# Patient Record
Sex: Male | Born: 1940
Health system: Southern US, Community
[De-identification: ages and names within clinical notes are randomized; demographics above are authoritative.]

## PROBLEM LIST (undated history)

## (undated) DIAGNOSIS — N200 Calculus of kidney: Secondary | ICD-10-CM

## (undated) DIAGNOSIS — I10 Essential (primary) hypertension: Secondary | ICD-10-CM

## (undated) DIAGNOSIS — E669 Obesity, unspecified: Secondary | ICD-10-CM

## (undated) DIAGNOSIS — M545 Low back pain, unspecified: Secondary | ICD-10-CM

## (undated) DIAGNOSIS — C649 Malignant neoplasm of unspecified kidney, except renal pelvis: Secondary | ICD-10-CM

## (undated) DIAGNOSIS — I454 Nonspecific intraventricular block: Secondary | ICD-10-CM

## (undated) DIAGNOSIS — D509 Iron deficiency anemia, unspecified: Secondary | ICD-10-CM

## (undated) DIAGNOSIS — I219 Acute myocardial infarction, unspecified: Secondary | ICD-10-CM

## (undated) DIAGNOSIS — Z860101 Personal history of adenomatous and serrated colon polyps: Secondary | ICD-10-CM

## (undated) DIAGNOSIS — J61 Pneumoconiosis due to asbestos and other mineral fibers: Secondary | ICD-10-CM

## (undated) DIAGNOSIS — F419 Anxiety disorder, unspecified: Secondary | ICD-10-CM

## (undated) DIAGNOSIS — E119 Type 2 diabetes mellitus without complications: Secondary | ICD-10-CM

## (undated) DIAGNOSIS — M5431 Sciatica, right side: Secondary | ICD-10-CM

## (undated) DIAGNOSIS — E785 Hyperlipidemia, unspecified: Secondary | ICD-10-CM

## (undated) DIAGNOSIS — I2510 Atherosclerotic heart disease of native coronary artery without angina pectoris: Secondary | ICD-10-CM

## (undated) DIAGNOSIS — M109 Gout, unspecified: Secondary | ICD-10-CM

## (undated) DIAGNOSIS — Z86010 Personal history of colonic polyps: Secondary | ICD-10-CM

## (undated) DIAGNOSIS — I214 Non-ST elevation (NSTEMI) myocardial infarction: Secondary | ICD-10-CM

## (undated) HISTORY — DX: Atherosclerotic heart disease of native coronary artery without angina pectoris: I25.10

## (undated) HISTORY — DX: Non-ST elevation (NSTEMI) myocardial infarction: I21.4

## (undated) HISTORY — PX: TONSILLECTOMY: SUR1361

## (undated) HISTORY — PX: APPENDECTOMY: SHX54

## (undated) HISTORY — DX: Pneumoconiosis due to asbestos and other mineral fibers: J61

## (undated) HISTORY — DX: Calculus of kidney: N20.0

## (undated) HISTORY — PX: BACK SURGERY: SHX140

## (undated) HISTORY — DX: Hyperlipidemia, unspecified: E78.5

## (undated) HISTORY — DX: Personal history of colonic polyps: Z86.010

## (undated) HISTORY — PX: LUMBAR DISC SURGERY: SHX700

## (undated) HISTORY — DX: Essential (primary) hypertension: I10

## (undated) HISTORY — DX: Low back pain: M54.5

## (undated) HISTORY — DX: Obesity, unspecified: E66.9

## (undated) HISTORY — DX: Iron deficiency anemia, unspecified: D50.9

## (undated) HISTORY — DX: Personal history of adenomatous and serrated colon polyps: Z86.0101

## (undated) HISTORY — PX: EYE SURGERY: SHX253

## (undated) HISTORY — PX: CHOLECYSTECTOMY: SHX55

## (undated) HISTORY — DX: Low back pain, unspecified: M54.50

---

## 1993-06-15 HISTORY — PX: TUMOR EXCISION: SHX421

## 1996-06-15 DIAGNOSIS — C649 Malignant neoplasm of unspecified kidney, except renal pelvis: Secondary | ICD-10-CM

## 1996-06-15 HISTORY — DX: Malignant neoplasm of unspecified kidney, except renal pelvis: C64.9

## 1996-06-15 HISTORY — PX: RETINAL DETACHMENT SURGERY: SHX105

## 2001-09-27 ENCOUNTER — Encounter: Payer: Self-pay | Admitting: Family Medicine

## 2001-09-27 ENCOUNTER — Ambulatory Visit (HOSPITAL_COMMUNITY): Admission: RE | Admit: 2001-09-27 | Discharge: 2001-09-27 | Payer: Self-pay | Admitting: Family Medicine

## 2002-03-07 ENCOUNTER — Ambulatory Visit (HOSPITAL_COMMUNITY): Admission: RE | Admit: 2002-03-07 | Discharge: 2002-03-07 | Payer: Self-pay | Admitting: General Surgery

## 2002-06-15 DIAGNOSIS — I219 Acute myocardial infarction, unspecified: Secondary | ICD-10-CM

## 2002-06-15 HISTORY — DX: Acute myocardial infarction, unspecified: I21.9

## 2002-08-21 ENCOUNTER — Encounter: Payer: Self-pay | Admitting: Emergency Medicine

## 2002-08-21 ENCOUNTER — Inpatient Hospital Stay (HOSPITAL_COMMUNITY): Admission: EM | Admit: 2002-08-21 | Discharge: 2002-08-25 | Payer: Self-pay | Admitting: Cardiology

## 2002-08-22 ENCOUNTER — Encounter: Payer: Self-pay | Admitting: Cardiology

## 2002-09-19 ENCOUNTER — Encounter (HOSPITAL_COMMUNITY): Admission: RE | Admit: 2002-09-19 | Discharge: 2002-10-19 | Payer: Self-pay | Admitting: Oncology

## 2002-09-19 ENCOUNTER — Encounter: Admission: RE | Admit: 2002-09-19 | Discharge: 2002-09-19 | Payer: Self-pay | Admitting: Oncology

## 2003-07-03 ENCOUNTER — Ambulatory Visit (HOSPITAL_COMMUNITY): Admission: RE | Admit: 2003-07-03 | Discharge: 2003-07-03 | Payer: Self-pay | Admitting: Cardiology

## 2004-01-15 ENCOUNTER — Observation Stay (HOSPITAL_COMMUNITY): Admission: RE | Admit: 2004-01-15 | Discharge: 2004-01-16 | Payer: Self-pay | Admitting: *Deleted

## 2004-03-14 ENCOUNTER — Inpatient Hospital Stay (HOSPITAL_COMMUNITY): Admission: EM | Admit: 2004-03-14 | Discharge: 2004-03-17 | Payer: Self-pay | Admitting: Emergency Medicine

## 2004-03-19 ENCOUNTER — Encounter (HOSPITAL_COMMUNITY): Admission: RE | Admit: 2004-03-19 | Discharge: 2004-03-19 | Payer: Self-pay | Admitting: Family Medicine

## 2004-03-26 ENCOUNTER — Ambulatory Visit (HOSPITAL_COMMUNITY): Admission: RE | Admit: 2004-03-26 | Discharge: 2004-03-26 | Payer: Self-pay | Admitting: General Surgery

## 2004-08-25 ENCOUNTER — Ambulatory Visit (HOSPITAL_COMMUNITY): Admission: RE | Admit: 2004-08-25 | Discharge: 2004-08-25 | Payer: Self-pay | Admitting: Orthopedic Surgery

## 2004-09-04 ENCOUNTER — Encounter: Admission: RE | Admit: 2004-09-04 | Discharge: 2004-09-04 | Payer: Self-pay | Admitting: Orthopedic Surgery

## 2004-10-27 ENCOUNTER — Observation Stay (HOSPITAL_COMMUNITY): Admission: RE | Admit: 2004-10-27 | Discharge: 2004-10-29 | Payer: Self-pay | Admitting: Orthopedic Surgery

## 2005-07-02 ENCOUNTER — Ambulatory Visit: Payer: Self-pay | Admitting: *Deleted

## 2005-07-09 ENCOUNTER — Ambulatory Visit: Payer: Self-pay | Admitting: *Deleted

## 2005-07-09 ENCOUNTER — Encounter (HOSPITAL_COMMUNITY): Admission: RE | Admit: 2005-07-09 | Discharge: 2005-08-08 | Payer: Self-pay | Admitting: *Deleted

## 2005-07-14 ENCOUNTER — Ambulatory Visit: Payer: Self-pay | Admitting: *Deleted

## 2007-01-19 ENCOUNTER — Ambulatory Visit: Payer: Self-pay | Admitting: Cardiology

## 2007-04-28 ENCOUNTER — Ambulatory Visit: Payer: Self-pay | Admitting: Cardiology

## 2007-05-02 ENCOUNTER — Ambulatory Visit: Payer: Self-pay | Admitting: Internal Medicine

## 2007-05-02 ENCOUNTER — Encounter (HOSPITAL_COMMUNITY): Admission: RE | Admit: 2007-05-02 | Discharge: 2007-06-01 | Payer: Self-pay | Admitting: Cardiology

## 2007-06-16 HISTORY — PX: COLONOSCOPY: SHX174

## 2007-06-16 HISTORY — PX: HOLMIUM LASER APPLICATION: SHX5852

## 2007-06-16 HISTORY — PX: CYSTOSCOPY W/ LITHOLAPAXY / EHL: SUR377

## 2007-08-23 ENCOUNTER — Ambulatory Visit (HOSPITAL_COMMUNITY): Admission: RE | Admit: 2007-08-23 | Discharge: 2007-08-23 | Payer: Self-pay | Admitting: General Surgery

## 2007-11-08 ENCOUNTER — Ambulatory Visit: Payer: Self-pay | Admitting: Internal Medicine

## 2007-11-22 ENCOUNTER — Ambulatory Visit (HOSPITAL_COMMUNITY): Admission: RE | Admit: 2007-11-22 | Discharge: 2007-11-22 | Payer: Self-pay | Admitting: Pulmonary Disease

## 2008-01-02 ENCOUNTER — Observation Stay (HOSPITAL_COMMUNITY): Admission: AD | Admit: 2008-01-02 | Discharge: 2008-01-03 | Payer: Self-pay | Admitting: Family Medicine

## 2008-01-02 ENCOUNTER — Ambulatory Visit (HOSPITAL_COMMUNITY): Admission: RE | Admit: 2008-01-02 | Discharge: 2008-01-02 | Payer: Self-pay | Admitting: Family Medicine

## 2008-01-11 ENCOUNTER — Ambulatory Visit (HOSPITAL_COMMUNITY): Admission: RE | Admit: 2008-01-11 | Discharge: 2008-01-11 | Payer: Self-pay | Admitting: Urology

## 2008-01-13 ENCOUNTER — Ambulatory Visit (HOSPITAL_COMMUNITY): Admission: RE | Admit: 2008-01-13 | Discharge: 2008-01-13 | Payer: Self-pay | Admitting: Urology

## 2008-01-19 ENCOUNTER — Observation Stay (HOSPITAL_COMMUNITY): Admission: EM | Admit: 2008-01-19 | Discharge: 2008-01-21 | Payer: Self-pay | Admitting: Emergency Medicine

## 2008-02-08 ENCOUNTER — Ambulatory Visit (HOSPITAL_COMMUNITY): Admission: RE | Admit: 2008-02-08 | Discharge: 2008-02-08 | Payer: Self-pay | Admitting: Urology

## 2008-07-16 ENCOUNTER — Encounter: Admission: RE | Admit: 2008-07-16 | Discharge: 2008-07-16 | Payer: Self-pay | Admitting: Orthopedic Surgery

## 2008-10-21 ENCOUNTER — Inpatient Hospital Stay (HOSPITAL_COMMUNITY): Admission: EM | Admit: 2008-10-21 | Discharge: 2008-10-23 | Payer: Self-pay | Admitting: Emergency Medicine

## 2008-10-23 ENCOUNTER — Encounter (INDEPENDENT_AMBULATORY_CARE_PROVIDER_SITE_OTHER): Payer: Self-pay | Admitting: Urology

## 2008-10-27 ENCOUNTER — Emergency Department (HOSPITAL_COMMUNITY): Admission: EM | Admit: 2008-10-27 | Discharge: 2008-10-28 | Payer: Self-pay | Admitting: Emergency Medicine

## 2008-10-31 DIAGNOSIS — M545 Low back pain, unspecified: Secondary | ICD-10-CM | POA: Insufficient documentation

## 2008-10-31 DIAGNOSIS — E785 Hyperlipidemia, unspecified: Secondary | ICD-10-CM

## 2008-11-02 ENCOUNTER — Ambulatory Visit: Payer: Self-pay | Admitting: Cardiology

## 2009-06-20 ENCOUNTER — Telehealth (INDEPENDENT_AMBULATORY_CARE_PROVIDER_SITE_OTHER): Payer: Self-pay | Admitting: *Deleted

## 2009-06-28 ENCOUNTER — Inpatient Hospital Stay (HOSPITAL_COMMUNITY): Admission: RE | Admit: 2009-06-28 | Discharge: 2009-06-30 | Payer: Self-pay | Admitting: Orthopedic Surgery

## 2009-08-15 ENCOUNTER — Encounter: Payer: Self-pay | Admitting: Cardiology

## 2009-08-27 ENCOUNTER — Ambulatory Visit: Payer: Self-pay | Admitting: Cardiology

## 2009-08-28 ENCOUNTER — Encounter: Payer: Self-pay | Admitting: Cardiology

## 2009-10-29 ENCOUNTER — Ambulatory Visit (HOSPITAL_COMMUNITY): Admission: RE | Admit: 2009-10-29 | Discharge: 2009-10-29 | Payer: Self-pay | Admitting: Family Medicine

## 2009-11-13 HISTORY — PX: COLONOSCOPY: SHX174

## 2009-11-13 HISTORY — PX: ESOPHAGOGASTRODUODENOSCOPY: SHX1529

## 2009-11-14 ENCOUNTER — Ambulatory Visit: Payer: Self-pay | Admitting: Internal Medicine

## 2009-11-14 DIAGNOSIS — R634 Abnormal weight loss: Secondary | ICD-10-CM

## 2009-11-14 DIAGNOSIS — D509 Iron deficiency anemia, unspecified: Secondary | ICD-10-CM

## 2009-11-14 DIAGNOSIS — R630 Anorexia: Secondary | ICD-10-CM

## 2009-11-14 DIAGNOSIS — J61 Pneumoconiosis due to asbestos and other mineral fibers: Secondary | ICD-10-CM | POA: Insufficient documentation

## 2009-11-21 ENCOUNTER — Ambulatory Visit: Payer: Self-pay | Admitting: Internal Medicine

## 2009-11-21 ENCOUNTER — Ambulatory Visit (HOSPITAL_COMMUNITY): Admission: RE | Admit: 2009-11-21 | Discharge: 2009-11-21 | Payer: Self-pay | Admitting: Internal Medicine

## 2009-11-28 ENCOUNTER — Encounter: Payer: Self-pay | Admitting: Urgent Care

## 2009-12-02 ENCOUNTER — Encounter: Payer: Self-pay | Admitting: Internal Medicine

## 2009-12-03 ENCOUNTER — Encounter: Payer: Self-pay | Admitting: Internal Medicine

## 2010-02-18 ENCOUNTER — Encounter (INDEPENDENT_AMBULATORY_CARE_PROVIDER_SITE_OTHER): Payer: Self-pay | Admitting: *Deleted

## 2010-03-04 ENCOUNTER — Ambulatory Visit: Payer: Self-pay | Admitting: Cardiology

## 2010-03-04 DIAGNOSIS — F17200 Nicotine dependence, unspecified, uncomplicated: Secondary | ICD-10-CM

## 2010-03-04 DIAGNOSIS — I4510 Unspecified right bundle-branch block: Secondary | ICD-10-CM

## 2010-05-15 ENCOUNTER — Encounter: Admission: RE | Admit: 2010-05-15 | Discharge: 2010-05-15 | Payer: Self-pay | Admitting: Orthopedic Surgery

## 2010-07-05 ENCOUNTER — Encounter: Payer: Self-pay | Admitting: *Deleted

## 2010-07-17 NOTE — Assessment & Plan Note (Signed)
Summary: rov. gd   Visit Type:  rov Primary Provider:  [name] MD  CC:  pt had back surgery in Jan....Marland Kitchenno cardiac complaints today..pt up 12 lb since last May.  History of Present Illness: Francisco Powell returns for evaluation and management of his coronary artery disease equivalent.   He is having no angina or ischemic symptoms. He's had back surgery and has been more active than usual.  His weight is stable. His diabetes and his hyperlipidemia are followed by his primary care.  His blood pressure good control. Last visit we stop his HCTZ because of kidney stones. He has not had any more stones. He takes magnesium.  Problems sleeping at night. His back incision has given him a lot of difficulty. He would like something to help him sleep.  Current Medications (verified): 1)  Metformin Hcl 1000 Mg Tabs (Metformin Hcl) .Marland Kitchen.. 1 Tab Two Times A Day 2)  Glipizide Xl 10 Mg Xr24h-Tab (Glipizide) .Marland Kitchen.. 1 Tab Two Times A Day 3)  Amlodipine Besylate 10 Mg Tabs (Amlodipine Besylate) .Marland Kitchen.. 1 Tab Once Daily 4)  Lisinopril 10 Mg Tabs (Lisinopril) .Marland Kitchen.. 1 Tab Once Daily 5)  Simvastatin 40 Mg Tabs (Simvastatin) .Marland Kitchen.. 1 Tab Once Daily 6)  Lantus 100 Unit/ml Soln (Insulin Glargine) .... As Directed 7)  Aspirin 81 Mg Tbec (Aspirin) .... Take One Tablet By Mouth Daily 8)  Magnesium Oxide 400 Mg Caps (Magnesium Oxide) .Marland Kitchen.. 1 Cap Once Daily  Allergies: 1)  ! Lipitor (Atorvastatin)  Past History:  Past Medical History: Last updated: 08/23/2009 CAD, NATIVE VESSEL (ICD-414.01) HYPERTENSION, UNSPECIFIED (ICD-401.9) HYPERLIPIDEMIA-MIXED (ICD-272.4) DM (ICD-250.00) OBESITY (ICD-278.00) BACK PAIN, LUMBAR (ICD-724.2)    Past Surgical History: Last updated: 10/31/2008 Holmium laser lithotripsy in 2009 Cholecystectomy Eye surgery.  Back surgery. Recurrent urolithiasis status post ESL  Family History: Last updated: 10/31/2008 Family History of Cancer:  Family History of Coronary Artery Disease:    Family History of Diabetes:  Family History of Hyperlipidemia:   Social History: Last updated: 10/31/2008 Tobacco Use - No.  Alcohol Use - no Drug Use - no  Risk Factors: Smoking Status: never (10/31/2008)  Review of Systems       negative other than history of present illness  Vital Signs:  Patient profile:   70 year old male Height:      74 inches Weight:      282 pounds BMI:     36.34 Pulse rate:   72 / minute Pulse rhythm:   regular BP sitting:   130 / 80  (left arm) Cuff size:   large  Vitals Entered By: Danielle Rankin, CMA (August 27, 2009 10:01 AM)  Physical Exam  General:  overweight but seems to have lost some Head:  normocephalic and atraumatic Mouth:  Teeth, gums and palate normal. Oral mucosa normal. Neck:  Neck supple, no JVD. No masses, thyromegaly or abnormal cervical nodes. Lungs:  Clear bilaterally to auscultation and percussion. Heart:  PMI heart appreciate, normal S1-S2, no murmur regular rate and rhythm Msk:  Back normal, normal gait. Muscle strength and tone normal. Pulses:  pulses normal in all 4 extremities Extremities:  No clubbing or cyanosis. Neurologic:  Alert and oriented x 3. Skin:  Intact without lesions or rashes. Psych:  Normal affect.   Impression & Recommendations:  Problem # 1:  CAD, NATIVE VESSEL (ICD-414.01) Assessment Unchanged  His updated medication list for this problem includes:    Amlodipine Besylate 10 Mg Tabs (Amlodipine besylate) .Marland Kitchen... 1 tab once daily  Lisinopril 10 Mg Tabs (Lisinopril) .Marland Kitchen... 1 tab once daily    Aspirin 81 Mg Tbec (Aspirin) .Marland Kitchen... Take one tablet by mouth daily  Problem # 2:  HYPERTENSION, UNSPECIFIED (ICD-401.9) Assessment: Improved  The following medications were removed from the medication list:    Hydrochlorothiazide 12.5 Mg Tabs (Hydrochlorothiazide) .Marland Kitchen... 1 tab every morning His updated medication list for this problem includes:    Amlodipine Besylate 10 Mg Tabs (Amlodipine besylate)  .Marland Kitchen... 1 tab once daily    Lisinopril 10 Mg Tabs (Lisinopril) .Marland Kitchen... 1 tab once daily    Aspirin 81 Mg Tbec (Aspirin) .Marland Kitchen... Take one tablet by mouth daily  Problem # 3:  HYPERLIPIDEMIA-MIXED (ICD-272.4)  His updated medication list for this problem includes:    Simvastatin 40 Mg Tabs (Simvastatin) .Marland Kitchen... 1 tab once daily  His updated medication list for this problem includes:    Simvastatin 40 Mg Tabs (Simvastatin) .Marland Kitchen... 1 tab once daily  Problem # 4:  DM (ICD-250.00)  His updated medication list for this problem includes:    Metformin Hcl 1000 Mg Tabs (Metformin hcl) .Marland Kitchen... 1 tab two times a day    Glipizide Xl 10 Mg Xr24h-tab (Glipizide) .Marland Kitchen... 1 tab two times a day    Lisinopril 10 Mg Tabs (Lisinopril) .Marland Kitchen... 1 tab once daily    Lantus 100 Unit/ml Soln (Insulin glargine) .Marland Kitchen... As directed    Aspirin 81 Mg Tbec (Aspirin) .Marland Kitchen... Take one tablet by mouth daily  His updated medication list for this problem includes:    Metformin Hcl 1000 Mg Tabs (Metformin hcl) .Marland Kitchen... 1 tab two times a day    Glipizide Xl 10 Mg Xr24h-tab (Glipizide) .Marland Kitchen... 1 tab two times a day    Lisinopril 10 Mg Tabs (Lisinopril) .Marland Kitchen... 1 tab once daily    Lantus 100 Unit/ml Soln (Insulin glargine) .Marland Kitchen... As directed    Aspirin 81 Mg Tbec (Aspirin) .Marland Kitchen... Take one tablet by mouth daily  Patient Instructions: 1)  Your physician recommends that you schedule a follow-up appointment in: 6 months with dr Shamar Engelmann 2)  Your physician recommends that you continue on your current medications as directed. Please refer to the Current Medication list given to you today. 3)  ZOLPIDEM TART 10 MG AT BEDTIME  Prescriptions: ZOLPIDEM TARTRATE 10 MG TABS (ZOLPIDEM TARTRATE) 1  at bedtime as needed  #30 x 1   Entered by:   Scherrie Bateman, LPN   Authorized by:   Gaylord Shih, MD, [facility]   Signed by:   Scherrie Bateman, LPN on 16/03/9603   Method used:   Print then Give to Patient   RxID:   [id number]

## 2010-07-17 NOTE — Letter (Signed)
Summary: TCS/EGD ORDER  TCS/EGD ORDER   Imported By: Ave Filter 11/14/2009 10:59:45  _____________________________________________________________________  External Attachment:    Type:   Image     Comment:   External Document

## 2010-07-17 NOTE — Progress Notes (Signed)
  Phone Note From Other Clinic   Caller: Pat/[facility] Details for Reason: Records Details of Action Taken: Faxed  Initial call taken by: Denny Peon    FAxed over to [phone], Pat @ [facility]  [facility]  June 20, 2009 1:51 PM

## 2010-07-17 NOTE — Assessment & Plan Note (Signed)
Summary: colitis,anemia,consult for tcs and egd/ss   Visit Type:  Initial Consult Referring Provider:  Dr. [name] Primary Care Provider:  Dr [name]  Chief Complaint:  weak, tired, anemic, and cant eat.  History of Present Illness: 70 y/o caucasian male referred by Dr Gerda Diss for IDA.  Approx 3 weeks ago, developed acute diarrheal illness and was told hgb 9 grams.  Started iron pills x 1 week.   6 day hx diarrhea w/ watery diarrhea TNTC, non-bloody.  CT abd/pelvis IV contrast 10/29/09-> ?mild thickening sig colon wall and accentuated vasa rectua vasculature.  Was treated w/?antibiotic x 1 week for UTI per  Dr Fletcher Anon office.  Pt was told he was heme negative.  Was taking imodium which helped diarrhea.  BM once daily now, formed.  Denies N/V/ abd pain.  Denies NSAIDs.  c/o anorexia and weakness.  Blood sugars ok. Using gatorade.  Weight loss 46# in 6 months, 9# in last 2 weeks.  Denies fever.  Did have HA.  Labs 11/12/09->iron 41, ferritin 12.  Hgb 11.3, esr 20, hgb a1c 9.3. Met 7 normal x glucose 253, LFTs normal  Current Problems (verified): 1)  Anemia, Iron Deficiency  (ICD-280.9) 2)  Weight Loss, Abnormal  (ICD-783.21) 3)  Anorexia  (ICD-783.0) 4)  Pulmonary Asbestosis  (ICD-501) 5)  Cad, Native Vessel  (ICD-414.01) 6)  Hypertension, Unspecified  (ICD-401.9) 7)  Hyperlipidemia-mixed  (ICD-272.4) 8)  Dm  (ICD-250.00) 9)  Obesity  (ICD-278.00) 10)  Back Pain, Lumbar  (ICD-724.2)  Current Medications (verified): 1)  Metformin Hcl 1000 Mg Tabs (Metformin Hcl) .Marland Kitchen.. 1 Tab Two Times A Day 2)  Glipizide Xl 10 Mg Xr24h-Tab (Glipizide) .Marland Kitchen.. 1 Tab Two Times A Day 3)  Amlodipine Besylate 10 Mg Tabs (Amlodipine Besylate) .Marland Kitchen.. 1 Tab Once Daily 4)  Lisinopril 10 Mg Tabs (Lisinopril) .Marland Kitchen.. 1 Tab Once Daily 5)  Simvastatin 40 Mg Tabs (Simvastatin) .Marland Kitchen.. 1 Tab Once Daily 6)  Lantus 100 Unit/ml Soln (Insulin Glargine) .... As Directed  Allergies (verified): 1)  ! Lipitor  (Atorvastatin)  Past History:  Past Medical History: CAD, NATIVE VESSEL (ICD-414.01) HYPERTENSION, UNSPECIFIED (ICD-401.9) HYPERLIPIDEMIA-MIXED (ICD-272.4) DM (ICD-250.00) OBESITY (ICD-278.00) BACK PAIN, LUMBAR (ICD-724.2) renal lithiasis asbestosis lung hx colon adenomatous polyps on 2 colonoscopies in 1990s Dr Rehman/Jenkins [place]  ANEMIA, IRON DEFICIENCY (ICD-280.9)  Past Surgical History: Holmium laser lithotripsy in 2009 Cholecystectomy sec biliary dyskinesia Eye surgery secondary to injury detached retina/cataracts Back surgery. Recurrent urolithiasis status post ESL Appendectomy  Family History: Mother dx colon ca age 14 Maternal grandmother gastric ca age 76 sister breast Ca  Social History: Tobacco Use - yes. 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 CV:  Denies chest pains, angina, palpitations, syncope, dyspnea on exertion, orthopnea, PND, peripheral edema, and claudication. Resp:  Denies dyspnea at rest, dyspnea with exercise, cough, sputum, wheezing, coughing up blood, and pleurisy. GI:  See HPI; Denies jaundice, bloody BM's, black BMs, and fecal incontinence. GU:  Denies urinary burning, blood in urine, urinary frequency, urinary hesitancy, nocturnal urination, and urinary incontinence. Derm:  Denies rash, itching, dry skin, hives, moles, warts, and unhealing ulcers. Psych:  Denies depression, anxiety, memory loss, suicidal ideation, hallucinations, paranoia, phobia, and confusion. Heme:  Denies bruising, bleeding, and enlarged lymph nodes.  Vital Signs:  Patient profile:  70 year old male Height:      74 inches Weight:      274 pounds BMI:     35.31 Temp:     97.6 degrees F oral Pulse rate:   80 / minute BP sitting:   128 / 72  (left  arm) Cuff size:   large  Vitals Entered By: Hendricks Limes LPN (November 15, 5407 10:01 AM)  Physical Exam  General:  obese.  Well developed, no acute distress. Accompanied by wife and daughter. Head:  Normocephalic and atraumatic. Eyes:  Sclera clear, no icterus. Ears:  Normal auditory acuity. Nose:  No deformity, discharge,  or lesions. Mouth:  No deformity or lesions, dentition normal. Neck:  Supple; no masses or thyromegaly. Lungs:  Clear throughout to auscultation. Heart:  Regular rate and rhythm; no murmurs, rubs,  or bruits. Abdomen:  Obese Soft, nontender and nondistended. No masses, hepatosplenomegaly or hernias noted. Normal bowel sounds.without guarding and without rebound.   Exam limited given pt body habitus. Rectal:  deferred until time of colonoscopy.   Msk:  Symmetrical with no gross deformities. Normal posture. Pulses:  Normal pulses noted. Extremities:  trace pedal edema.   Neurologic:  Alert and  oriented x4;  grossly normal neurologically. Skin:  Intact without significant lesions or rashes. Cervical Nodes:  No significant cervical adenopathy. Inguinal Nodes:  No significant inguinal adenopathy. Psych:  Alert and cooperative. Normal mood and affect.  Impression & Recommendations:  Problem # 1:  ANEMIA, IRON DEFICIENCY (ICD-280.9) 70 y/o caucasian male recently found to have iron deficiency anemia on bloodwork during evaluation for acute non-bloody diarrheal illness ?viral vs. bacterial.  Nonetheless, diarrhea has resolved.  CT showed thickening sigmoid colon.  Weight loss and anemia is concerning.  We do need to r/o colorectal carcinoma, polyps, inflammatory bowel disease.  Family hx positive for CRCA and gastric CA.  He also has early satiety,anorexia, and significant weight loss, therefore, he will undergo EGD at the same time as colonoscopy to r/o PUD, gastritis, & malignancy.  Diagnostic colonoscopy and EGD to be performed by Dr. Suszanne Conners Rourk in the near future.   I have discussed risks and benefits which include, but are not limited to, bleeding, infection, perforation, or medication reaction.  The patient agrees with this plan and consent will be obtained.  Orders: Consultation Level III (81191)  Problem # 2:  ANOREXIA (ICD-783.0) See #1  Problem # 3:  WEIGHT LOSS, ABNORMAL (ICD-783.21) See #1  Problem # 4:  DM (ICD-250.00) Meds to be adjusted for procedures  Problem # 5:  COLITIS, HX OF (ICD-V12.79) See #1  Patient Instructions: 1)  Take 40 untis of lantus night before procedure 2)  Check blood sugars frequently day before procedure, if any problems, call us or Dr Gerda Diss 3)  Hold diabetes pills day of procedure, may resume after procedure 4)  Hold iron for 1 week before procedure 5)  Low fat/low chol high iron diet 6)  Begin multivitamin daily 7)  Call if any problems in interim 8)  The medication list was reviewed and reconciled.  All changed / newly prescribed medications were explained.  A complete medication list was provided to the patient / caregiver.

## 2010-07-17 NOTE — Letter (Signed)
Summary: Recall Office Visit  [facility]  [address]   [place], [place] [address]   Phone: [phone]  Fax: [phone]      February 18, 2010   Francisco Powell [address] [place], [place]  [address] 1940/10/03   Dear [name],   According to our records, it is time for you to schedule a follow-up office visit with [place].   At your convenience, please call [phone] to schedule an office visit. If you have any questions, concerns, or feel that this letter is in error, we would appreciate your call.   Sincerely,    Diana Eves  [facility] Ph: [phone]   Fax: [phone]

## 2010-07-17 NOTE — Assessment & Plan Note (Signed)
Summary: 6 mo f/u   Visit Type:  6 mo f/u Referring Provider:  Dr. [name] Primary Provider:  Dr [name]  CC:  right hip pain...denies any other complaints today.  History of Present Illness: Francisco Powell comes in today for followup.  Since I last saw him he had back surgery in January and has improved his back pain. He also had H. pylori gastritishas recovered after 6 weeks of antibiotics.  His blood sugars are still running high. His weight is increasing.  He denies any angina or ischemic symptoms.  His blood pressure 200/30 good control.  Blood work from Dr.Lukings office reviewed.  A low risk stress Myoview in 2008. He has subclinical coronary disease based on his diabetes.  Clinical Reports Reviewed:  Nuclear Study:  05/02/2007:    Clinical data:   Patient is a 70 year old with chest pain.  Test to   evaluate.   STRESS MYOVIEW STUDY:   Patient underwent Adenosine stress testing.  Baseline blood pressure   was 138/68.  Baseline EKG showed sinus bradycardia with occasional   PVC, 56 beats per minute.  Nonspecific ST changes.   With the infusion of Adenosine, the patient experienced no chest   pain.  EKG showed no ST changes to suggest ischemia.   NUCLEAR DATA:  Patient was studied in a 1 day rest/stress protocol.   He was injected with 10.0 mCi Tc81m Myoview at rest, 30.0 mCi Tc94m   Myoview at stress.  Images were reconstructed in the short, vertical,   and horizontal axes.   On the initial stress images, there was thinning with decreased   tracer counts mildly in the inferior wall (base mid distal),   inferolateral wall (base mid distal and apex).   Otherwise, there was normal perfusion.   In the recovery images, there was no significant change in this.   On gating, LVF was calculated at 51% with some septal hypokinesis.   Note the LV appeared to be somewhat dilated.   On review of the raw data, the inferior inferolateral changes most   likely reflect  soft tissue attenuation.  No evidence for inducible   ischemia or definite scar.   IMPRESSION:   Adenosine Myoview electrically negative for ischemia.  Myoview scan   with probable normal perfusion and soft tissue attenuation (diaphragm   inferior/inferolaterally).  No evidence of inducible ischemia.   Consider echocardiogram to evaluate chamber sizes and further define   ejection fraction.    Read By:  Pricilla Riffle,  M.D.   Released By:  Pricilla Riffle,  M.D.  07/04/2003:   Clinical data:   [name] is a 70 year old male with known   coronary disease, had a catheterization in March which showed   moderate disease which was nonobstructive and normal left ventricular   function.  He had an adenosine Cardiolite after that which showed no   ischemia.  He now returns with an episode of discomfort in his chest   which was relieved by nitroglycerin.   MYOCARDIAL PERFUSION IMAGING STUDY   20.0 mCi Tc30m Cardiolite was injected at rest.  30.0 mCi Tc7m was   injected at peak pharmacologic stress.  60 mg of Adenosine was   infused over a four minute protocol.   DETAILS OF THE PROCEDURE:  The patient underwent an infusion of   adenosine and at peak pharmacologic stress, high dose Technetium 76m   Cardiolite was infused.  SPECT imaging of the chest was performed.  The patient then was discharged to home and came back 24 hours later   and had low dose Technetium 40m Cardiolite infused.  SPECT imaging of   the chest was performed.  The images were recreated in the short   axis, horizontal long, and vertical long axes, and the post stress   images were used in a gating protocol to assess left ventricular   size, wall motion, and function.   RESULTS:  There is uniform perfusion throughout all myocardial   segments.  There is mild diaphragmatic attenuation.  The overall   ejection fraction is 57%.  There is no ischemia seen.  There are no   wall motion abnormalities seen.   STRESS TEST:  The  patient was infused with adenosine over a 4 minute   protocol.  His heart rate went from 62 beats a minute to 77 beats a   minute.  His blood pressure 132/80 to 140/78.  During that time, he   experienced chest tightness, flushing, which resolved with the   cessation of the infusion.  He had no arrhythmia or ST-T wave changes   concerning for ischemia.   IMPRESSION   This is a low risk scan in a patient with known coronary artery   disease.  Clinical correlation is advised.   CC:  Dr. [name]    Read By:  Vida Roller,   Current Medications (verified): 1)  Metformin Hcl 1000 Mg Tabs (Metformin Hcl) .Marland Kitchen.. 1 Tab Two Times A Day 2)  Glipizide Xl 10 Mg Xr24h-Tab (Glipizide) .Marland Kitchen.. 1 Tab Two Times A Day 3)  Amlodipine Besylate 10 Mg Tabs (Amlodipine Besylate) .Marland Kitchen.. 1 Tab Once Daily 4)  Lisinopril 10 Mg Tabs (Lisinopril) .Marland Kitchen.. 1 Tab Once Daily 5)  Pravastatin Sodium 40 Mg Tabs (Pravastatin Sodium) .Marland Kitchen.. 1 Once Daily 6)  Lantus 100 Unit/ml Soln (Insulin Glargine) .... As Directed 7)  Aspirin 81 Mg Tbec (Aspirin) .Marland Kitchen.. 1 Once Daily  Allergies: 1)  ! Lipitor (Atorvastatin)  Past History:  Past Medical History: Last updated: 11/14/2009 CAD, NATIVE VESSEL (ICD-414.01) HYPERTENSION, UNSPECIFIED (ICD-401.9) HYPERLIPIDEMIA-MIXED (ICD-272.4) DM (ICD-250.00) OBESITY (ICD-278.00) BACK PAIN, LUMBAR (ICD-724.2) renal lithiasis asbestosis lung hx colon adenomatous polyps on 2 colonoscopies in 1990s Dr Rehman/Jenkins [place]  ANEMIA, IRON DEFICIENCY (ICD-280.9)  Past Surgical History: Last updated: 11/14/2009 Holmium laser lithotripsy in 2009 Cholecystectomy sec biliary dyskinesia Eye surgery secondary to injury detached retina/cataracts Back surgery. Recurrent urolithiasis status post ESL Appendectomy  Family History: Last updated: 11/14/2009 Mother dx colon ca age 61 Maternal grandmother gastric ca age 62 sister breast Ca  Social History: Last updated: 11/14/2009 Tobacco Use -  yes. 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 Neck:  Neck supple, no JVD. No  masses, thyromegaly or abnormal cervical nodes. Chest Wall:  no deformities or breast masses noted Lungs:  Clear bilaterally to auscultation and percussion. Heart:  PMI poorly appreciated, normal S1-S2, no murmur. Retrocecal bilateral bruits Msk:  Back normal, normal gait. Muscle strength and tone normal. Pulses:  pulses normal in all 4 extremities Extremities:  No clubbing or cyanosis. Neurologic:  Alert and oriented x 3. Skin:  Intact without lesions or rashes. Psych:  Normal affect.   Problems:  Medical Problems Added: 1)  Dx of Rbbb  (ICD-426.4) 2)  Dx of Smokeless Tobacco Abuse  (ICD-305.1)  EKG  Procedure date:  03/04/2010  Findings:      normal sinus rhythm, right bundle branch block, no acute changes.  Impression & Recommendations:  Problem # 1:  CAD, NATIVE VESSEL (ICD-414.01) Assessment Unchanged  His updated medication list for this problem includes:    Amlodipine Besylate 10 Mg Tabs (Amlodipine besylate) .Marland Kitchen... 1 tab once daily    Lisinopril 10 Mg Tabs (Lisinopril) .Marland Kitchen... 1 tab once daily    Aspirin 81 Mg Tbec (Aspirin) .Marland Kitchen... 1 once daily  Problem # 2:  HYPERTENSION, UNSPECIFIED (ICD-401.9) Assessment: Improved  His updated medication list for  this problem includes:    Amlodipine Besylate 10 Mg Tabs (Amlodipine besylate) .Marland Kitchen... 1 tab once daily    Lisinopril 10 Mg Tabs (Lisinopril) .Marland Kitchen... 1 tab once daily    Aspirin 81 Mg Tbec (Aspirin) .Marland Kitchen... 1 once daily  Orders: EKG w/ Interpretation (93000)  Problem # 3:  HYPERLIPIDEMIA-MIXED (ICD-272.4)  His updated medication list for this problem includes:    Pravastatin Sodium 40 Mg Tabs (Pravastatin sodium) .Marland Kitchen... 1 once daily  Problem # 4:  OBESITY (ICD-278.00) Assessment: Deteriorated I have advised him to keep his weight down his sugar tight. This is his greatest risk 4 having health problems in the future. Wife is present he does the cooking. He agreed to cut his portions and her to cook more healthy.  Problem # 5:  DM (ICD-250.00)  His updated medication list for this problem includes:    Metformin Hcl 1000 Mg Tabs (Metformin hcl) .Marland Kitchen... 1 tab two times a day    Glipizide Xl 10 Mg Xr24h-tab (Glipizide) .Marland Kitchen... 1 tab two times a day    Lisinopril 10 Mg Tabs (Lisinopril) .Marland Kitchen... 1 tab once daily    Lantus 100 Unit/ml Soln (Insulin glargine) .Marland Kitchen... As directed    Aspirin 81 Mg Tbec (Aspirin) .Marland Kitchen... 1 once daily  Problem # 6:  BACK PAIN, LUMBAR (ICD-724.2) Assessment: Improved  Problem # 7:  SMOKELESS TOBACCO ABUSE (ICD-305.1) Assessment: Unchanged  advised to quit  Problem # 8:  RBBB (ICD-426.4)  His updated medication list for this problem includes:    Amlodipine Besylate 10 Mg Tabs (Amlodipine besylate) .Marland Kitchen... 1 tab once daily    Lisinopril 10 Mg Tabs (Lisinopril) .Marland Kitchen... 1 tab once daily    Aspirin 81 Mg Tbec (Aspirin) .Marland Kitchen... 1 once daily  His updated medication list for this problem includes:    Amlodipine Besylate 10 Mg Tabs (Amlodipine besylate) .Marland Kitchen... 1 tab once daily    Lisinopril 10 Mg Tabs (Lisinopril) .Marland Kitchen... 1 tab once daily    Aspirin 81 Mg Tbec (Aspirin) .Marland Kitchen... 1 once daily  Patient Instructions: 1)  Your physician recommends that you schedule a follow-up appointment in:  6 MONTHS WITH DR WALL 2)  Your physician recommends that you continue on your current medications as directed. Please refer to the Current Medication list given to you today.

## 2010-07-18 NOTE — Letter (Signed)
Summary: LABS  LABS   Imported By: Ave Filter 12/03/2009 11:06:54  _____________________________________________________________________  External Attachment:    Type:   Image     Comment:   External Document

## 2010-07-18 NOTE — Op Note (Signed)
Summary: TCS-DR Lovell Sheehan  TCS-DR JENKINS   Imported By: Ave Filter 11/28/2009 13:19:01  _____________________________________________________________________  External Attachment:    Type:   Image     Comment:   External Document

## 2010-08-18 ENCOUNTER — Encounter: Payer: Self-pay | Admitting: Cardiology

## 2010-08-18 ENCOUNTER — Ambulatory Visit (INDEPENDENT_AMBULATORY_CARE_PROVIDER_SITE_OTHER): Payer: Medicare Other | Admitting: Cardiology

## 2010-08-18 DIAGNOSIS — I2510 Atherosclerotic heart disease of native coronary artery without angina pectoris: Secondary | ICD-10-CM

## 2010-08-18 DIAGNOSIS — I10 Essential (primary) hypertension: Secondary | ICD-10-CM

## 2010-08-26 NOTE — Assessment & Plan Note (Signed)
Summary: 6 mo fu cy/ep   Visit Type:  6 mo follow up Referring Provider:  Dr. [name] Primary Provider:  Dr [name]  CC:  pt has no complaints today.  History of Present Illness: Francisco Powell returns for E and M of subclinical  coronary artery disease and HTN. He denies any angina or ischemic equivalents. He is having problems with his low back requiring injections He denies any sxs of CHF.  Current Medications (verified): 1)  Metformin Hcl 1000 Mg Tabs (Metformin Hcl) .Marland Kitchen.. 1 Tab Two Times A Day 2)  Glipizide Xl 10 Mg Xr24h-Tab (Glipizide) .Marland Kitchen.. 1 Tab Two Times A Day 3)  Amlodipine Besylate 10 Mg Tabs (Amlodipine Besylate) .Marland Kitchen.. 1 Tab Once Daily 4)  Lisinopril 10 Mg Tabs (Lisinopril) .Marland Kitchen.. 1 Tab Once Daily 5)  Pravastatin Sodium 40 Mg Tabs (Pravastatin Sodium) .Marland Kitchen.. 1 Once Daily 6)  Lantus 100 Unit/ml Soln (Insulin Glargine) .... As Directed 7)  Aspirin 81 Mg Tbec (Aspirin) .Marland Kitchen.. 1 Once Daily  Allergies (verified): 1)  ! Lipitor (Atorvastatin)  Past History:  Past Medical History: Last updated: 11/14/2009 CAD, NATIVE VESSEL (ICD-414.01) HYPERTENSION, UNSPECIFIED (ICD-401.9) HYPERLIPIDEMIA-MIXED (ICD-272.4) DM (ICD-250.00) OBESITY (ICD-278.00) BACK PAIN, LUMBAR (ICD-724.2) renal lithiasis asbestosis lung hx colon adenomatous polyps on 2 colonoscopies in 1990s Dr Rehman/Jenkins [place]  ANEMIA, IRON DEFICIENCY (ICD-280.9)  Past Surgical History: Last updated: 11/14/2009 Holmium laser lithotripsy in 2009 Cholecystectomy sec biliary dyskinesia Eye surgery secondary to injury detached retina/cataracts Back surgery. Recurrent urolithiasis status post ESL Appendectomy  Family History: Last updated: 11/14/2009 Mother dx colon ca age 44 Maternal grandmother gastric ca age 41 sister breast Ca  Social History: Last updated: 11/14/2009 Tobacco Use - yes. 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  obese.   Head:  normocephalic and atraumatic Eyes:  PERRLA/EOM intact; conjunctiva and lids normal. Neck:  Neck supple, no JVD. No masses, thyromegaly or abnormal cervical nodes. Chest Alvy Alsop:  no deformities or breast masses noted Lungs:  Clear bilaterally to auscultation and percussion. Heart:  RRR, Nl S1 and S2, no bruits Msk:  decreased ROM.  decreased ROM.   Pulses:  pulses normal in all 4 extremities Extremities:  No clubbing or cyanosis. Neurologic:  Alert and oriented x 3. Skin:  Intact without lesions or rashes. Psych:  Normal affect.   Impression & Recommendations:  Problem # 1:  RBBB (ICD-426.4) Assessment Unchanged  His updated medication list for this problem includes:    Amlodipine Besylate 10 Mg Tabs (Amlodipine besylate) .Marland Kitchen... 1 tab once daily    Lisinopril 20 Mg Tabs (Lisinopril) .Marland Kitchen... Take one tablet by mouth daily    Aspirin 81 Mg Tbec (Aspirin) .Marland Kitchen... 1 once daily  His updated medication list for this problem includes:    Amlodipine Besylate 10 Mg Tabs (Amlodipine besylate) .Marland KitchenMarland KitchenMarland KitchenMarland Kitchen  1 tab once daily    Lisinopril 20 Mg Tabs (Lisinopril) .Marland Kitchen... Take one tablet by mouth daily    Aspirin 81 Mg Tbec (Aspirin) .Marland Kitchen... 1 once daily  Problem # 2:  CAD, NATIVE VESSEL (ICD-414.01) Assessment: Unchanged  His updated medication list for this problem includes:    Amlodipine Besylate 10 Mg Tabs (Amlodipine besylate) .Marland Kitchen... 1 tab once daily    Lisinopril 20 Mg Tabs (Lisinopril) .Marland Kitchen... Take one tablet by mouth daily    Aspirin 81 Mg Tbec (Aspirin)  .Marland Kitchen... 1 once daily  Orders: EKG w/ Interpretation (93000)  Orders: EKG w/ Interpretation (93000)  Problem # 3:  HYPERTENSION, UNSPECIFIED (ICD-401.9) Assessment: Deteriorated Increase Lisinopril to 20mg /day. BMET in 1 week. His updated medication list for this problem includes:    Amlodipine Besylate 10 Mg Tabs (Amlodipine besylate) .Marland Kitchen... 1 tab once daily    Lisinopril 20 Mg Tabs (Lisinopril) .Marland Kitchen... Take one tablet by mouth daily    Aspirin 81 Mg Tbec (Aspirin) .Marland Kitchen... 1 once daily  Patient Instructions: 1)  Your physician recommends that you schedule a follow-up appointment in: 6 months 2)  Your physician has recommended you make the following change in your medication: Increase Lisinopril to 20 mg by mouth daily. 3)  Have lab work drawn in [place] on August 28, 2010--BMP. You have prescription for this Prescriptions: LISINOPRIL 20 MG TABS (LISINOPRIL) Take one tablet by mouth daily  #90 x 3   Entered by:   Dossie Arbour, RN, BSN   Authorized by:   Gaylord Shih, MD, [facility]   Signed by:   Dossie Arbour, RN, BSN on 08/18/2010   Method used:   Electronically to        [facility] (retail)       [address]       [place], [place]  [address]       Ph: [phone]       Fax: [phone]   RxID:   [id number]

## 2010-08-28 ENCOUNTER — Encounter: Payer: Self-pay | Admitting: Cardiology

## 2010-08-31 LAB — COMPREHENSIVE METABOLIC PANEL
ALT: 26 U/L (ref 0–53)
AST: 32 U/L (ref 0–37)
Albumin: 3.9 g/dL (ref 3.5–5.2)
Alkaline Phosphatase: 66 U/L (ref 39–117)
GFR calc Af Amer: >60 mL/min (ref >60)
Potassium: 4.1 mEq/L (ref 3.5–5.1)
Sodium: 142 mEq/L (ref 135–145)
Total Protein: 7.1 g/dL (ref 6.0–8.3)

## 2010-08-31 LAB — GLUCOSE, CAPILLARY
Glucose-Capillary: 117 mg/dL — ABNORMAL HIGH (ref 70–99)
Glucose-Capillary: 119 mg/dL — ABNORMAL HIGH (ref 70–99)
Glucose-Capillary: 130 mg/dL — ABNORMAL HIGH (ref 70–99)
Glucose-Capillary: 135 mg/dL — ABNORMAL HIGH (ref 70–99)
Glucose-Capillary: 152 mg/dL — ABNORMAL HIGH (ref 70–99)
Glucose-Capillary: 152 mg/dL — ABNORMAL HIGH (ref 70–99)
Glucose-Capillary: 157 mg/dL — ABNORMAL HIGH (ref 70–99)
Glucose-Capillary: 163 mg/dL — ABNORMAL HIGH (ref 70–99)
Glucose-Capillary: 174 mg/dL — ABNORMAL HIGH (ref 70–99)
Glucose-Capillary: 207 mg/dL — ABNORMAL HIGH (ref 70–99)
Glucose-Capillary: 192 mg/dL — ABNORMAL HIGH (ref 70–99)

## 2010-09-01 LAB — GLUCOSE, CAPILLARY: Glucose-Capillary: 160 mg/dL — ABNORMAL HIGH (ref 70–99)

## 2010-09-02 ENCOUNTER — Telehealth: Payer: Self-pay | Admitting: Cardiology

## 2010-09-02 LAB — CONVERTED CEMR LAB
BUN: 14 mg/dL (ref 6–23)
Calcium: 9.3 mg/dL (ref 8.4–10.5)
Glucose, Bld: 172 mg/dL — ABNORMAL HIGH (ref 70–99)
Potassium: 4.8 meq/L (ref 3.5–5.3)

## 2010-09-02 NOTE — Telephone Encounter (Signed)
Lab results given to wife.  Also aware results were faxed to pcp.

## 2010-09-23 LAB — DIFFERENTIAL
Basophils Relative: 1 % (ref 0–1)
Eosinophils Absolute: 0 10*3/uL (ref 0.0–0.7)
Eosinophils Relative: 0 % (ref 0–5)
Lymphocytes Relative: 29 % (ref 12–46)
Lymphs Abs: 3.2 10*3/uL (ref 0.7–4.0)
Lymphs Abs: 2 10*3/uL (ref 0.7–4.0)
Monocytes Absolute: 1 10*3/uL (ref 0.1–1.0)
Monocytes Relative: 9 % (ref 3–12)
Monocytes Relative: 8 % (ref 3–12)
Neutro Abs: 6.7 10*3/uL (ref 1.7–7.7)
Neutrophils Relative %: 60 % (ref 43–77)

## 2010-09-23 LAB — URINE MICROSCOPIC-ADD ON

## 2010-09-23 LAB — BASIC METABOLIC PANEL WITH GFR
BUN: 21 mg/dL (ref 6–23)
CO2: 25 meq/L (ref 19–32)
Calcium: 9.3 mg/dL (ref 8.4–10.5)
Chloride: 106 meq/L (ref 96–112)
Creatinine, Ser: 1.28 mg/dL (ref 0.4–1.5)
GFR calc Af Amer: >60 mL/min (ref >60)
GFR calc non Af Amer: 56 mL/min — ABNORMAL LOW (ref >60)
Glucose, Bld: 179 mg/dL — ABNORMAL HIGH (ref 70–99)
Potassium: 4.5 meq/L (ref 3.5–5.1)
Sodium: 139 meq/L (ref 135–145)

## 2010-09-23 LAB — URINALYSIS, ROUTINE W REFLEX MICROSCOPIC
Bilirubin Urine: NEGATIVE
Glucose, UA: 250 mg/dL — AB
Leukocytes, UA: NEGATIVE
Protein, ur: NEGATIVE mg/dL
Specific Gravity, Urine: 1.005 (ref 1.005–1.030)
Urobilinogen, UA: 0.2 mg/dL (ref 0.0–1.0)
Urobilinogen, UA: 0.2 mg/dL (ref 0.0–1.0)
pH: 6 (ref 5.0–8.0)

## 2010-09-23 LAB — STONE ANALYSIS: Stone Weight KSTONE: 0.044 g

## 2010-09-23 LAB — GLUCOSE, CAPILLARY
Glucose-Capillary: 142 mg/dL — ABNORMAL HIGH (ref 70–99)
Glucose-Capillary: 169 mg/dL — ABNORMAL HIGH (ref 70–99)
Glucose-Capillary: 177 mg/dL — ABNORMAL HIGH (ref 70–99)
Glucose-Capillary: 182 mg/dL — ABNORMAL HIGH (ref 70–99)
Glucose-Capillary: 189 mg/dL — ABNORMAL HIGH (ref 70–99)
Glucose-Capillary: 210 mg/dL — ABNORMAL HIGH (ref 70–99)
Glucose-Capillary: 219 mg/dL — ABNORMAL HIGH (ref 70–99)
Glucose-Capillary: 225 mg/dL — ABNORMAL HIGH (ref 70–99)
Glucose-Capillary: 225 mg/dL — ABNORMAL HIGH (ref 70–99)

## 2010-09-23 LAB — BASIC METABOLIC PANEL
CO2: 28 mEq/L (ref 19–32)
Calcium: 9.9 mg/dL (ref 8.4–10.5)
Creatinine, Ser: 1.18 mg/dL (ref 0.4–1.5)
GFR calc Af Amer: >60 mL/min (ref >60)
GFR calc non Af Amer: >60 mL/min (ref >60)
Sodium: 140 mEq/L (ref 135–145)

## 2010-09-23 LAB — CBC
HCT: 38.1 % — ABNORMAL LOW (ref 39.0–52.0)
Hemoglobin: 13.1 g/dL (ref 13.0–17.0)
MCHC: 33.4 g/dL (ref 30.0–36.0)
MCHC: 32.9 g/dL (ref 30.0–36.0)
MCV: 80.8 fL (ref 78.0–100.0)
RBC: 4.87 MIL/uL (ref 4.22–5.81)
RBC: 4.71 MIL/uL (ref 4.22–5.81)
WBC: 11.1 10*3/uL — ABNORMAL HIGH (ref 4.0–10.5)
WBC: 12.7 10*3/uL — ABNORMAL HIGH (ref 4.0–10.5)

## 2010-10-28 NOTE — Op Note (Signed)
NAME:  Francisco Powell, Francisco Powell NO.:  [id number]   MEDICAL RECORD NO.:  [id number]          PATIENT TYPE:  OBV   LOCATION:  A313                          FACILITY:  [facility]   PHYSICIAN:  Ky Barban, M.D.DATE OF BIRTH:  [birth year]   DATE OF PROCEDURE:  DATE OF DISCHARGE:                               OPERATIVE REPORT   PREOPERATIVE DIAGNOSIS:  Right ureteral calculi.   POSTOPERATIVE DIAGNOSIS:  Right ureteral calculi.   PROCEDURE:  1. Cystoscopy.  2. Right retrograde pyelogram.  3. Ureteroscopic stone basket extraction.  4. Insertion of double-J stent.  5. Holmium laser lithotripsy.   ANESTHESIA:  Spinal.   PROCEDURE:  The patient in spinal anesthesia in lithotomy position,  after usual prep and drape.  A #25 cystoscope introduced into the  bladder.  He has enlarged prostate with bladder neck obstruction.  Bladder shows no evidence of any tumor, stone, or foreign body.  Right  ureteral orifice was catheterized with a wedge catheter.  Hypaque was  injected under fluoroscopic control.  Just at the level of the  ureterovesical junction, there appeared to be a filling defect.  The  ureter above that was dilated, then there was a little bit of  obstruction seen at the L3 level and that could be another stone.  Ureter was moderately dilated, but not significantly.  So, I put a  guidewire all the way up into the renal pelvis and over the guidewire.  A 15 balloon dilator was introduced and the intramural ureter was  dilated.  Then once the ureter was dilated, used a short rigid  ureteroscope and went to the level of the distal ureteral calculus.  It  was basketed without any problem.  Then I advanced the ureteroscope, but  I could not get to the level of that stone, so I used a longer rigid  ureteroscope, I was able to get to the upper stone, which was just below  the UPJ.  The stone was grabbed with the help of a stone basket.  Then  using a small laser fiber, the  stone was completely pulverized and once  the stone was completely pulverized, then on several passes, I was able  to get these pieces out and at the end, the ureteroscope was introduced.  I did not see any larger piece in the ureter.  There were sand-like  particles stuck to the mucosa.  So, I decided to leave a double-J stent  and at this point, the ureteroscope was removed and guidewire was passed  through the cystoscope and over the guide wire, 5-French 24-cm double-J  stent was positioned within the renal pelvis and the bladder.  Nice loop  was obtained in the renal pelvis and the bladder and  string was left attached.  A #16 Foley catheter was left in for  drainage.  The patient left the operating room in satisfactory  condition.  I used laser energy 0.8 joules, pulse per second 15, power  at 12 watts, total kilojoules used 0.31.      Ky Barban, M.D.  Electronically Signed  MIJ/MEDQ  D:  01/20/2008  T:  01/21/2008  Job:  [id number]

## 2010-10-28 NOTE — Assessment & Plan Note (Signed)
[facility]                            CARDIOLOGY OFFICE NOTE   NAME:Francisco Powell, Francisco Powell                     MRN:          [id number]  DATE:04/28/2007                            DOB:          08-18-1940    Francisco Powell comes in today for further management of the following issues:  1. Non-obstructive coronary artery disease.  2. Hypertension.  3. Hyperlipidemia.  4. Type-2 diabetes, recently put on insulin.  5. Obesity.   Since I saw him in August, he has been taking the aspirin that I  recommended at 81 mg daily. He also continues on hydrochlorothiazide 25  mg daily. Pam, his daughter, who is a Engineer, civil (consulting) at [employer] is checking  his pressures and his wife says that it has been running around 130/80.  It is running at about that today.   Dr. Gerda Diss is following his blood work and his diabetes.   He is not having any exertional chest discomfort or angina. He is having  a lot of low back pain and may have to go back and see Dr. Simonne Come. It  sounds like he may be having some right leg sciatica.   CURRENT MEDICATIONS:  1. Ramipril 10 mg b.i.d.  2. Glipizide 10 mg b.i.d.  3. Actos 40 mg daily.  4. Metformin 1000 mg b.i.d.  5. Amlodipine 10 mg daily.  6. Simvastatin 40 mg nightly.  7. Lantus as directed.  8. Hydrochlorothiazide 25 mg daily.  9. Aspirin 81 mg daily.   PHYSICAL EXAMINATION:  VITAL SIGNS:  Blood pressure 130/84, pulse 68 and  regular. His weight is down only 1 pound to 288.  GENERAL:  He is in no acute distress. Skin is warm and dry. He is alert  and oriented.  HEENT:  Unchanged  NECK:  Carotid upstrokes are equal bilaterally without bruit. There is  no JVD. Thyroid is not enlarged. Trachea is midline.  LUNGS:  Clear to auscultation.  CARDIAC:  His PMI is hard to appreciate. He has a normal S1 and S2  without gallop.  ABDOMEN:  Protuberant with good bowel sounds. Organomegaly is difficult  to assess. His abdomen is soft. There is no  obvious hepatomegaly.  EXTREMITIES:  Reveal no cyanosis, clubbing, or edema. Pulses are intact.  NEUROLOGIC:  Intact.   EKG shows sinus bradycardia with left axis, with an incomplete right  bundle. He has an axis shift from before from -34 to -60. He has either  small R-waves or small insignificant Qs in 3 and AVF. Lead 2 is fine.   ASSESSMENT AND PLAN:  Francisco Powell is stable from our standpoint. He is  due a stress Myoview which we will arrange through [facility] through  his request. We will look specifically at the inferior wall to make sure  nothing has changed. We will ask him to continue his current  medications. I have encouraged him to keep his weight down and check his  pressure regularly. We will see him back again in six months.     Thomas C. Daleen Squibb, MD, [facility]  Electronically Signed    TCW/MedQ  DD: 04/28/2007  DT: 04/29/2007  Job #: [id number]   cc:   Lorin Picket A. Gerda Diss, MD

## 2010-10-28 NOTE — Assessment & Plan Note (Signed)
[facility]                            CARDIOLOGY OFFICE NOTE   Francisco, Postlewait YOCHANAN Powell                     MRN:          [id number]  DATE:01/19/2007                            DOB:          08-15-1940    [name] comes today to establish with me as his cardiologist.  He is a former patient of Dr. Dionicio Stall.   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   He is followed by Dr. [name] in [place].   PROBLEM LIST:  1. Hypertension.  2. Hyperlipidemia.  3. Type 2 diabetes, recently put on insulin.  4. Obesity.  5. Nonobstructive coronary artery disease.   In regard to the latter, he presented with chest pain ruled out for MI  in March of 2004 and underwent cardiac catheterization, which showed an  EF of 65% with no wall motion abnormalities.  His left main was normal.  LAD was a large vessel with a 40% stenosis in the proximal LAD.  He had  2 diagonal branches had 80% lesions ostially.  His circumflex is  moderate size, had 20% in its proximal segment of the first obtuse  marginal.  Right coronary artery was a large dominant vessel with a 60%  stenosis in the mid vessel overlying the takeoff of a single marginal  branch.  Abdominal angiogram was normal, except for 2 right renal  arteries, 1 which had at least a moderate stenosis of the upper vessel.   He is having no angina or chest discomfort.  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  His last stress Myoview  was July 10, 2005, which was an adenosine study.  He had an ejection  fraction of 55% and some mild diaphragmatic attenuation.  Overall wall  motion was normal and there was no evidence of ischemia or scar.   The biggest problem has been his weight.  It is up probably 15 to 20  pounds.  He says he loves to eat.  He seems very compliant with his  medications and actually Dr. Gerda Diss just increased his ramipril from 10  to 20 daily.   His blood pressure is running high today.  He says he avoids salt.   CURRENT MEDICATIONS:  1. Ramipril 10 mg a day.  2. Glipizide extended release 10 mg b.i.d.  3. Actos 45 mg a day.  4. Metformin 1000 mg b.i.d.  5. Amlodipine 10 mg a day.  6. Simvastatin 40 mg nightly.  7. Lantus as directed.   EXAM:  Blood pressure 160/90 in the right arm, 153/90 in the left arm.  I repeated it in the left arm with a large cuff.  It was 160/90, pulse  59 and regular, weight 289, up 8 from his last visit.  HEENT:  Normocephalic, atraumatic.  PERRLA.  Extraocular movements are  intact.  Sclerae clear.  Facial  symmetry is normal.  Dentition is  satisfactory.  Carotid upstrokes are equal bilaterally without bruits.  No JVD.  Thyroid is not enlarged.  Trachea is midline.  LUNGS:  Clear.  HEART:  Reveals a nondisplaced but poorly appreciated PMI.  He has a  normal S1, S2 without murmur, rub, or gallop.  ABDOMEN:  Protuberant with good bowel sounds.  No midline bruit.  No  hepatomegaly.  EXTREMITIES:  No cyanosis, clubbing, but only trace edema.  Pulses are  intact.  NEURO:  Intact.   ELECTROCARDIOGRAM:  Shows sinus rhythm with left axis deviation, with an  incomplete right bundle branch block.  This has been seen on previous  ECG.   ASSESSMENT AND PLAN:  I had a long talk with [name] today about  secondary prevention of stroke, myocardial infarction, or renal failure.  I have also called Dr. [name].  We have started him on  hydrochlorothiazide 25 mg a day.  He will follow up with Dr. Gerda Diss for  his blood pressure in about 3 weeks.  He will also obtain a large adult  blood pressure cuff and his daughter, Elita Quick, who is a Engineer, civil (consulting) at [employer],  will be checking his pressures.  He will take these readings to Dr.  Gerda Diss.   I have  also asked him to try to lose 15 to 20 pounds if possible.   I am going to have him return in 3 months to see me to get to know him  better.  At that time, we will set him up for an exercise stress Myoview  or an adenosine Myoview.     Thomas C. Daleen Squibb, MD, [facility]  Electronically Signed    TCW/MedQ  DD: 01/19/2007  DT: 01/19/2007  Job #: [id number]   cc:   Lorin Picket A. Gerda Diss, MD

## 2010-10-28 NOTE — H&P (Signed)
NAME:  SEVERIN, BOU NO.:  [id number]   MEDICAL RECORD NO.:  [id number]          PATIENT TYPE:  AMB   LOCATION:  DAY                           FACILITY:  [facility]   PHYSICIAN:  Dalia Heading, M.D.  DATE OF BIRTH:  [birth year]   DATE OF ADMISSION:  DATE OF DISCHARGE:  LH                              HISTORY & PHYSICAL   CHIEF COMPLAINT:  Family history of colon carcinoma.   HISTORY OF PRESENT ILLNESS:  The patient is a 70 year old white male who  is referred for endoscopic evaluation.  He needs a followup colonoscopy  due to a family history of colon carcinoma.  He denies any abdominal  pain, weight loss, nausea, vomiting, diarrhea, constipation, melena,  hematochezia.  He last had a colonoscopy in 2003.   PAST MEDICAL HISTORY:  1. Hypertension.  2. Insulin-dependent-diabetes mellitus.   PAST SURGICAL HISTORY:  1. Cholecystectomy.  2. Eye surgery.  3. Back surgery.   CURRENT MEDICATIONS:  Metformin, ramipril, amlodipine, glipizide, Lantus  insulin, hydrochlorothiazide, Simvastatin.   ALLERGIES:  No known drug allergies.   REVIEW OF SYSTEMS:  Noncontributory.   PHYSICAL EXAMINATION:  GENERAL: The patient is well developed and well  nourished white male in no acute distress.  LUNGS:  Clear to auscultation with equal breath sounds bilaterally.  HEART:  Reveals a regular rate and rhythm without S3, S4, or murmurs.  ABDOMEN:  Soft, nontender, nondistended.  No hepatosplenomegaly or  masses are noted.  RECTAL:  Deferred to the procedure.   IMPRESSION:  Need for screening colonoscopy with a family history of  colon carcinoma.   PLAN:  The patient is scheduled for a colonoscopy on August 23, 2007.  The risks and benefits of the procedure including bleeding and  perforation were fully explained to the patient, gave informed consent.      Dalia Heading, M.D.  Electronically Signed     MAJ/MEDQ  D:  08/11/2007  T:  08/11/2007  Job:  [id number]   cc:    [facility]  Fax: [phone]   Scott A. Gerda Diss, MD  Fax: [phone]

## 2010-10-28 NOTE — H&P (Signed)
NAME:  Francisco Powell, Francisco Powell NO.:  [id number]   MEDICAL RECORD NO.:  [id number]          PATIENT TYPE:  INP   LOCATION:  A317                          FACILITY:  [facility]   PHYSICIAN:  Dennie Maizes, M.D.   DATE OF BIRTH:  08-24-1940   DATE OF ADMISSION:  10/21/2008  DATE OF DISCHARGE:  LH                              HISTORY & PHYSICAL   ATTENDING PHYSICIAN:  Dennie Maizes, MD   CHIEF COMPLAINT:  Severe left flank pain, nausea, and vomiting.   HISTORY OF PRESENT ILLNESS:  This is a 70 year old male who has a  history of recurrent urolithiasis in the past.  He was treated for  kidney stones last year.  He undergone extracorporeal shock wave  lithotripsy as well as holmium laser lithotripsy of right ureteral  calculi in August 2009.   The patient has been having severe left flank pain radiating to the  front since midnight last night.  He also has nausea and vomiting.  He  was seen in the emergency room.  Evaluation revealed a 5 x 4-mm size  left distal ureteral calculus at the level of ureterovesical junction  with obstruction, hydronephrosis, and perinephric stranding.  The  patient did not have adequate pain relief in the ER.  He has been  admitted to hospital for pain relief and further treatment.   He denied having voiding difficulty, hematuria, fever, chills, or  dysuria at present.   PAST MEDICAL HISTORY:  1. Recurrent urolithiasis status post ESL.  2. Holmium laser lithotripsy in 2009.  3. History of hypertension.  4. Diabetes mellitus.  5. Asbestosis.  6. Hyperlipidemia.  7. Coronary artery disease.  8. Status post cholecystectomy.  9. Eye surgery.  10.Back surgery.   MEDICATIONS:  1. Metformin 1000 mg p.o. b.i.d.  2. Glipizide XL 10 mg p.o. b.i.d.  3. Amlodipine 10 mg p.o. daily.  4. Hydrochlorothiazide 12.5 mg p.o. q.a.m.  5. Ramipril 10 mg p.o. 2 daily.  6. Simvastatin 40 mg p.o. daily.  7. Lantus insulin 70 units daily.   ALLERGIES:   LIPITOR.   PHYSICAL EXAMINATION:  HEAD, EYES, EARS, NOSE, and THROAT:  Normal.  LUNGS:  Clear to auscultation.  HEART:  Regular rate and rhythm.  No murmurs.  ABDOMEN:  Soft.  No palpable flank mass.  Mild left costovertebral angle  tenderness was noted.  Bladder not palpable.  GENITOURINARY:  Testes are normal.  RECTAL:  Not done.   ADMISSION LABS:  CBC, WBC 12.7, hemoglobin 12.5, hematocrit 38.1,  glucose 179, BUN 21, creatinine 1.28, and calcium 9.3.  Electrolytes  within normal range.  Urinalysis, blood small, nitrite negative,  leukocyte esterase negative.  Microscopic examination, rbc's 11-20 per  high-power field, wbc's 7-10 per high-power field.  CT scan of abdomen  and pelvis without contrast revealed normal right kidney and collecting  system.  There is a 6 x 5-mm size stone in the lower pole of the left  kidney.  The perinephric stranding and hydronephrosis were noted.  There  is an obstructing stone about 5 x 4 mm in size in the left distal  ureterovesical junction area.   IMPRESSION:  Left distal ureteral calculus with obstruction, left  hydronephrosis, left renal colic, and diabetes mellitus.   PLAN:  1. Medical consult with Dr. [name] for management of medical      problems.  2. IV fluids.  3. Discontinue Dilaudid.  4. I discussed with the patient regarding management options.  We will      repeat an x-ray in KUB area in a.m. for stone localization and      migration.  If he has persistent severe pain, I will plan to      proceed with cystoscopy, left retrograde pyelogram, ureteroscopy      stone extraction, and stent placement.      Dennie Maizes, M.D.  Electronically Signed     SK/MEDQ  D:  10/21/2008  T:  10/22/2008  Job:  [id number]   cc:   Lorin Picket A. Gerda Diss, MD  Fax: [phone]

## 2010-10-28 NOTE — H&P (Signed)
NAME:  Francisco Powell, Francisco Powell NO.:  [id number]   MEDICAL RECORD NO.:  [id number]          PATIENT TYPE:  AMB   LOCATION:  DAY                           FACILITY:  [facility]   PHYSICIAN:  Dennie Maizes, M.D.   DATE OF BIRTH:  Jun 22, 1940   DATE OF ADMISSION:  01/11/2008  DATE OF DISCHARGE:  LH                              HISTORY & PHYSICAL   CHIEF COMPLAINT:  Right upper ureteral calculus with obstruction, post  ureteral stent placement, right flank pain.   HISTORY OF PRESENT ILLNESS:  This 70 year old male has a history of  recurrent urolithiasis.  He was admitted to the hospital by Dr. Lorin Picket A.  Luking last week.  I saw him in consultation.  His x-rays revealed a 7  mm size right proximal ureteral calculus with hydronephrosis.  A 6 mm  size stone was noted in the lower pole of the left kidney without  obstruction.  The patient has undergone cystoscopy, retrograde pyelogram  and right ureteral stent placement with relief of severe pain after the  stent placement.  However, he has become uncomfortable with intermittent  right flank pain, urinary frequency and dysuria at present.  There is no  history of fever or chills.  He has not had any gross hematuria.   PAST MEDICAL HISTORY:  Recurrent urolithiasis, hypertension, diabetes  mellitus, asbestosis, hyperlipidemia, coronary artery disease.   MEDICATIONS:  1. Metformin 1000 p.o. b.i.d.  2. Glipizide XL 10 mL p.o. b.i.d.  3. Amlodipine 10 p.o. daily.  4. Hydrochlorothiazide 12.5 p.o. daily.  5. Ramipril 10 mg two p.o. daily.  6. Simvastatin 40 mg p.o. daily.  7. Lantus insulin 58 units every night.   ALLERGIES:  He is intolerant to LIPITOR.   PHYSICAL EXAMINATION:  HEENT:  Normal.  NECK:  No masses.  LUNGS:  Clear to auscultation.  HEART:  Regular rate and rhythm.  No murmurs.  ABDOMEN:  Soft.  No palpable flank mass or CVA tenderness.  Bladder not  palpable.  Testes are normal.   IMPRESSION:  Right upper ureteral  calculus with obstruction, post right  ureteral stent placement, right flank pain.   PLAN:  I have discussed with the patient and his wife regarding further  treatment.  He is scheduled to undergo ESL right upper ureteral calculus  with IV sedation at [facility].  Informed the patient regarding  the diagnosis, operative details, operative treatment including all  possible risks and complications.  They have agreed for the procedure to  be done.      Dennie Maizes, M.D.  Electronically Signed     SK/MEDQ  D:  01/11/2008  T:  01/11/2008  Job:  [id number]   cc:   Lorin Picket A. Gerda Diss, MD  Fax: [phone]   [facility]

## 2010-10-28 NOTE — Discharge Summary (Signed)
NAME:  Francisco Powell, Francisco Powell NO.:  [id number]   MEDICAL RECORD NO.:  [id number]          PATIENT TYPE:  OBV   LOCATION:  A310                          FACILITY:  [facility]   PHYSICIAN:  Scott A. Gerda Diss, MD    DATE OF BIRTH:  September 09, 1940   DATE OF ADMISSION:  01/02/2008  DATE OF DISCHARGE:  07/21/2009LH                               DISCHARGE SUMMARY   HOSPITAL COURSE:  This gentleman was admitted in for severe right-sided  flank pain with hydronephrosis.  He was consulted to Urology.  Dr.  Rito Ehrlich saw him.  He put a stent in on January 03, 2008, that alleviated  the pain.  The patient was able to urinate fine.  The pain was gone.  He  was able to tolerate food well and was discharged to home.  He was  instructed to decrease his Lantus down to 35 units and gradually move up  on it depending on how his sugars go, and it is okay for him to take the  metformin, because he did not receive any intravenous contrast plus also  he is to follow up in our office within few weeks.  Follow up with Dr.  Rito Ehrlich on Friday.  Lithotripsy, next Wednesday.      Scott A. Gerda Diss, MD  Electronically Signed     SAL/MEDQ  D:  01/03/2008  T:  01/04/2008  Job:  [id number]   cc:   Dennie Maizes, M.D.  Fax: [phone]

## 2010-10-28 NOTE — Assessment & Plan Note (Signed)
[facility]                            CARDIOLOGY OFFICE NOTE   Francisco, Capaldi MATTHIEU Powell                     MRN:          [id number]  DATE:11/08/2007                            DOB:          [birth year]    HISTORY OF PRESENT ILLNESS:  Francisco Powell comes in today for management of the  following issues:  1. Nonobstructive coronary disease.  He is having no angina, ischemic      symptoms.  Last Myoview was May 02, 2007, that showed EF 51%,      mild LV dilatation, septal hypokinesia.  No ischemia.  2. Hypertension which has been under fairly good control.  3. Hyperlipidemia followed by Dr. Gerda Diss,  4. Type 2 diabetes.  He still Battle's up and down with his sugar.  He      is having a tough time with diet as always!  5. Obesity.  6. Low back pain.   MEDICATIONS:  1. He is not taking his aspirin on a daily basis.  We have reinforced      this today.  2. Glipizide XL 10 mg p.o. b.i.d.  3. Metformin 1000 mg p.o. b.i.d.  4. Amlodipine 10 mg a day.  5. Simvastatin 40 mg q.h.s.  6. Lantus as directed.  7. Aspirin 81 mg a day.  8. Ramipril 10 mg p.o. b.i.d.  9. HCTZ 12.5 mg daily.   PHYSICAL EXAMINATION:  VITAL SIGNS:  His blood pressure is 150/88, pulse  is 54 and regular.  His weight is 282 down 6!  HEENT:  Unchanged.  Carotids are full without bruits.  There is no JVD.  Thyroid is not enlarged.  Trachea is midline.  LUNGS:  Clear.  HEART:  Reveals a poorly appreciated PMI, soft S1-S2.  ABDOMEN:  Exam is protuberant, good bowel sounds.  No midline bruit.  EXTREMITIES:  No cyanosis, clubbing or edema.  Pulses are intact.  NEUROLOGICAL:  Exam is intact.   ASSESSMENT:  I had a nice chat with Francisco Powell today.  I rechecked his blood  pressure with a large cuff and it was actually 138/80.  I told him to  follow up closely with his blood pressures and his blood work with Dr.  Gerda Diss.  We have again reinforced him taking a coated aspirin 81 mg a  day.  I have  also told him that if his legs start getting numb to get  this evaluated sooner than his Medicare kicks in.  He is trying to wait  to have surgery until that time.   I will plan on seeing him back in a year.     Thomas C. Daleen Squibb, MD, [facility]  Electronically Signed    TCW/MedQ  DD: 11/08/2007  DT: 11/08/2007  Job #: [id number]   cc:   Lorin Picket A. Gerda Diss, MD

## 2010-10-28 NOTE — Consult Note (Signed)
NAME:  Francisco Powell, Francisco Powell NO.:  [id number]   MEDICAL RECORD NO.:  [id number]          PATIENT TYPE:  OBV   LOCATION:  A310                          FACILITY:  [facility]   PHYSICIAN:  Dennie Maizes, M.D.   DATE OF BIRTH:  07/04/1940   DATE OF CONSULTATION:  01/03/2008  DATE OF DISCHARGE:  01/03/2008                                 CONSULTATION   CONSULTING PHYSICIANS:  Dennie Maizes, MD   REASON FOR CONSULTATION:  Right flank pain and right upper ureteral  calculus with obstruction.   HISTORY OF PRESENT ILLNESS:  The 71 year old male has a history of  recurrent urolithiasis.  He has passed several stones in the past.  He  was experiencing sudden onset of severe, right flank pain radiating to  the front for about 3 days.  This associated with nausea.  The patient  could not tolerate the pain and he came to emergency room for further  evaluation.  Noncontrast CT scan of abdomen and pelvis revealed a 7 mm  sized right proximal ureteral calculus with obstruction and  hydronephrosis.  A 6 mm size, nonobstructing left lower pole renal  calculus was also noted.  I was asked to see the patient for further  evaluation and treatment.   The patient did not have any fever, chills, voiding difficulty, dysuria,  or gross hematuria.   PAST MEDICAL HISTORY:  1. Recurrent urolithiasis.  2. Hypertension.  3. Diabetes mellitus.  4. Asbestosis.  5. Hyperlipidemia.  6. Coronary artery disease.   MEDICATIONS:  1. Metformin 1000 mg p.o. b.i.d.  2. Glipizide-XL 10 mg p.o. b.i.d.  3. Amlodipine 10 mg p.o. daily.  4. Hydrochlorothiazide 12.5 mg every morning.  5. Ramipril 10 mg 2 p.o. daily.  6. Simvastatin 40 mg 1 p.o. daily.  7. Lantus insulin 58 units every night.   ALLERGIES:  He is intolerant of Lipitor.   PHYSICAL EXAMINATION:  ABDOMEN:  Soft.  No palpable flank mass or CVA  tenderness.  GENITOURINARY:  Bladder not palpable.  Penis and testes are normal.  RECTAL:   Deferred.   ADMISSION LABORATORY DATA:  Urinalysis:  Blood moderate, nitrate  negative, and leukocytes negative.  Microscopic RBCs 7-10 per high-power  field.  Bacteria many.  BMET:  BUN 13 and creatinine 0.92.  CBC:  WBC  9.4, hemoglobin 12.6, and hematocrit 38.4.   IMPRESSION:  Right upper ureteral calculus with obstruction, right  hydronephrosis, right renal colic, and nonobstructing left renal  calculus.   PLAN:  I discussed with the patient and his family regarding management  options.  He has a big stone in the proximal ureter.  I discussed with  him about cystoscopy, right retrograde pyelogram, and right ureteral  stent placement and they were agreeable.  Procedure will be done on January 03, 2008.  I explained to the patient regarding the diagnosis, operative  details, alternative treatment, outcome, possible risks and  complications, and he has agreed for the procedure to be done.  Obstructing stone will later be treated with ESL as an outpatient.      Dennie Maizes, M.D.  Electronically Signed     SK/MEDQ  D:  01/03/2008  T:  01/04/2008  Job:  [id number]

## 2010-10-28 NOTE — H&P (Signed)
NAME:  Francisco Powell, Francisco Powell NO.:  [id number]   MEDICAL RECORD NO.:  [id number]          PATIENT TYPE:  OBV   LOCATION:  A310                          FACILITY:  [facility]   PHYSICIAN:  Scott A. Gerda Diss, MD    DATE OF BIRTH:  1941-02-22   DATE OF ADMISSION:  01/02/2008  DATE OF DISCHARGE:  LH                              HISTORY & PHYSICAL   CHIEF COMPLAINT:  Right renal colic.   HISTORY OF PRESENT ILLNESS:  A 70 year old male having severe right-  sided flank pain discomfort that started 3 days ago. It hits him in  waves and is so severe that it knocks him literally to his knees and  sometimes to the floor.  He gets nauseous with this and broke out in  sweats with it.  Has not felt like eating. Has been trying to drink, but  everything he takes down he throws back up. He denies fevers, chills,  dysuria.  He denies rectal bleeding or hematuria.  He has had kidney  stones in the past.  He denies any chest tightness, pressure pain, and  shortness of breath.  Denies any leg swelling.   PAST MEDICAL HISTORY:  1. Hypertension.  2. Diabetes.  3. Kidney stones.  4. Asbestosis.  5. Hyperlipidemia.  6. Coronary artery disease- mild.  Negative Cardiolite in January      2007.   FAMILY HISTORY:  History of colon cancer, HTN, diabetes, heart disease.   SOCIAL HISTORY:  Does not smoke or drink.   ALLERGIES:  BEE STINGS.  DID NOT TOLERATE LIPITOR.   MEDICATIONS:  1. Metformin 1000 mg b.i.d.  2. Glipizide XL 10 mg b.i.d.  3. Amlodipine 10 mg daily.  4. Hydrochlorothiazide 12.5 mg each morning.  5. Ramipril 10 mg two daily.  6. Simvastatin 40 mg daily.  7. Lantus 58 units nightly.   REVIEW OF SYSTEMS:  See per above.   SOCIAL HISTORY:  Is married, has children does not smoke or drink.   PHYSICAL EXAMINATION:  Vital Signs: BP 138/88, afebrile.  HEENT: TMs and LT.  NECK:  Supple.  CHEST:  CTA.  No crackles.  HEART: Regular.  ABDOMEN: Soft, obese, tender on the right side,  tender right flank.  EXTREMITIES:  No edema.  SKIN:  Warm, dry.  NEUROLOGIC:  Grossly normal.   Urinalysis with 3 to 5RBCs per HPF. CBC white count 9.4, hemoglobin  12.6, hematocrit 38.4, platelets 210.  BMET shows a potassium 3.6,  sodium 142, glucose 95, BUN 13, creatinine 0.92.  CT scan shows mildly  obstructive proximal right ureter stone and spondylosis.  A/P right  ureteral stone with severe pain, nausea and vomiting - the patient has  essentially tried outpatient therapy and has failed this. I feel he  needs IV fluids, pain management with consult Dr. Rito Ehrlich, probably  will need to have a stent placed in order to achieve drainage and allow  the stone to come help or be removed.  Dr. Rito Ehrlich will see the patient  this evening and possibly had stent placement tomorrow. We are covering  his diabetes with  a sliding scale and monitor him closely.      Scott A. Gerda Diss, MD  Electronically Signed     SAL/MEDQ  D:  01/02/2008  T:  01/02/2008  Job:  [id number]

## 2010-10-28 NOTE — Op Note (Signed)
NAME:  Francisco Powell, Francisco Powell NO.:  [id number]   MEDICAL RECORD NO.:  [id number]          PATIENT TYPE:  OBV   LOCATION:  A310                          FACILITY:  [facility]   PHYSICIAN:  Dennie Maizes, M.D.   DATE OF BIRTH:  07-Apr-1941   DATE OF PROCEDURE:  01/03/2008  DATE OF DISCHARGE:  01/03/2008                               OPERATIVE REPORT   PREOPERATIVE DIAGNOSES:  Right upper ureteral calculus obstruction,  right hydronephrosis, right renal colic.   POSTOPERATIVE DIAGNOSES:  Right upper ureteral calculus obstruction,  right hydronephrosis, right renal colic.   OPERATIVE PROCEDURE:  Cystoscopy, right retrograde pyelogram, right  ureteral stent placement.   ANESTHESIA:  Spinal.   SURGEON:  Dennie Maizes, MD   COMPLICATIONS:  None.   ESTIMATED BLOOD LOSS:  None.   DRAINS:  6-French 26-cm size right ureteral stent.   SPECIMEN:  None.   INDICATIONS FOR THE PROCEDURE:  This 70 year old male has a history of  recurrent urolithiasis.  He was admitted to the hospital with severe  right flank pain.  X-rays revealed a 7-mm size right upper ureteral  calculus with obstruction and hydronephrosis.  The patient was unable to  pass the stone.  He was taken to the operating room today for  cystoscopy, retrograde pyelogram, and right ureteral stent placement.  Obstructing stone will later be treated with ESL as an outpatient.   DESCRIPTION OF THE PROCEDURE:  Spinal anesthesia was induced and the  patient was placed on the OR table in the dorsal lithotomy position.  The indwelling Foley catheter was removed.  The lower abdomen and  genitalia were prepped and draped in a sterile fashion.  Cystoscopy was  done with a 20-French scope.  The urethra, prostate, and bladder were  normal.  Mild trabeculations of the bladder were noted.  The trigone and  ureteral orifices were normal.   A 5-French wedge catheter was then placed in the right ureteral orifice.  About 7 mL of  Renografin-60 was injected to the collecting system and  retrograde pyelogram was done with C-arm fluoroscopy.  Distal ureter was  normal.  There was a large filling defect about 7 mm in size near the L4  transverse process.  There is proximal hydroureter and hydronephrosis.   A 5-French open-ended catheter was then placed in the right distal  ureter.  A 0.038-gauge Bentson guidewire with a flexible tip was  advanced into the right renal pelvis.  The open-ended catheter was  removed.  A 6-French 26-cm size stent was then inserted over the  guidewire and placed in the right collecting system.  The instruments  were removed.  The patient was transferred to the PACU in a satisfactory  condition.      Dennie Maizes, M.D.  Electronically Signed     SK/MEDQ  D:  01/03/2008  T:  01/04/2008  Job:  [id number]   cc:   Lorin Picket A. Gerda Diss, MD  Fax: [phone]

## 2010-10-28 NOTE — Op Note (Signed)
NAME:  Francisco Powell, Francisco Powell NO.:  [id number]   MEDICAL RECORD NO.:  [id number]          PATIENT TYPE:  INP   LOCATION:  A317                          FACILITY:  [facility]   PHYSICIAN:  Dennie Maizes, M.D.   DATE OF BIRTH:  April 19, 1941   DATE OF PROCEDURE:  10/23/2008  DATE OF DISCHARGE:  10/23/2008                               OPERATIVE REPORT   PREOPERATIVE DIAGNOSES:  Left distal ureteral calculi with obstruction,  left renal colic, left hydronephrosis.   POSTOPERATIVE DIAGNOSES:  Left distal ureteral calculi with obstruction,  left renal colic, left hydronephrosis.   OPERATIVE PROCEDURES:  Cystoscopy, left retrograde pyelogram, left  ureteroscopic stone extraction, and left ureteral stent placement.   ANESTHESIA:  Spinal.   SURGEON:  Dennie Maizes, MD   COMPLICATIONS:  None.   ESTIMATED BLOOD LOSS:  Minimal.   DRAINS:  6.26 cm size left ureteral stent with a string.   SPECIMEN:  Left ureteral calculus was sent for chemical analysis.   INDICATIONS FOR THE PROCEDURE:  A 70 year old male was admitted to the  hospital with severe left flank pain.  Evaluation revealed a 8 x 3 mm  size left distal ureteral calculus, obstruction, and hydronephrosis.  The patient was unable to pass the stone.  He was taken to the operating  room today for cystoscopy, left retrograde pyelogram, left ureteroscopic  stone extraction, and left ureteral stent placement.   DESCRIPTION OF THE PROCEDURE:  Spinal anesthesia was induced and the  patient was placed on the OR table in the dorsal lithotomy position.  The lower abdomen and genitalia were prepped and draped in a sterile  fashion.  Cystoscopy was done with a 25-French scope.  Urethra was  normal.  There was moderate hypertrophy of the prostate involving both  lateral lobes with bladder neck obstruction.  Bladder was examined and  found to be normal.  There was some edema around the left ureteral  orifice suggestive of an  underlying stone.  A 5-French wedge catheter  was then placed in the left ureteral orifice.  About 7 mL of Omnipaque  was injected into the collecting system and retrograde pyelogram was  done.  There is a filling defect in the distal ureter about 2 cm above  the ureteral orifice.  The scalp corresponded to the location of the  stone.  There is mild hydronephrosis and hydroureter.   A 5-French open-ended catheter was then placed in the left distal  ureter.  A 0.035 Bentson guide wire with a flexible tip was then  advanced into the left renal pelvis without any difficulty.  Distal  ureter was dilated using a 18-French 6 cm length balloon dilating  catheter.  The balloon dilating catheter was then removed leaving the  guidewire in place.  Ureteroscopy was done with a 8-point French rigid  ureteroscope.  The Stone was seen  at about 2 cm above the ureteral  orifice.  The stone was extracted using a nitinol ZeroTip wire basket.  Examination of the ureter was done up to the pelvic brim and no other  abnormality was noted.  A 6.26  cm size stent with a string was then  inserted into the left collecting system.  The instruments were removed.  The patient was transferred to the PACU in a satisfactory condition.      Dennie Maizes, M.D.  Electronically Signed     SK/MEDQ  D:  10/23/2008  T:  10/24/2008  Job:  [id number]   cc:   Lorin Picket A. Gerda Diss, MD  Fax: [phone]

## 2010-10-31 NOTE — H&P (Signed)
NAME:  CRISTEN, MURCIA.                      ACCOUNT NO.:  [id number]   MEDICAL RECORD NO.:  [id number]                   PATIENT TYPE:  INP   LOCATION:                                       FACILITY:  [facility]   PHYSICIAN:  Charlton Haws, M.D. [facility]              DATE OF BIRTH:  1940-11-03   DATE OF ADMISSION:  08/21/2002  DATE OF DISCHARGE:                                HISTORY & PHYSICAL   HISTORY OF PRESENT ILLNESS:  Mr. Fiorella is a 70 year old patient  transferred from [facility] ER for chest pain.  He has multiple coronary  risk factors including hypertension, non insulin-dependent diabetes,  hypercholesterolemia.  He has been having increasing left arm pain and  dyspnea over the last few months.  Last night he developed significant  substernal chest pain.  It was relieved in the emergency room with  sublingual nitroglycerin and morphine.  He is currently pain free on  transfer.  The pain is typical of angina.   PAST MEDICAL HISTORY:  1. Hypertension.  2. Diabetes.  3. Hyperlipidemia.  4. Cholelithiasis.  5. He has had back surgery and has problems laying flat.  6. He also has history of asbestos exposure, and has had PFTs in the past.  7. He has had previous left eye surgery for a detached retina.   SOCIAL HISTORY:  He is retired.  He lives in [place].  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   FAMILY HISTORY:  He has a positive family history for colon CA on the  father's side, and myocardial infarction on the mother's side.   MEDICATIONS:  1. Zocor 20 daily.  2. Norvasc 10 daily.  3. Glynase 6 b.i.d.  4. Avandia 8 daily.  5. Hydrochlorothiazide 25 daily.   PHYSICAL EXAMINATION:  GENERAL:  An elderly, overweight-appearing male who  initially had [place] in his mouth.  He is not a smoker, but does chew  tobacco.  LUNGS:  Clear with no evidence of wheezing.  CARDIAC:  There  is an S1 and S2 with distant heart sounds.  ABDOMEN:  Benign.  LOWER EXTREMITIES:  Intact pulses.  No edema.   LABORATORY DATA:  Remarkable for a creatinine of 1.0, potassium 3.7,  hemoglobin 13.2.  Platelets are normal at 216.  The initial CPK was 243 with  an MB of 5.9.  Troponin was negative.  Coags are normal.   The patient had an EKG done at [facility] that showed normal sinus rhythm  with no acute changes and left axis deviation.   IMPRESSION:  A 70 year old gentleman with multiple coronary risk factors,  acute coronary syndrome.  Risks of catheterization including stroke,  emergency surgery, and bleeding were discussed.  He is willing to proceed.  We will keep him on Lovenox for the  time being.  We will probably not be  able to catheterize him until Wednesday morning.   The morning of his surgery, we will hold his oral hypoglycemics and Lovenox.  For the time being, will keep him on Lovenox.  If he has recurrent pain, we  can start a TB3 inhibitor and IV nitroglycerin.   I would also hold his diuretic tomorrow and the morning of his  catheterization due to increased risk of nephrotoxicity, given that he is a  diabetic with hypertension.   He will have a 2-D echocardiogram tomorrow to assess his LV function and  valves.   The patient needs to quit chewing tobacco.   He is on good therapy for his hypertension and hypercholesterolemia.   I suspect he will need some sedation and analgesics to lay flat for his  catheterization.  He may be a good candidate for the Matrix study in regards  to minimizing his length of recumbency.                                               Charlton Haws, M.D. [facility]    PN/MEDQ  D:  08/21/2002  T:  08/21/2002  Job:  [id number]   cc:   Dr. Cala Bradford A. Gerda Diss, M.D.  [address], [address]  [place]  [place] [address]  Fax: [phone]

## 2010-10-31 NOTE — Op Note (Signed)
NAME:  Francisco Powell, Francisco Powell NO.:  [id number]   MEDICAL RECORD NO.:  [id number]          PATIENT TYPE:  OBV   LOCATION:  1505                         FACILITY:  [facility]   PHYSICIAN:  Marlowe Kays, M.D.  DATE OF BIRTH:  01-23-1941   DATE OF PROCEDURE:  10/27/2004  DATE OF DISCHARGE:                                 OPERATIVE REPORT   PREOPERATIVE DIAGNOSIS:  Spinal stenosis L2-3, L3-4, L4-5, status post Gill  procedure and L5 to sacrum fusion.   POSTOPERATIVE DIAGNOSIS:  Spinal stenosis L2-3, L3-4, L4-5, status post Gill  procedure and L5 to sacrum fusion.   OPERATION:  Central decompressive laminectomy L2-3, L3-4, and L4-5.   SURGEON:  Dr. Simonne Come   ASSISTANT:  Dr. Worthy Rancher   ANESTHESIA:  General.   PATHOLOGY AND JUSTIFICATION FOR PROCEDURE:  His original surgery was  performed in 1994.  He has had back and bilateral leg pain, left greater  than right.  An MRI has demonstrated disk bulging at L3-4, postoperative  changes at L5-S1, some asymmetrical disk bulging to the right at this level  as well.  He is also felt to have mild to moderate spinal stenosis at L2-3  and moderate central stenosis at L4-5.  I followed this with a lumbar  myelogram and CT scan to better define the spinal stenosis.  And this  demonstrated moderate spinal stenosis at 2-3 with left foraminal to  extraforaminal disk protrusion at L3-4, severe central stenosis at L4-5.  There was felt to be some lateral recess stenosis at L5-S1 on the left.  The  major problem appeared to be at L4-5.  I felt that trying to take down the  fusion at L5-S1 on the left would be very hazardous and that the major  pathology was at L4-5 with less severe pathology at L2-3 and L3-4 but still  probable contributors to his pain problem.   DESCRIPTION OF PROCEDURE:  Prophylactic antibiotics, satisfactory general  anesthesia, firm position on the [place] frame.  Back was prepped with  DuraPrep, draped in a  sterile field, Ioban employed.  I made a vertical  midline incision, encompassing the proximal portion of the  previous  incision and working upward for a number of inches.  Soft tissue was  dissected off the spinous processes in the mid to upper portion of our  incision, and these two were tagged with Kocher clamps and a lateral x-ray  taken indicating that we were at L1 and at L2.  Therefore, we directed our  dissection off distalward, and I continued working down to where we thought  was the L4 spinous process.  This was somewhat reassessed and not very  large.  We tagged this with a Kocher clamp as well and took a final lateral  x-ray indicating that we were at the inferior portion of the L4 spinous  process.  Based on this, we continued dissecting off soft tissues from L2  down to just distal to the marker at the inferior portion of the L4, placed  two self-retaining McCullough retractors with double action rongeur then  began removing  spinous process and neural arches from the inferior portion  of L2 all the way down to the previously decompressed L5-S1 area.  Then with  a combination of double action rongeurs and Kerrison rongeur and carefully  protecting the underlying dura, we made central then lateral recess  decompressions, bringing in the microscope to complete decompression.  The  neural arch at L4 appeared to be the major culprit in creating his stenosis  noted on myelogram on the left.  At the L4-5, he had a good bit of  ligamentum flavum which we removed.  At the conclusion of the decompression,  the foramina were all patent at L2-3, L3-4, and L4-5 bilaterally as was the  central canal.  As noted, because of the hazards of going in where he had  had previous surgery at L5-S1, I did not try and do any lateral recess  decompression at this level, but we restricted ourselves to the central  canal going down to roughly L5.  There were no iatrogenic complications.  Estimated  blood loss was perhaps 300 mL.  When the decompression was  completed, I placed Gelfoam over the dura and then used a quarter-inch  Penrose drain with safety pin going through the left flank subcutaneously on  top of the Gelfoam which I then carefully reapproximated the soft tissues  under direct visualization around the drain using #1 Vicryl in the  paralumbar muscle and fascia, 2-0 Vicryl in superficial subcutaneous tissue,  and staples in the skin.  Betadine and Adaptic dry sterile dressing were  applied.  He tolerated the procedure well and was taken to the recovery room  in satisfactory condition with no known complications.      JA/MEDQ  D:  10/27/2004  T:  10/27/2004  Job:  [id number]

## 2010-10-31 NOTE — Discharge Summary (Signed)
NAME:  Francisco Powell, Francisco Powell NO.:  [id number]   MEDICAL RECORD NO.:  [id number]          PATIENT TYPE:  OBV   LOCATION:  A313                          FACILITY:  [facility]   PHYSICIAN:  Ky Barban, M.D.DATE OF BIRTH:  04-28-1941   DATE OF ADMISSION:  DATE OF DISCHARGE:  LH                               DISCHARGE SUMMARY   A 70 year old gentleman who had a large stone 6-mm in size in the right  upper ureter and he was having severe pain.  He recently had ESL done.  His double-J stent was removed couple of days ago, but he had severe  pain, came to the emergency room, so he is admitted for further  management and control of pain.  A routine admission workup, CBC,  urinalysis, as well as 7 EKGs done.  He was observed for this pain, but  continued to have severe pain so I gave them the choice that I can try  to do a stone basket, but there is no guarantee that I will be able to  get the stone out.  The family agreed and I took him to the operating  room.  On January 20, 2008, a right retrograde pyelogram UV holmium laser  lithotripsy was done.  Stone was completely removed.  He had only 1  stone in the upper ureter which was almost 8-mm in size.  There was no  stone in the distal ureter as mentioned in the CT report.  Postoperatively, a left double-J stent and a Foley catheter.  On the  first postop day, he was doing fine, afebrile, no pain, Foley catheter  was removed.  He is voiding satisfactorily.  He still has double-J stent  with a string attached, which I will remove within couple of days in the  office.   FINAL DISCHARGE DIAGNOSES:  1. Right upper ureteral calculus.  2. Non-insulin-dependent diabetes.   DISCHARGE MEDICATION:  He is advised to continue his usual medications.  I gave him a prescription for Percocet 7.5 mg 30 tablets 1 q.6 h. p.r.n.  I advised him if he has any fever to let me now.  We will see him back  in the office in 2 days, which is Monday to  remove his stent.      Ky Barban, M.D.  Electronically Signed     MIJ/MEDQ  D:  02/10/2008  T:  02/11/2008  Job:  [id number]   cc:   Lorin Picket A. Gerda Diss, MD  Fax: [phone]

## 2010-10-31 NOTE — Discharge Summary (Signed)
NAME:  LUDWIG, TUGWELL NO.:  [id number]   MEDICAL RECORD NO.:  [id number]          PATIENT TYPE:  INP   LOCATION:  A316                          FACILITY:  [facility]   PHYSICIAN:  Scott A. Gerda Diss, MD    DATE OF BIRTH:  1940-10-13   DATE OF ADMISSION:  03/14/2004  DATE OF DISCHARGE:  10/03/2005LH                                 DISCHARGE SUMMARY   DISCHARGE DIAGNOSES:  1.  Pancreatitis.  2.  Abdominal pain.   HOSPITAL COURSE:  The patient admitted in with severe epigastric and right  upper quadrant pain and discomfort. Elevated lipase. Liver enzymes look  good. Ultrasound, which was done just recently in the past 30 days looked  good. The patient's symptoms resolved and he was able to tolerate oral  feedings. He was stable for discharge to home. A HIDA scan was set up as an  outpatient because of was suspected that he was having gallbladder  dysfunction.   DISPOSITION:  He was discharged in good condition.   DIET:  To follow a low-fat diet.     Scot   SAL/MEDQ  D:  04/03/2004  T:  04/03/2004  Job:  [id number]

## 2010-10-31 NOTE — Discharge Summary (Signed)
NAME:  Francisco Powell, Francisco Powell                        ACCOUNT NO.:  [id number]   MEDICAL RECORD NO.:  [id number]                   PATIENT TYPE:  INP   LOCATION:  IC08                                 FACILITY:  [facility]   PHYSICIAN:  Scott A. Gerda Diss, M.D.               DATE OF BIRTH:  April 27, 1941   DATE OF ADMISSION:  01/15/2004  DATE OF DISCHARGE:  01/16/2004                                 DISCHARGE SUMMARY   DISCHARGE DIAGNOSES:  1. Chest pain, musculoskeletal.  2. Diabetes.  3. Hypertension.   HOSPITAL COURSE:  This 70 year old white male was admitted in after having  severe back pain that radiated into the right scapula and right arm.  It was  concerning for the possibility of pulmonary embolism as well as an aneurysm.  He went ahead and had a CT scan of the chest done with contrast.  It did not  show an aneurysm, it did not show pulmonary embolism, he did not have any  shortness of breath with it.  The pain gradually went away on that side  during the night, and the next day was more left breast tenderness in the  rib area with touch.  He denied any chest pressure or tightness.  His  cardiac enzymes x3 were negative.  EKG did not show any acute changes.  At  the time of discharge, he started developing some soreness in the left MCP  joint, but no other findings at that time.  His lungs were clear, heart  regular, and as stated above, his enzymes were negative.  Ultrasound was  done of his abdomen.  It did not show any enlargement of the liver, common  bile duct, or gallbladder abnormality.  It is felt that the patient is  stable to be discharged to home, and it is felt that it was not consistent  with gallbladder, but if symptoms were to develop over the next few weeks we  would do a HIDA.  He is to follow up next week in the office.  He is  instructed not to resume his Glucophage until Saturday.  He was also  instructed that he may resume his other medications, may use Lodine t.i.d.  for  the discomfort.  In addition, he is to call us if any particular  problems would ensue.   DISCHARGE MEDICATIONS:  1. Altace 10 mg daily.  2. Norvasc 10 mg daily.  3. Aspirin one daily.  4. Glucotrol 10 mg b.i.d.  5. Zocor 20 mg daily.  6. Avandia 8 mg daily.  7. Lodine t.i.d. p.r.n.  8. Glucophage 500 mg b.i.d.  Do not start until Saturday.     ___________________________________________                                         Jonna Coup. Luking,  M.D.   SAL/MEDQ  D:  01/16/2004  T:  01/16/2004  Job:  [id number]   cc:   Vida Roller, M.D.  Fax: [phone]   [facility]

## 2010-10-31 NOTE — H&P (Signed)
NAME:  NICOLE, HAFLEY                        ACCOUNT NO.:  [id number]   MEDICAL RECORD NO.:  [id number]                  PATIENT TYPE:   LOCATION:                                       FACILITY:   PHYSICIAN:  Vida Roller, M.D.                DATE OF BIRTH:  11-26-1940   DATE OF ADMISSION:  01/15/2004  DATE OF DISCHARGE:                                HISTORY & PHYSICAL   PRIMARY CARE PHYSICIAN:  Dr. [name].   HISTORY OF PRESENT ILLNESS:  Mr. Alen is a 70 year old man who I follow  who has noninsulin-dependent diabetes, hyperlipidemia, and chronic  degenerative disk disease in his neck and back who also has nonobstructive  coronary disease and has had a recent heart catheterization and negative  Cardiolite in January of 2005 who presents with sudden onset of severe mid  thoracic back discomfort not associated with exertion, radiating around to  his left arm and into his chest. He cannot get comfortable. It is relatively  severe. He does not have any shortness of breath, but the discomfort is  relatively profound in a squeezing area of discomfort. He reports no  shortness of breath.   CURRENT MEDICATIONS:  1. Avandia 8 mg once a day.  2. Nitrostat p.r.n. for chest pain.  3. Norvasc 10 mg once a day.  4. Altace 5 mg a day.  5. Zocor 20 mg once a day.   PAST MEDICAL HISTORY:  His past medical history also is significant for  detached retinal and thrombocytopenia which is thought to be secondary to  Plavix and aspirin that has been stopped. He also has chest discomfort which  is felt to be noncardiac and may related to reflux esophagitis.   SOCIAL HISTORY:  He does not smoke. He does not drink. 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 He does not exercise on a  regular basis but actively farms and hunts.   FAMILY HISTORY:  His family history is significant for colon cancer and a  history of myocardial infarction but no one else has  diabetes.   PHYSICAL EXAMINATION:  GENERAL:  He is a well-developed, well-nourished,  white male in only mild distress with his chest pain and back pain.  VITAL SIGNS:  He weighs 280 pounds. His blood pressure is 140/80 in his  right arm, 140/76 in his left and 140/90 in his leg. His pulse is 68 and  regular.  CHEST:  His chest is clear to auscultation.  NECK:  He has no jugular venous distention or carotid bruits.  ABDOMEN:  Soft, nontender.  EXTREMITIES:  His lower extremities have 2+ pulses throughout, and there are  no obvious bruits.   STUDIES:  His electrocardiogram is abnormal, but it is unchanged from March  of 2004. He has sinus rhythm with left axis deviation and incomplete right  bundle branch block but no left  ventricular hypertrophy.   ASSESSMENT:  So essentially a gentleman who has the acute onset of  relatively severe mid scapular discomfort which radiates to his chest,  history of diabetes, and hypertension which is concerning obviously for  aortic dissection. He does not have a pulse differential. I do not have a  chest x-ray; however, discussed the case with Dr. [name], and what are  going to do is get a chest CT on him relatively emergently to make sure he  does not have dissection followed by a chest x-ray and will admit him to the  hospital and rule him out for a myocardial infarction, and I will follow him  along with Dr. Gerda Diss. Obviously of any of these evaluations come back  abnormal, we will pursue further evaluation down at [facility].     ___________________________________________                                         Vida Roller, M.D.   JH/MEDQ  D:  01/15/2004  T:  01/15/2004  Job:  [id number]

## 2010-10-31 NOTE — Discharge Summary (Signed)
NAME:  Francisco Powell, Francisco Powell NO.:  [id number]   MEDICAL RECORD NO.:  [id number]          PATIENT TYPE:  INP   LOCATION:  A317                          FACILITY:  [facility]   PHYSICIAN:  Dennie Maizes, M.D.   DATE OF BIRTH:  01/19/1941   DATE OF ADMISSION:  10/21/2008  DATE OF DISCHARGE:  05/11/2010LH                               DISCHARGE SUMMARY   FINAL DIAGNOSES:  Left distal ureteral calculi with obstruction, left  renal colic, left hydronephrosis, and diabetes mellitus.   OPERATIVE PROCEDURE:  Cystoscopy, left retrograde pyelogram, left  ureteroscopic stone extraction, left ureteral stent placement done on  Oct 22, 2008.   ATTENDING PHYSICIAN:  Dennie Maizes, MD   CONSULTING PHYSICIAN:  Scott A. Luking, MD   DISCHARGE SUMMARY:  This 70 year old male has a history of recurrent  urolithiasis in the past.  He was treated for kidney stones last year.  He has undergone extracorporeal shock wave lithotripsy as well as  holmium laser lithotripsy, right ureteral calculi in August 2009.   The patient came to the hospital complaining of severe left flank pain  radiating to the front.  He also had nausea and vomiting.  He was  seen  in the emergency room, evaluation revealed an 8 x 3-mm size left distal  ureteral calculus with obstruction at the level of the ureterovesical  junction.  Hydronephrosis and perinephric stranding.  The patient did  not have adequate pain relief in the ER.  He was admitted to hospital  for pain relief and further treatment.  There is no history of voiding  difficulties, hematuria, fever, chills, or dysuria.   PAST MEDICAL HISTORY:  Recurrent urolithiasis, status post ESL, holmium  laser lithotripsy in 2009, history of hypertension, diabetes mellitus,  asbestosis, hyperlipidemia, coronary artery disease, status post  cholecystectomy, eye surgery, and back surgery.   MEDICATIONS:  1. Metformin 1000 mg 1 p.o. b.i.d.  2. Glipizide XL 10 mg 1 p.o.  b.i.d.  3. Amlodipine 10 mg p.o. daily.  4. Hydrochlorothiazide 12.5 mg p.o. q.a.m.  5. Ramipril 10 mg p.o. 2 daily.  6. Simvastatin 40 mg p.o. daily.  7. Lantus insulin 70 units subcutaneous daily.   ALLERGIES:  Allergic to LIPITOR.   PHYSICAL EXAMINATION:  HEAD, EYES, EARS, NOSE, AND THROAT:  Normal.  LUNGS:  Clear to auscultation.  HEART:  Regular rate and rhythm.  No murmurs.  ABDOMEN:  Soft.  No palpable flank mass.  Mild left costovertebral angle  tenderness was noted.  Bladder was not palpable.  GENITALIA:  Testes normal.  RECTAL:  Not done.   ADMISSION LABS:  CBC:  WBC 12.7, hemoglobin 12.5, hematocrit 38.1,  glucose 179, BUN 21, creatinine 1.28, and calcium 9.3.  Electrolytes  within normal range.  Urinalysis, blood small, nitrite negative,  leukocyte esterase negative.  Microscopic examination, rbc is 11-20 per  high-power field, wbc is 7-10 per high-power field.  CT scan of the  abdomen and pelvis without contrast revealed normal right kidney and  collecting system.  There was a 6 x 7-mm size stone in the lower pole of  left kidney.  Perinephric stranding and hydronephrosis were noted.  There was a stone about 8 x 3 mm size in the left ureterovesical  junction area.   HOSPITAL COURSE:  Dr. [name] was consulted for management of his  medical problems and diabetes mellitus, started on IV fluids and  parenteral narcotics.  An x-ray of KUB area was repeated.  This revealed  an 8 x 3-mm size left distal ureteral calculus, another stone in the  lower pole of the left kidney.  The patient had severe persistent pain  and could not pass the stone.  He was taken to the operating room on Oct 23, 2008.  Cystoscopy, the left retrograde pyelogram, left ureteroscopy  stone extraction, and stent placement was done.  Stone was sent to  [facility].  The patient was found to be doing well.  Postoperatively, he is voiding without any difficulty.  He was  discharged and sent  home on Oct 23, 2008.  He was advised to continue  his regular medications.  He was given Cipro 500 mg p.o. b.i.d. for 7  days and Percocet 5/325 one p.o. dose p.r.n. pain.  It will be reviewed  in the office and the ureteral stent with a string will be removed.  The  condition of the patient at time of discharge is stable.      Dennie Maizes, M.D.  Electronically Signed     SK/MEDQ  D:  12/03/2008  T:  12/04/2008  Job:  [id number]   cc:   Lorin Picket A. Gerda Diss, MD  Fax: [phone]

## 2010-10-31 NOTE — H&P (Signed)
NAME:  Francisco Powell, Francisco Powell NO.:  [id number]   MEDICAL RECORD NO.:  [id number]          PATIENT TYPE:  OBV   LOCATION:  A313                          FACILITY:  [facility]   PHYSICIAN:  Ky Barban, M.D.DATE OF BIRTH:  May 17, 1941   DATE OF ADMISSION:  DATE OF DISCHARGE:  LH                              HISTORY & PHYSICAL   CHIEF COMPLAINT:  Right renal colic.   A 70 years old gentleman came to the emergency room with a right renal  colic.  CT scan showed there is a stone in the right upper ureter  causing obstruction.  The patient has severe pain, so we have re-  admitted him for control of pain and further management.  It should be  mentioned that he had a double-J stent and ESL done for right ureteral  calculus.  Double-J stent was just removed couple of days ago, and no  fever, chills, or any other voiding difficulty.  CT scan is showing 4-mm  stone in the distal ureter, 6-mm stone in the upper ureter.   PAST HISTORY:  Refer to his old record.   REVIEW OF SYSTEMS:  Unremarkable.   PHYSICAL EXAMINATION:  GENERAL:  Moderately obese, not in acute  distress, fully conscious, alert, oriented.  VITAL SIGNS:  Blood pressure 130/80.  Temperature is normal.  CENTRAL NERVOUS SYSTEM:  Negative.  HEAD, NECK, EYE, AND ENT:  Negative.  CHEST:  Symmetrical.  HEART:  Regular sinus rhythm.  ABDOMEN:  Soft.  Mild tenderness, right CVA.  EXTERNAL GENITALIA:  Unremarkable.  RECTAL:  Deferred.  EXTREMITIES:  Normal.   IMPRESSION:  1. Right ureteral calculus.  2. Non-insulin dependent diabetes.   PLAN:  Observation.  Add Flomax, if continued to have pain, we will  consider doing a stone basket.  The patient to be followed by his family  physician, Dr. Gerda Diss.      Ky Barban, M.D.  Electronically Signed     MIJ/MEDQ  D:  02/10/2008  T:  02/11/2008  Job:  [id number]

## 2010-10-31 NOTE — Procedures (Signed)
NAME:  Francisco Powell, BAUMGARDNER NO.:  [id number]   MEDICAL RECORD NO.:  [id number]          PATIENT TYPE:  OUT   LOCATION:  RESP                          FACILITY:  [facility]   PHYSICIAN:  Edward L. Juanetta Gosling, M.D.DATE OF BIRTH:  Jun 15, 1941   DATE OF PROCEDURE:  DATE OF DISCHARGE:                            PULMONARY FUNCTION TEST   1. Spirometry shows no ventilatory defect, but does show evidence of      airflow obstruction most marked in the smaller airways.  2. Lung volumes are normal.  3. Diffusing capacity of lung for carbon monoxide  is normal.  4. Arterial blood gases are normal.      Edward L. Juanetta Gosling, M.D.  Electronically Signed     ELH/MEDQ  D:  11/23/2007  T:  11/23/2007  Job:  [id number]

## 2010-10-31 NOTE — Op Note (Signed)
NAME:  Francisco Powell, Francisco Powell NO.:  [id number]   MEDICAL RECORD NO.:  [id number]          PATIENT TYPE:  AMB   LOCATION:  DAY                           FACILITY:  [facility]   PHYSICIAN:  Dalia Heading, M.D.  DATE OF BIRTH:  [birth year]   DATE OF PROCEDURE:  03/26/2004  DATE OF DISCHARGE:                                 OPERATIVE REPORT   PREOPERATIVE DIAGNOSIS:  Chronic cholecystitis.   POSTOPERATIVE DIAGNOSIS:  Chronic cholecystitis.   OPERATION/PROCEDURE:  Laparoscopic cholecystectomy.   SURGEON:  Dalia Heading, M.D.   ASSISTANT:  Bernerd Limbo. Leona Carry, M.D.   ANESTHESIA:  General endotracheal anesthesia.   INDICATIONS:  The patient is a 70 year old white male who presents with  biliary colic secondary to chronic cholecystitis.  The risks and benefits of  the procedure including bleeding, infection, hepatobiliary injury, and the  possibility of an open procedure were fully explained to the patient, who  gave informed consent.   DESCRIPTION OF PROCEDURE:  The patient is now placed in the supine position.  After induction of general endotracheal anesthesia, the abdomen was prepped  and draped using the usual sterile technique with Betadine.  Surgical site  confirmation was performed.   A supraumbilical incision was made down to the fascia.  Veress needle was  introduced into the abdominal cavity and confirmation of placement was done  using the saline drop test.  The abdomen was then insufflated with 16 mmHg  pressure.  An 11 mm trocar was then introduced into the abdominal cavity  under direct visualization without difficulty.  The patient was placed in  reverse Trendelenburg position and additional 11 mm trocar was placed in the  epigastric region and 5 mm trocars were placed in the right upper quadrant  and right flank regions.  Liver was inspected and noted to be within normal  limits.  The gallbladder was retracted superiorly and laterally.  The  dissection was  begun around the infundibulum of the gallbladder.  The cystic  duct was first identified.  Its juncture to the infundibulum was fully  identified.  Endoclips were placed proximally and distally on the cystic  duct and the cystic duct was divided.  This was likewise done with the  cystic artery.  The gallbladder was then freed away from the gallbladder  fossa using Bovie electrocautery.  The gallbladder was delivered through the  epigastric trocar site using an EndoCatch bag.  The gallbladder fossa was  inspected and no abnormal bleeding or bile leakage was noted.  Surgicel was  placed in the gallbladder fossa.  All fluid and air were then evacuated from  the abdominal cavity prior to removal of the trocars.   All wounds were irrigated with normal saline.  All wounds were injected with  0.5% Sensorcaine.  The supraumbilical fascia as well as a small umbilical  hernia was  reapproximated using 0 Ethibond interrupted sutures.  All skin  incisions were closed using staples.  Betadine ointment and dry sterile  dressings were applied.   All tape and needle counts correct at the end of the procedure.  The  patient  was extubated in the operating room and went back to the recovery room  awake, in stable condition.  Complications - none.  Specimen - gallbladder.  Blood loss minimal.     Mark   MAJ/MEDQ  D:  03/26/2004  T:  03/26/2004  Job:  [id number]   cc:   Lorin Picket A. Gerda Diss, M.D.  [address]  [place]  [place] [address]  Fax: [phone]

## 2010-10-31 NOTE — H&P (Signed)
NAME:  Francisco Powell, Francisco Powell NO.:  [id number]   MEDICAL RECORD NO.:  [id number]          PATIENT TYPE:  INP   LOCATION:  A316                          FACILITY:  [facility]   PHYSICIAN:  Scott A. Gerda Diss, M.D.  DATE OF BIRTH:  [birth year]   DATE OF ADMISSION:  03/14/2004  DATE OF DISCHARGE:  LH                                HISTORY & PHYSICAL   CHIEF COMPLAINT:  Severe epigastric pain discomfort.   HISTORY OF PRESENT ILLNESS:  This gentleman had a sudden onset of severe  abdominal pain and discomfort.  He is a 70 year old, overweight, white male  who has diabetes, and hyperlipidemia, as well as HTN who presents with  severe epigastric pain discomfort, did not radiate into the back just had  severe pain, felt some nausea, no vomiting, no diaphoresis, no sweats.  He  states he is unable to get comfortable because of it.  He states that it is  starting to cause severe pain, and he came to the office.  Upon being seen  it was noted that he is having severe pain.  An EKG was done which did not  show any acute changes.  He was sent on to the ER for further workup.   PAST MEDICAL HISTORY:  1.  NIDDM.  2.  HTN.  3.  Hyperlipidemia.  4.  History of kidney stone.   FAMILY HISTORY:  Colon cancer, hypertension, diabetes, heart disease, and  cholesterol.   SOCIAL HISTORY:  Does not smoke or drink.   ALLERGIES:  LIPITOR causes him problems but he is able to tolerate Zocor.   MEDICATIONS:  1.  Altace 10 mg daily.  2.  Norvasc 10 mg daily.  3.  Aspirin one daily.  4.  Glucotrol 10 mg b.i.d.  5.  Zocor 20 mg daily.  6.  Avandia 8 mg daily.  7.  Glucophage 500 mg b.i.d.   REVIEW OF SYSTEMS:  Negative for headaches, fevers, chills, cough,  congestion, dysuria, hematuria, hematochezia, or hematemesis.   PHYSICAL EXAMINATION:  HEENT:  TMs normal.  Throat normal.  NECK:  Supple.  No masses.  CHEST:  CTA.  Respiratory rate is normal.  HEART:  Regular.  ABDOMEN:  Soft with  epigastric tenderness and discomfort.  Flanks are  nontender on percussion.  Lower abdomen nontender.  EXTREMITIES:  No edema.  SKIN:  Warm and dry.   Amylase is elevated.  Lipase is normal.  White count is normal.  Hemoglobin  is normal.  MET-7 normal.   CT scan is pending.   ASSESSMENT/PLAN:  Pancreatitis.  Bowel rest, ice chips, fluids, recheck  lipase amylase in the morning, followup closely.  May need further  intervention based on CT scan results, also cardiac enzymes were also  ordered as well.      SAL/MEDQ  D:  03/15/2004  T:  03/15/2004  Job:  [id number]

## 2010-10-31 NOTE — H&P (Signed)
NAME:  Francisco Powell, TIFFANY NO.:  [id number]   MEDICAL RECORD NO.:  [id number]          PATIENT TYPE:  AMB   LOCATION:  DAY                           FACILITY:  [facility]   PHYSICIAN:  Dalia Heading, M.D.  DATE OF BIRTH:  11-Jul-1940   DATE OF ADMISSION:  DATE OF DISCHARGE:  LH                                HISTORY & PHYSICAL   CHIEF COMPLAINT:  Chronic cholecystitis.   HISTORY OF PRESENT ILLNESS:  The patient is a 70 year old white male who is  referred for evaluation and treatment of biliary colic secondary to chronic  cholecystitis.  The patient has been having intermittent episodes of right  upper quadrant abdominal pain with radiation to the right flank, nausea,  bloating for many weeks.  He has had intermittent chest pain and cardiac  work up including a stress test done recently have been negative. He had  cardiac catheterization about one year ago which was also negative.  He does  have fatty food intolerance.  No fever, chills or jaundice have been noted.   PAST MEDICAL HISTORY:  Includes non-insulin-dependent diabetes mellitus,  high cholesterol levels, hypertension.   PAST SURGICAL HISTORY:  Back surgery, left eye surgery.   CURRENT MEDICATIONS:  Glucophage, Altace, Glucotrol, Zocor, Norvasc,  Avandia.   ALLERGIES:  SULFA.   REVIEW OF SYMPTOMS:  Patient denies drinking or smoking.  Denies any other  cardiopulmonary difficulties or bleeding disorders except as noted above.   PHYSICAL EXAMINATION:  GENERAL:  The patient is a well-developed, well-  nourished white male in no acute distress.  VITAL SIGNS: Patient is afebrile and vital signs are stable.  HEENT:  Examination reveals no scleral icterus.  LUNGS:  Clear to auscultation with equal breath sounds bilaterally.  HEART:  Examination reveals a regular rate and rhythm without S3, S4 or  murmurs.  ABDOMEN:  Soft with slight tenderness now in the right upper quadrant to  palpation.  No  hepatosplenomegaly, masses or hernias are identified.  Ultrasound of gallbladder is negative.  Hepatobiliary scan reveals chronic  cholecystitis with low gallbladder ejection fraction reproducible symptoms  with a fatty meal.   IMPRESSION:  Chronic cholecystitis.   PLAN:  The patient is scheduled for laparoscopic cholecystectomy on March 26, 2004.  The risks and benefits of the procedure including bleeding,  infection, hepatobiliary injury, the possibility of an open procedure were  all fully explained to the patient, gave informed consent.     Mark   MAJ/MEDQ  D:  03/25/2004  T:  03/25/2004  Job:  [id number]   cc:   Lorin Picket A. Gerda Diss, M.D.  [address]  [place]  [place] [address]  Fax: [phone]

## 2010-10-31 NOTE — Cardiovascular Report (Signed)
NAME:  Francisco Powell, Francisco Powell                        ACCOUNT NO.:  [id number]   MEDICAL RECORD NO.:  [id number]                   PATIENT TYPE:  INP   LOCATION:  2009                                 FACILITY:  [facility]   PHYSICIAN:  Salvadore Farber, M.D. [facility]         DATE OF BIRTH:  04/27/1941   DATE OF PROCEDURE:  08/23/2002  DATE OF DISCHARGE:                              CARDIAC CATHETERIZATION   PROCEDURE:  Left heart catheterization, left ventriculography, coronary  angiography, intravascular ultrasound of the circumflex as part of the CETP  study.   INDICATIONS FOR PROCEDURE:  The patient is a 70 year old gentleman with  diabetes mellitus, hypertension, and dyslipidemia who presents with left arm  discomfort and dyspnea over the past 2 months.  On Sunday, he developed left  arm discomfort at rest.  He was hospitalized.  Electrocardiogram was normal.  Serial cardiac enzymes were also normal.  Based on his multiple risk  factors, he was referred for diagnostic angiography.   PROCEDURAL TECHNIQUE:  Informed consent was obtained.  The patient consented  to participate in the CETP study.  Under 1% lidocaine local anesthesia, a 6-  French sheath was placed in the right femoral artery using the modified  Seldinger technique.  Diagnostic angiography was performed using JL4  catheter for the left coronary artery, and an AL2 catheter for the right  coronary artery.  A pigtail catheter was used to perform ventriculography.  I should note that the right coronary artery has a high anterior takeoff and  was difficult to engage, even with the AL2 catheter.   The patient had consented to participate in the CETP study.  Intravascular  ultrasound of the circumflex was performed.  Anticoagulation was initiated  with 70 units/kilo of heparin.  Adjunctive heparin was given to achieve an  ACT of greater than 250 seconds.  A 6-French CLS4 guide was advanced over  wire and engaged in the ostium of the  left main coronary.  A BMW wire was  advanced in the distal portion of the first obtuse marginal branch.  Automated pullback of the IVUS catheter was performed from the mid portion  of OM1 back into the guide situated in the left main coronary.  Final  angiograms demonstrated no change in the coronary anatomy.  The patient  tolerated the procedure well and was transferred to the holding room in  stable condition.  Sheaths are to be removed there.   COMPLICATIONS:  None.   FINDINGS:  1. LV:  EF 65% without regional wall motion abnormalities; 168/17/29.  2. No aortic stenosis or mitral regurgitation.  3. Left main coronary:  Angiographically normal.  4. LAD:  The LAD is a large vessel wrapping around the apex of the heart and     supplied the distal portion of the inferior wall.  It gives rise to 3     diagonal vessels.  There is a 40% stenosis of the proximal LAD.  There is     a long 60% to 65% stenosis of the distal LAD.  The first 2 diagonal     branches have 80% ostial stenosis.  5. Circumflex:  The circumflex is a moderate-sized vessel, giving rise to a     single large first obtuse marginal branch.  This first obtuse marginal     has a 20% stenosis in its proximal segment.  6. RCA:  The RCA is a large dominant vessel.  There is a 60% stenosis in the     mid vessel overlying the takeoff of the single acute marginal branch.  7. Abdominal aortography:  The abdominal aorta is normal.  There are 2 right     renal arteries.  There is at least moderate stenosis in the upper vessel.     View was obscured by an overlying lumbar branch.  The single left renal     artery is normal.   IMPRESSION/RECOMMENDATIONS:  The patient has moderate stenosis in both the  left anterior descending artery and right coronary artery with negative  electrocardiogram and enzymes and no clear culprit lesion.  I will obtain an  ECG Cardiolite to exclude ischemia.  In the meantime, will treat  aggressively with  medical therapy including the addition of Plavix and ACE  inhibitor.  Statin will be changed to Lipitor 80 mg per day based on the  PROVE-IT study results.                                               Salvadore Farber, M.D. [facility]    WED/MEDQ  D:  08/23/2002  T:  08/23/2002  Job:  [id number]   cc:   Lorin Picket A. Gerda Diss, M.D.  [address], [address]  [place]  [place] [address]  Fax: [phone]   Charlton Haws, M.D. [facility]

## 2010-10-31 NOTE — Procedures (Signed)
NAME:  STETSON, PELAEZ                        ACCOUNT NO.:  [id number]   MEDICAL RECORD NO.:  [id number]                   PATIENT TYPE:  OUT   LOCATION:  RAD                                  FACILITY:  [facility]   PHYSICIAN:  Vida Roller, M.D.                DATE OF BIRTH:  Jun 02, 1941   DATE OF PROCEDURE:  07/03/2003  DATE OF DISCHARGE:                                    STRESS TEST   PROCEDURE:  Adenosine Cardiolite   INDICATIONS:  Mr. Whitmoyer is a 70 year old male with known coronary artery  disease.  He had a catheterization in March of 2004 revealing moderate  coronary artery disease and normal left ventricular function with no wall  motion abnormalities. He had a follow up Adenosine Cardiolite revealing no  ischemia.  He now presents with recurrent episode of chest discomfort which  was relieved by nitroglycerin.   BASELINE DATA:  EKG reveals sinus bradycardia at 57 beats/minute with  nonspecific ST abnormalities.  Blood pressure is 132/80.   60 mg of adenosine was infused over 4 minute protocol with Cardiolite  injected at 3 minutes. The patient reported chest tightness and flushing  which resolved in recovery.  EKG revealed no arrhythmias and no ischemic  changes.  Final images and results are pending MD review.     ________________________________________  ___________________________________________  Jae Dire, P.A. [facility]                      Vida Roller, M.D.   AB/MEDQ  D:  07/03/2003  T:  07/03/2003  Job:  [id number]

## 2010-10-31 NOTE — H&P (Signed)
   NAME:  Francisco Powell, Francisco Powell NO.:  [id number]   MEDICAL RECORD NO.:  [id number]                  PATIENT TYPE:   LOCATION:                                       FACILITY:   PHYSICIAN:  Dalia Heading, M.D.               DATE OF BIRTH:  22-Jun-1940   DATE OF ADMISSION:  DATE OF DISCHARGE:                                HISTORY & PHYSICAL   CHIEF COMPLAINT:  Change in bowel habits, history of colon problems, family  history of colon carcinoma.   HISTORY OF PRESENT ILLNESS:  The patient is a 70 year old white male, status  post a colonoscopy in 2000, who now presents for a followup colonoscopy.  He  has noted some change in bowel habits, though he is taking fiber which has  helped.  He does have a history of colon polyps and a family history of  colon carcinoma.  He is otherwise asymptomatic.   PAST MEDICAL HISTORY:  Includes hypertension, noninsulin-dependent diabetes  mellitus.   PAST SURGICAL HISTORY:  As noted above.  Eye and back surgeries.   CURRENT MEDICATIONS:  Glynase, Norvasc, Altace, Zocor, Vioxx, and a fluid  pill.   ALLERGIES:  No known drug allergies.   REVIEW OF SYSTEMS:  Unremarkable.   PHYSICAL EXAMINATION:  GENERAL:  The patient is a well-developed, well-nourished white male in no  acute distress.  VITAL SIGNS:  He is afebrile.  Vital signs are stable.  LUNGS:  Clear to auscultation with equal breath sounds bilaterally.  HEART:  Reveals regular rate and rhythm without S3, S4, or murmurs.  ABDOMEN:  Soft, nontender, nondistended.  No hepatosplenomegaly, masses, or  rigidity are noted.  RECTAL:  Deferred to the procedure.   IMPRESSION:  1. Change in bowel habits.  2. History of colon polyps.  3. Family history of colon carcinoma.    PLAN:  The patient is scheduled for a colonoscopy on 03/07/02.  The risks and  benefits of the procedure, including bleeding and perforation were fully  explained to the patient, gained formal  consent.                                                 Dalia Heading, M.D.    MAJ/MEDQ  D:  03/02/2002  T:  03/03/2002  Job:  [id number]

## 2010-10-31 NOTE — Discharge Summary (Signed)
NAME:  Francisco Powell, Francisco Powell                        ACCOUNT NO.:  [id number]   MEDICAL RECORD NO.:  [id number]                   PATIENT TYPE:  INP   LOCATION:  2009                                 FACILITY:  [facility]   PHYSICIAN:  Charlton Haws, M.D. [facility]              DATE OF BIRTH:  1941/01/04   DATE OF ADMISSION:  08/21/2002  DATE OF DISCHARGE:  08/25/2002                                 DISCHARGE SUMMARY   DISCHARGE DIAGNOSES:  1. Moderate coronary artery disease.  2. Diabetes mellitus, type 2.  3. Hyperlipidemia.  4. Hypertension.  5. Obesity.  6. Asbestosis.  7. Questionable coagulopathy, continued right groin ooze, status post     catheterization this admission (recommend further work-up as an     outpatient).   PROCEDURES PERFORMED THIS ADMISSION:  1. Cardiac catheterization by Salvadore Farber, M.D., revealing LVEF 65%     without regional wall motion abnormalities.  No aortic stenosis from     mitral regurgitation.  Left main coronary artery normal.  LAD with 40%     stenosis of the proximal vessel, long 60-65% stenosis of distal vessel.     The first two diagonal branches have 80% ostial stenosis.  Circumflex     with first obtuse marginal branch with 20% stenosis in the proximal     segment.  RCA with 60% stenosis in the mid vessel.  Abdominal aortography     revealed two right renal arteries with moderate stenosis in the upper     vessel.  A single left renal artery was normal.  2. 2-D echocardiogram on August 22, 2002, revealing overall left ventricular     systolic function normal and EF 55-65%.  No left ventricular regional     wall motion abnormalities.  Left ventricular wall thickness moderately     increased.  Mild aortic root dilatation.  Mild mitral annular     calcification.  Mild mitral valve regurgitation.  Left atrium mildly     dilated.  Right ventricle mildly dilated.  Right atrium mildly dilated.   HOSPITAL COURSE:  Please refer to the admission history and  physical by  Charlton Haws, M.D., on August 21, 2002, for complete details.  The patient is  a 70 year old male transferred from the [facility] Emergency Room  for evaluation of unstable angina.  He was found to have multiple risk  factors and it was felt that cardiac catheterization was warranted to  further evaluate his symptoms.  This was performed on August 23, 2002, by  Salvadore Farber, M.D.  The results are noted above.  He tolerated the  procedure well.  Following his catheterization, it was noted that he had  some slight groin ooze.  Pressure was held.  An ultrasound was performed of  his right groin that showed no obvious evidence of pseudoaneurysm or AV  fistula.  Dr. Samule Ohm, who performed the patient's catheterization, felt  that  with the negative EKG and negative enzymes and no clear culprit lesion that  a Cardiolite study should be performed to exclude ischemia.  He changed the  patient from Zocor to Lipitor secondary to the PROVIT study results.  He  also added Plavix and Altace.  The adenosine Cardiolite was performed on  August 24, 2002.  These images were negative for ischemia.  On the morning of  August 25, 2002, the patient was seen by Charlton Haws, M.D., and he felt that  the patient was ready for discharge to home.  Regarding his continued groin  ooze, Dr. Eden Emms felt that the patient should be further worked up as an  outpatient.  He questioned whether or not von Willebrand's disease was the  possible etiology.  He recommended that the patient be off aspirin.  The  patient needs close follow-up in our office next week with the physician  assistant to recheck his groin.  He will see Vida Roller, M.D., on September 05, 2002.  Our office will contact him with an appointment.   LABORATORY DATA:  White blood cell count 6900, hemoglobin __________.8,  hematocrit 37.3, platelet count 240,000.  Sodium 141, potassium 3.8,  chloride 107, CO2 28, glucose 91, BUN 14,  creatinine 0.9, calcium 8.7.  Cardiac enzymes negative x 2.  Lipid panel:  Total cholesterol 168,  triglycerides 229, HDL 45, LDL 77.  TSH 2.495.  The chest x-ray on August 21, 2002, showed cardiac enlargement, but no acute pulmonary findings.   DISCHARGE MEDICATIONS:  1. Norvasc 10 mg daily.  2. Glynase 6 mg b.i.d.  3. Avandia 8 mg daily.  4. Altace 5 mg daily.  5. Plavix 75 mg daily.  6. Lipitor 80 mg q.h.s.  7. Nitroglycerin p.r.n. chest pain.   ACTIVITY:  The patient has been advised to do no driving for the next 24  hours and gradually increase his activity.   DIET:  A diabetic diet should be followed.   WOUND CARE:  He should call our office for any concerns over bleeding,  bruising, swelling, drainage, or fevers.   FOLLOW-UP:  The patient will follow up with the physician assistant for  Vida Roller, M.D., in [place], [place], on Thursday or Friday  of next week.  Dr. Dorethea Clan will see the patient on September 05, 2002, in follow-  up.  As noted above, the patient should have a CBC at follow-up with the  P.A. next week.  Further work-up for his groin ooze should be considered as  an outpatient.     Tereso Newcomer, P.A.                        Charlton Haws, M.D. [facility]    SW/MEDQ  D:  08/25/2002  T:  08/26/2002  Job:  [id number]   cc:   Dr. Gerda Diss

## 2010-10-31 NOTE — Discharge Summary (Signed)
   NAME:  Francisco Powell, Francisco Powell                        ACCOUNT NO.:  [id number]   MEDICAL RECORD NO.:  [id number]                   PATIENT TYPE:  INP   LOCATION:  2009                                 FACILITY:  [facility]   PHYSICIAN:  Tereso Newcomer, P.A.                  DATE OF BIRTH:  [birth year]   DATE OF ADMISSION:  08/21/2002  DATE OF DISCHARGE:  08/25/2002                                 DISCHARGE SUMMARY   ADDENDUM:  There is a correction on the patient's discharge medications. He has not  been sent home on Plavix 75 mg a day. This was discontinued in addition to  his aspirin due to the platelet abnormality noted prior to  discharge. As  noted in the discharge summary, he needs further workup to include platelet  studies and a CBC at followup and no aspirin or Plavix should be used until  this issue is resolved.                                               Tereso Newcomer, P.A.    SW/MEDQ  D:  08/25/2002  T:  08/26/2002  Job:  [id number]   cc:   Lorin Picket A. Gerda Diss, M.D.  [address], [address]  [place]  [place] [address]  Fax: [phone]

## 2010-11-13 ENCOUNTER — Other Ambulatory Visit (HOSPITAL_COMMUNITY): Payer: Self-pay | Admitting: Pulmonary Disease

## 2010-11-13 DIAGNOSIS — J61 Pneumoconiosis due to asbestos and other mineral fibers: Secondary | ICD-10-CM

## 2010-11-19 ENCOUNTER — Ambulatory Visit (HOSPITAL_COMMUNITY)
Admission: RE | Admit: 2010-11-19 | Discharge: 2010-11-19 | Disposition: A | Discharge status: Home / Self Care | Payer: Worker's Compensation | Source: Ambulatory Visit | Attending: Pulmonary Disease | Admitting: Pulmonary Disease

## 2010-11-19 DIAGNOSIS — R5381 Other malaise: Secondary | ICD-10-CM | POA: Insufficient documentation

## 2010-11-19 DIAGNOSIS — R5383 Other fatigue: Secondary | ICD-10-CM | POA: Insufficient documentation

## 2010-11-19 DIAGNOSIS — R05 Cough: Secondary | ICD-10-CM | POA: Insufficient documentation

## 2010-11-19 DIAGNOSIS — J61 Pneumoconiosis due to asbestos and other mineral fibers: Secondary | ICD-10-CM | POA: Insufficient documentation

## 2010-11-19 DIAGNOSIS — R059 Cough, unspecified: Secondary | ICD-10-CM | POA: Insufficient documentation

## 2010-11-20 ENCOUNTER — Encounter (HOSPITAL_COMMUNITY): Payer: Medicare Other

## 2011-01-19 ENCOUNTER — Encounter: Payer: Self-pay | Admitting: Cardiology

## 2011-02-17 ENCOUNTER — Ambulatory Visit (INDEPENDENT_AMBULATORY_CARE_PROVIDER_SITE_OTHER): Payer: Medicare Other | Admitting: Cardiology

## 2011-02-17 ENCOUNTER — Encounter: Payer: Self-pay | Admitting: Cardiology

## 2011-02-17 VITALS — BP 150/82 | HR 70 | Resp 14 | Ht 74.0 in | Wt 277.0 lb

## 2011-02-17 DIAGNOSIS — I2510 Atherosclerotic heart disease of native coronary artery without angina pectoris: Secondary | ICD-10-CM

## 2011-02-17 DIAGNOSIS — I4510 Unspecified right bundle-branch block: Secondary | ICD-10-CM

## 2011-02-17 NOTE — Progress Notes (Signed)
HPI Francisco Powell returns today for evaluation of his subclinical coronary disease and multiple risk factors.  He's having no angina or ischemic symptoms. His biggest problem is his chronic back issues. He still gets around pretty good.  Primary care and is checking his blood work managing his risk factors. He does not check his blood pressure. He has not lost any weight.  EKG shows on normal sinus rhythm with left axis deviation and a right bundle branch block with ST segment changes laterally. No acute changes. Past Medical History  Diagnosis Date  . Coronary artery disease     native vessel  . Hyperlipidemia   . Hypertension   . Diabetes mellitus   . Obesity   . Lumbar back pain   . Renal lithiasis   . Asbestosis     lung  . MI (myocardial infarction)   . Anemia, iron deficiency   . Hx of adenomatous colonic polyps     Past Surgical History  Procedure Date  . Holmium laser lithotripsy 2009  . Cholecystectomy     sec biliary dyskinesia  . Eye surgery     secondary to injury detached retina/cataracts  . Back surgery   . Recurrent urolithiasis s/p esl   . Appendectomy     Family History  Problem Relation Age of Onset  . Cancer Mother     colon cancer  . Cancer Sister     breast cancer  . Cancer Maternal Grandmother     gastric cancer    History   Social History  . Marital Status: Married    Spouse Name: N/A    Number of Children: N/A  . Years of Education: N/A   Occupational History  . Not on file.   Social History Main Topics  . Smoking status: Never Smoker   . Smokeless tobacco: Current User    Types: Chew  . Alcohol Use: No  . Drug Use: No  . Sexually Active: Not on file   Other Topics Concern  . Not on file   Social History Narrative  . No narrative on file    Allergies  Allergen Reactions  . Atorvastatin     Current Outpatient Prescriptions  Medication Sig Dispense Refill  . amLODipine (NORVASC) 10 MG tablet Take 10 mg by mouth daily.         Marland Kitchen aspirin 81 MG tablet Take 81 mg by mouth daily.        . insulin glargine (LANTUS) 100 UNIT/ML injection Inject into the skin as directed.        . Liraglutide (VICTOZA) 18 MG/3ML SOLN Inject into the skin as directed.        Marland Kitchen lisinopril (PRINIVIL,ZESTRIL) 20 MG tablet Take 20 mg by mouth daily.        . metFORMIN (GLUCOPHAGE) 1000 MG tablet Take 1,000 mg by mouth 2 (two) times daily.        . pravastatin (PRAVACHOL) 40 MG tablet Take 40 mg by mouth daily.          ROS Negative other than HPI.   PE General Appearance: well developed, well nourished in no acute distress, obese HEENT: symmetrical face, PERRLA, good dentition  Neck: no JVD, thyromegaly, or adenopathy, trachea midline Chest: symmetric without deformity Cardiac: PMI non-displaced, RRR, soft S1, S2, no gallop or murmur Lung: clear to ausculation and percussion Vascular: all pulses full without bruits  Abdominal: nondistended, nontender, good bowel sounds, no HSM, no bruits Extremities: no cyanosis, clubbing or  edema, no sign of DVT, no varicosities  Skin: normal color, no rashes Neuro: alert and oriented x 3, non-focal Pysch: normal affect Filed Vitals:   02/17/11 1135  BP: 150/82  Pulse: 70  Resp: 14  Height: 6\' 2"  (1.88 m)  Weight: 277 lb (125.646 kg)    EKG  Labs and Studies Reviewed.   Lab Results  Component Value Date   WBC 11.1* 10/28/2008   HGB 13.9 06/27/2009   HCT 41.0 06/27/2009   MCV 80.3 10/28/2008   PLT 251 10/28/2008      Chemistry      Component Value Date/Time   NA 141 08/28/2010 1805   K 4.8 08/28/2010 1805   CL 104 08/28/2010 1805   CO2 23 08/28/2010 1805   BUN 14 08/28/2010 1805   CREATININE 0.92 08/28/2010 1805      Component Value Date/Time   CALCIUM 9.3 08/28/2010 1805   ALKPHOS 66 06/26/2009 1100   AST 32 06/26/2009 1100   ALT 26 06/26/2009 1100   BILITOT 0.6 06/26/2009 1100       No results found for this basename: CHOL   No results found for this basename: HDL   No  results found for this basename: LDLCALC   No results found for this basename: TRIG   No results found for this basename: CHOLHDL   No results found for this basename: HGBA1C   Lab Results  Component Value Date   ALT 26 06/26/2009   AST 32 06/26/2009   ALKPHOS 66 06/26/2009   BILITOT 0.6 06/26/2009   No results found for this basename: TSH

## 2011-02-17 NOTE — Patient Instructions (Signed)
Your physician recommends that you schedule a follow-up appointment in: 1 year with Dr. Wall  

## 2011-02-17 NOTE — Assessment & Plan Note (Signed)
Asymptomatic. Continued medical therapy for secondary prevention.

## 2011-03-12 LAB — BLOOD GAS, ARTERIAL
Acid-base deficit: 3.6 — ABNORMAL HIGH
pCO2 arterial: 31.4 — ABNORMAL LOW
pH, Arterial: 7.422

## 2011-03-13 LAB — CBC
Hemoglobin: 11.5 — ABNORMAL LOW
MCHC: 32.9
MCHC: 32
MCHC: 32.3
MCV: 81.8
Platelets: 210
Platelets: 196
Platelets: 251
RBC: 4.75
RDW: 16.2 — ABNORMAL HIGH
WBC: 9.3

## 2011-03-13 LAB — DIFFERENTIAL
Basophils Absolute: 0
Basophils Absolute: 0
Basophils Absolute: 0.2 — ABNORMAL HIGH
Basophils Relative: 0
Basophils Relative: 1
Basophils Relative: 2 — ABNORMAL HIGH
Eosinophils Absolute: 0.1
Eosinophils Relative: 2
Lymphocytes Relative: 28
Lymphs Abs: 2.6
Monocytes Absolute: 1.1 — ABNORMAL HIGH
Monocytes Relative: 11
Neutro Abs: 5.8
Neutro Abs: 5.1
Neutro Abs: 5.7
Neutrophils Relative %: 62
Neutrophils Relative %: 55
Neutrophils Relative %: 77

## 2011-03-13 LAB — URINALYSIS, ROUTINE W REFLEX MICROSCOPIC
Bilirubin Urine: NEGATIVE
Glucose, UA: 500 — AB
Ketones, ur: NEGATIVE
Leukocytes, UA: NEGATIVE
Protein, ur: NEGATIVE
Urobilinogen, UA: 0.2

## 2011-03-13 LAB — BASIC METABOLIC PANEL
BUN: 15
BUN: 9
CO2: 26
CO2: 25
CO2: 32
Calcium: 9.6
Calcium: 8.3 — ABNORMAL LOW
Calcium: 8.6
Chloride: 101
Creatinine, Ser: 0.92
Creatinine, Ser: 0.99
Creatinine, Ser: 1.41
GFR calc Af Amer: >60
GFR calc non Af Amer: >60
Glucose, Bld: 157 — ABNORMAL HIGH
Potassium: 3.8
Sodium: 137

## 2011-03-13 LAB — URINE CULTURE
Colony Count: NO GROWTH
Special Requests: NEGATIVE

## 2011-03-13 LAB — URINE MICROSCOPIC-ADD ON

## 2011-04-10 ENCOUNTER — Other Ambulatory Visit: Payer: Self-pay | Admitting: Orthopedic Surgery

## 2011-04-10 DIAGNOSIS — M5126 Other intervertebral disc displacement, lumbar region: Secondary | ICD-10-CM

## 2011-04-10 DIAGNOSIS — M4800 Spinal stenosis, site unspecified: Secondary | ICD-10-CM

## 2011-04-15 ENCOUNTER — Ambulatory Visit
Admission: RE | Admit: 2011-04-15 | Discharge: 2011-04-15 | Disposition: A | Discharge status: Home / Self Care | Payer: Medicare Other | Source: Ambulatory Visit | Attending: Orthopedic Surgery | Admitting: Orthopedic Surgery

## 2011-04-15 DIAGNOSIS — M4800 Spinal stenosis, site unspecified: Secondary | ICD-10-CM

## 2011-04-15 DIAGNOSIS — M5126 Other intervertebral disc displacement, lumbar region: Secondary | ICD-10-CM

## 2011-04-15 MED ORDER — IOHEXOL 180 MG/ML  SOLN
15.0000 mL | Freq: Once | INTRAMUSCULAR | Status: AC | PRN
  Administered 2011-04-15: 15 mL via INTRATHECAL

## 2011-04-15 MED ORDER — DIAZEPAM 5 MG PO TABS
5.0000 mg | ORAL_TABLET | Freq: Once | ORAL | Status: AC
  Administered 2011-04-15: 5 mg via ORAL

## 2011-04-15 NOTE — Patient Instructions (Signed)

## 2011-04-23 ENCOUNTER — Ambulatory Visit (HOSPITAL_COMMUNITY)
Admission: RE | Admit: 2011-04-23 | Discharge: 2011-04-23 | Disposition: A | Discharge status: Home / Self Care | Payer: Medicare Other | Source: Ambulatory Visit | Attending: Orthopedic Surgery | Admitting: Orthopedic Surgery

## 2011-04-23 DIAGNOSIS — I10 Essential (primary) hypertension: Secondary | ICD-10-CM | POA: Insufficient documentation

## 2011-04-23 DIAGNOSIS — M545 Low back pain, unspecified: Secondary | ICD-10-CM | POA: Insufficient documentation

## 2011-04-23 DIAGNOSIS — R29898 Other symptoms and signs involving the musculoskeletal system: Secondary | ICD-10-CM | POA: Insufficient documentation

## 2011-04-23 DIAGNOSIS — IMO0001 Reserved for inherently not codable concepts without codable children: Secondary | ICD-10-CM | POA: Insufficient documentation

## 2011-04-23 DIAGNOSIS — E119 Type 2 diabetes mellitus without complications: Secondary | ICD-10-CM | POA: Insufficient documentation

## 2011-04-23 NOTE — Progress Notes (Signed)
Physical Therapy Evaluation  Patient Details  Name: Francisco Powell MRN: [id number] Date of Birth: 07/12/1940  Today's Date: 04/23/2011 Time: 0805-0900 Time Calculation (min): 55 min Visit#: 1  of 12   Re-eval: 05/23/11 Assessment Diagnosis: low back pain. Surgical Date: 12/14/07 Next MD Visit: not scheduled Prior Therapy: no  Past Medical History:  Past Medical History  Diagnosis Date  . Coronary artery disease     native vessel  . Hyperlipidemia   . Hypertension   . Diabetes mellitus   . Obesity   . Lumbar back pain   . Renal lithiasis   . Asbestosis     lung  . MI (myocardial infarction)   . Anemia, iron deficiency   . Hx of adenomatous colonic polyps    Past Surgical History:  Past Surgical History  Procedure Date  . Holmium laser lithotripsy 2009  . Cholecystectomy     sec biliary dyskinesia  . Eye surgery     secondary to injury detached retina/cataracts  . Back surgery   . Recurrent urolithiasis s/p esl   . Appendectomy     Subjective Symptoms/Limitations Symptoms: Francisco Powell states that he has had pain in his low back for a long time. There has been no injury or trauma.  He has had three surgeries.  He states that the first surgry was due to removing spurs in the 90's .  He states that the first surgery was successful for a couple of years but then he got to the point he could not walk.  He had a second surgery which was successful for 5-6 years.  He had a third surgery approximately three years ago due to his bone pinching his sciatic nerve.  He was pain freee for several years.  He has been having pain for the past 7-8 monhs now that is progressive in nature.  He states that he has been to several MD's.  He had a study last week and states that the MD stated that surgery would not help.  He is now being referred to therapy to improve his functional abiliity and decrease his pain. Limitations: Sitting;Lifting;Standing;Walking;House hold activities How long  can you sit comfortably?: The patient states that he can sit for 5-10 minutes before he feel as if he needs to shift his weight. How long can you stand comfortably?: The patient states if he does not have something to hold to he can not stand.  He states he has trouble just trying to shave. How long can you walk comfortably?: the patient states that if he has his back brace on he can walk for 10-15 minutes without the brace he could not evern walk 5 minutes. Special Tests: The patient states if he lies on left side he is able to sleep but if he is on his right side or his back he has significant pain. Pain Assessment Currently in Pain?: Yes Pain Score:   6 (worst in the past 10;  best 0) Pain Location: Back Pain Orientation: Right;Lower Pain Type: Chronic pain Pain Radiating Towards: to the calf area. Pain Relieving Factors: lying on his L side.  Pain meds and mm relaxer;   Prior Functioning  Home Living Lives With: Spouse Additional Comments:  (Pt comes to department wearing a lumbar brace.) Prior Function Vocation: Retired Leisure: Hobbies-yes (Comment) Comments: farming   Objective: RLE Strength Right Hip Flexion: 2+/5 Right Hip Extension: 2+/5 Right Hip ABduction: 2+/5 Right Hip ADduction: 2+/5 Right Knee Flexion: 3/5 Right Knee Extension:  4/5 Right Ankle Dorsiflexion: 3+/5 LLE Strength Left Hip Flexion: 5/5 Left Hip Extension: 5/5 Left Hip ABduction: 4/5 Left Hip ADduction: 4/5 Left Knee Flexion: 5/5 Left Knee Extension: 5/5 Left Ankle Dorsiflexion: 5/5 Lumbar AROM Lumbar Flexion: decrease 50% Lumbar Extension: decreased 20% Lumbar - Right Side Bend: decreased 20% Lumbar - Left Side Bend: decreased 20% Lumbar - Right Rotation: decrease 30% Lumbar - Left Rotation: decrease 30%  Exercise/Treatments Stretches Active Hamstring Stretch: 3 reps;30 seconds Single Knee to Chest Stretch: 3 reps;30 seconds Standing Extension: 5 reps   Stability Ab Set: 10 reps     Physical Therapy Assessment and Plan PT Assessment and Plan Clinical Impression Statement: Pt with significant weakness and pain who will benefit from skilled therapy to return pt to prior functional level. Rehab Potential: Good Clinical Impairments Affecting Rehab Potential: deceased ROM, weakness, pain PT Frequency: Min 3X/week PT Duration: 4 weeks PT Treatment/Interventions: Therapeutic exercise PT Plan: Pt to be seen three times a week for four weeks for stability program.  Next treatment add clam, bent knee lift, bridge and UE flexion(arms are at 90 degree flexion, then R back to 180 if possible without back arching, return and repeat witth L),  3rd treatment add sitting IR/ER of hips, floating SLR, hip isometric, fourth add t-Band as well as functional squating, 5th begin SL abductin, clam and heel squeeze, 6th add prone exercises; ......    Goals Home Exercise Program Pt will Perform Home Exercise Program: Independently PT Short Term Goals Time to Complete Short Term Goals: 2 weeks PT Short Term Goal 1: Pt to state pain is no greater than a 7 PT Short Term Goal 2: Pain to be radiating to knee level only PT Long Term Goals Time to Complete Long Term Goals: 4 weeks PT Long Term Goal 1: Pain to be no greater than a 3 PT Long Term Goal 2: ROM to be wnl to allow pt to pick items off the floor safely and easily Long Term Goal 3: Pain level to be no further than pt hip Long Term Goal 4: Patient strength to be increased one grade to allow pt to stand for . to shave without difficulty PT Long Term Goal 5: Pt to wean from abdominal brace  Problem List Patient Active Problem List  Diagnoses  . DM  . HYPERLIPIDEMIA-MIXED  . OBESITY  . ANEMIA, IRON DEFICIENCY  . SMOKELESS TOBACCO ABUSE  . HYPERTENSION, UNSPECIFIED  . CAD, NATIVE VESSEL  . RBBB  . PULMONARY ASBESTOSIS  . BACK PAIN, LUMBAR  . ANOREXIA  . WEIGHT LOSS, ABNORMAL  . Right leg weakness    PT - End of  Session Equipment Utilized During Treatment: Back brace Activity Tolerance: Patient tolerated treatment well General Behavior During Session: [facility] for tasks performed Cognition: [facility] for tasks performed   RUSSELL,CINDY 04/23/2011, 9:27 AM  Physician Documentation Your signature is required to indicate approval of the treatment plan as stated above.  Please sign and either send electronically or make a copy of this report for your files and return this physician signed original.   Please mark one 1.__approve of plan  2. ___approve of plan with the following conditions.   ______________________________  _____________________ Physician Signature                                                                                                             Date

## 2011-04-23 NOTE — Patient Instructions (Addendum)
HEP

## 2011-04-28 ENCOUNTER — Ambulatory Visit (HOSPITAL_COMMUNITY)
Admission: RE | Admit: 2011-04-28 | Discharge: 2011-04-28 | Disposition: A | Discharge status: Home / Self Care | Payer: Medicare Other | Source: Ambulatory Visit | Attending: Family Medicine | Admitting: Family Medicine

## 2011-04-28 NOTE — Progress Notes (Signed)
Physical Therapy Treatment Patient Details  Name: Francisco Powell MRN: [id number] Date of Birth: September 09, 1940  Today's Date: 04/28/2011 Time: 1478-2956 Time Calculation (min): 40 min Visit#: 2  of 12   Re-eval: 05/23/11 Charges: Therex x 38'   Subjective: Symptoms/Limitations Symptoms: Pt reports HEP compliance. Pt also reports he is feeling some better. Pain Assessment Currently in Pain?: Yes Pain Score:   3 (3-4) Pain Location: Back Pain Orientation: Right;Lower   Exercise/Treatments Stretches Active Hamstring Stretch: 3 reps;30 seconds Single Knee to Chest Stretch: 3 reps;30 seconds Standing Extension: 5 reps Stability Clam: 10 reps Bridge: 10 reps Bent Knee Raise: 10 reps Ab Set: 10 reps Single Arm Raise: Limitations Single Arm Raises Limitations: Supine x 10 each 90-180 for core stabilization  Physical Therapy Assessment and Plan PT Assessment and Plan Clinical Impression Statement: Began new stabilization exercises to increase core strength. Pt requires vc's for breathing and abdominal stabilization with exercises. Pt reports no change in pain at end of session. PT Treatment/Interventions: Therapeutic exercise PT Plan: Continue per PT POC. Add sitting IR/ER of hips, floating SLR and  hip isometric next tx.     Problem List Patient Active Problem List  Diagnoses  . DM  . HYPERLIPIDEMIA-MIXED  . OBESITY  . ANEMIA, IRON DEFICIENCY  . SMOKELESS TOBACCO ABUSE  . HYPERTENSION, UNSPECIFIED  . CAD, NATIVE VESSEL  . RBBB  . PULMONARY ASBESTOSIS  . BACK PAIN, LUMBAR  . ANOREXIA  . WEIGHT LOSS, ABNORMAL  . Right leg weakness    PT - End of Session Activity Tolerance: Patient tolerated treatment well General Behavior During Session: [facility] for tasks performed Cognition: [facility] for tasks performed  Antonieta Iba 04/28/2011, 8:49 AM

## 2011-04-30 ENCOUNTER — Ambulatory Visit (HOSPITAL_COMMUNITY)
Admission: RE | Admit: 2011-04-30 | Discharge: 2011-04-30 | Disposition: A | Discharge status: Home / Self Care | Payer: Medicare Other | Source: Ambulatory Visit | Attending: Orthopedic Surgery | Admitting: Orthopedic Surgery

## 2011-04-30 DIAGNOSIS — R29898 Other symptoms and signs involving the musculoskeletal system: Secondary | ICD-10-CM

## 2011-04-30 NOTE — Progress Notes (Signed)
Physical Therapy Treatment Patient Details  Name: Francisco Powell MRN: [id number] Date of Birth: [birth year]  Today's Date: 04/30/2011 Time: 8657-8469 Time Calculation (min): 38 min Visit#: 3  of 12   Re-eval: 05/22/11  charge:  There ex 38, HMP  Subjective: Symptoms/Limitations Symptoms: Pt states he is very stiff this morning.  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 Pain Assessment Currently in Pain?: Yes Pain Score:   3 Pain Location: Back Pain Orientation: Right Pain Type: Chronic pain     Exercise/Treatments Stretches Active Hamstring Stretch: 3 reps;30 seconds Single Knee to Chest Stretch: 3 reps;30 seconds Lower Trunk Rotation: 5 reps;10 seconds Lumbar Exercises   Stability Clam: 10 reps Dead Bug: 5 reps;10 reps Bridge: 15 reps Isometric Hip Flexion: 5 reps Straight Leg Raise: 5 reps (sitting hip IR/ER x5) Machine Exercises   Modalities Modalities: Moist Heat Moist Heat Therapy Number Minutes Moist Heat: 15 Minutes Moist Heat Location:  (back)  Physical Therapy Assessment and Plan PT Assessment and Plan Clinical Impression Statement: Pt needs verbal cuing to perform ab set correctly; pt wants to stick stomach out.  Added sitting IR/ER, floating SLR, trunk rotation and dead bug exercise.  HMP secondary to patient being very stiff from working 4 hours yesterday. PT Plan: add T-band exercises next treatment for postural correction    Goals  no back pain; no radicular pain  Problem List Patient Active Problem List  Diagnoses  . DM  . HYPERLIPIDEMIA-MIXED  . OBESITY  . ANEMIA, IRON DEFICIENCY  . SMOKELESS TOBACCO ABUSE  . HYPERTENSION, UNSPECIFIED  . CAD, NATIVE VESSEL  . RBBB  . PULMONARY ASBESTOSIS  . BACK PAIN, LUMBAR  . ANOREXIA  . WEIGHT LOSS, ABNORMAL  . Right leg weakness    PT - End of Session Activity Tolerance: Patient tolerated treatment well General Behavior During Session: [facility] for  tasks performed Cognition: [facility] for tasks performed  Dennard Vezina,CINDY 04/30/2011, 8:44 AM

## 2011-05-01 ENCOUNTER — Ambulatory Visit (HOSPITAL_COMMUNITY)
Admission: RE | Admit: 2011-05-01 | Discharge: 2011-05-01 | Disposition: A | Discharge status: Home / Self Care | Payer: Medicare Other | Source: Ambulatory Visit | Attending: Physical Therapy | Admitting: Physical Therapy

## 2011-05-01 DIAGNOSIS — R29898 Other symptoms and signs involving the musculoskeletal system: Secondary | ICD-10-CM

## 2011-05-01 NOTE — Progress Notes (Signed)
Physical Therapy Treatment Patient Details  Name: Francisco Powell MRN: [id number] Date of Birth: 11/25/1940  Today's Date: 05/01/2011 Time: 4098-1191 Time Calculation (min): 42 min Visit#: 4  of 12   Re-eval: 05/22/11  There ex 39  Subjective: Symptoms/Limitations Symptoms: Pt states that he is not as stiff as he was.  Pt states he is only able to stand for a few minutes before he has pain running down his leg Pain Assessment Currently in Pain?: Yes Pain Score:   3 Pain Location: Back Pain Orientation: Right   Exercise/Treatments Stretches Lower Trunk Rotation: 5 reps Lumbar Exercises Scapular Retraction: Strengthening;Both;Standing;Theraband Theraband Level (Scapular Retraction): Level 4 (Blue) Row: Strengthening;Both;10 reps;Standing;Theraband Theraband Level (Row): Level 4 (Blue) Shoulder Extension: Strengthening;10 reps;Standing;Theraband Theraband Level (Shoulder Extension): Level 4 (Blue) Stability Clam:  (sitting IR/ER) Dead Bug: 10 reps Bridge: 15 reps Isometric Hip Flexion: 10 reps Straight Leg Raise: 10 reps Hip Abduction: 10 reps     Physical Therapy Assessment and Plan PT Assessment and Plan PT Plan: Continue to progress pt begin prone ex next treatment.    Goals    Problem List Patient Active Problem List  Diagnoses  . DM  . HYPERLIPIDEMIA-MIXED  . OBESITY  . ANEMIA, IRON DEFICIENCY  . SMOKELESS TOBACCO ABUSE  . HYPERTENSION, UNSPECIFIED  . CAD, NATIVE VESSEL  . RBBB  . PULMONARY ASBESTOSIS  . BACK PAIN, LUMBAR  . ANOREXIA  . WEIGHT LOSS, ABNORMAL  . Right leg weakness    PT - End of Session Activity Tolerance: Patient tolerated treatment well General Cognition: [facility] for tasks performed  RUSSELL,CINDY 05/01/2011, 3:22 PM

## 2011-05-05 ENCOUNTER — Ambulatory Visit (HOSPITAL_COMMUNITY)
Admission: RE | Admit: 2011-05-05 | Discharge: 2011-05-05 | Disposition: A | Discharge status: Home / Self Care | Payer: Medicare Other | Source: Ambulatory Visit | Attending: Orthopedic Surgery | Admitting: Orthopedic Surgery

## 2011-05-05 NOTE — Progress Notes (Signed)
Physical Therapy Treatment Patient Details  Name: Francisco Powell MRN: [id number] Date of Birth: 1940/10/20  Today's Date: 05/05/2011 Time: 8295-6213 Time Calculation (min): 35 min Visit#: 5  of 12   Re-eval: 05/22/11 Charges:  therex 34'     Subjective: Symptoms/Limitations Symptoms: Pt. states he is painfree today.  States he has been since Saturday (3 days). Pain Assessment Currently in Pain?: No/denies   Exercise/Treatments Lumbar Exercises Scapular Retraction: Strengthening;Both;Standing;Theraband Theraband Level (Scapular Retraction): Level 4 (Blue) Row: Strengthening;Both;10 reps;Standing;Theraband Theraband Level (Row): Level 4 (Blue) Shoulder Extension: Strengthening;10 reps;Standing;Theraband Theraband Level (Shoulder Extension): Level 4 (Blue) Stability Clam: 15 reps Dead Bug: 10 reps Bridge: 15 reps Isometric Hip Flexion: 10 reps Straight Leg Raise: 10 reps Heel Squeeze: 5 reps;Prone Single Arm Raise: 5 reps;Prone Leg Raise: 5 reps;Prone  Physical Therapy Assessment and Plan PT Assessment and Plan Clinical Impression Statement: improved stability with therex.  Added prone stab exercises with good form.  No need for HMP today; overall improving. PT Treatment/Interventions: Therapeutic exercise PT Plan: Progress clams to sidelying; add sidelying hip abduction as well.     Problem List Patient Active Problem List  Diagnoses  . DM  . HYPERLIPIDEMIA-MIXED  . OBESITY  . ANEMIA, IRON DEFICIENCY  . SMOKELESS TOBACCO ABUSE  . HYPERTENSION, UNSPECIFIED  . CAD, NATIVE VESSEL  . RBBB  . PULMONARY ASBESTOSIS  . BACK PAIN, LUMBAR  . ANOREXIA  . WEIGHT LOSS, ABNORMAL  . Right leg weakness    PT - End of Session Activity Tolerance: Patient tolerated treatment well General Behavior During Session: [facility] for tasks performed Cognition: [facility] for tasks performed  Lurena Nida 05/05/2011, 8:33 AM

## 2011-05-11 ENCOUNTER — Ambulatory Visit (HOSPITAL_COMMUNITY)
Admission: RE | Admit: 2011-05-11 | Discharge: 2011-05-11 | Disposition: A | Discharge status: Home / Self Care | Payer: Medicare Other | Source: Ambulatory Visit | Attending: Orthopedic Surgery | Admitting: Orthopedic Surgery

## 2011-05-11 DIAGNOSIS — R29898 Other symptoms and signs involving the musculoskeletal system: Secondary | ICD-10-CM

## 2011-05-11 NOTE — Progress Notes (Signed)
Physical Therapy Treatment Patient Details  Name: Francisco Powell MRN: [id number] Date of Birth: 10-31-1940  Today's Date: 05/11/2011 Time: 4098-1191 Time Calculation (min): 38 min Visit#: 6  of 12   Re-eval: 05/22/11  us-8;   There ex 30 Subjective: Symptoms/Limitations Symptoms: Pt states he has been hurting significantly since last treatment.  States that he feels he did to many reps of the exercises on his stomach.  States he really did 20 not 10 because he thought the therapist was counting where the therapist thought he was counting. Pain Assessment Currently in Pain?: Yes Pain Score:   6 Pain Location: Leg Pain Orientation: Right      Exercise/Treatments Stretches Passive Hamstring Stretch: 5 reps;60 seconds;Limitations Passive Hamstring Stretch Limitations: therapist assist Single Knee to Chest Stretch: 5 reps;60 seconds;Limitations Single Knee to Chest Stretch Limitations: therapist assit (followed by flexing of the foot x 1' x 5.) Piriformis Stretch: 3 reps;60 seconds;Limitations Piriformis Stretch Limitations: Passive by therapsit. Lumbar Exercises   Stability Clam: 20 reps Bridge: 10 reps    Modalities Modalities: Ultrasound Ultrasound Ultrasound Location: Right sciatic notch Ultrasound Parameters: 1.5 w/cm2 1 MGHz x 8'  Physical Therapy Assessment and Plan PT Assessment and Plan Clinical Impression Statement: Pt pain level decreased by 3 after treatment.  Pt told to only do stretching and ab sets today.  Assess how patient is doing tomorrow.   PT Plan: use 2 pillows with prone exercises and no more than 5 reps.    Goals    Problem List Patient Active Problem List  Diagnoses  . DM  . HYPERLIPIDEMIA-MIXED  . OBESITY  . ANEMIA, IRON DEFICIENCY  . SMOKELESS TOBACCO ABUSE  . HYPERTENSION, UNSPECIFIED  . CAD, NATIVE VESSEL  . RBBB  . PULMONARY ASBESTOSIS  . BACK PAIN, LUMBAR  . ANOREXIA  . WEIGHT LOSS, ABNORMAL  . Right leg weakness    PT -  End of Session Activity Tolerance: Patient tolerated treatment well General Behavior During Session: [facility] for tasks performed Cognition: [facility] for tasks performed  RUSSELL,CINDY 05/11/2011, 12:32 PM

## 2011-05-13 ENCOUNTER — Ambulatory Visit (HOSPITAL_COMMUNITY)
Admission: RE | Admit: 2011-05-13 | Discharge: 2011-05-13 | Disposition: A | Discharge status: Home / Self Care | Payer: Medicare Other | Source: Ambulatory Visit | Attending: Orthopedic Surgery | Admitting: Orthopedic Surgery

## 2011-05-13 DIAGNOSIS — R29898 Other symptoms and signs involving the musculoskeletal system: Secondary | ICD-10-CM

## 2011-05-13 NOTE — Progress Notes (Signed)
Physical Therapy Treatment Patient Details  Name: ZANDON TALTON MRN: [id number] Date of Birth: Feb 02, 1941  Today's Date: 05/13/2011 Time: 1191-4782 Time Calculation (min): 48 min Visit#: 7  of 12   Re-eval: 05/22/11  [place] 8',  EX 39'  Subjective: Symptoms/Limitations Symptoms: I'm moving better.  My pain is about a four this morning.  I'm very tight. Pain Assessment Currently in Pain?: Yes Pain Score:   3 Pain Location: Back Pain Orientation: Right   Exercise/Treatments Stretches Passive Hamstring Stretch: 5 reps;60 seconds;Limitations Passive Hamstring Stretch Limitations: therapist assist Single Knee to Chest Stretch: 5 reps;60 seconds;Limitations Single Knee to Chest Stretch Limitations: therapist assit (followed by flexing of the foot x 1' x 5.) Piriformis Stretch: 3 reps;60 seconds;Limitations Piriformis Stretch Limitations: Passive by therapsit. Lumbar Exercises   Stability Clam: 10 reps Bridge: 10 reps Bent Knee Raise: 10 reps  sitting IR/ER Straight Leg Raise: 5 reps Sidelying AB  Modalities Modalities: Ultrasound Ultrasound Ultrasound Location: sciatic notch areaq  Ultrasound Parameters: 1.5 w/cm2, 1Mg HZx 8' Ultrasound Goals: Pain  Physical Therapy Assessment and Plan PT Assessment and Plan Clinical Impression Statement: Added SL abduction, sitting IR/ER without difficulty Rehab Potential: Good PT Plan: begin prone ex with 3 pillow 5 rep heel squeeze and SLR only    Goals    Problem List Patient Active Problem List  Diagnoses  . DM  . HYPERLIPIDEMIA-MIXED  . OBESITY  . ANEMIA, IRON DEFICIENCY  . SMOKELESS TOBACCO ABUSE  . HYPERTENSION, UNSPECIFIED  . CAD, NATIVE VESSEL  . RBBB  . PULMONARY ASBESTOSIS  . BACK PAIN, LUMBAR  . ANOREXIA  . WEIGHT LOSS, ABNORMAL  . Right leg weakness    PT - End of Session Activity Tolerance: Patient tolerated treatment well General Behavior During Session: [facility] for tasks  performed  Jemell Town,CINDY 05/13/2011, 8:58 AM

## 2011-05-14 ENCOUNTER — Ambulatory Visit (HOSPITAL_COMMUNITY)
Admission: RE | Admit: 2011-05-14 | Discharge: 2011-05-14 | Disposition: A | Discharge status: Home / Self Care | Payer: Medicare Other | Source: Ambulatory Visit | Attending: Orthopedic Surgery | Admitting: Orthopedic Surgery

## 2011-05-14 ENCOUNTER — Ambulatory Visit (HOSPITAL_COMMUNITY): Payer: Medicare Other | Admitting: *Deleted

## 2011-05-14 DIAGNOSIS — R29898 Other symptoms and signs involving the musculoskeletal system: Secondary | ICD-10-CM

## 2011-05-14 NOTE — Progress Notes (Signed)
Physical Therapy Treatment Patient Details  Name: MCKOY BHAKTA MRN: [id number] Date of Birth: 08-22-1940  Today's Date: 05/14/2011 Time: 7829-5621 Time Calculation (min): 43 min Visit#: 8  of 12   Re-eval: 05/22/11  charge [place] 8'; There ex 32  Subjective: Symptoms/Limitations Symptoms: Pt states that he is not having any pain today.   Exercise/Treatments Stretches Passive Hamstring Stretch: 2 reps;60 seconds Single Knee to Chest Stretch: 2 reps;60 seconds Standing Extension: 5 reps;Limitations Standing Extension Limitations: x3 Piriformis Stretch: 1 rep;60 seconds Lumbar Exercises   Stability Bridge: 10 reps Bent Knee Raise: 10 reps Straight Leg Raise: Limitations Straight Leg Raises Limitations: 7 reps Hip Abduction: 10 reps Heel Squeeze: 5 reps;Limitations Heel Squeeze Limitations: 3 pillows Leg Raise: 5 reps;Limitations Leg Raises Limitations: 3 pillows Plank:  (sitting IR/ER x 7)  Modalities Modalities: Ultrasound Ultrasound Ultrasound Location: R low back/sciatic notch Ultrasound Parameters: 1.5 w/cm2 1 MgHZ x 8' Ultrasound Goals: Pain  Physical Therapy Assessment and Plan PT Assessment and Plan Clinical Impression Statement: Added Prone ex without difficulty although pt stated he could feel a putll in his R LE when completing them. Rehab Potential: Good PT Plan: begin SAR and chin tuck head lift next treatmentl    Problem List Patient Active Problem List  Diagnoses  . DM  . HYPERLIPIDEMIA-MIXED  . OBESITY  . ANEMIA, IRON DEFICIENCY  . SMOKELESS TOBACCO ABUSE  . HYPERTENSION, UNSPECIFIED  . CAD, NATIVE VESSEL  . RBBB  . PULMONARY ASBESTOSIS  . BACK PAIN, LUMBAR  . ANOREXIA  . WEIGHT LOSS, ABNORMAL  . Right leg weakness    PT - End of Session Activity Tolerance: Patient tolerated treatment well General Behavior During Session: [facility] for tasks performed Cognition: [facility] for tasks performed  Christopher Glasscock,CINDY 05/14/2011, 10:10 AM

## 2011-05-20 ENCOUNTER — Ambulatory Visit (HOSPITAL_COMMUNITY)
Admission: RE | Admit: 2011-05-20 | Discharge: 2011-05-20 | Disposition: A | Discharge status: Home / Self Care | Payer: Medicare Other | Source: Ambulatory Visit | Attending: Family Medicine | Admitting: Family Medicine

## 2011-05-20 DIAGNOSIS — M545 Low back pain, unspecified: Secondary | ICD-10-CM | POA: Insufficient documentation

## 2011-05-20 DIAGNOSIS — IMO0001 Reserved for inherently not codable concepts without codable children: Secondary | ICD-10-CM | POA: Insufficient documentation

## 2011-05-20 DIAGNOSIS — R29898 Other symptoms and signs involving the musculoskeletal system: Secondary | ICD-10-CM

## 2011-05-20 DIAGNOSIS — E119 Type 2 diabetes mellitus without complications: Secondary | ICD-10-CM | POA: Insufficient documentation

## 2011-05-20 DIAGNOSIS — I10 Essential (primary) hypertension: Secondary | ICD-10-CM | POA: Insufficient documentation

## 2011-05-20 NOTE — Patient Instructions (Signed)
hep

## 2011-05-20 NOTE — Progress Notes (Signed)
Physical Therapy Treatment Patient Details  Name: Francisco Powell MRN: [id number] Date of Birth: [birth year]  Today's Date: 05/20/2011 Time: 4098-1191 Time Calculation (min): 46 min Visit#: 9  of 12   Re-eval: 05/22/11    Subjective: Symptoms/Limitations Symptoms: No pain since Monday      Exercise/Treatments Stretches Passive Hamstring Stretch: 2 reps;60 seconds Single Knee to Chest Stretch: 2 reps;60 seconds Standing Extension: 5 reps;Limitations Standing Extension Limitations: x3 Piriformis Stretch: 1 rep;60 seconds Lumbar Exercises   Stability Bridge: 15 reps Bent Knee Raise: 10 reps Isometric Hip Flexion: 5 reps Straight Leg Raise: Limitations Straight Leg Raises Limitations: 12 Hip Abduction: 10 reps Heel Squeeze: Limitations;10 reps Heel Squeeze Limitations: 3 pillows Single Arm Raise: 5 reps Leg Raise: Limitations;10 reps Leg Raises Limitations: 3 pillows Opposite Arm/Leg Raise: 5 reps Plank:  (sitting IR/ER x 7) Machine Exercises    Modalities Modalities: Ultrasound Ultrasound Ultrasound Location: R sciatic notch Ultrasound Parameters: 1.5 w/cm2 1 mgHz x 8'  Physical Therapy Assessment and Plan      Goals painfree    Problem List Patient Active Problem List  Diagnoses  . DM  . HYPERLIPIDEMIA-MIXED  . OBESITY  . ANEMIA, IRON DEFICIENCY  . SMOKELESS TOBACCO ABUSE  . HYPERTENSION, UNSPECIFIED  . CAD, NATIVE VESSEL  . RBBB  . PULMONARY ASBESTOSIS  . BACK PAIN, LUMBAR  . ANOREXIA  . WEIGHT LOSS, ABNORMAL  . Right leg weakness       RUSSELL,CINDY 05/20/2011, 9:32 AM

## 2011-05-22 ENCOUNTER — Ambulatory Visit (HOSPITAL_COMMUNITY)
Admission: RE | Admit: 2011-05-22 | Discharge: 2011-05-22 | Disposition: A | Discharge status: Home / Self Care | Payer: Medicare Other | Source: Ambulatory Visit | Attending: Orthopedic Surgery | Admitting: Orthopedic Surgery

## 2011-05-22 NOTE — Progress Notes (Signed)
Physical Therapy Evaluation  Patient Details  Name: Francisco Powell MRN: [id number] Date of Birth: January 26, 1941  Today's Date: 05/22/2011 Time: 4098-1191 Time Calculation (min): 45 min Visit#: 10  of 18   Re-eval: 06/21/11 Assessment Diagnosis: low back pain. Surgical Date: 12/14/07 Next MD Visit: not scheduled Prior Therapy: no  Past Medical History:  Past Medical History  Diagnosis Date  . Coronary artery disease     native vessel  . Hyperlipidemia   . Hypertension   . Diabetes mellitus   . Obesity   . Lumbar back pain   . Renal lithiasis   . Asbestosis     lung  . MI (myocardial infarction)   . Anemia, iron deficiency   . Hx of adenomatous colonic polyps    Past Surgical History:  Past Surgical History  Procedure Date  . Holmium laser lithotripsy 2009  . Cholecystectomy     sec biliary dyskinesia  . Eye surgery     secondary to injury detached retina/cataracts  . Back surgery   . Recurrent urolithiasis s/p esl   . Appendectomy     Subjective Symptoms/Limitations Symptoms: Pt states that he is having some pain today.  He was very busy Wed.  How long can you sit comfortably?: The patient states that sitting is no problem now he was having increased pain after 5-10 min. How long can you stand comfortably?: The patient states that he is unable to stand without  holding on if he is standing greater than five minutes.  He states this is better than initial but it is still difficult. How long can you walk comfortably?: The patient states that he was using the back brace all the time now he is using the brace 25% of the time.  He is still having difficulty walking more than five minutes. Pain Assessment Currently in Pain?: Yes Pain Score:   1 (worst in the past week 9/10) Pain Location: Back Pain Orientation: Right Pain Type: Chronic pain Pain Radiating Towards: calf area.  Precautions/Restrictions     Prior Functioning  Home Living Lives With:  Spouse Additional Comments:  (Pt comes to department wearing a lumbar brace.) Prior Function Vocation: Retired Leisure: Hobbies-yes (Comment)  Assessment RLE Strength Right Hip Flexion: 3-/5  Was 2+ Right Hip Extension: 3/5  Was 3+ Right Hip ABduction: 3+/5  Was 2+ Right Hip ADduction: 3+/5  Was 2+ Right Knee Flexion: 4/5  Was 4/5 Right Knee Extension: 4/5 was 4/5 Right Ankle Dorsiflexion: 4/5  Was 3+  LLE Strength Left Hip Flexion: 5/5 Left Hip Extension: 5/5 Left Hip ABduction: 5/5  Was 4/5 Left Hip ADduction: 5/5  Was 4/5 Left Knee Flexion: 5/5 Left Knee Extension: 5/5 Left Ankle Dorsiflexion: 5/5 Lumbar AROM Lumbar Flexion: decrease 30%   Was decreased 50% Lumbar Extension: decreased 20% Lumbar - Right Side Bend: decreased 20% Lumbar - Left Side Bend: decreased 20% Lumbar - Right Rotation: decrease 30% Lumbar - Left Rotation: decrease 30%  Exercise/Treatments Stretches Passive Hamstring Stretch: 1 rep;60 seconds Single Knee to Chest Stretch: 1 rep;60 seconds Standing Extension: 5 reps Prone on Elbows Stretch: 60 seconds Piriformis Stretch: 2 reps;60 seconds;Limitations Piriformis Stretch Limitations: prone and supine Lumbar Exercises   Stability Straight Leg Raise: 10 reps Hip Abduction: 10 reps Leg Raise: 10 reps Plank:  (wall push up x 10)   Modalities Modalities: Ultrasound Manual Therapy Manual Therapy: Other (comment) Other Manual Therapy: massage to gluteal area x 5' Ultrasound Ultrasound Location: R sciatic notch and R lumbar  paraspinal L4-S1 Ultrasound Parameters: 1.5 w/cm2 1 mgHz x 10 Ultrasound Goals: Pain  Physical Therapy Assessment and Plan PT Assessment and Plan Clinical Impression Statement: Pt has improved both subjectively and objectively but still has limitating factors.   Clinical Impairments Affecting Rehab Potential: Pain, strength, decreased ROM; glut tightness. PT Plan: Try [place] with e-stim next visit to see if it decrases  tightness in R gluteal    Goals Home Exercise Program PT Goal: Perform Home Exercise Program - Progress: Met PT Short Term Goals PT Short Term Goal 1 - Progress: Met PT Short Term Goal 2 - Progress: Not met PT Long Term Goals PT Long Term Goal 1 - Progress: Progressing toward goal PT Long Term Goal 2 - Progress: Progressing toward goal Long Term Goal 3 Progress: Not met Long Term Goal 4 Progress: Progressing toward goal Long Term Goal 5 Progress: Partly met  Problem List Patient Active Problem List  Diagnoses  . DM  . HYPERLIPIDEMIA-MIXED  . OBESITY  . ANEMIA, IRON DEFICIENCY  . SMOKELESS TOBACCO ABUSE  . HYPERTENSION, UNSPECIFIED  . CAD, NATIVE VESSEL  . RBBB  . PULMONARY ASBESTOSIS  . BACK PAIN, LUMBAR  . ANOREXIA  . WEIGHT LOSS, ABNORMAL  . Right leg weakness    PT - End of Session Activity Tolerance: Patient tolerated treatment well General Behavior During Session: [facility] for tasks performed   Petrea Fredenburg,CINDY 05/22/2011, 10:07 AM  Physician Documentation Your signature is required to indicate approval of the treatment plan as stated above.  Please sign and either send electronically or make a copy of this report for your files and return this physician signed original.   Please mark one 1.__approve of plan  2. ___approve of plan with the following conditions.   ______________________________                                                          _____________________ Physician Signature                                                                                                             Date

## 2011-05-22 NOTE — Patient Instructions (Addendum)
HEP

## 2011-05-25 ENCOUNTER — Ambulatory Visit (HOSPITAL_COMMUNITY)
Admission: RE | Admit: 2011-05-25 | Discharge: 2011-05-25 | Disposition: A | Discharge status: Home / Self Care | Payer: Medicare Other | Source: Ambulatory Visit | Attending: Orthopedic Surgery | Admitting: Orthopedic Surgery

## 2011-05-25 DIAGNOSIS — R29898 Other symptoms and signs involving the musculoskeletal system: Secondary | ICD-10-CM

## 2011-05-25 NOTE — Progress Notes (Signed)
Physical Therapy Treatment Patient Details  Name: Francisco Powell MRN: [id number] Date of Birth: 1940-12-09  Today's Date: 05/25/2011 Time: 0805-0852 Time Calculation (min): 47 min Visit#: 11  of 18   Re-eval: 06/19/11    Subjective: Symptoms/Limitations Symptoms: Pt states that he is not having any pain today.   Exercise/Treatments Stretches Passive Hamstring Stretch: 1 rep;60 seconds Standing Extension: 5 reps;Limitations Standing Extension Limitations: x2 Piriformis Stretch: 2 reps;60 seconds Piriformis Stretch Limitations:  (Prone and supine) Lumbar Exercises Scapular Retraction: Strengthening;Standing;Theraband Theraband Level (Scapular Retraction): Level 4 (Blue) Row: Strengthening;10 reps Theraband Level (Row): Level 4 (Blue) Shoulder Extension: Strengthening;Both;10 reps;Standing;Theraband Theraband Level (Shoulder Extension): Level 4 (Blue) Shoulder ADduction: Limitations Shoulder Adduction Limitations: wall push up x 10 Stability Straight Leg Raise: Limitations Straight Leg Raises Limitations: x12 Hip Abduction: 20 reps Single Arm Raise: 10 reps Leg Raise: Limitations Leg Raises Limitations: 12 Machine Exercises    Modalities Modalities: Ultrasound Ultrasound Ultrasound Location: R sciatic notch  Ultrasound Parameters: 1.5 w/cm2 1 mghz x 8'  Physical Therapy Assessment and Plan PT Assessment and Plan Clinical Impression Statement: Pt continues to have improved form with exercises.  Attempted [place] with e-stim but e-stim had to be too high for pt to feel went back to US> Rehab Potential: Good PT Frequency: Min 2X/week PT Duration: 4 weeks PT Plan: begin functional ssquat; give pt T-band ex for home use.    Goals    Problem List Patient Active Problem List  Diagnoses  . DM  . HYPERLIPIDEMIA-MIXED  . OBESITY  . ANEMIA, IRON DEFICIENCY  . SMOKELESS TOBACCO ABUSE  . HYPERTENSION, UNSPECIFIED  . CAD, NATIVE VESSEL  . RBBB  . PULMONARY ASBESTOSIS   . BACK PAIN, LUMBAR  . ANOREXIA  . WEIGHT LOSS, ABNORMAL  . Right leg weakness    PT - End of Session Activity Tolerance: Patient tolerated treatment well  RUSSELL,CINDY 05/25/2011, 10:38 AM

## 2011-05-27 ENCOUNTER — Ambulatory Visit (HOSPITAL_COMMUNITY)
Admission: RE | Admit: 2011-05-27 | Discharge: 2011-05-27 | Disposition: A | Discharge status: Home / Self Care | Payer: Medicare Other | Source: Ambulatory Visit | Attending: Orthopedic Surgery | Admitting: Orthopedic Surgery

## 2011-05-27 DIAGNOSIS — R29898 Other symptoms and signs involving the musculoskeletal system: Secondary | ICD-10-CM

## 2011-05-27 NOTE — Patient Instructions (Signed)
HEP T-band given to pt

## 2011-05-27 NOTE — Progress Notes (Signed)
Physical Therapy Treatment Patient Details  Name: DYER KLUG MRN: [id number] Date of Birth: 06/04/1941  Today's Date: 05/27/2011 Time: 9147-8295 Time Calculation (min): 43 min Visit#: 12  of 19   Re-eval: 06/19/11    Subjective: Symptoms/Limitations Symptoms: No pain today.  He states he was having some pain yesterday.  Pt will be killing hogs today which will tell how improved he is. Pain Assessment Currently in Pain?: No/denies      Exercise/Treatments Stretches Passive Hamstring Stretch: 1 rep;60 seconds Piriformis Stretch: 2 reps;60 seconds Piriformis Stretch Limitations:  (Prone and supine) Lumbar Exercises Scapular Retraction: Strengthening;15 reps;Standing;Theraband Theraband Level (Scapular Retraction): Level 4 (Blue) Row: Strengthening;15 reps Theraband Level (Row): Level 4 (Blue) Shoulder Extension: Strengthening;Both;15 reps;Standing;Theraband Theraband Level (Shoulder Extension): Level 4 (Blue) Stability Bridge: 15 reps Straight Leg Raise: 15 reps;Limitations Hip Abduction: 10 reps;Weights Hip Abduction Weights (lbs): 3 Single Arm Raise: 10 reps Leg Raise: 15 reps;Limitations Machine Exercises    Manual Therapy Manual Therapy: Other (comment) Other Manual Therapy: massage to R Lumbar area x 17 min  Physical Therapy Assessment and Plan PT Assessment and Plan Clinical Impression Statement: D/C [place] due to 6 rx; back paraspinal mm very tight; completed a soft tissue massage to decrase tissue tightness. Rehab Potential: Good Clinical Impairments Affecting Rehab Potential: pain, strength PT Plan: Massage instead of ultrasound    Goals    Problem List Patient Active Problem List  Diagnoses  . DM  . HYPERLIPIDEMIA-MIXED  . OBESITY  . ANEMIA, IRON DEFICIENCY  . SMOKELESS TOBACCO ABUSE  . HYPERTENSION, UNSPECIFIED  . CAD, NATIVE VESSEL  . RBBB  . PULMONARY ASBESTOSIS  . BACK PAIN, LUMBAR  . ANOREXIA  . WEIGHT LOSS, ABNORMAL  . Right leg  weakness    PT - End of Session Activity Tolerance: Patient tolerated treatment well General Behavior During Session: [facility] for tasks performed  RUSSELL,CINDY 05/27/2011, 10:22 AM

## 2011-05-29 ENCOUNTER — Ambulatory Visit (HOSPITAL_COMMUNITY): Admission: RE | Admit: 2011-05-29 | Payer: Medicare Other | Source: Ambulatory Visit | Admitting: Physical Therapy

## 2011-06-01 ENCOUNTER — Ambulatory Visit (HOSPITAL_COMMUNITY)
Admission: RE | Admit: 2011-06-01 | Discharge: 2011-06-01 | Disposition: A | Discharge status: Home / Self Care | Payer: Medicare Other | Source: Ambulatory Visit | Attending: Orthopedic Surgery | Admitting: Orthopedic Surgery

## 2011-06-01 DIAGNOSIS — R29898 Other symptoms and signs involving the musculoskeletal system: Secondary | ICD-10-CM

## 2011-06-01 NOTE — Patient Instructions (Signed)
Good body mechanics while lifting.

## 2011-06-01 NOTE — Progress Notes (Signed)
Physical Therapy Treatment Patient Details  Name: Francisco Powell MRN: [id number] Date of Birth: 11-Jul-1940  Today's Date: 06/01/2011 Time: 0802-0845 Time Calculation (min): 43 min Visit#: 13  of 19   Re-eval: 06/19/11    Subjective: Symptoms/Limitations Symptoms: no pain today    Exercise/Treatments Stretches Passive Hamstring Stretch: 1 rep;60 seconds Standing Extension: 10 seconds Piriformis Stretch: 2 reps;60 seconds Piriformis Stretch Limitations:  (Prone and supine) Lumbar Exercises  Straight Leg Raise: 15 reps;Limitations Hip Abduction: 15 reps Hip Abduction Weights (lbs): 3 Leg Raise: 15 reps;Limitations Opposite Arm/Leg Raise: 15 reps Plank:  (wall push up x 10 min.) Lifting: From 12";10 reps;Limitations Lifting Limitations: needs verbal cue for proper techinque.    Manual Therapy Manual Therapy: Other (comment) Other Manual Therapy: massage to R lumbar/gluteal mm with decreased mm tone noted after massage.  Physical Therapy Assessment and Plan PT Assessment and Plan Clinical Impression Statement: added lifting for proper techinque. PT Plan: begin lunging next treatment        Problem List Patient Active Problem List  Diagnoses  . DM  . HYPERLIPIDEMIA-MIXED  . OBESITY  . ANEMIA, IRON DEFICIENCY  . SMOKELESS TOBACCO ABUSE  . HYPERTENSION, UNSPECIFIED  . CAD, NATIVE VESSEL  . RBBB  . PULMONARY ASBESTOSIS  . BACK PAIN, LUMBAR  . ANOREXIA  . WEIGHT LOSS, ABNORMAL  . Right leg weakness    PT - End of Session Activity Tolerance: Patient tolerated treatment well General Behavior During Session: [facility] for tasks performed  Sadia Belfiore,CINDY 06/01/2011, 9:09 AM

## 2011-06-03 ENCOUNTER — Ambulatory Visit (HOSPITAL_COMMUNITY)
Admission: RE | Admit: 2011-06-03 | Discharge: 2011-06-03 | Disposition: A | Discharge status: Home / Self Care | Payer: Medicare Other | Source: Ambulatory Visit | Attending: Orthopedic Surgery | Admitting: Orthopedic Surgery

## 2011-06-03 DIAGNOSIS — R29898 Other symptoms and signs involving the musculoskeletal system: Secondary | ICD-10-CM

## 2011-06-03 NOTE — Progress Notes (Signed)
Physical Therapy Treatment Patient Details  Name: Francisco Powell MRN: [id number] Date of Birth: 1941/02/06  Today's Date: 06/03/2011 Time: 8413-2440 Time Calculation (min): 41 min Visit#: 14  of 20   Re-eval: 06/19/11    Subjective: Symptoms/Limitations Symptoms: Pt states he had increased pain and tightness after lifting activity last treatment.   Exercise/Treatments Stretches Passive Hamstring Stretch: 1 rep;60 seconds Standing Extension: 5 reps;Limitations Standing Extension Limitations: x2 Piriformis Stretch: 1 rep;60 seconds Lumbar Exercises   Stability Hip Abduction: 15 reps;Weights Hip Abduction Weights (lbs): 3 Leg Raise: 15 reps Opposite Arm/Leg Raise: 15 reps Plank:  (wall push up x 12) Wall Slides: 10 reps   Manual Therapy Manual Therapy: Other (comment) Other Manual Therapy: Massage to R side Thoracic to lumbar as well as gluteal.  MM tissue responds well to soft tissue massage.  Physical Therapy Assessment and Plan PT Assessment and Plan Clinical Impression Statement: Worked on functional squats/wall squats due to lifting aggrevating conditin. Rehab Potential: Good PT Plan: Begin lifting again from 16" to waist.as well as lunge and SLS    Goals    Problem List Patient Active Problem List  Diagnoses  . DM  . HYPERLIPIDEMIA-MIXED  . OBESITY  . ANEMIA, IRON DEFICIENCY  . SMOKELESS TOBACCO ABUSE  . HYPERTENSION, UNSPECIFIED  . CAD, NATIVE VESSEL  . RBBB  . PULMONARY ASBESTOSIS  . BACK PAIN, LUMBAR  . ANOREXIA  . WEIGHT LOSS, ABNORMAL  . Right leg weakness    General Behavior During Session: [facility] for tasks performed Cognition: [facility] for tasks performed  Francisco Powell,Francisco Powell 06/03/2011, 9:37 AM

## 2011-06-08 ENCOUNTER — Ambulatory Visit (HOSPITAL_COMMUNITY): Payer: Medicare Other | Admitting: Physical Therapy

## 2011-09-02 ENCOUNTER — Ambulatory Visit (INDEPENDENT_AMBULATORY_CARE_PROVIDER_SITE_OTHER): Payer: Medicare Other | Admitting: Nurse Practitioner

## 2011-09-02 ENCOUNTER — Encounter: Payer: Self-pay | Admitting: Nurse Practitioner

## 2011-09-02 VITALS — BP 170/90 | HR 74 | Ht 74.0 in | Wt 287.0 lb

## 2011-09-02 DIAGNOSIS — I2510 Atherosclerotic heart disease of native coronary artery without angina pectoris: Secondary | ICD-10-CM

## 2011-09-02 DIAGNOSIS — I10 Essential (primary) hypertension: Secondary | ICD-10-CM

## 2011-09-02 MED ORDER — LISINOPRIL 20 MG PO TABS: 20.0000 mg | ORAL_TABLET | Freq: Every day | ORAL | Status: DC

## 2011-09-02 NOTE — Assessment & Plan Note (Signed)
Blood pressure is up. He has had no Lisinopril for the last two days. I have refilled his medicine. I have asked him to check some readings at home and explained that his goal is really closer to 120/80. I would like to see him back in a month. Patient is agreeable to this plan and will call if any problems develop in the interim.

## 2011-09-02 NOTE — Progress Notes (Signed)
[place] Pink Date of Birth: 1940-11-19 Medical Record #[id number]  History of Present Illness: Mr. Bahl is seen back today for a follow up visit. He is seen for Dr. Daleen Squibb. He is here for medicine refills. He is here with his wife. He has known CAD with remote cath in 2004. Last stress test that I can see was in 2008. He has been managed medically. Other issues include HTN, HLD, DM and obesity. He denies chest pain. Says he is not fatigued. Not checking his blood pressure or blood sugars at home. No regular exercise but still works at a [employer]. Ran out of his ACE two days ago. Blood pressure is up here today. His wife is concerned about some indigestion. Seems to be more related to his dietary intake. No actual chest pain.   Current Outpatient Prescriptions on File Prior to Visit  Medication Sig Dispense Refill  . amLODipine (NORVASC) 10 MG tablet Take 10 mg by mouth daily.        Marland Kitchen aspirin 81 MG tablet Take 81 mg by mouth daily.        . insulin glargine (LANTUS) 100 UNIT/ML injection Inject into the skin as directed.        . Liraglutide (VICTOZA) 18 MG/3ML SOLN Inject into the skin as directed.        . metFORMIN (GLUCOPHAGE) 1000 MG tablet Take 1,000 mg by mouth 2 (two) times daily.        . pravastatin (PRAVACHOL) 40 MG tablet Take 40 mg by mouth daily.          Allergies  Allergen Reactions  . Penicillins Hives, Itching and Swelling  . Atorvastatin     Past Medical History  Diagnosis Date  . Coronary artery disease     remote cath in 2004 showing moderate stenosis in LAD and RCA. Last cardiolite in 2008  . Hyperlipidemia   . Hypertension   . Diabetes mellitus   . Obesity   . Lumbar back pain   . Renal lithiasis   . Asbestosis     lung  . Anemia, iron deficiency   . Hx of adenomatous colonic polyps     Past Surgical History  Procedure Date  . Holmium laser lithotripsy 2009  . Cholecystectomy     sec biliary dyskinesia  . Eye surgery     secondary  to injury detached retina/cataracts  . Back surgery   . Recurrent urolithiasis s/p esl   . Appendectomy     History  Smoking status  . Never Smoker   Smokeless tobacco  . Current User  . Types: Chew    History  Alcohol Use No    Family History  Problem Relation Age of Onset  . Cancer Mother     colon cancer  . Cancer Sister     breast cancer  . Cancer Maternal Grandmother     gastric cancer    Review of Systems: The review of systems is positive for indigestion.  He has his labs with his PCP and is due for follow up soon. All other systems were reviewed and are negative.  Physical Exam: BP 170/90  Pulse 74  Ht 6\' 2"  (1.88 m)  Wt 287 lb (130.182 kg)  BMI 36.85 kg/m2 Patient is pleasant and in no acute distress. He is morbidly obese. Skin is warm and dry. Color is normal.  HEENT is unremarkable. Normocephalic/atraumatic. PERRL. Sclera are nonicteric. Neck is supple. No masses. No JVD. Lungs  are clear. Cardiac exam shows a regular rate and rhythm. Abdomen is obese but soft. Extremities are without edema. Gait and ROM are intact. No gross neurologic deficits noted.   LABORATORY DATA:   Assessment / Plan:

## 2011-09-02 NOTE — Assessment & Plan Note (Signed)
Has known CAD. Last stress test was in 2008. Has multiple CV risk factors. He is agreeable to repeat stress testing.

## 2011-09-02 NOTE — Patient Instructions (Addendum)
Stay on your current medicines.  We are going to update your stress test and will try to do this in [place].   We have refilled your medicine  Stay active.  Monitor your blood pressure at home. Goal is to be closer to 120/80.  I would like to see you in about a month to recheck you.  You can use OTC Prilosec for your indigestion.   Call the [facility] office at [phone] if you have any questions, problems or concerns.

## 2011-09-03 ENCOUNTER — Other Ambulatory Visit: Payer: Self-pay | Admitting: *Deleted

## 2011-09-04 ENCOUNTER — Encounter (HOSPITAL_COMMUNITY)
Admission: RE | Admit: 2011-09-04 | Discharge: 2011-09-04 | Disposition: A | Discharge status: Home / Self Care | Payer: Medicare Other | Source: Ambulatory Visit | Attending: Cardiology | Admitting: Cardiology

## 2011-09-04 ENCOUNTER — Encounter (HOSPITAL_COMMUNITY): Payer: Self-pay

## 2011-09-04 ENCOUNTER — Ambulatory Visit (INDEPENDENT_AMBULATORY_CARE_PROVIDER_SITE_OTHER): Payer: Medicare Other

## 2011-09-04 DIAGNOSIS — E785 Hyperlipidemia, unspecified: Secondary | ICD-10-CM | POA: Diagnosis not present

## 2011-09-04 DIAGNOSIS — R9439 Abnormal result of other cardiovascular function study: Secondary | ICD-10-CM | POA: Insufficient documentation

## 2011-09-04 DIAGNOSIS — R079 Chest pain, unspecified: Secondary | ICD-10-CM | POA: Insufficient documentation

## 2011-09-04 DIAGNOSIS — I2510 Atherosclerotic heart disease of native coronary artery without angina pectoris: Secondary | ICD-10-CM | POA: Diagnosis not present

## 2011-09-04 DIAGNOSIS — I10 Essential (primary) hypertension: Secondary | ICD-10-CM | POA: Insufficient documentation

## 2011-09-04 DIAGNOSIS — E119 Type 2 diabetes mellitus without complications: Secondary | ICD-10-CM | POA: Insufficient documentation

## 2011-09-04 MED ORDER — TECHNETIUM TC 99M TETROFOSMIN IV KIT
10.0000 | PACK | Freq: Once | INTRAVENOUS | Status: AC | PRN
  Administered 2011-09-04: 9.9 via INTRAVENOUS

## 2011-09-04 MED ORDER — TECHNETIUM TC 99M TETROFOSMIN IV KIT
30.0000 | PACK | Freq: Once | INTRAVENOUS | Status: AC | PRN
  Administered 2011-09-04: 30 via INTRAVENOUS

## 2011-09-04 NOTE — Progress Notes (Signed)
Stress Lab Nurses Notes - Francisco Powell  Francisco Powell 09/04/2011  Reason for doing test: CAD and HTN Type of test: [name] Nurse performing test: [name], RN Nuclear Medicine Tech: [name] Echo Tech: Not Applicable MD performing test: [name] NP Family MD: [name] Test explained and consent signed: yes IV started: 22g jelco, Saline lock flushed, No redness or edema and Saline lock started in radiology Symptoms: None Treatment/Intervention: None Reason test stopped: protocol completed After recovery IV was: Discontinued via X-ray tech and No redness or edema Patient to return to Nuc. Med at : 12:30 Patient discharged: Home Patient's Condition upon discharge was: stable Comments: During test BP 150/78 & HR 98.  Recovery BP 158/80 & HR 80.   No symptoms.  [name] T

## 2011-09-08 ENCOUNTER — Telehealth: Payer: Self-pay

## 2011-09-08 ENCOUNTER — Other Ambulatory Visit: Payer: Self-pay

## 2011-09-08 DIAGNOSIS — I2510 Atherosclerotic heart disease of native coronary artery without angina pectoris: Secondary | ICD-10-CM

## 2011-09-08 NOTE — Telephone Encounter (Signed)
Pt's wife advised of myoview results and Dr. Vern Claude recommendation to have Echo, she verbalized understanding. Echo scheduled for 3-27./LV

## 2011-09-09 ENCOUNTER — Ambulatory Visit (HOSPITAL_COMMUNITY)
Admission: RE | Admit: 2011-09-09 | Discharge: 2011-09-09 | Disposition: A | Discharge status: Home / Self Care | Payer: Medicare Other | Source: Ambulatory Visit | Attending: Cardiology | Admitting: Cardiology

## 2011-09-09 DIAGNOSIS — E785 Hyperlipidemia, unspecified: Secondary | ICD-10-CM | POA: Insufficient documentation

## 2011-09-09 DIAGNOSIS — I10 Essential (primary) hypertension: Secondary | ICD-10-CM | POA: Diagnosis not present

## 2011-09-09 DIAGNOSIS — I4510 Unspecified right bundle-branch block: Secondary | ICD-10-CM | POA: Insufficient documentation

## 2011-09-09 DIAGNOSIS — I2510 Atherosclerotic heart disease of native coronary artery without angina pectoris: Secondary | ICD-10-CM | POA: Insufficient documentation

## 2011-09-09 DIAGNOSIS — I369 Nonrheumatic tricuspid valve disorder, unspecified: Secondary | ICD-10-CM | POA: Diagnosis not present

## 2011-09-09 NOTE — Progress Notes (Signed)
*  PRELIMINARY RESULTS* Echocardiogram 2D Echocardiogram has been performed.  Francisco Powell 09/09/2011, 8:56 AM

## 2011-09-28 DIAGNOSIS — T07XXXA Unspecified multiple injuries, initial encounter: Secondary | ICD-10-CM | POA: Diagnosis not present

## 2011-09-28 DIAGNOSIS — L02419 Cutaneous abscess of limb, unspecified: Secondary | ICD-10-CM | POA: Diagnosis not present

## 2011-10-05 ENCOUNTER — Ambulatory Visit: Payer: Medicare Other | Admitting: Nurse Practitioner

## 2011-10-14 ENCOUNTER — Encounter: Payer: Self-pay | Admitting: *Deleted

## 2011-10-21 ENCOUNTER — Encounter: Payer: Self-pay | Admitting: Cardiology

## 2011-10-21 ENCOUNTER — Ambulatory Visit (INDEPENDENT_AMBULATORY_CARE_PROVIDER_SITE_OTHER): Payer: Medicare Other | Admitting: Cardiology

## 2011-10-21 VITALS — BP 160/82 | HR 96 | Ht 74.0 in | Wt 283.0 lb

## 2011-10-21 DIAGNOSIS — I10 Essential (primary) hypertension: Secondary | ICD-10-CM | POA: Diagnosis not present

## 2011-10-21 DIAGNOSIS — I2510 Atherosclerotic heart disease of native coronary artery without angina pectoris: Secondary | ICD-10-CM

## 2011-10-21 DIAGNOSIS — E785 Hyperlipidemia, unspecified: Secondary | ICD-10-CM | POA: Diagnosis not present

## 2011-10-21 DIAGNOSIS — E669 Obesity, unspecified: Secondary | ICD-10-CM | POA: Diagnosis not present

## 2011-10-21 DIAGNOSIS — I4510 Unspecified right bundle-branch block: Secondary | ICD-10-CM

## 2011-10-21 MED ORDER — LISINOPRIL 40 MG PO TABS: 40.0000 mg | ORAL_TABLET | Freq: Every day | ORAL | Status: DC

## 2011-10-21 NOTE — Assessment & Plan Note (Signed)
Stable with a recent negative stress Myoview. He has normal left ventricular function. Continue secondary preventative therapy.

## 2011-10-21 NOTE — Patient Instructions (Signed)
Your physician has recommended you make the following change in your medication: Increase Lisinopril to 40mg  daily  Your physician wants you to follow-up in: 6 months. You will receive a reminder letter in the mail two months in advance. If you don't receive a letter, please call our office to schedule the follow-up appointment.

## 2011-10-21 NOTE — Progress Notes (Signed)
HPI [name] returns today for evaluation and management of his nonobstructive coronary disease. He recently had a stress Myoview which showed some inferior Morrison Masser motion abnormalities. There was no ischemia. I obtained an echocardiogram which showed no Ann Bohne motion abnormalities with normal left ventricular systolic function. He had mild LVH.  He has no cardiac complaints today. He took a decongestant this morning and last night for a cold and sinuses. His heart rates running about 96 beats per minute.  Past Medical History  Diagnosis Date  . Coronary artery disease     remote cath in 2004 showing moderate stenosis in LAD and RCA. Last cardiolite in 2008  . Hyperlipidemia   . Hypertension   . Diabetes mellitus   . Obesity   . Lumbar back pain   . Renal lithiasis   . Asbestosis     lung  . Anemia, iron deficiency   . Hx of adenomatous colonic polyps     Current Outpatient Prescriptions  Medication Sig Dispense Refill  . amLODipine (NORVASC) 10 MG tablet Take 10 mg by mouth daily.        Marland Kitchen aspirin 81 MG tablet Take 81 mg by mouth daily.        . insulin glargine (LANTUS) 100 UNIT/ML injection Inject into the skin as directed. 50 units in the am and 100 units in the pm       . Liraglutide (VICTOZA) 18 MG/3ML SOLN Inject into the skin as directed.        Marland Kitchen lisinopril (PRINIVIL,ZESTRIL) 40 MG tablet Take 1 tablet (40 mg total) by mouth daily.  90 tablet  3  . metFORMIN (GLUCOPHAGE) 1000 MG tablet Take 1,000 mg by mouth 2 (two) times daily.        . pravastatin (PRAVACHOL) 40 MG tablet Take 40 mg by mouth daily.        Marland Kitchen DISCONTD: lisinopril (PRINIVIL,ZESTRIL) 20 MG tablet Take 1 tablet (20 mg total) by mouth daily.  90 tablet  2    Allergies  Allergen Reactions  . Penicillins Hives, Itching and Swelling  . Atorvastatin     Family History  Problem Relation Age of Onset  . Colon cancer Mother   . Breast cancer Sister   . Cancer Maternal Grandmother     gastric cancer     History   Social History  . Marital Status: Married    Spouse Name: N/A    Number of Children: N/A  . Years of Education: N/A   Occupational History  . Not on file.   Social History Main Topics  . Smoking status: Never Smoker   . Smokeless tobacco: Current User    Types: Chew  . Alcohol Use: No  . Drug Use: No  . Sexually Active: No   Other Topics Concern  . Not on file   Social History Narrative  . No narrative on file    ROS ALL NEGATIVE EXCEPT THOSE NOTED IN HPI  PE  General Appearance: well developed, well nourished in no acute distress, obese HEENT: symmetrical face, PERRLA, good dentition  Neck: no JVD, thyromegaly, or adenopathy, trachea midline Chest: symmetric without deformity Cardiac: PMI non-displaced, RRR, normal S1, S2, no gallop or murmur Lung: clear to ausculation and percussion Vascular: all pulses full without bruits  Abdominal: nondistended, nontender, good bowel sounds, no HSM, no bruits Extremities: no cyanosis, clubbing or edema, no sign of DVT, no varicosities  Skin: normal color, no rashes Neuro: alert and oriented x 3, non-focal  Pysch: normal affect  EKG  BMET    Component Value Date/Time   NA 141 08/28/2010 1805   K 4.8 08/28/2010 1805   CL 104 08/28/2010 1805   CO2 23 08/28/2010 1805   GLUCOSE 172* 08/28/2010 1805   BUN 14 08/28/2010 1805   CREATININE 0.92 08/28/2010 1805   CALCIUM 9.3 08/28/2010 1805   GFRNONAA >60 06/26/2009 1100   GFRAA  Value: >60        The eGFR has been calculated using the MDRD equation. This calculation has not been validated in all clinical situations. eGFR's persistently <60 mL/min signify possible Chronic Kidney Disease. 06/26/2009 1100    Lipid Panel  No results found for this basename: chol, trig, hdl, cholhdl, vldl, ldlcalc    CBC    Component Value Date/Time   WBC 11.1* 10/28/2008 0023   RBC 4.87 10/28/2008 0023   HGB 13.9 06/27/2009 0838   HCT 41.0 06/27/2009 0838   PLT 251 10/28/2008 0023   MCV  80.3 10/28/2008 0023   MCHC 33.4 10/28/2008 0023   RDW 15.8* 10/28/2008 0023   LYMPHSABS 3.2 10/28/2008 0023   MONOABS 1.0 10/28/2008 0023   EOSABS 0.1 10/28/2008 0023   BASOSABS 0.1 10/28/2008 0023

## 2011-10-21 NOTE — Assessment & Plan Note (Signed)
Not well controlled. Increase lisinopril to 40 mg a day.

## 2011-10-22 DIAGNOSIS — J42 Unspecified chronic bronchitis: Secondary | ICD-10-CM | POA: Diagnosis not present

## 2011-10-22 DIAGNOSIS — J310 Chronic rhinitis: Secondary | ICD-10-CM | POA: Diagnosis not present

## 2011-11-11 DIAGNOSIS — Z79899 Other long term (current) drug therapy: Secondary | ICD-10-CM | POA: Diagnosis not present

## 2011-11-11 DIAGNOSIS — E782 Mixed hyperlipidemia: Secondary | ICD-10-CM | POA: Diagnosis not present

## 2011-11-11 DIAGNOSIS — Z125 Encounter for screening for malignant neoplasm of prostate: Secondary | ICD-10-CM | POA: Diagnosis not present

## 2011-11-11 DIAGNOSIS — E119 Type 2 diabetes mellitus without complications: Secondary | ICD-10-CM | POA: Diagnosis not present

## 2011-11-19 ENCOUNTER — Ambulatory Visit (HOSPITAL_COMMUNITY)
Admission: RE | Admit: 2011-11-19 | Discharge: 2011-11-19 | Disposition: A | Discharge status: Home / Self Care | Payer: Medicare Other | Source: Ambulatory Visit | Attending: Chiropractic Medicine | Admitting: Chiropractic Medicine

## 2011-11-19 ENCOUNTER — Other Ambulatory Visit (HOSPITAL_COMMUNITY): Payer: Self-pay | Admitting: Chiropractic Medicine

## 2011-11-19 DIAGNOSIS — M25559 Pain in unspecified hip: Secondary | ICD-10-CM | POA: Insufficient documentation

## 2011-11-19 DIAGNOSIS — M51379 Other intervertebral disc degeneration, lumbosacral region without mention of lumbar back pain or lower extremity pain: Secondary | ICD-10-CM | POA: Insufficient documentation

## 2011-11-19 DIAGNOSIS — M549 Dorsalgia, unspecified: Secondary | ICD-10-CM

## 2011-11-19 DIAGNOSIS — M5137 Other intervertebral disc degeneration, lumbosacral region: Secondary | ICD-10-CM | POA: Insufficient documentation

## 2011-11-19 DIAGNOSIS — M47817 Spondylosis without myelopathy or radiculopathy, lumbosacral region: Secondary | ICD-10-CM | POA: Diagnosis not present

## 2011-11-19 DIAGNOSIS — M545 Low back pain, unspecified: Secondary | ICD-10-CM | POA: Insufficient documentation

## 2011-11-22 ENCOUNTER — Encounter (HOSPITAL_COMMUNITY): Payer: Self-pay | Admitting: *Deleted

## 2011-11-22 ENCOUNTER — Emergency Department (INDEPENDENT_AMBULATORY_CARE_PROVIDER_SITE_OTHER)
Admission: EM | Admit: 2011-11-22 | Discharge: 2011-11-22 | Disposition: A | Discharge status: Home / Self Care | Payer: Medicare Other | Source: Home / Self Care | Attending: Family Medicine | Admitting: Family Medicine

## 2011-11-22 DIAGNOSIS — M5430 Sciatica, unspecified side: Secondary | ICD-10-CM | POA: Diagnosis not present

## 2011-11-22 DIAGNOSIS — M5431 Sciatica, right side: Secondary | ICD-10-CM

## 2011-11-22 HISTORY — DX: Sciatica, right side: M54.31

## 2011-11-22 MED ORDER — PREDNISONE 10 MG PO TABS: 50.0000 mg | ORAL_TABLET | Freq: Every day | ORAL | Status: DC

## 2011-11-22 MED ORDER — HYDROMORPHONE HCL PF 1 MG/ML IJ SOLN
INTRAMUSCULAR | Status: AC
  Filled 2011-11-22: qty 2

## 2011-11-22 MED ORDER — HYDROMORPHONE HCL PF 1 MG/ML IJ SOLN
2.0000 mg | Freq: Once | INTRAMUSCULAR | Status: AC
  Administered 2011-11-22: 2 mg via INTRAMUSCULAR

## 2011-11-22 NOTE — ED Provider Notes (Signed)
Medical screening examination/treatment/procedure(s) were performed by non-physician practitioner and as supervising physician I was immediately available for consultation/collaboration.   MORENO-COLL,Mahalie Kanner; MD   Florean Hoobler Moreno-Coll, MD 11/22/11 1536 

## 2011-11-22 NOTE — Discharge Instructions (Signed)
Follow up with your doctor tomorrow.  Watch your blood sugars closely- the prednisone will make your blood sugar high.    Sciatica Sciatica is a name for lower back pain caused by pressure on the sciatic nerve. The back pain can spread to the buttocks and back of the leg. HOME CARE   Rest as much as possible.   Only take medicine as told by your doctor.   Apply cold or heat to your back as told by your doctor.   Do not bend, lift, or sit for a long time until your pain is better.   Do not do anything that makes the condition worse.   Start normal activity when the pain is better.   Keep all follow-up visits.  GET HELP RIGHT AWAY IF:   There is more pain.   There is weakness or numbness in the legs.   You cannot control when you poop (bowel movement) or pee (urinate).  MAKE SURE YOU:   Understand these instructions.   Will watch this condition.   Will get help right away if you are not doing well or get worse.  Document Released: 03/10/2008 Document Revised: 05/21/2011 Document Reviewed: 03/10/2008 [facility] Patient Information 2012 [place], [place].

## 2011-11-22 NOTE — ED Provider Notes (Signed)
History     CSN: [id number]  Arrival date & time 11/22/11  1134   None     Chief Complaint  Patient presents with  . Hip Pain  . Sciatica    (Consider location/radiation/quality/duration/timing/severity/associated sxs/prior treatment) HPI Comments: Pt has f/u with pcp tomorrow. Daughter is RN, requests steroids and "pain shot" for her father until he can see pcp.  Pt reports pain is not relieved by current tx plan and he is "miserable". Reports pain is in R buttock and radiates down back of R leg.   Patient is a 71 y.o. male presenting with hip pain. The history is provided by the patient and a relative.  Hip Pain This is a new problem. The current episode started more than 2 days ago. The problem occurs constantly. The problem has been gradually worsening. The symptoms are aggravated by standing (sitting). Relieved by: lying on abdomen. Treatments tried: was rx muscle relaxers and percocet by pcp, chiropractor. The treatment provided no relief.    Past Medical History  Diagnosis Date  . Coronary artery disease     remote cath in 2004 showing moderate stenosis in LAD and RCA. Last cardiolite in 2008  . Hyperlipidemia   . Hypertension   . Diabetes mellitus   . Obesity   . Lumbar back pain   . Renal lithiasis   . Asbestosis     lung  . Anemia, iron deficiency   . Hx of adenomatous colonic polyps   . Sciatica of right side     Past Surgical History  Procedure Date  . Holmium laser lithotripsy 2009  . Cholecystectomy     sec biliary dyskinesia  . Eye surgery     secondary to injury detached retina/cataracts  . Back surgery   . Recurrent urolithiasis s/p esl   . Appendectomy   . Tonsillectomy     Family History  Problem Relation Age of Onset  . Colon cancer Mother   . Breast cancer Sister   . Cancer Maternal Grandmother     gastric cancer    History  Substance Use Topics  . Smoking status: Never Smoker   . Smokeless tobacco: Current User    Types: Chew  .  Alcohol Use: No      Review of Systems  Constitutional: Negative for fever and chills.  Musculoskeletal:       Sciatica pain R side  Neurological: Negative for weakness and numbness.       Sciatica pain    Allergies  Penicillins and Atorvastatin  Home Medications   Current Outpatient Rx  Name Route Sig Dispense Refill  . CHLORZOXAZONE 500 MG PO TABS Oral Take 500 mg by mouth 3 (three) times daily as needed.    . OXYCODONE-ACETAMINOPHEN 5-500 MG PO CAPS Oral Take 1 capsule by mouth every 6 (six) hours as needed.    Marland Kitchen AMLODIPINE BESYLATE 10 MG PO TABS Oral Take 10 mg by mouth daily.      . ASPIRIN 81 MG PO TABS Oral Take 81 mg by mouth daily.      . INSULIN GLARGINE 100 UNIT/ML [place] SOLN Subcutaneous Inject into the skin as directed. 50 units in the am and 100 units in the pm     . LIRAGLUTIDE 18 MG/3ML [place] SOLN Subcutaneous Inject into the skin as directed.      Marland Kitchen LISINOPRIL 40 MG PO TABS Oral Take 1 tablet (40 mg total) by mouth daily. 90 tablet 3  . METFORMIN HCL 1000  MG PO TABS Oral Take 1,000 mg by mouth 2 (two) times daily.      Marland Kitchen PRAVASTATIN SODIUM 40 MG PO TABS Oral Take 40 mg by mouth daily.      Marland Kitchen PREDNISONE 10 MG PO TABS Oral Take 5 tablets (50 mg total) by mouth daily. 25 tablet 0    BP 149/80  Pulse 77  Temp(Src) 98.3 F (36.8 C) (Oral)  Resp 17  SpO2 96%  Physical Exam  Constitutional: He is oriented to person, place, and time. He appears well-developed and well-nourished.       Appears to be in pain; sitting in wheelchair with weight shifted to L side  Pulmonary/Chest: Effort normal.  Neurological: He is alert and oriented to person, place, and time.    ED Course  Procedures (including critical care time)  Labs Reviewed - No data to display No results found.   1. Sciatica of right side       MDM  Discussed appropriate pain med dose with pt and daughter; pt is not opiate naive, has taken large amounts of dilaudid previously for kidney stones with  no problems.  Discussed how prednisone will impact blood sugars.  Pt's blood sugars usually well controlled, typically in low 100's-110's.  Rn daughter will help pt monitor and manage blood sugar.         Cathlyn Parsons, NP 11/22/11 1314

## 2011-11-22 NOTE — ED Notes (Signed)
Pt is here with complaints of 10/10 right hip pain that radiates down right leg.  Pt was seen twice this week by his PCP for this and was diagnosed with sciatica.  Pt was prescribed Victoza, Chlorzoxazone, and oxycodone.  Pt has also seen his chiropractor at [facility] every day this week.

## 2011-11-23 DIAGNOSIS — M5430 Sciatica, unspecified side: Secondary | ICD-10-CM | POA: Diagnosis not present

## 2011-11-23 DIAGNOSIS — E119 Type 2 diabetes mellitus without complications: Secondary | ICD-10-CM | POA: Diagnosis not present

## 2011-12-01 DIAGNOSIS — M5430 Sciatica, unspecified side: Secondary | ICD-10-CM | POA: Diagnosis not present

## 2011-12-01 DIAGNOSIS — E119 Type 2 diabetes mellitus without complications: Secondary | ICD-10-CM | POA: Diagnosis not present

## 2012-02-17 ENCOUNTER — Ambulatory Visit (INDEPENDENT_AMBULATORY_CARE_PROVIDER_SITE_OTHER): Payer: Medicare Other | Admitting: Cardiology

## 2012-02-17 ENCOUNTER — Encounter: Payer: Self-pay | Admitting: Cardiology

## 2012-02-17 VITALS — BP 182/88 | HR 54 | Ht 72.0 in | Wt 284.8 lb

## 2012-02-17 DIAGNOSIS — I10 Essential (primary) hypertension: Secondary | ICD-10-CM

## 2012-02-17 DIAGNOSIS — E785 Hyperlipidemia, unspecified: Secondary | ICD-10-CM | POA: Diagnosis not present

## 2012-02-17 DIAGNOSIS — I2510 Atherosclerotic heart disease of native coronary artery without angina pectoris: Secondary | ICD-10-CM | POA: Diagnosis not present

## 2012-02-17 DIAGNOSIS — I4510 Unspecified right bundle-branch block: Secondary | ICD-10-CM

## 2012-02-17 DIAGNOSIS — E669 Obesity, unspecified: Secondary | ICD-10-CM

## 2012-02-17 DIAGNOSIS — F17200 Nicotine dependence, unspecified, uncomplicated: Secondary | ICD-10-CM

## 2012-02-17 MED ORDER — FUROSEMIDE 40 MG PO TABS: 40.0000 mg | ORAL_TABLET | Freq: Every day | ORAL | Status: DC

## 2012-02-17 NOTE — Patient Instructions (Signed)
Your physician has recommended you make the following change in your medication: Start Lasix (furosemide) 40 mg 1 tablet each morning.   Your physician recommends that you return for lab work on Friday September 13,2013 to the [facility] lab in [place] for a bmp.   Your physician recommends that you weigh, daily, at the same time every day, and in the same amount of clothing. Please record your daily weights on the handout provided and bring it to your next appointment.   Your physician has requested that you regularly monitor and record your blood pressure readings at home. Please use the same machine at the same time of day to check your readings and record them to bring to your follow-up visit. Your goal blood pressure is less than 140/90  Your physician wants you to follow-up in: 6 months with Dr. Daleen Squibb. You will receive a reminder letter in the mail two months in advance. If you  don't receive a letter, please call our office to schedule the follow-up appointment.  Foods Rich in Potassium Food / Potassium (mg)  Apricots, dried,  cup / 378 mg   Apricots, raw, 1 cup halves / 401 mg   Avocado,  / 487 mg   Banana, 1 large / 487 mg   Beef, lean, round, 3 oz / 202 mg   Cantaloupe, 1 cup cubes / 427 mg   Dates, medjool, 5 whole / 835 mg   Ham, cured, 3 oz / 212 mg   Lentils, dried,  cup / 458 mg   Lima beans, frozen,  cup / 258 mg   Orange, 1 large / 333 mg   Orange juice, 1 cup / 443 mg   Peaches, dried,  cup / 398 mg   Peas, split, cooked,  cup / 355 mg   Potato, boiled, 1 medium / 515 mg   Prunes, dried, uncooked,  cup / 318 mg   Raisins,  cup / 309 mg   Salmon, pink, raw, 3 oz / 275 mg   Sardines, canned , 3 oz / 338 mg   Tomato, raw, 1 medium / 292 mg   Tomato juice, 6 oz / 417 mg   [place], 3 oz / 349 mg  Document Released: 06/01/2005 Document Revised: 02/11/2011 Document Reviewed: 10/15/2008 [facility] Patient Information 2012 [place], [place].

## 2012-02-17 NOTE — Progress Notes (Signed)
HPI Francisco Powell returns today for evaluation and management of his history of coronary disease. His biggest issue right now is his blood pressure. He also complains of lower extremity edema. He denies orthopnea, PND. He's having no angina. He is in constant pain from his right hip. His been taking a fair amount of Aleve.  His amlodipine, lisinopril, and atenolol are maxed out with resting heart rate 54. He denies any presyncope or syncope. He does add salt to tomatoes otherwise he is very careful.  Past Medical History  Diagnosis Date  . Coronary artery disease     remote cath in 2004 showing moderate stenosis in LAD and RCA. Last cardiolite in 2008  . Hyperlipidemia   . Hypertension   . Diabetes mellitus   . Obesity   . Lumbar back pain   . Renal lithiasis   . Asbestosis     lung  . Anemia, iron deficiency   . Hx of adenomatous colonic polyps   . Sciatica of right side     Current Outpatient Prescriptions  Medication Sig Dispense Refill  . amLODipine (NORVASC) 10 MG tablet Take 10 mg by mouth daily.        Marland Kitchen aspirin 81 MG tablet Take 81 mg by mouth daily.        Marland Kitchen atenolol (TENORMIN) 25 MG tablet Take 25 mg by mouth 2 (two) times daily.      Marland Kitchen gabapentin (NEURONTIN) 100 MG capsule Take 100 mg by mouth 3 (three) times daily.      . Insulin Aspart (NOVOLOG FLEXPEN [place]) Inject into the skin 3 (three) times daily. 03/30/23      . insulin glargine (LANTUS) 100 UNIT/ML injection Inject into the skin as directed. 110 Units Daily      . lisinopril (PRINIVIL,ZESTRIL) 40 MG tablet Take 1 tablet (40 mg total) by mouth daily.  90 tablet  3  . metFORMIN (GLUCOPHAGE) 1000 MG tablet Take 1,000 mg by mouth 2 (two) times daily.        . pravastatin (PRAVACHOL) 40 MG tablet Take 40 mg by mouth daily.          Allergies  Allergen Reactions  . Penicillins Hives, Itching and Swelling  . Atorvastatin     Family History  Problem Relation Age of Onset  . Colon cancer Mother   . Breast cancer Sister     . Cancer Maternal Grandmother     gastric cancer    History   Social History  . Marital Status: Married    Spouse Name: N/A    Number of Children: N/A  . Years of Education: N/A   Occupational History  . Not on file.   Social History Main Topics  . Smoking status: Never Smoker   . Smokeless tobacco: Current User    Types: Chew  . Alcohol Use: No  . Drug Use: No  . Sexually Active: No   Other Topics Concern  . Not on file   Social History Narrative  . No narrative on file    ROS ALL NEGATIVE EXCEPT THOSE NOTED IN HPI  PE  General Appearance: well developed, well nourished in no acute distress HEENT: symmetrical face, PERRLA, good dentition  Neck: no JVD, thyromegaly, or adenopathy, trachea midline Chest: symmetric without deformity Cardiac: PMI non-displaced, RRR, normal S1, S2, no gallop or murmur Lung: clear to ausculation and percussion Vascular: all pulses full without bruits  Abdominal: nondistended, nontender, good bowel sounds, no HSM, no bruits Extremities: no cyanosis,  clubbing , 2+ pitting edema, no sign of DVT, no varicosities  Skin: normal color, no rashes Neuro: alert and oriented x 3, non-focal Pysch: normal affect  EKG  BMET    Component Value Date/Time   NA 141 08/28/2010 1805   K 4.8 08/28/2010 1805   CL 104 08/28/2010 1805   CO2 23 08/28/2010 1805   GLUCOSE 172* 08/28/2010 1805   BUN 14 08/28/2010 1805   CREATININE 0.92 08/28/2010 1805   CALCIUM 9.3 08/28/2010 1805   GFRNONAA >60 06/26/2009 1100   GFRAA  Value: >60        The eGFR has been calculated using the MDRD equation. This calculation has not been validated in all clinical situations. eGFR's persistently <60 mL/min signify possible Chronic Kidney Disease. 06/26/2009 1100    Lipid Panel  No results found for this basename: chol, trig, hdl, cholhdl, vldl, ldlcalc    CBC    Component Value Date/Time   WBC 11.1* 10/28/2008 0023   RBC 4.87 10/28/2008 0023   HGB 13.9 06/27/2009 0838    HCT 41.0 06/27/2009 0838   PLT 251 10/28/2008 0023   MCV 80.3 10/28/2008 0023   MCHC 33.4 10/28/2008 0023   RDW 15.8* 10/28/2008 0023   LYMPHSABS 3.2 10/28/2008 0023   MONOABS 1.0 10/28/2008 0023   EOSABS 0.1 10/28/2008 0023   BASOSABS 0.1 10/28/2008 0023

## 2012-02-17 NOTE — Assessment & Plan Note (Signed)
Poorly controlled. He is probably 8-10 pounds fluid overloaded. Will begin Lasix 40 mg by mouth every morning. Check electrolytes and renal function week to 10 days. Goal blood pressure 140/90 or less.

## 2012-02-26 ENCOUNTER — Other Ambulatory Visit: Payer: Self-pay | Admitting: Cardiology

## 2012-02-26 DIAGNOSIS — I10 Essential (primary) hypertension: Secondary | ICD-10-CM | POA: Diagnosis not present

## 2012-02-26 LAB — BASIC METABOLIC PANEL
CO2: 26 mEq/L (ref 19–32)
Glucose, Bld: 292 mg/dL — ABNORMAL HIGH (ref 70–99)
Potassium: 4.3 mEq/L (ref 3.5–5.3)
Sodium: 139 mEq/L (ref 135–145)

## 2012-03-03 DIAGNOSIS — H2589 Other age-related cataract: Secondary | ICD-10-CM | POA: Diagnosis not present

## 2012-03-03 DIAGNOSIS — H31009 Unspecified chorioretinal scars, unspecified eye: Secondary | ICD-10-CM | POA: Diagnosis not present

## 2012-03-03 DIAGNOSIS — Z23 Encounter for immunization: Secondary | ICD-10-CM | POA: Diagnosis not present

## 2012-03-03 DIAGNOSIS — Z961 Presence of intraocular lens: Secondary | ICD-10-CM | POA: Diagnosis not present

## 2012-03-03 DIAGNOSIS — E119 Type 2 diabetes mellitus without complications: Secondary | ICD-10-CM | POA: Diagnosis not present

## 2012-03-07 ENCOUNTER — Telehealth: Payer: Self-pay | Admitting: Cardiology

## 2012-03-07 NOTE — Telephone Encounter (Signed)
Patient called stated someone from office has already called him,already taken care of.

## 2012-03-07 NOTE — Telephone Encounter (Signed)
Pt returning nurse Debbie call, he can be reached at  [phone]

## 2012-06-03 DIAGNOSIS — I10 Essential (primary) hypertension: Secondary | ICD-10-CM | POA: Diagnosis not present

## 2012-06-03 DIAGNOSIS — E119 Type 2 diabetes mellitus without complications: Secondary | ICD-10-CM | POA: Diagnosis not present

## 2012-06-03 DIAGNOSIS — E785 Hyperlipidemia, unspecified: Secondary | ICD-10-CM | POA: Diagnosis not present

## 2012-06-15 HISTORY — PX: POSTERIOR LUMBAR FUSION: SHX6036

## 2012-07-20 ENCOUNTER — Other Ambulatory Visit: Payer: Self-pay | Admitting: Family Medicine

## 2012-07-20 DIAGNOSIS — E119 Type 2 diabetes mellitus without complications: Secondary | ICD-10-CM | POA: Diagnosis not present

## 2012-07-20 DIAGNOSIS — M5430 Sciatica, unspecified side: Secondary | ICD-10-CM | POA: Diagnosis not present

## 2012-07-20 DIAGNOSIS — I10 Essential (primary) hypertension: Secondary | ICD-10-CM | POA: Diagnosis not present

## 2012-07-20 DIAGNOSIS — M545 Low back pain: Secondary | ICD-10-CM | POA: Diagnosis not present

## 2012-07-21 ENCOUNTER — Ambulatory Visit (HOSPITAL_COMMUNITY)
Admission: RE | Admit: 2012-07-21 | Discharge: 2012-07-21 | Disposition: A | Discharge status: Home / Self Care | Payer: Medicare Other | Source: Ambulatory Visit | Attending: Family Medicine | Admitting: Family Medicine

## 2012-07-21 DIAGNOSIS — R209 Unspecified disturbances of skin sensation: Secondary | ICD-10-CM | POA: Diagnosis not present

## 2012-07-21 DIAGNOSIS — M51379 Other intervertebral disc degeneration, lumbosacral region without mention of lumbar back pain or lower extremity pain: Secondary | ICD-10-CM | POA: Diagnosis not present

## 2012-07-21 DIAGNOSIS — M5137 Other intervertebral disc degeneration, lumbosacral region: Secondary | ICD-10-CM | POA: Diagnosis not present

## 2012-07-21 DIAGNOSIS — M545 Low back pain, unspecified: Secondary | ICD-10-CM | POA: Insufficient documentation

## 2012-07-21 DIAGNOSIS — E119 Type 2 diabetes mellitus without complications: Secondary | ICD-10-CM | POA: Diagnosis not present

## 2012-07-21 DIAGNOSIS — M5126 Other intervertebral disc displacement, lumbar region: Secondary | ICD-10-CM | POA: Insufficient documentation

## 2012-07-21 DIAGNOSIS — M25559 Pain in unspecified hip: Secondary | ICD-10-CM | POA: Insufficient documentation

## 2012-07-21 DIAGNOSIS — M5430 Sciatica, unspecified side: Secondary | ICD-10-CM

## 2012-07-21 DIAGNOSIS — M47817 Spondylosis without myelopathy or radiculopathy, lumbosacral region: Secondary | ICD-10-CM | POA: Diagnosis not present

## 2012-07-21 DIAGNOSIS — Z79899 Other long term (current) drug therapy: Secondary | ICD-10-CM | POA: Diagnosis not present

## 2012-07-21 DIAGNOSIS — E782 Mixed hyperlipidemia: Secondary | ICD-10-CM | POA: Diagnosis not present

## 2012-07-21 MED ORDER — GADOBENATE DIMEGLUMINE 529 MG/ML IV SOLN
20.0000 mL | Freq: Once | INTRAVENOUS | Status: AC | PRN
  Administered 2012-07-21: 20 mL via INTRAVENOUS

## 2012-07-21 MED ORDER — GADOPENTETATE DIMEGLUMINE 469.01 MG/ML IV SOLN: 20.0000 mL | Freq: Once | INTRAVENOUS | Status: AC | PRN

## 2012-07-21 NOTE — Progress Notes (Signed)
Blood sample obtained from left arm IV for Creatnine level.  

## 2012-07-25 LAB — POCT I-STAT, CHEM 8
BUN: 20 mg/dL (ref 6–23)
Chloride: 110 mEq/L (ref 96–112)
Sodium: 142 mEq/L (ref 135–145)

## 2012-08-10 DIAGNOSIS — M5430 Sciatica, unspecified side: Secondary | ICD-10-CM | POA: Diagnosis not present

## 2012-08-10 DIAGNOSIS — E119 Type 2 diabetes mellitus without complications: Secondary | ICD-10-CM | POA: Diagnosis not present

## 2012-08-10 DIAGNOSIS — I10 Essential (primary) hypertension: Secondary | ICD-10-CM | POA: Diagnosis not present

## 2012-08-16 DIAGNOSIS — M7130 Other bursal cyst, unspecified site: Secondary | ICD-10-CM | POA: Diagnosis not present

## 2012-08-16 DIAGNOSIS — M47817 Spondylosis without myelopathy or radiculopathy, lumbosacral region: Secondary | ICD-10-CM | POA: Diagnosis not present

## 2012-08-16 DIAGNOSIS — M4310 Spondylolisthesis, site unspecified: Secondary | ICD-10-CM | POA: Diagnosis not present

## 2012-08-24 ENCOUNTER — Encounter: Payer: Self-pay | Admitting: Physician Assistant

## 2012-08-24 ENCOUNTER — Ambulatory Visit (INDEPENDENT_AMBULATORY_CARE_PROVIDER_SITE_OTHER): Payer: Medicare Other | Admitting: Physician Assistant

## 2012-08-24 VITALS — BP 140/80 | HR 64 | Ht 72.0 in | Wt 296.0 lb

## 2012-08-24 DIAGNOSIS — I10 Essential (primary) hypertension: Secondary | ICD-10-CM | POA: Diagnosis not present

## 2012-08-24 DIAGNOSIS — I2510 Atherosclerotic heart disease of native coronary artery without angina pectoris: Secondary | ICD-10-CM

## 2012-08-24 DIAGNOSIS — Z01810 Encounter for preprocedural cardiovascular examination: Secondary | ICD-10-CM | POA: Diagnosis not present

## 2012-08-24 DIAGNOSIS — E785 Hyperlipidemia, unspecified: Secondary | ICD-10-CM

## 2012-08-24 NOTE — Patient Instructions (Addendum)
YOU HAVE A FOLLOW UP APPT WITH DR. WALL ON 11/23/12 @ 2:30  NO CHANGES

## 2012-08-24 NOTE — Progress Notes (Signed)
[address] [place], [place]  [address] Phone: [phone], Fax:  [phone]  Date:  08/24/2012   ID:  N[place], Walston 1940-11-07, MRN [id number]  PCP:  [name], MD  Primary Cardiologist:  Dr. Valera Castle     History of Present Illness: ICKER SWIGERT is a 72 y.o. male who returns for surgical clearance. He has lumbar spondylosis and spondylolisthesis and needs surgery for correction with Dr. Trey Sailors in [place].  He has a hx of CAD, DM2, HTN, HL.  LHC 3/10: EF 65%, pLAD 40%, dLAD 60-65%, oD2 80%, pOM1 20%, mRCA 60%. Myoview 3/13: Inferolateral defect consistent with soft tissue attenuation versus scar, no definite ischemia, EF 42%.  Echocardiogram 08/2011: Mild LVH, EF 60-65%, normal wall motion, and grade 1 diastolic dysfunction, aortic root dimension 40 mm, aortic root mildly dilated, trivial MR, mild LAE, mild TR. Last seen by Dr. Daleen Squibb 9/13.  Since then, he denies chest pain, dyspnea, syncope, orthopnea, PND, edema. His back pain limits him significantly.  He probably cannot achieve 4 METs.  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  He had not chest pain with this.    Labs (2/14):  K 4.3, creatinine 0.90, Hgb 12.6  Wt Readings from Last 3 Encounters:  08/24/12 296 lb (134.265 kg)  02/17/12 284 lb 12.8 oz (129.184 kg)  10/21/11 283 lb (128.368 kg)     Past Medical History  Diagnosis Date  . Coronary artery disease      a.  LHC 3/10: EF 65%, pLAD 40%, dLAD 60-65%, oD2 80%, pOM1 20%, mRCA 60%; b. Myoview 3/13: Inferolateral defect consistent with soft tissue attenuation versus scar, no definite ischemia, EF 42%;  c.  Echocardiogram 08/2011: Mild LVH, EF 60-65%, normal wall motion, and grade 1 diastolic dysfunction, aortic root dimension 40 mm, aortic root mildly dilated, trivial MR, mild LAE, mild TR  . Hyperlipidemia   . Hypertension   . Diabetes mellitus   . Obesity   . Lumbar back pain     spondylosis  and spondylolisthesis   . Renal lithiasis   . Asbestosis     lung  . Anemia, iron deficiency   . Hx of adenomatous colonic polyps   . Sciatica of right side     Current Outpatient Prescriptions  Medication Sig Dispense Refill  . amLODipine (NORVASC) 10 MG tablet Take 10 mg by mouth daily.        Marland Kitchen aspirin 81 MG tablet Take 81 mg by mouth daily.        Marland Kitchen atenolol (TENORMIN) 25 MG tablet Take 25 mg by mouth 2 (two) times daily.      . furosemide (LASIX) 40 MG tablet Take 1 tablet (40 mg total) by mouth daily.  90 tablet  3  . gabapentin (NEURONTIN) 300 MG capsule Take 300 mg by mouth 3 (three) times daily.      Marland Kitchen HYDROcodone-acetaminophen (NORCO) 10-325 MG per tablet Take 1 tablet by mouth as needed for pain.      . Insulin Aspart (NOVOLOG FLEXPEN Carl) Inject into the skin 3 (three) times daily. 03/30/23      . insulin glargine (LANTUS) 100 UNIT/ML injection Inject into the skin as directed. 110 Units bid      . lisinopril (PRINIVIL,ZESTRIL) 40 MG tablet Take 1 tablet (40 mg total) by mouth daily.  90 tablet  3  . metFORMIN (GLUCOPHAGE) 1000 MG tablet Take  1,000 mg by mouth 2 (two) times daily.        . pravastatin (PRAVACHOL) 40 MG tablet Take 40 mg by mouth daily.         No current facility-administered medications for this visit.    Allergies:    Allergies  Allergen Reactions  . Penicillins Hives, Itching and Swelling  . Atorvastatin     Social History:  The patient  reports that he has never smoked. His smokeless tobacco use includes Chew. He reports that he does not drink alcohol or use illicit drugs.   ROS:  Please see the history of present illness.      All other systems reviewed and negative.   PHYSICAL EXAM: VS:  BP 140/80  Pulse 64  Ht 6' (1.829 m)  Wt 296 lb (134.265 kg)  BMI 40.14 kg/m2 Well nourished, well developed, in no acute distress HEENT: normal Neck: no JVD Cardiac:  normal S1, S2; RRR; no murmur Lungs:  clear to auscultation bilaterally, no wheezing,  rhonchi or rales Abd: soft, nontender, no hepatomegaly Ext: trace bilateral LE edema Skin: warm and dry Neuro:  CNs 2-12 intact, no focal abnormalities noted  EKG:  NSR, HR 64, LAD, RBBB, no change from prior tracing     ASSESSMENT AND PLAN:  1. Surgical Clearance:  He does not have any unstable cardiac conditions.  He likely cannot achieve 4 METs but had been very active up until the last several weeks.  He is unable to do much now due to his back pain.  He had a low risk myoview one year ago.  EF has remained normal by Echo.  I reviewed with Dr. Verne Carrow (DOD).  No further cardiac evaluation is necessary prior to his non-cardiac surgery.  He should continue his beta blocker throughout the perioperative period to reduce the risk of cardiovascular complications.  He may stop ASA for his surgery and resume after his procedure when felt to be safe.  Our service is available in the perioperative as necessary. 2. Coronary Artery Disease:  No angina.  Continue ASA and statin. 3. Hypertension:  Controlled. Continue current Rx. 4. Hyperlipidemia:  Continue statin. 5. Disposition:  Follow up with Dr. Valera Castle in 3 mos.   Signed, Tereso Newcomer, PA-C  3:07 PM 08/24/2012

## 2012-09-02 DIAGNOSIS — M7130 Other bursal cyst, unspecified site: Secondary | ICD-10-CM | POA: Diagnosis not present

## 2012-09-02 DIAGNOSIS — M47817 Spondylosis without myelopathy or radiculopathy, lumbosacral region: Secondary | ICD-10-CM | POA: Diagnosis not present

## 2012-09-02 DIAGNOSIS — M4310 Spondylolisthesis, site unspecified: Secondary | ICD-10-CM | POA: Diagnosis not present

## 2012-09-15 ENCOUNTER — Telehealth: Payer: Self-pay | Admitting: *Deleted

## 2012-09-15 NOTE — Telephone Encounter (Signed)
Decrease lantus to 110. If possible check levels when this occurs.  Let us know if continues

## 2012-09-15 NOTE — Telephone Encounter (Signed)
Blood sugar has dropped 2-3 times this month about 1 hour after eating. Not sure of the blood sugar # but pt knows when it drops because he starts sweating he takes victoza before meals and lantus 120 u bid. Wants to know if he should cut down on the insulin.

## 2012-09-15 NOTE — Telephone Encounter (Signed)
discusse with pt to use 110 u of lantus and call if still having low spells.

## 2012-09-20 ENCOUNTER — Ambulatory Visit: Payer: Medicare Other | Admitting: Cardiology

## 2012-09-21 DIAGNOSIS — M47817 Spondylosis without myelopathy or radiculopathy, lumbosacral region: Secondary | ICD-10-CM | POA: Diagnosis not present

## 2012-09-21 DIAGNOSIS — Z79899 Other long term (current) drug therapy: Secondary | ICD-10-CM | POA: Diagnosis not present

## 2012-09-21 DIAGNOSIS — I4510 Unspecified right bundle-branch block: Secondary | ICD-10-CM | POA: Diagnosis not present

## 2012-09-21 DIAGNOSIS — I2510 Atherosclerotic heart disease of native coronary artery without angina pectoris: Secondary | ICD-10-CM | POA: Diagnosis not present

## 2012-09-21 DIAGNOSIS — Z794 Long term (current) use of insulin: Secondary | ICD-10-CM | POA: Diagnosis not present

## 2012-09-21 DIAGNOSIS — Z01818 Encounter for other preprocedural examination: Secondary | ICD-10-CM | POA: Diagnosis not present

## 2012-09-21 DIAGNOSIS — I252 Old myocardial infarction: Secondary | ICD-10-CM | POA: Diagnosis not present

## 2012-09-21 DIAGNOSIS — E119 Type 2 diabetes mellitus without complications: Secondary | ICD-10-CM | POA: Diagnosis not present

## 2012-09-21 DIAGNOSIS — I10 Essential (primary) hypertension: Secondary | ICD-10-CM | POA: Diagnosis not present

## 2012-09-21 DIAGNOSIS — Z7982 Long term (current) use of aspirin: Secondary | ICD-10-CM | POA: Diagnosis not present

## 2012-09-21 DIAGNOSIS — R918 Other nonspecific abnormal finding of lung field: Secondary | ICD-10-CM | POA: Diagnosis not present

## 2012-09-26 DIAGNOSIS — I2510 Atherosclerotic heart disease of native coronary artery without angina pectoris: Secondary | ICD-10-CM | POA: Diagnosis not present

## 2012-09-26 DIAGNOSIS — Z79899 Other long term (current) drug therapy: Secondary | ICD-10-CM | POA: Diagnosis not present

## 2012-09-26 DIAGNOSIS — I252 Old myocardial infarction: Secondary | ICD-10-CM | POA: Diagnosis not present

## 2012-09-26 DIAGNOSIS — E669 Obesity, unspecified: Secondary | ICD-10-CM | POA: Diagnosis present

## 2012-09-26 DIAGNOSIS — Z809 Family history of malignant neoplasm, unspecified: Secondary | ICD-10-CM | POA: Diagnosis not present

## 2012-09-26 DIAGNOSIS — G589 Mononeuropathy, unspecified: Secondary | ICD-10-CM | POA: Diagnosis not present

## 2012-09-26 DIAGNOSIS — E119 Type 2 diabetes mellitus without complications: Secondary | ICD-10-CM | POA: Diagnosis not present

## 2012-09-26 DIAGNOSIS — Z981 Arthrodesis status: Secondary | ICD-10-CM | POA: Diagnosis not present

## 2012-09-26 DIAGNOSIS — M539 Dorsopathy, unspecified: Secondary | ICD-10-CM | POA: Diagnosis not present

## 2012-09-26 DIAGNOSIS — Z6837 Body mass index (BMI) 37.0-37.9, adult: Secondary | ICD-10-CM | POA: Diagnosis not present

## 2012-09-26 DIAGNOSIS — E7800 Pure hypercholesterolemia, unspecified: Secondary | ICD-10-CM | POA: Diagnosis not present

## 2012-09-26 DIAGNOSIS — M47817 Spondylosis without myelopathy or radiculopathy, lumbosacral region: Secondary | ICD-10-CM | POA: Diagnosis not present

## 2012-09-26 DIAGNOSIS — I10 Essential (primary) hypertension: Secondary | ICD-10-CM | POA: Diagnosis not present

## 2012-09-26 DIAGNOSIS — M25559 Pain in unspecified hip: Secondary | ICD-10-CM | POA: Diagnosis not present

## 2012-09-26 DIAGNOSIS — Z794 Long term (current) use of insulin: Secondary | ICD-10-CM | POA: Diagnosis not present

## 2012-10-01 DIAGNOSIS — I10 Essential (primary) hypertension: Secondary | ICD-10-CM | POA: Diagnosis not present

## 2012-10-01 DIAGNOSIS — IMO0001 Reserved for inherently not codable concepts without codable children: Secondary | ICD-10-CM | POA: Diagnosis not present

## 2012-10-01 DIAGNOSIS — Z4789 Encounter for other orthopedic aftercare: Secondary | ICD-10-CM | POA: Diagnosis not present

## 2012-10-01 DIAGNOSIS — E669 Obesity, unspecified: Secondary | ICD-10-CM | POA: Diagnosis not present

## 2012-10-01 DIAGNOSIS — E119 Type 2 diabetes mellitus without complications: Secondary | ICD-10-CM | POA: Diagnosis not present

## 2012-10-04 DIAGNOSIS — Z4789 Encounter for other orthopedic aftercare: Secondary | ICD-10-CM | POA: Diagnosis not present

## 2012-10-04 DIAGNOSIS — I10 Essential (primary) hypertension: Secondary | ICD-10-CM | POA: Diagnosis not present

## 2012-10-04 DIAGNOSIS — M549 Dorsalgia, unspecified: Secondary | ICD-10-CM | POA: Diagnosis not present

## 2012-10-04 DIAGNOSIS — E669 Obesity, unspecified: Secondary | ICD-10-CM | POA: Diagnosis not present

## 2012-10-04 DIAGNOSIS — IMO0001 Reserved for inherently not codable concepts without codable children: Secondary | ICD-10-CM | POA: Diagnosis not present

## 2012-10-04 DIAGNOSIS — Z981 Arthrodesis status: Secondary | ICD-10-CM | POA: Diagnosis not present

## 2012-10-04 DIAGNOSIS — M47817 Spondylosis without myelopathy or radiculopathy, lumbosacral region: Secondary | ICD-10-CM | POA: Diagnosis not present

## 2012-10-04 DIAGNOSIS — E119 Type 2 diabetes mellitus without complications: Secondary | ICD-10-CM | POA: Diagnosis not present

## 2012-10-04 DIAGNOSIS — M4310 Spondylolisthesis, site unspecified: Secondary | ICD-10-CM | POA: Diagnosis not present

## 2012-10-06 DIAGNOSIS — I10 Essential (primary) hypertension: Secondary | ICD-10-CM | POA: Diagnosis not present

## 2012-10-06 DIAGNOSIS — E119 Type 2 diabetes mellitus without complications: Secondary | ICD-10-CM | POA: Diagnosis not present

## 2012-10-06 DIAGNOSIS — E669 Obesity, unspecified: Secondary | ICD-10-CM | POA: Diagnosis not present

## 2012-10-06 DIAGNOSIS — M47817 Spondylosis without myelopathy or radiculopathy, lumbosacral region: Secondary | ICD-10-CM | POA: Diagnosis not present

## 2012-10-06 DIAGNOSIS — IMO0001 Reserved for inherently not codable concepts without codable children: Secondary | ICD-10-CM | POA: Diagnosis not present

## 2012-10-06 DIAGNOSIS — Z4789 Encounter for other orthopedic aftercare: Secondary | ICD-10-CM | POA: Diagnosis not present

## 2012-10-10 DIAGNOSIS — I10 Essential (primary) hypertension: Secondary | ICD-10-CM | POA: Diagnosis not present

## 2012-10-10 DIAGNOSIS — E669 Obesity, unspecified: Secondary | ICD-10-CM | POA: Diagnosis not present

## 2012-10-10 DIAGNOSIS — Z4789 Encounter for other orthopedic aftercare: Secondary | ICD-10-CM | POA: Diagnosis not present

## 2012-10-10 DIAGNOSIS — E119 Type 2 diabetes mellitus without complications: Secondary | ICD-10-CM | POA: Diagnosis not present

## 2012-10-10 DIAGNOSIS — IMO0001 Reserved for inherently not codable concepts without codable children: Secondary | ICD-10-CM | POA: Diagnosis not present

## 2012-10-13 DIAGNOSIS — I10 Essential (primary) hypertension: Secondary | ICD-10-CM | POA: Diagnosis not present

## 2012-10-13 DIAGNOSIS — E119 Type 2 diabetes mellitus without complications: Secondary | ICD-10-CM | POA: Diagnosis not present

## 2012-10-13 DIAGNOSIS — Z4789 Encounter for other orthopedic aftercare: Secondary | ICD-10-CM | POA: Diagnosis not present

## 2012-10-13 DIAGNOSIS — E669 Obesity, unspecified: Secondary | ICD-10-CM | POA: Diagnosis not present

## 2012-10-13 DIAGNOSIS — IMO0001 Reserved for inherently not codable concepts without codable children: Secondary | ICD-10-CM | POA: Diagnosis not present

## 2012-10-17 DIAGNOSIS — IMO0001 Reserved for inherently not codable concepts without codable children: Secondary | ICD-10-CM | POA: Diagnosis not present

## 2012-10-17 DIAGNOSIS — E669 Obesity, unspecified: Secondary | ICD-10-CM | POA: Diagnosis not present

## 2012-10-17 DIAGNOSIS — I10 Essential (primary) hypertension: Secondary | ICD-10-CM | POA: Diagnosis not present

## 2012-10-17 DIAGNOSIS — E119 Type 2 diabetes mellitus without complications: Secondary | ICD-10-CM | POA: Diagnosis not present

## 2012-10-17 DIAGNOSIS — Z4789 Encounter for other orthopedic aftercare: Secondary | ICD-10-CM | POA: Diagnosis not present

## 2012-10-20 DIAGNOSIS — E119 Type 2 diabetes mellitus without complications: Secondary | ICD-10-CM | POA: Diagnosis not present

## 2012-10-20 DIAGNOSIS — E669 Obesity, unspecified: Secondary | ICD-10-CM | POA: Diagnosis not present

## 2012-10-20 DIAGNOSIS — IMO0001 Reserved for inherently not codable concepts without codable children: Secondary | ICD-10-CM | POA: Diagnosis not present

## 2012-10-20 DIAGNOSIS — Z4789 Encounter for other orthopedic aftercare: Secondary | ICD-10-CM | POA: Diagnosis not present

## 2012-10-20 DIAGNOSIS — I10 Essential (primary) hypertension: Secondary | ICD-10-CM | POA: Diagnosis not present

## 2012-10-26 DIAGNOSIS — IMO0001 Reserved for inherently not codable concepts without codable children: Secondary | ICD-10-CM | POA: Diagnosis not present

## 2012-10-26 DIAGNOSIS — Z4789 Encounter for other orthopedic aftercare: Secondary | ICD-10-CM | POA: Diagnosis not present

## 2012-10-26 DIAGNOSIS — E669 Obesity, unspecified: Secondary | ICD-10-CM | POA: Diagnosis not present

## 2012-10-26 DIAGNOSIS — E119 Type 2 diabetes mellitus without complications: Secondary | ICD-10-CM | POA: Diagnosis not present

## 2012-10-26 DIAGNOSIS — I10 Essential (primary) hypertension: Secondary | ICD-10-CM | POA: Diagnosis not present

## 2012-11-09 ENCOUNTER — Other Ambulatory Visit: Payer: Self-pay | Admitting: Cardiology

## 2012-11-09 ENCOUNTER — Other Ambulatory Visit: Payer: Self-pay | Admitting: Family Medicine

## 2012-11-09 MED ORDER — LISINOPRIL 40 MG PO TABS: 40.0000 mg | ORAL_TABLET | Freq: Every day | ORAL | Status: DC

## 2012-11-23 ENCOUNTER — Ambulatory Visit: Payer: Medicare Other | Admitting: Cardiology

## 2012-12-12 ENCOUNTER — Other Ambulatory Visit: Payer: Self-pay | Admitting: Family Medicine

## 2012-12-15 ENCOUNTER — Encounter: Payer: Self-pay | Admitting: *Deleted

## 2012-12-19 ENCOUNTER — Telehealth: Payer: Self-pay | Admitting: Family Medicine

## 2012-12-19 DIAGNOSIS — E785 Hyperlipidemia, unspecified: Secondary | ICD-10-CM

## 2012-12-19 DIAGNOSIS — Z79899 Other long term (current) drug therapy: Secondary | ICD-10-CM

## 2012-12-19 DIAGNOSIS — Z125 Encounter for screening for malignant neoplasm of prostate: Secondary | ICD-10-CM

## 2012-12-19 DIAGNOSIS — E119 Type 2 diabetes mellitus without complications: Secondary | ICD-10-CM

## 2012-12-19 NOTE — Telephone Encounter (Signed)
Met 7,lipid,liv,HgA1C,PSA,urine micro,fu OV mandatory

## 2012-12-19 NOTE — Telephone Encounter (Signed)
Pt needs order for BW faxed to [phone]

## 2012-12-19 NOTE — Telephone Encounter (Signed)
Blood work papers printed and faxed. Patient has office visit scheduled 12/23/12.

## 2012-12-20 DIAGNOSIS — E119 Type 2 diabetes mellitus without complications: Secondary | ICD-10-CM | POA: Diagnosis not present

## 2012-12-20 DIAGNOSIS — E785 Hyperlipidemia, unspecified: Secondary | ICD-10-CM | POA: Diagnosis not present

## 2012-12-20 DIAGNOSIS — Z79899 Other long term (current) drug therapy: Secondary | ICD-10-CM | POA: Diagnosis not present

## 2012-12-20 DIAGNOSIS — Z125 Encounter for screening for malignant neoplasm of prostate: Secondary | ICD-10-CM | POA: Diagnosis not present

## 2012-12-20 LAB — HEPATIC FUNCTION PANEL
ALT: 40 U/L (ref 0–53)
Bilirubin, Direct: 0.1 mg/dL (ref 0.0–0.3)
Total Bilirubin: 0.4 mg/dL (ref 0.3–1.2)

## 2012-12-20 LAB — BASIC METABOLIC PANEL
CO2: 27 mEq/L (ref 19–32)
Chloride: 106 mEq/L (ref 96–112)
Glucose, Bld: 115 mg/dL — ABNORMAL HIGH (ref 70–99)
Potassium: 4.7 mEq/L (ref 3.5–5.3)
Sodium: 144 mEq/L (ref 135–145)

## 2012-12-20 LAB — HEMOGLOBIN A1C
Hgb A1c MFr Bld: 7.5 % — ABNORMAL HIGH (ref <5.7)
Mean Plasma Glucose: 169 mg/dL — ABNORMAL HIGH (ref <117)

## 2012-12-20 LAB — LIPID PANEL
LDL Cholesterol: 106 mg/dL — ABNORMAL HIGH (ref 0–99)
Total CHOL/HDL Ratio: 5.8 Ratio
VLDL: 46 mg/dL — ABNORMAL HIGH (ref 0–40)

## 2012-12-21 LAB — MICROALBUMIN, URINE: Microalb, Ur: 4.35 mg/dL — ABNORMAL HIGH (ref 0.00–1.89)

## 2012-12-23 ENCOUNTER — Ambulatory Visit (INDEPENDENT_AMBULATORY_CARE_PROVIDER_SITE_OTHER): Payer: Medicare Other | Admitting: Family Medicine

## 2012-12-23 ENCOUNTER — Encounter: Payer: Self-pay | Admitting: Family Medicine

## 2012-12-23 VITALS — BP 130/70 | HR 80 | Wt 294.0 lb

## 2012-12-23 DIAGNOSIS — Z0000 Encounter for general adult medical examination without abnormal findings: Secondary | ICD-10-CM

## 2012-12-23 DIAGNOSIS — E785 Hyperlipidemia, unspecified: Secondary | ICD-10-CM | POA: Diagnosis not present

## 2012-12-23 DIAGNOSIS — I10 Essential (primary) hypertension: Secondary | ICD-10-CM

## 2012-12-23 DIAGNOSIS — E119 Type 2 diabetes mellitus without complications: Secondary | ICD-10-CM | POA: Diagnosis not present

## 2012-12-23 DIAGNOSIS — I2510 Atherosclerotic heart disease of native coronary artery without angina pectoris: Secondary | ICD-10-CM

## 2012-12-23 MED ORDER — AMLODIPINE BESYLATE 10 MG PO TABS: ORAL_TABLET | ORAL | Status: DC

## 2012-12-23 MED ORDER — LANTUS 100 UNIT/ML ~~LOC~~ SOLN: SUBCUTANEOUS | Status: DC

## 2012-12-23 MED ORDER — METFORMIN HCL 1000 MG PO TABS: ORAL_TABLET | ORAL | Status: DC

## 2012-12-23 MED ORDER — FUROSEMIDE 40 MG PO TABS: 40.0000 mg | ORAL_TABLET | Freq: Every day | ORAL | Status: DC

## 2012-12-23 MED ORDER — PRAVASTATIN SODIUM 40 MG PO TABS: 40.0000 mg | ORAL_TABLET | Freq: Every day | ORAL | Status: DC

## 2012-12-23 MED ORDER — LISINOPRIL 40 MG PO TABS: 40.0000 mg | ORAL_TABLET | Freq: Every day | ORAL | Status: DC

## 2012-12-23 NOTE — Patient Instructions (Addendum)
How to decrease and hopefully stop Neurontin ( Gabapentin ) and Elavil. Both of these meds were for the nerve related pain in the leg but since you have had the surgery you may not need them.  1st. Amytrriptyline - Stop this first.  If no change over 1 week then  2nd- stop Gabapentin in the am and 1 week later stop the other  See you back in Nov   If problems call

## 2012-12-23 NOTE — Progress Notes (Signed)
  Subjective:    Patient ID: Francisco Powell, male    DOB: 08-22-1940, 73 y.o.   MRN: [id number]  Diabetes He has type 2 diabetes mellitus. He does not see a podiatrist.Eye exam is current.  Pt states he does not check his blood sugar because he cannot afford to buy test strips. He also is following up on bloodwork. Patient overall doing better than what he had been. He denies any low spells currently he's only had one or 2 small low spells. Overall he feels he is doing better he is getting over his back surgery the leg pain is gone he still has leg weakness he wonders about his medicines and whether or not he needs to be on all of them. Each medicine was gone over one by one the Percocet that out to take it. Past medical history diabetes heart disease back surgery neuropathic pain of the right leg Family history noncontributory   Review of Systems Denies foot neuropathy   denies cardiac symptoms no chest tightness pressure pain shortness breath no wheezing or difficulty breathing denies rash denies fevers Objective:   Physical Exam Neck no masses Lungs are clear no crackles heart is regular Heart regular no murmurs Abdomen obese Extremities trace edema Foot exam normal       Assessment & Plan:  He gets yearly eye check up Diabetes overall fairly good control. Encouraged more activity watching diet closely exercising. Followup in 3 months time check hemoglobin A1c. Heart disease stable no current symptoms of angina Will gradually reduce the neurontin and elavil, I don't feel this patient needs this medication ongoing hopefully he can tolerate being off of it if so that is to lessen medicines. 25 minutes spent with patient discussing his issues risk factor management. [id number]

## 2012-12-28 ENCOUNTER — Encounter: Payer: Self-pay | Admitting: Cardiology

## 2012-12-28 ENCOUNTER — Ambulatory Visit (INDEPENDENT_AMBULATORY_CARE_PROVIDER_SITE_OTHER): Payer: Medicare Other | Admitting: Cardiology

## 2012-12-28 VITALS — BP 156/83 | HR 71 | Ht 74.0 in | Wt 294.0 lb

## 2012-12-28 DIAGNOSIS — I2510 Atherosclerotic heart disease of native coronary artery without angina pectoris: Secondary | ICD-10-CM | POA: Diagnosis not present

## 2012-12-28 DIAGNOSIS — I4510 Unspecified right bundle-branch block: Secondary | ICD-10-CM

## 2012-12-28 DIAGNOSIS — E669 Obesity, unspecified: Secondary | ICD-10-CM | POA: Diagnosis not present

## 2012-12-28 DIAGNOSIS — F17200 Nicotine dependence, unspecified, uncomplicated: Secondary | ICD-10-CM | POA: Diagnosis not present

## 2012-12-28 DIAGNOSIS — E785 Hyperlipidemia, unspecified: Secondary | ICD-10-CM

## 2012-12-28 NOTE — Progress Notes (Signed)
HPI [name] comes in today for evaluation and management of his nonobstructive coronary disease. He is having no angina or ischemic symptoms. His blood sugar control is been better recently and his lipids have been at goal with primary care. He had back surgery for unrelenting pain by Dr. Channing Mutters. He still has to go through rehabilitation brace and a limp.  Past Medical History  Diagnosis Date  . Coronary artery disease      a.  LHC 3/10: EF 65%, pLAD 40%, dLAD 60-65%, oD2 80%, pOM1 20%, mRCA 60%; b. Myoview 3/13: Inferolateral defect consistent with soft tissue attenuation versus scar, no definite ischemia, EF 42%;  c.  Echocardiogram 08/2011: Mild LVH, EF 60-65%, normal Jamil Castillo motion, and grade 1 diastolic dysfunction, aortic root dimension 40 mm, aortic root mildly dilated, trivial MR, mild LAE, mild TR  . Hyperlipidemia   . Hypertension   . Diabetes mellitus   . Obesity   . Lumbar back pain     spondylosis and spondylolisthesis   . Renal lithiasis   . Asbestosis(501)     lung  . Anemia, iron deficiency   . Hx of adenomatous colonic polyps   . Sciatica of right side   . Kidney stone     Current Outpatient Prescriptions  Medication Sig Dispense Refill  . amLODipine (NORVASC) 10 MG tablet TAKE ONE TABLET BY MOUTH ONCE DAILY.  90 tablet  2  . aspirin 81 MG tablet Take 81 mg by mouth daily.        Marland Kitchen atenolol (TENORMIN) 25 MG tablet TAKE ONE TABLET BY MOUTH 2 TIMES A DAY FOR BLOOD PRESSURE.  60 tablet  5  . furosemide (LASIX) 40 MG tablet Take 1 tablet (40 mg total) by mouth daily.  90 tablet  3  . Insulin Aspart (NOVOLOG FLEXPEN [place]) Inject into the skin 3 (three) times daily. 03/30/23      . LANTUS 100 UNIT/ML injection INJECT 200 UP TO 240 UNITS SUBCUTANEOUSLY DAILY.  60 mL  3  . lisinopril (PRINIVIL,ZESTRIL) 40 MG tablet Take 1 tablet (40 mg total) by mouth daily.  90 tablet  3  . metFORMIN (GLUCOPHAGE) 1000 MG tablet TAKE 1 TABLET BY MOUTH TWICE DAILY.  180 tablet  2  . pravastatin  (PRAVACHOL) 40 MG tablet Take 1 tablet (40 mg total) by mouth daily.  90 tablet  2   No current facility-administered medications for this visit.    Allergies  Allergen Reactions  . Penicillins Hives, Itching and Swelling  . Atorvastatin   . Other     Bee stings    Family History  Problem Relation Age of Onset  . Colon cancer Mother   . Breast cancer Sister   . Cancer Maternal Grandmother     gastric cancer  . Heart attack Father     History   Social History  . Marital Status: Married    Spouse Name: N/A    Number of Children: N/A  . Years of Education: N/A   Occupational History  . Not on file.   Social History Main Topics  . Smoking status: Never Smoker   . Smokeless tobacco: Current User    Types: Chew  . Alcohol Use: No  . Drug Use: No  . Sexually Active: No   Other Topics Concern  . Not on file   Social History Narrative  . No narrative on file    ROS ALL NEGATIVE EXCEPT THOSE NOTED IN HPI  PE  General Appearance:  well developed, well nourished in no acute distress, muscular, obese HEENT: symmetrical face, PERRLA, good dentition  Neck: no JVD, thyromegaly, or adenopathy, trachea midline Chest: symmetric without deformity Cardiac: PMI non-displaced, RRR, normal S1, S2, no gallop or murmur Lung: clear to ausculation and percussion Vascular: all pulses full without bruits  Abdominal: nondistended, nontender, good bowel sounds, no HSM, no bruits Extremities: no cyanosis, clubbing, 1+ pitting edema, no sign of DVT, no varicosities  Skin: normal color, no rashes Neuro: alert and oriented x 3, non-focal Pysch: normal affect  EKG  BMET    Component Value Date/Time   NA 144 12/20/2012 0715   K 4.7 12/20/2012 0715   CL 106 12/20/2012 0715   CO2 27 12/20/2012 0715   GLUCOSE 115* 12/20/2012 0715   BUN 21 12/20/2012 0715   CREATININE 1.06 12/20/2012 0715   CREATININE 0.90 07/21/2012 1057   CALCIUM 9.6 12/20/2012 0715   GFRNONAA >60 06/26/2009 1100   GFRAA  Value:  >60        The eGFR has been calculated using the MDRD equation. This calculation has not been validated in all clinical situations. eGFR's persistently <60 mL/min signify possible Chronic Kidney Disease. 06/26/2009 1100    Lipid Panel     Component Value Date/Time   CHOL 184 12/20/2012 0715   TRIG 231* 12/20/2012 0715   HDL 32* 12/20/2012 0715   CHOLHDL 5.8 12/20/2012 0715   VLDL 46* 12/20/2012 0715   LDLCALC 106* 12/20/2012 0715    CBC    Component Value Date/Time   WBC 11.1* 10/28/2008 0023   RBC 4.87 10/28/2008 0023   HGB 12.6* 07/21/2012 1057   HCT 37.0* 07/21/2012 1057   PLT 251 10/28/2008 0023   MCV 80.3 10/28/2008 0023   MCHC 33.4 10/28/2008 0023   RDW 15.8* 10/28/2008 0023   LYMPHSABS 3.2 10/28/2008 0023   MONOABS 1.0 10/28/2008 0023   EOSABS 0.1 10/28/2008 0023   BASOSABS 0.1 10/28/2008 0023

## 2012-12-28 NOTE — Patient Instructions (Addendum)
Your physician recommends that you continue on your current medications as directed. Please refer to the Current Medication list given to you today.  Your physician wants you to follow-up in: 1 year with Dr. Diona Browner in [place].  You will receive a reminder letter in the mail two months in advance. If you don't receive a letter, please call our office to schedule the follow-up appointment.

## 2012-12-28 NOTE — Assessment & Plan Note (Signed)
Asymptomatic. Continue secondary preventative therapy. I strongly advised him to continue to lose weight, good through his rehabilitation for his back and hip to increase mobility, keep his cholesterol as low as possible with a statin, and tightly control his blood sugar. I'll schedule him followup with Dr. Diona Browner in one year in [place]. His daughter Elita Quick is a nurse there and has great respect for Dr. Diona Browner.

## 2012-12-30 DIAGNOSIS — R269 Unspecified abnormalities of gait and mobility: Secondary | ICD-10-CM | POA: Diagnosis not present

## 2012-12-30 DIAGNOSIS — M47817 Spondylosis without myelopathy or radiculopathy, lumbosacral region: Secondary | ICD-10-CM | POA: Diagnosis not present

## 2012-12-30 DIAGNOSIS — IMO0001 Reserved for inherently not codable concepts without codable children: Secondary | ICD-10-CM | POA: Diagnosis not present

## 2013-01-02 DIAGNOSIS — M47817 Spondylosis without myelopathy or radiculopathy, lumbosacral region: Secondary | ICD-10-CM | POA: Diagnosis not present

## 2013-01-02 DIAGNOSIS — IMO0001 Reserved for inherently not codable concepts without codable children: Secondary | ICD-10-CM | POA: Diagnosis not present

## 2013-01-02 DIAGNOSIS — R269 Unspecified abnormalities of gait and mobility: Secondary | ICD-10-CM | POA: Diagnosis not present

## 2013-01-04 DIAGNOSIS — R269 Unspecified abnormalities of gait and mobility: Secondary | ICD-10-CM | POA: Diagnosis not present

## 2013-01-04 DIAGNOSIS — IMO0001 Reserved for inherently not codable concepts without codable children: Secondary | ICD-10-CM | POA: Diagnosis not present

## 2013-01-04 DIAGNOSIS — M47817 Spondylosis without myelopathy or radiculopathy, lumbosacral region: Secondary | ICD-10-CM | POA: Diagnosis not present

## 2013-01-06 DIAGNOSIS — R269 Unspecified abnormalities of gait and mobility: Secondary | ICD-10-CM | POA: Diagnosis not present

## 2013-01-06 DIAGNOSIS — IMO0001 Reserved for inherently not codable concepts without codable children: Secondary | ICD-10-CM | POA: Diagnosis not present

## 2013-01-06 DIAGNOSIS — M47817 Spondylosis without myelopathy or radiculopathy, lumbosacral region: Secondary | ICD-10-CM | POA: Diagnosis not present

## 2013-01-09 DIAGNOSIS — R269 Unspecified abnormalities of gait and mobility: Secondary | ICD-10-CM | POA: Diagnosis not present

## 2013-01-09 DIAGNOSIS — IMO0001 Reserved for inherently not codable concepts without codable children: Secondary | ICD-10-CM | POA: Diagnosis not present

## 2013-01-09 DIAGNOSIS — M47817 Spondylosis without myelopathy or radiculopathy, lumbosacral region: Secondary | ICD-10-CM | POA: Diagnosis not present

## 2013-01-11 DIAGNOSIS — M47817 Spondylosis without myelopathy or radiculopathy, lumbosacral region: Secondary | ICD-10-CM | POA: Diagnosis not present

## 2013-01-11 DIAGNOSIS — R269 Unspecified abnormalities of gait and mobility: Secondary | ICD-10-CM | POA: Diagnosis not present

## 2013-01-11 DIAGNOSIS — IMO0001 Reserved for inherently not codable concepts without codable children: Secondary | ICD-10-CM | POA: Diagnosis not present

## 2013-01-13 DIAGNOSIS — IMO0001 Reserved for inherently not codable concepts without codable children: Secondary | ICD-10-CM | POA: Diagnosis not present

## 2013-01-13 DIAGNOSIS — M47817 Spondylosis without myelopathy or radiculopathy, lumbosacral region: Secondary | ICD-10-CM | POA: Diagnosis not present

## 2013-01-26 DIAGNOSIS — M47817 Spondylosis without myelopathy or radiculopathy, lumbosacral region: Secondary | ICD-10-CM | POA: Diagnosis not present

## 2013-01-26 DIAGNOSIS — M4310 Spondylolisthesis, site unspecified: Secondary | ICD-10-CM | POA: Diagnosis not present

## 2013-01-26 DIAGNOSIS — M7130 Other bursal cyst, unspecified site: Secondary | ICD-10-CM | POA: Diagnosis not present

## 2013-02-14 DIAGNOSIS — M47817 Spondylosis without myelopathy or radiculopathy, lumbosacral region: Secondary | ICD-10-CM | POA: Diagnosis not present

## 2013-02-14 DIAGNOSIS — IMO0001 Reserved for inherently not codable concepts without codable children: Secondary | ICD-10-CM | POA: Diagnosis not present

## 2013-02-16 DIAGNOSIS — IMO0001 Reserved for inherently not codable concepts without codable children: Secondary | ICD-10-CM | POA: Diagnosis not present

## 2013-02-16 DIAGNOSIS — M47817 Spondylosis without myelopathy or radiculopathy, lumbosacral region: Secondary | ICD-10-CM | POA: Diagnosis not present

## 2013-02-22 DIAGNOSIS — H52229 Regular astigmatism, unspecified eye: Secondary | ICD-10-CM | POA: Diagnosis not present

## 2013-02-22 DIAGNOSIS — H5200 Hypermetropia, unspecified eye: Secondary | ICD-10-CM | POA: Diagnosis not present

## 2013-02-22 DIAGNOSIS — H31009 Unspecified chorioretinal scars, unspecified eye: Secondary | ICD-10-CM | POA: Diagnosis not present

## 2013-02-22 DIAGNOSIS — H524 Presbyopia: Secondary | ICD-10-CM | POA: Diagnosis not present

## 2013-03-03 ENCOUNTER — Other Ambulatory Visit: Payer: Self-pay

## 2013-03-03 ENCOUNTER — Ambulatory Visit (INDEPENDENT_AMBULATORY_CARE_PROVIDER_SITE_OTHER): Payer: Medicare Other | Admitting: Family Medicine

## 2013-03-03 ENCOUNTER — Telehealth: Payer: Self-pay | Admitting: Family Medicine

## 2013-03-03 ENCOUNTER — Encounter: Payer: Self-pay | Admitting: Family Medicine

## 2013-03-03 VITALS — BP 152/88 | Ht 74.0 in | Wt 303.0 lb

## 2013-03-03 DIAGNOSIS — E119 Type 2 diabetes mellitus without complications: Secondary | ICD-10-CM

## 2013-03-03 DIAGNOSIS — B029 Zoster without complications: Secondary | ICD-10-CM

## 2013-03-03 DIAGNOSIS — E782 Mixed hyperlipidemia: Secondary | ICD-10-CM

## 2013-03-03 DIAGNOSIS — Z79899 Other long term (current) drug therapy: Secondary | ICD-10-CM

## 2013-03-03 MED ORDER — VALACYCLOVIR HCL 1 G PO TABS: 1000.0000 mg | ORAL_TABLET | Freq: Three times a day (TID) | ORAL | Status: AC

## 2013-03-03 NOTE — Telephone Encounter (Signed)
Patient just had blood work done 12/20/12. Does he need to have it repeated?

## 2013-03-03 NOTE — Telephone Encounter (Signed)
After her October 7 patient may do hemoglobin A1c, lipid profile, liver profile, urine micro-protein

## 2013-03-03 NOTE — Telephone Encounter (Signed)
Would like paperwork faxed to [phone] to have blood work completed.

## 2013-03-03 NOTE — Telephone Encounter (Signed)
Notified wife after October 7 patient may do hemoglobin A1c, lipid profile, liver profile, urine micro-protein. Blood work ordered.

## 2013-03-03 NOTE — Progress Notes (Signed)
  Subjective:    Patient ID: Francisco Powell, male    DOB: 1941-04-16, 72 y.o.   MRN: [id number]  HPI painful sore on back of head. Came up 3 days ago. Has tried triple antibiotic ointment.   He relates severe pain backside of the neck across the ear pain and discomfort hurts with touch she tried triple antibiotic ointment without much success he has trouble with hypertension and diabetes scheduled for regular checkup in October PMH benign  Review of Systems    denies fevers Objective:   Physical Exam On examination the redness is consistent with early shingles across the top of the right ear across the back of the neck lungs clear heart regular       Assessment & Plan:  Shingles-yard he has pain medicine cautioned drowsiness, Valtrex 3 times a day 7 days. Warnings discussed

## 2013-03-13 DIAGNOSIS — M47817 Spondylosis without myelopathy or radiculopathy, lumbosacral region: Secondary | ICD-10-CM | POA: Diagnosis not present

## 2013-03-13 DIAGNOSIS — M7130 Other bursal cyst, unspecified site: Secondary | ICD-10-CM | POA: Diagnosis not present

## 2013-03-13 DIAGNOSIS — M4310 Spondylolisthesis, site unspecified: Secondary | ICD-10-CM | POA: Diagnosis not present

## 2013-03-17 ENCOUNTER — Telehealth: Payer: Self-pay | Admitting: Family Medicine

## 2013-03-17 NOTE — Telephone Encounter (Signed)
Stated that he already has oxycodone left over from back surgery but doesn't take it much and doesn't want more pain meds. They wanted to know if he needed to repeat Valtrex and was informed there was no need to repeat valtrex and to call back and schedule follow up office visit if continued pain.

## 2013-03-17 NOTE — Telephone Encounter (Signed)
No need for further valtrex. What is pt taking for the pain--come and let me know. We can add a narcotic, if persists will need to see dr Lorin Picket for post whingles pain

## 2013-03-17 NOTE — Telephone Encounter (Signed)
Patient was seen 03/03/2013 for Shingles.  States he did start to feel better but today he seems to be worse.  Starting to feel the pain in the same area as on the 19th of September.  Medication was taken as prescribed and completed.  [facility].  If Dr. Lorin Picket is unable to answer this message today would Dr. Brett Canales or Eber Jones be taking a look a this?  Patient would like for someone to call them back if possible.

## 2013-03-22 DIAGNOSIS — Z79899 Other long term (current) drug therapy: Secondary | ICD-10-CM | POA: Diagnosis not present

## 2013-03-22 DIAGNOSIS — E782 Mixed hyperlipidemia: Secondary | ICD-10-CM | POA: Diagnosis not present

## 2013-03-22 DIAGNOSIS — E119 Type 2 diabetes mellitus without complications: Secondary | ICD-10-CM | POA: Diagnosis not present

## 2013-03-22 LAB — HEPATIC FUNCTION PANEL
ALT: 27 U/L (ref 0–53)
Bilirubin, Direct: 0.1 mg/dL (ref 0.0–0.3)
Indirect Bilirubin: 0.2 mg/dL (ref 0.0–0.9)

## 2013-03-22 LAB — HEMOGLOBIN A1C: Hgb A1c MFr Bld: 8.7 % — ABNORMAL HIGH (ref <5.7)

## 2013-03-22 LAB — LIPID PANEL
Cholesterol: 161 mg/dL (ref 0–200)
VLDL: 49 mg/dL — ABNORMAL HIGH (ref 0–40)

## 2013-03-28 ENCOUNTER — Ambulatory Visit (INDEPENDENT_AMBULATORY_CARE_PROVIDER_SITE_OTHER): Payer: Medicare Other | Admitting: Family Medicine

## 2013-03-28 VITALS — BP 150/84 | HR 70 | Wt 303.0 lb

## 2013-03-28 DIAGNOSIS — Z23 Encounter for immunization: Secondary | ICD-10-CM

## 2013-03-28 DIAGNOSIS — I10 Essential (primary) hypertension: Secondary | ICD-10-CM | POA: Diagnosis not present

## 2013-03-28 DIAGNOSIS — M545 Low back pain: Secondary | ICD-10-CM

## 2013-03-28 DIAGNOSIS — E785 Hyperlipidemia, unspecified: Secondary | ICD-10-CM

## 2013-03-28 DIAGNOSIS — E119 Type 2 diabetes mellitus without complications: Secondary | ICD-10-CM

## 2013-03-28 MED ORDER — CANAGLIFLOZIN 100 MG PO TABS: 1.0000 | ORAL_TABLET | Freq: Every morning | ORAL | Status: DC

## 2013-03-28 NOTE — Patient Instructions (Addendum)
Your A1C is too high at 8.7. Watch your diet and stay active. We need to see your A1C near 7.0.   Today we will add Invokana this will help lower your sugar.   Please check your sugars more often, write them on a sheet of paper and return them to me. This will help [place] adjust your insulin.  Let us recheck you in 3 months, if this diabetes worsens we will need to have a endocrinologist see you ( diabetic specialist)  Keep track of pain med use for shingles, if becoming frequent consider a daily medication for neuropathy due to shingles   Diabetes Meal Planning Guide The diabetes meal planning guide is a tool to help you plan your meals and snacks. It is important for people with diabetes to manage their blood glucose (sugar) levels. Choosing the right foods and the right amounts throughout your day will help control your blood glucose. Eating right can even help you improve your blood pressure and reach or maintain a healthy weight. CARBOHYDRATE COUNTING MADE EASY When you eat carbohydrates, they turn to sugar. This raises your blood glucose level. Counting carbohydrates can help you control this level so you feel better. When you plan your meals by counting carbohydrates, you can have more flexibility in what you eat and balance your medicine with your food intake. Carbohydrate counting simply means adding up the total amount of carbohydrate grams in your meals and snacks. Try to eat about the same amount at each meal. Foods with carbohydrates are listed below. Each portion below is 1 carbohydrate serving or 15 grams of carbohydrates. Ask your dietician how many grams of carbohydrates you should eat at each meal or snack. Grains and Starches  1 slice bread.   English muffin or hotdog/hamburger bun.   cup cold cereal (unsweetened).   cup cooked pasta or rice.   cup starchy vegetables (corn, potatoes, peas, beans, winter squash).  1 tortilla (6 inches).   bagel.  1 waffle or pancake  (size of a CD).   cup cooked cereal.  4 to 6 small crackers. *Whole grain is recommended. Fruit  1 cup fresh unsweetened berries, melon, papaya, pineapple.  1 small fresh fruit.   banana or mango.   cup fruit juice (4 oz unsweetened).   cup canned fruit in natural juice or water.  2 tbs dried fruit.  12 to 15 grapes or cherries. Milk and Yogurt  1 cup fat-free or 1% milk.  1 cup soy milk.  6 oz light yogurt with sugar-free sweetener.  6 oz low-fat soy yogurt.  6 oz plain yogurt. Vegetables  1 cup raw or  cup cooked is counted as 0 carbohydrates or a "free" food.  If you eat 3 or more servings at 1 meal, count them as 1 carbohydrate serving. Other Carbohydrates   oz chips or pretzels.   cup ice cream or frozen yogurt.   cup sherbet or sorbet.  2 inch square cake, no frosting.  1 tbs honey, sugar, jam, jelly, or syrup.  2 small cookies.  3 squares of graham crackers.  3 cups popcorn.  6 crackers.  1 cup broth-based soup.  Count 1 cup casserole or other mixed foods as 2 carbohydrate servings.  Foods with less than 20 calories in a serving may be counted as 0 carbohydrates or a "free" food. You may want to purchase a book or computer software that lists the carbohydrate gram counts of different foods. In addition, the nutrition facts panel  on the labels of the foods you eat are a good source of this information. The label will tell you how big the serving size is and the total number of carbohydrate grams you will be eating per serving. Divide this number by 15 to obtain the number of carbohydrate servings in a portion. Remember, 1 carbohydrate serving equals 15 grams of carbohydrate. SERVING SIZES Measuring foods and serving sizes helps you make sure you are getting the right amount of food. The list below tells how big or small some common serving sizes are.  1 oz.........4 stacked dice.  3 oz........Marland KitchenDeck of cards.  1 tsp.......Marland KitchenTip of  little finger.  1 tbs......Marland KitchenMarland KitchenThumb.  2 tbs.......Marland KitchenGolf ball.   cup......Marland KitchenHalf of a fist.  1 cup.......Marland KitchenA fist. SAMPLE DIABETES MEAL PLAN Below is a sample meal plan that includes foods from the grain and starches, dairy, vegetable, fruit, and meat groups. A dietician can individualize a meal plan to fit your calorie needs and tell you the number of servings needed from each food group. However, controlling the total amount of carbohydrates in your meal or snack is more important than making sure you include all of the food groups at every meal. You may interchange carbohydrate containing foods (dairy, starches, and fruits). The meal plan below is an example of a 2000 calorie diet using carbohydrate counting. This meal plan has 17 carbohydrate servings. Breakfast  1 cup oatmeal (2 carb servings).   cup light yogurt (1 carb serving).  1 cup blueberries (1 carb serving).   cup almonds. Snack  1 large apple (2 carb servings).  1 low-fat string cheese stick. Lunch  Chicken breast salad.  1 cup spinach.   cup chopped tomatoes.  2 oz chicken breast, sliced.  2 tbs low-fat [place] dressing.  12 whole-wheat crackers (2 carb servings).  12 to 15 grapes (1 carb serving).  1 cup low-fat milk (1 carb serving). Snack  1 cup carrots.   cup hummus (1 carb serving). Dinner  3 oz broiled salmon.  1 cup brown rice (3 carb servings). Snack  1  cups steamed broccoli (1 carb serving) drizzled with 1 tsp olive oil and lemon juice.  1 cup light pudding (2 carb servings). DIABETES MEAL PLANNING WORKSHEET Your dietician can use this worksheet to help you decide how many servings of foods and what types of foods are right for you.  BREAKFAST Food Group and Servings / Carb Servings Grain/Starches __________________________________ Dairy __________________________________________ Vegetable ______________________________________ Fruit  ___________________________________________ Meat __________________________________________ Fat ____________________________________________ LUNCH Food Group and Servings / Carb Servings Grain/Starches ___________________________________ Dairy ___________________________________________ Fruit ____________________________________________ Meat ___________________________________________ Fat _____________________________________________ Laural Golden Food Group and Servings / Carb Servings Grain/Starches ___________________________________ Dairy ___________________________________________ Fruit ____________________________________________ Meat ___________________________________________ Fat _____________________________________________ SNACKS Food Group and Servings / Carb Servings Grain/Starches ___________________________________ Dairy ___________________________________________ Vegetable _______________________________________ Fruit ____________________________________________ Meat ___________________________________________ Fat _____________________________________________ DAILY TOTALS Starches _________________________ Vegetable ________________________ Fruit ____________________________ Dairy ____________________________ Meat ____________________________ Fat ______________________________ Document Released: 02/26/2005 Document Revised: 08/24/2011 Document Reviewed: 01/07/2009 [employer] Patient Information 2014 [employer]

## 2013-03-28 NOTE — Progress Notes (Signed)
Subjective:    Patient ID: Francisco Powell, male    DOB: 18-Jan-1941, 72 y.o.   MRN: [id number]  HPI  Patient here for an diabetic checkup with no concerns This patient was seen today for chronic pain  The medication list was reviewed and updated.  Discussion was held with the patient regarding compliance with pain medication. The patient was advised the importance of maintaining medication and not using illegal substances with these. The patient was educated that we can provide 3 monthly scripts for their medication, it is their responsibility to follow the instructions. Discussion was held with the patient to make sure they're not having significant side effects. Patient is aware that pain medications are meant to minimize the severity of the pain to allow their pain levels to improve to allow for better function. They are aware of that pain medications cannot totally remove their pain. Patient is having some pain due to shingles on the right side of the scalp burning discomfort denies any other particular troubles with it. Denies any other issues with pain medicine uses it maybe 2 or 3 days per week does not miss use it.  The patient was seen today as part of a comprehensive diabetic check up. The patient had the following elements completed: -Review of medication compliance -Review of glucose monitoring results -Review of any complications do to high or low sugars -Diabetic foot exam was completed as part of today's visit. The following was also discussed: -Importance of yearly eye exams -Importance of following diabetic/low sugar-starch diet -Importance of exercise and regular activity -Importance of regular followup visits. -Most recent hemoglobin A1c were reviewed with the patient along with goals regarding diabetes. Patient relates that he is not doing as well with diet he is unable to walk due to back and hip troubles the results of his blood work including elevated hemoglobin  A1c were discussed the importance of getting this down was discussed in detail Review of Systems  Constitutional: Negative for fever, activity change, appetite change and fatigue.  HENT: Negative for rhinorrhea, sinus pressure and sneezing.   Respiratory: Negative for cough and chest tightness.   Cardiovascular: Negative for chest pain.  Gastrointestinal: Negative for abdominal pain.       Objective:   Physical Exam  Vitals reviewed. Constitutional: He is oriented to person, place, and time. He appears well-developed.  HENT:  Head: Normocephalic and atraumatic.  Right Ear: External ear normal.  Left Ear: External ear normal.  Neck: Normal range of motion. Neck supple.  Cardiovascular: Normal rate, regular rhythm and normal heart sounds.   No murmur heard. Pulmonary/Chest: Effort normal and breath sounds normal. No respiratory distress.  Abdominal: Soft. Bowel sounds are normal. He exhibits no distension and no mass.  Musculoskeletal: Normal range of motion. He exhibits no edema.  Lymphadenopathy:    He has no cervical adenopathy.  Neurological: He is alert and oriented to person, place, and time.          Assessment & Plan:  #1 diabetes-subpar control. StartInvokana this should help bring it down. Check hemoglobin A1c again in 3 months. May need to increase the dose of this medicine from 100 mg to 300 mg.  Post shingles neuropathy-discuss the possibility of Neurontin on a daily basis patient defers on this currently  Hypertension pressure recheck actually good at 138/82 continue current medications watch diet closely be as active as possible  Will look into whether or not the patient needs colonoscopy according to my records does  not need it until 2016

## 2013-05-01 ENCOUNTER — Other Ambulatory Visit: Payer: Self-pay | Admitting: Family Medicine

## 2013-05-18 ENCOUNTER — Other Ambulatory Visit: Payer: Self-pay | Admitting: Family Medicine

## 2013-05-31 ENCOUNTER — Telehealth: Payer: Self-pay | Admitting: Family Medicine

## 2013-05-31 NOTE — Telephone Encounter (Signed)
Prior Auth Obtained for LANTUS 100/ml, expires 06/26/2014, for #80 mls per 30 days Faxed approval to [place] and called to notify pt

## 2013-06-21 ENCOUNTER — Encounter: Payer: Self-pay | Admitting: Family Medicine

## 2013-06-21 ENCOUNTER — Ambulatory Visit (INDEPENDENT_AMBULATORY_CARE_PROVIDER_SITE_OTHER): Payer: Medicare Other | Admitting: Family Medicine

## 2013-06-21 ENCOUNTER — Other Ambulatory Visit: Payer: Self-pay | Admitting: Family Medicine

## 2013-06-21 VITALS — BP 150/90 | Temp 100.3°F | Ht 74.0 in | Wt 294.0 lb

## 2013-06-21 DIAGNOSIS — J111 Influenza due to unidentified influenza virus with other respiratory manifestations: Secondary | ICD-10-CM | POA: Diagnosis not present

## 2013-06-21 MED ORDER — OSELTAMIVIR PHOSPHATE 75 MG PO CAPS: 75.0000 mg | ORAL_CAPSULE | Freq: Two times a day (BID) | ORAL | Status: DC

## 2013-06-21 NOTE — Progress Notes (Signed)
   Subjective:    Patient ID: Francisco Powell, male    DOB: Nov 16, 1940, 73 y.o.   MRN: [id number]  Fever  This is a new problem. The current episode started yesterday. The problem occurs constantly. The problem has been unchanged. The maximum temperature noted was 100 to 100.9 F. The temperature was taken using an oral thermometer. Associated symptoms include congestion, coughing, headaches and muscle aches. He has tried acetaminophen (nyquil, mucinex) for the symptoms. The treatment provided no relief.   Bad headache achey  Low gr fever  No energy level  nyquil prn, night itme mucinex dm    Review of Systems  Constitutional: Positive for fever.  HENT: Positive for congestion.   Respiratory: Positive for cough.   Neurological: Positive for headaches.       Objective:   Physical Exam  Alert mild to moderate malaise. 100.3 type. Lungs clear. Intermittent cough during exam. Heart regular rate and rhythm. H&T moderate his congestion.      Assessment & Plan:  Impression flu plan Tamiflu twice a day 5 days. Symptomatic care discussed. Warning signs discussed. WSL

## 2013-06-29 ENCOUNTER — Encounter: Payer: Self-pay | Admitting: Family Medicine

## 2013-06-29 ENCOUNTER — Ambulatory Visit (INDEPENDENT_AMBULATORY_CARE_PROVIDER_SITE_OTHER): Payer: Medicare Other | Admitting: Family Medicine

## 2013-06-29 VITALS — BP 158/84 | Ht 74.0 in | Wt 296.0 lb

## 2013-06-29 DIAGNOSIS — I10 Essential (primary) hypertension: Secondary | ICD-10-CM

## 2013-06-29 DIAGNOSIS — J209 Acute bronchitis, unspecified: Secondary | ICD-10-CM | POA: Diagnosis not present

## 2013-06-29 DIAGNOSIS — E119 Type 2 diabetes mellitus without complications: Secondary | ICD-10-CM

## 2013-06-29 LAB — POCT GLYCOSYLATED HEMOGLOBIN (HGB A1C): HEMOGLOBIN A1C: 7.2

## 2013-06-29 MED ORDER — LEVOFLOXACIN 500 MG PO TABS: 500.0000 mg | ORAL_TABLET | Freq: Every day | ORAL | Status: DC

## 2013-06-29 NOTE — Progress Notes (Signed)
   Subjective:    Patient ID: Francisco Powell, male    DOB: 15-Jun-1941, 73 y.o.   MRN: [id number]  Diabetes He presents for his follow-up diabetic visit. He has type 2 diabetes mellitus. His disease course has been stable. Pertinent negatives for hypoglycemia include no confusion. (Had low spell 58 near supper time) Pertinent negatives for diabetes include no blurred vision, no fatigue, no polydipsia, no polyphagia and no weakness. Pertinent negatives for hypoglycemia complications include no blackouts and no nocturnal hypoglycemia. Diabetic complications include heart disease. Pertinent negatives for diabetic complications include no autonomic neuropathy.   Patient is here today for a 3 month diabetic check up. The patient was seen today as part of a comprehensive diabetic check up. The patient had the following elements completed: -Review of medication compliance -Review of glucose monitoring results -Review of any complications do to high or low sugars -Diabetic foot exam was completed as part of today's visit. The following was also discussed: -Importance of yearly eye exams -Importance of following diabetic/low sugar-starch diet -Importance of exercise and regular activity -Importance of regular followup visits. -Most recent hemoglobin A1c were reviewed with the patient along with goals regarding diabetes.  He states his blood sugars have been elevated due to the flu.   He has no other concerns.    Review of Systems  Constitutional: Negative for activity change, appetite change and fatigue.  Eyes: Negative for blurred vision.  Endocrine: Negative for polydipsia and polyphagia.  Neurological: Negative for weakness.  Psychiatric/Behavioral: Negative for confusion.       Objective:   Physical Exam  Constitutional: He appears well-developed and well-nourished.  HENT:  Head: Normocephalic and atraumatic.  Right Ear: External ear normal.  Left Ear: External ear normal.    Mouth/Throat: Oropharynx is clear and moist.  Neck: No thyromegaly present.  Cardiovascular: Normal rate, regular rhythm and normal heart sounds.   No murmur heard. Pulmonary/Chest: Effort normal and breath sounds normal. No respiratory distress. He has no wheezes.  Abdominal: Soft. Bowel sounds are normal. He exhibits no distension and no mass. There is no tenderness.  Musculoskeletal: He exhibits no edema.  Lymphadenopathy:    He has no cervical adenopathy.  Neurological: He is alert. He exhibits normal muscle tone.  Skin: Skin is warm and dry. No erythema.  Psychiatric: He has a normal mood and affect. His behavior is normal. Judgment normal.          Assessment & Plan:  #1 post flu bronchitis antibiotics prescribed warning signs discussed #2 diabetes overall fairly good control he'll check her sugars periodically send [place] the results. A1c looking much better. Recheck again in 3-4 months #3 hyperlipidemia overall patient doing well no need to check lab work currently #4 blood pressure mildly elevated even on recheck 1 48/84 his daughter will check his blood pressure as an outpatient they will send [place] some readings. I look over them may need to adjust his medication

## 2013-07-06 DIAGNOSIS — M7130 Other bursal cyst, unspecified site: Secondary | ICD-10-CM | POA: Diagnosis not present

## 2013-07-06 DIAGNOSIS — M47817 Spondylosis without myelopathy or radiculopathy, lumbosacral region: Secondary | ICD-10-CM | POA: Diagnosis not present

## 2013-07-06 DIAGNOSIS — M4310 Spondylolisthesis, site unspecified: Secondary | ICD-10-CM | POA: Diagnosis not present

## 2013-08-11 ENCOUNTER — Other Ambulatory Visit: Payer: Self-pay | Admitting: Family Medicine

## 2013-09-04 ENCOUNTER — Other Ambulatory Visit: Payer: Self-pay | Admitting: Family Medicine

## 2013-09-13 DIAGNOSIS — M7130 Other bursal cyst, unspecified site: Secondary | ICD-10-CM | POA: Diagnosis not present

## 2013-09-13 DIAGNOSIS — M47817 Spondylosis without myelopathy or radiculopathy, lumbosacral region: Secondary | ICD-10-CM | POA: Diagnosis not present

## 2013-09-13 DIAGNOSIS — M4310 Spondylolisthesis, site unspecified: Secondary | ICD-10-CM | POA: Diagnosis not present

## 2013-09-15 ENCOUNTER — Telehealth: Payer: Self-pay | Admitting: Family Medicine

## 2013-09-15 NOTE — Telephone Encounter (Signed)
Ok, so patient only checks his blood sugars once every 2 days (on average). He gives himself 25 units of Novolog in the AM and 30 units in the PM. He gives himself 105 units of Lantus twice a day as well, per wife. He has had about 4 episodes of his sugar "crashing" in the pm to where it has been in the 50's. Wife says he will NOT stay away from sweets.

## 2013-09-15 NOTE — Telephone Encounter (Signed)
Francisco Powell's pt: Seen 06/29/13

## 2013-09-15 NOTE — Telephone Encounter (Signed)
Med list confusing here and scott's note doesn't spell out exact current dose of all insulins and when:

## 2013-09-15 NOTE — Telephone Encounter (Signed)
Patient notified and verbalized understanding. 

## 2013-09-15 NOTE — Telephone Encounter (Signed)
Dec eve novolog to 24/knock 6 u off lantus

## 2013-09-15 NOTE — Telephone Encounter (Signed)
Patients wife called today in regards to patients blood sugar. She and her daughter, who is a [employer] scientist, feels that he is taking too much lantus.  Last night he had an episode where he starts sweating after supper time. Patients wife bought him a candy bar and still after getting home and checking it it was 55. He has had this happen a couple of times. Patients wife would like to ask you to check his chart and determine whether he may need to lower his lantus or come in and be checked.  Feels like lantus is too much

## 2013-09-21 DIAGNOSIS — M169 Osteoarthritis of hip, unspecified: Secondary | ICD-10-CM | POA: Diagnosis not present

## 2013-09-21 DIAGNOSIS — Z981 Arthrodesis status: Secondary | ICD-10-CM | POA: Diagnosis not present

## 2013-09-21 DIAGNOSIS — M5137 Other intervertebral disc degeneration, lumbosacral region: Secondary | ICD-10-CM | POA: Diagnosis not present

## 2013-09-21 DIAGNOSIS — M47817 Spondylosis without myelopathy or radiculopathy, lumbosacral region: Secondary | ICD-10-CM | POA: Diagnosis not present

## 2013-09-21 DIAGNOSIS — M1610 Unilateral primary osteoarthritis, unspecified hip: Secondary | ICD-10-CM | POA: Diagnosis not present

## 2013-10-02 DIAGNOSIS — M7130 Other bursal cyst, unspecified site: Secondary | ICD-10-CM | POA: Diagnosis not present

## 2013-10-02 DIAGNOSIS — M47817 Spondylosis without myelopathy or radiculopathy, lumbosacral region: Secondary | ICD-10-CM | POA: Diagnosis not present

## 2013-10-09 ENCOUNTER — Other Ambulatory Visit: Payer: Self-pay | Admitting: Family Medicine

## 2013-10-30 ENCOUNTER — Other Ambulatory Visit: Payer: Self-pay | Admitting: Family Medicine

## 2013-11-01 DIAGNOSIS — M47817 Spondylosis without myelopathy or radiculopathy, lumbosacral region: Secondary | ICD-10-CM | POA: Diagnosis not present

## 2013-11-01 DIAGNOSIS — M7130 Other bursal cyst, unspecified site: Secondary | ICD-10-CM | POA: Diagnosis not present

## 2013-11-01 DIAGNOSIS — M4310 Spondylolisthesis, site unspecified: Secondary | ICD-10-CM | POA: Diagnosis not present

## 2013-11-09 ENCOUNTER — Emergency Department (HOSPITAL_COMMUNITY): Payer: Medicare Other

## 2013-11-09 ENCOUNTER — Encounter (HOSPITAL_COMMUNITY): Payer: Self-pay | Admitting: Emergency Medicine

## 2013-11-09 ENCOUNTER — Encounter (HOSPITAL_COMMUNITY)
Admission: EM | Disposition: A | Discharge status: Home / Self Care | Payer: Medicare Other | Source: Home / Self Care | Attending: Cardiology

## 2013-11-09 ENCOUNTER — Inpatient Hospital Stay (HOSPITAL_COMMUNITY)
Admission: EM | Admit: 2013-11-09 | Discharge: 2013-11-11 | DRG: 247 | Disposition: A | Discharge status: Home / Self Care | Payer: Medicare Other | Attending: Cardiology | Admitting: Cardiology

## 2013-11-09 DIAGNOSIS — I498 Other specified cardiac arrhythmias: Secondary | ICD-10-CM | POA: Diagnosis present

## 2013-11-09 DIAGNOSIS — E785 Hyperlipidemia, unspecified: Secondary | ICD-10-CM | POA: Diagnosis present

## 2013-11-09 DIAGNOSIS — F17200 Nicotine dependence, unspecified, uncomplicated: Secondary | ICD-10-CM

## 2013-11-09 DIAGNOSIS — I452 Bifascicular block: Secondary | ICD-10-CM | POA: Diagnosis present

## 2013-11-09 DIAGNOSIS — Z9861 Coronary angioplasty status: Secondary | ICD-10-CM

## 2013-11-09 DIAGNOSIS — I200 Unstable angina: Secondary | ICD-10-CM | POA: Diagnosis present

## 2013-11-09 DIAGNOSIS — E1165 Type 2 diabetes mellitus with hyperglycemia: Secondary | ICD-10-CM

## 2013-11-09 DIAGNOSIS — Z6835 Body mass index (BMI) 35.0-35.9, adult: Secondary | ICD-10-CM

## 2013-11-09 DIAGNOSIS — Z91038 Other insect allergy status: Secondary | ICD-10-CM | POA: Diagnosis not present

## 2013-11-09 DIAGNOSIS — Z794 Long term (current) use of insulin: Secondary | ICD-10-CM | POA: Diagnosis not present

## 2013-11-09 DIAGNOSIS — Z7982 Long term (current) use of aspirin: Secondary | ICD-10-CM | POA: Diagnosis not present

## 2013-11-09 DIAGNOSIS — Z8249 Family history of ischemic heart disease and other diseases of the circulatory system: Secondary | ICD-10-CM | POA: Diagnosis not present

## 2013-11-09 DIAGNOSIS — R001 Bradycardia, unspecified: Secondary | ICD-10-CM | POA: Diagnosis present

## 2013-11-09 DIAGNOSIS — E119 Type 2 diabetes mellitus without complications: Secondary | ICD-10-CM

## 2013-11-09 DIAGNOSIS — I2510 Atherosclerotic heart disease of native coronary artery without angina pectoris: Secondary | ICD-10-CM | POA: Diagnosis present

## 2013-11-09 DIAGNOSIS — I214 Non-ST elevation (NSTEMI) myocardial infarction: Secondary | ICD-10-CM | POA: Diagnosis not present

## 2013-11-09 DIAGNOSIS — R0602 Shortness of breath: Secondary | ICD-10-CM | POA: Diagnosis not present

## 2013-11-09 DIAGNOSIS — I10 Essential (primary) hypertension: Secondary | ICD-10-CM | POA: Diagnosis not present

## 2013-11-09 DIAGNOSIS — IMO0001 Reserved for inherently not codable concepts without codable children: Secondary | ICD-10-CM | POA: Diagnosis not present

## 2013-11-09 DIAGNOSIS — IMO0002 Reserved for concepts with insufficient information to code with codable children: Secondary | ICD-10-CM | POA: Diagnosis present

## 2013-11-09 DIAGNOSIS — Z803 Family history of malignant neoplasm of breast: Secondary | ICD-10-CM | POA: Diagnosis not present

## 2013-11-09 DIAGNOSIS — Z888 Allergy status to other drugs, medicaments and biological substances status: Secondary | ICD-10-CM | POA: Diagnosis not present

## 2013-11-09 DIAGNOSIS — J61 Pneumoconiosis due to asbestos and other mineral fibers: Secondary | ICD-10-CM | POA: Diagnosis present

## 2013-11-09 DIAGNOSIS — R079 Chest pain, unspecified: Secondary | ICD-10-CM | POA: Diagnosis not present

## 2013-11-09 DIAGNOSIS — Z880 Allergy status to penicillin: Secondary | ICD-10-CM | POA: Diagnosis not present

## 2013-11-09 DIAGNOSIS — Z800 Family history of malignant neoplasm of digestive organs: Secondary | ICD-10-CM

## 2013-11-09 DIAGNOSIS — R0789 Other chest pain: Secondary | ICD-10-CM | POA: Diagnosis not present

## 2013-11-09 DIAGNOSIS — I2584 Coronary atherosclerosis due to calcified coronary lesion: Secondary | ICD-10-CM | POA: Diagnosis present

## 2013-11-09 HISTORY — DX: Type 2 diabetes mellitus without complications: E11.9

## 2013-11-09 HISTORY — DX: Gout, unspecified: M10.9

## 2013-11-09 HISTORY — DX: Acute myocardial infarction, unspecified: I21.9

## 2013-11-09 HISTORY — DX: Malignant neoplasm of unspecified kidney, except renal pelvis: C64.9

## 2013-11-09 HISTORY — DX: Anxiety disorder, unspecified: F41.9

## 2013-11-09 HISTORY — PX: LEFT HEART CATHETERIZATION WITH CORONARY ANGIOGRAM: SHX5451

## 2013-11-09 LAB — CBC WITH DIFFERENTIAL/PLATELET
BASOS ABS: 0 10*3/uL (ref 0.0–0.1)
Basophils Relative: 0 % (ref 0–1)
EOS PCT: 1 % (ref 0–5)
Eosinophils Absolute: 0.1 10*3/uL (ref 0.0–0.7)
HEMATOCRIT: 41.9 % (ref 39.0–52.0)
Hemoglobin: 12.9 g/dL — ABNORMAL LOW (ref 13.0–17.0)
LYMPHS PCT: 23 % (ref 12–46)
Lymphs Abs: 2.1 10*3/uL (ref 0.7–4.0)
MCH: 25.2 pg — ABNORMAL LOW (ref 26.0–34.0)
MCHC: 30.8 g/dL (ref 30.0–36.0)
MCV: 82 fL (ref 78.0–100.0)
MONO ABS: 1 10*3/uL (ref 0.1–1.0)
Monocytes Relative: 11 % (ref 3–12)
Neutro Abs: 6 10*3/uL (ref 1.7–7.7)
Neutrophils Relative %: 65 % (ref 43–77)
Platelets: 240 10*3/uL (ref 150–400)
RBC: 5.11 MIL/uL (ref 4.22–5.81)
RDW: 17.4 % — AB (ref 11.5–15.5)
WBC: 9.2 10*3/uL (ref 4.0–10.5)

## 2013-11-09 LAB — BASIC METABOLIC PANEL
BUN: 14 mg/dL (ref 6–23)
CALCIUM: 9.4 mg/dL (ref 8.4–10.5)
CO2: 25 meq/L (ref 19–32)
Chloride: 102 mEq/L (ref 96–112)
Creatinine, Ser: 0.85 mg/dL (ref 0.50–1.35)
GFR calc Af Amer: >90 mL/min (ref >90)
GFR calc non Af Amer: 86 mL/min — ABNORMAL LOW (ref >90)
GLUCOSE: 198 mg/dL — AB (ref 70–99)
Potassium: 4.5 mEq/L (ref 3.7–5.3)
Sodium: 143 mEq/L (ref 137–147)

## 2013-11-09 LAB — PRO B NATRIURETIC PEPTIDE: Pro B Natriuretic peptide (BNP): 156.9 pg/mL — ABNORMAL HIGH (ref 0–125)

## 2013-11-09 LAB — PROTIME-INR
INR: 1.31 (ref 0.00–1.49)
Prothrombin Time: 16 seconds — ABNORMAL HIGH (ref 11.6–15.2)

## 2013-11-09 LAB — I-STAT TROPONIN, ED: TROPONIN I, POC: 0.23 ng/mL — AB (ref 0.00–0.08)

## 2013-11-09 LAB — TSH: TSH: 3.19 u[IU]/mL (ref 0.350–4.500)

## 2013-11-09 LAB — POCT ACTIVATED CLOTTING TIME: ACTIVATED CLOTTING TIME: 431 s

## 2013-11-09 LAB — MAGNESIUM: MAGNESIUM: 1.8 mg/dL (ref 1.5–2.5)

## 2013-11-09 LAB — TROPONIN I
TROPONIN I: 3.62 ng/mL — AB (ref <0.30)
Troponin I: 0.86 ng/mL (ref <0.30)

## 2013-11-09 LAB — HEMOGLOBIN A1C
Hgb A1c MFr Bld: 7.5 % — ABNORMAL HIGH (ref <5.7)
Mean Plasma Glucose: 169 mg/dL — ABNORMAL HIGH (ref <117)

## 2013-11-09 LAB — GLUCOSE, CAPILLARY: Glucose-Capillary: 99 mg/dL (ref 70–99)

## 2013-11-09 SURGERY — LEFT HEART CATHETERIZATION WITH CORONARY ANGIOGRAM
Anesthesia: LOCAL

## 2013-11-09 MED ORDER — ALPRAZOLAM 0.5 MG PO TABS
0.5000 mg | ORAL_TABLET | Freq: Four times a day (QID) | ORAL | Status: DC | PRN
  Administered 2013-11-09 – 2013-11-10 (×3): 0.5 mg via ORAL
  Filled 2013-11-09 (×3): qty 1

## 2013-11-09 MED ORDER — NITROGLYCERIN 0.2 MG/ML ON CALL CATH LAB
INTRAVENOUS | Status: AC
  Filled 2013-11-09: qty 1

## 2013-11-09 MED ORDER — MIDAZOLAM HCL 2 MG/2ML IJ SOLN
INTRAMUSCULAR | Status: AC
  Filled 2013-11-09: qty 2

## 2013-11-09 MED ORDER — FENTANYL CITRATE 0.05 MG/ML IJ SOLN
INTRAMUSCULAR | Status: AC
  Filled 2013-11-09: qty 2

## 2013-11-09 MED ORDER — ASPIRIN EC 81 MG PO TBEC: 81.0000 mg | DELAYED_RELEASE_TABLET | Freq: Every day | ORAL | Status: DC

## 2013-11-09 MED ORDER — ASPIRIN 81 MG PO CHEW
81.0000 mg | CHEWABLE_TABLET | Freq: Every day | ORAL | Status: DC
  Administered 2013-11-10: 09:00:00 81 mg via ORAL
  Filled 2013-11-09 (×3): qty 1

## 2013-11-09 MED ORDER — SODIUM CHLORIDE 0.9 % IV SOLN: 250.0000 mL | INTRAVENOUS | Status: DC | PRN

## 2013-11-09 MED ORDER — HYDRALAZINE HCL 10 MG PO TABS
10.0000 mg | ORAL_TABLET | Freq: Four times a day (QID) | ORAL | Status: DC | PRN
  Filled 2013-11-09: qty 1

## 2013-11-09 MED ORDER — SODIUM CHLORIDE 0.9 % IJ SOLN: 3.0000 mL | INTRAMUSCULAR | Status: DC | PRN

## 2013-11-09 MED ORDER — SODIUM CHLORIDE 0.9 % IV SOLN
1.0000 mL/kg/h | INTRAVENOUS | Status: DC
  Administered 2013-11-10: 06:00:00 1 mL/kg/h via INTRAVENOUS

## 2013-11-09 MED ORDER — LISINOPRIL 40 MG PO TABS: 40.0000 mg | ORAL_TABLET | Freq: Every day | ORAL | Status: DC

## 2013-11-09 MED ORDER — PRAVASTATIN SODIUM 40 MG PO TABS
40.0000 mg | ORAL_TABLET | Freq: Every day | ORAL | Status: DC
Start: 2013-11-09 — End: 2013-11-11
  Administered 2013-11-09 – 2013-11-10 (×2): 40 mg via ORAL
  Filled 2013-11-09 (×3): qty 1

## 2013-11-09 MED ORDER — LIDOCAINE HCL (PF) 1 % IJ SOLN
INTRAMUSCULAR | Status: AC
  Filled 2013-11-09: qty 30

## 2013-11-09 MED ORDER — TICAGRELOR 90 MG PO TABS
90.0000 mg | ORAL_TABLET | Freq: Two times a day (BID) | ORAL | Status: DC
  Administered 2013-11-10: 90 mg via ORAL
  Filled 2013-11-09 (×2): qty 1

## 2013-11-09 MED ORDER — VERAPAMIL HCL 2.5 MG/ML IV SOLN
INTRAVENOUS | Status: AC
  Filled 2013-11-09: qty 2

## 2013-11-09 MED ORDER — ASPIRIN 81 MG PO CHEW: 81.0000 mg | CHEWABLE_TABLET | Freq: Every day | ORAL | Status: DC

## 2013-11-09 MED ORDER — TICAGRELOR 90 MG PO TABS
ORAL_TABLET | ORAL | Status: AC
  Filled 2013-11-09: qty 1

## 2013-11-09 MED ORDER — SODIUM CHLORIDE 0.9 % IJ SOLN
3.0000 mL | INTRAMUSCULAR | Status: DC | PRN
Start: 2013-11-09 — End: 2013-11-10

## 2013-11-09 MED ORDER — AMLODIPINE BESYLATE 10 MG PO TABS
10.0000 mg | ORAL_TABLET | Freq: Every day | ORAL | Status: DC
  Administered 2013-11-10: 10 mg via ORAL
  Filled 2013-11-09 (×3): qty 1

## 2013-11-09 MED ORDER — TICAGRELOR 90 MG PO TABS
90.0000 mg | ORAL_TABLET | Freq: Two times a day (BID) | ORAL | Status: DC
  Filled 2013-11-09: qty 1

## 2013-11-09 MED ORDER — BIVALIRUDIN 250 MG IV SOLR
INTRAVENOUS | Status: AC
  Filled 2013-11-09: qty 250

## 2013-11-09 MED ORDER — HEPARIN BOLUS VIA INFUSION
4000.0000 [IU] | Freq: Once | INTRAVENOUS | Status: AC
  Administered 2013-11-09: 4000 [IU] via INTRAVENOUS
  Filled 2013-11-09: qty 4000

## 2013-11-09 MED ORDER — LISINOPRIL 40 MG PO TABS
40.0000 mg | ORAL_TABLET | Freq: Every day | ORAL | Status: DC
  Administered 2013-11-09 – 2013-11-10 (×2): 40 mg via ORAL
  Filled 2013-11-09 (×3): qty 1

## 2013-11-09 MED ORDER — INSULIN ASPART 100 UNIT/ML ~~LOC~~ SOLN
0.0000 [IU] | Freq: Three times a day (TID) | SUBCUTANEOUS | Status: DC
Start: 2013-11-10 — End: 2013-11-11
  Administered 2013-11-10 – 2013-11-11 (×4): 4 [IU] via SUBCUTANEOUS

## 2013-11-09 MED ORDER — ASPIRIN 81 MG PO CHEW: 324.0000 mg | CHEWABLE_TABLET | ORAL | Status: DC

## 2013-11-09 MED ORDER — SODIUM CHLORIDE 0.9 % IV SOLN: 1.0000 mL/kg/h | INTRAVENOUS | Status: DC

## 2013-11-09 MED ORDER — SODIUM CHLORIDE 0.9 % IJ SOLN: 3.0000 mL | Freq: Two times a day (BID) | INTRAMUSCULAR | Status: DC

## 2013-11-09 MED ORDER — ATENOLOL 25 MG PO TABS
25.0000 mg | ORAL_TABLET | Freq: Two times a day (BID) | ORAL | Status: DC
  Administered 2013-11-09: 25 mg via ORAL
  Filled 2013-11-09 (×3): qty 1

## 2013-11-09 MED ORDER — NITROGLYCERIN 0.4 MG SL SUBL: 0.4000 mg | SUBLINGUAL_TABLET | SUBLINGUAL | Status: DC | PRN

## 2013-11-09 MED ORDER — HEPARIN (PORCINE) IN NACL 100-0.45 UNIT/ML-% IJ SOLN
1400.0000 [IU]/h | INTRAMUSCULAR | Status: DC
  Administered 2013-11-09: 1400 [IU]/h via INTRAVENOUS
  Filled 2013-11-09 (×2): qty 250

## 2013-11-09 MED ORDER — ASPIRIN 81 MG PO CHEW: 81.0000 mg | CHEWABLE_TABLET | ORAL | Status: DC

## 2013-11-09 MED ORDER — OXYCODONE-ACETAMINOPHEN 5-325 MG PO TABS: 1.0000 | ORAL_TABLET | ORAL | Status: DC | PRN

## 2013-11-09 MED ORDER — TICAGRELOR 90 MG PO TABS
ORAL_TABLET | ORAL | Status: AC
  Administered 2013-11-10: 90 mg via ORAL
  Filled 2013-11-09: qty 1

## 2013-11-09 MED ORDER — ASPIRIN 300 MG RE SUPP: 300.0000 mg | RECTAL | Status: DC

## 2013-11-09 MED ORDER — HEPARIN (PORCINE) IN NACL 2-0.9 UNIT/ML-% IJ SOLN
INTRAMUSCULAR | Status: AC
  Filled 2013-11-09: qty 1000

## 2013-11-09 MED ORDER — SODIUM CHLORIDE 0.9 % IV SOLN
0.2500 mg/kg/h | INTRAVENOUS | Status: AC
  Administered 2013-11-09: 0.25 mg/kg/h via INTRAVENOUS
  Filled 2013-11-09: qty 250

## 2013-11-09 MED ORDER — SIMVASTATIN 20 MG PO TABS: 20.0000 mg | ORAL_TABLET | Freq: Every day | ORAL | Status: DC

## 2013-11-09 MED ORDER — HEPARIN SODIUM (PORCINE) 1000 UNIT/ML IJ SOLN
INTRAMUSCULAR | Status: AC
  Filled 2013-11-09: qty 1

## 2013-11-09 MED ORDER — SODIUM CHLORIDE 0.9 % IV SOLN
INTRAVENOUS | Status: AC
  Administered 2013-11-09: 18:00:00 via INTRAVENOUS

## 2013-11-09 NOTE — ED Notes (Signed)
Report called to [facility]. Pt going to [facility].

## 2013-11-09 NOTE — ED Notes (Signed)
Results of troponin given to [facility], PA-C

## 2013-11-09 NOTE — Interval H&P Note (Signed)
History and Physical Interval Note:  11/09/2013 2:42 PM  Francisco Powell  has presented today for surgery, with the diagnosis of cp  The various methods of treatment have been discussed with the patient and family. After consideration of risks, benefits and other options for treatment, the patient has consented to  Procedure(s): LEFT HEART CATHETERIZATION WITH CORONARY ANGIOGRAM (N/A) as a surgical intervention .  The patient's history has been reviewed, patient examined, no change in status, stable for surgery.  I have reviewed the patient's chart and labs.  Questions were answered to the patient's satisfaction.    Cath Lab Visit (complete for each Cath Lab visit)  Clinical Evaluation Leading to the Procedure:   ACS: yes  Non-ACS:    Anginal Classification: CCS IV  Anti-ischemic medical therapy: Maximal Therapy (2 or more classes of medications)  Non-Invasive Test Results: No non-invasive testing performed  Prior CABG: No previous CABG       Sherren Mocha

## 2013-11-09 NOTE — CV Procedure (Signed)
Cardiac Catheterization Procedure Note  Name: Francisco Powell MRN: [id number] DOB: 03-21-1941  Procedure: Left Heart Cath, Selective Coronary Angiography, LV angiography, PTCA and stenting of the RCA  Indication: NSTEMI  Procedural Details:  The right wrist was prepped, draped, and anesthetized with 1% lidocaine. Using the modified Seldinger technique, a 5/6 French sheath was introduced into the right radial artery. 3 mg of verapamil was administered through the sheath, weight-based unfractionated heparin was administered intravenously. Standard Judkins catheters were used for selective coronary angiography and left ventriculography. Catheter exchanges were performed over an exchange length guidewire.  PROCEDURAL FINDINGS Hemodynamics: AO 172/78 with a mean of 113 LV 172/25   Coronary angiography: Coronary dominance: right  Left mainstem: Widely patent without obstructive disease. There is 20-30% mid left main stenosis. There is mild calcification. The left main divides into the LAD and left circumflex.  Left anterior descending (LAD): The proximal LAD is severely calcified. The vessel has the appearance of stent but there is no history of this. I suspect this is just having calcification. The LAD just after the first septal perforator has a 90% stenosis. The mid LAD is patent. The apical LAD has severe diffuse 90% stenosis as the vessel wraps around the LV apex. The diagonal branches are small and severely diseased. The first diagonal has sequential 90% stenoses. The second diagonal is very small and has 95% stenosis. The third diagonal is also small and has 75% ostial stenosis.  Left circumflex (LCx): The circumflex is moderate to large in caliber. The first obtuse marginal branch has 50% stenosis. The second OM branch is patent with no high-grade obstructive disease.  Right coronary artery (RCA): The RCA has 50% proximal stenosis. The mid vessel has 99% stenosis. The distal vessel  and branches are small and severely diseased with diffuse 80% stenosis.  Left ventriculography: The left ventricle is poorly opacified, but LV function appears grossly normal. I would estimate the LVEF at 55%.   PCI Note:  Following the diagnostic procedure, the decision was made to proceed with PCI. I felt the patient's diffuse distal vessel disease precluded CABG because of very poor targets. I'll elected to proceed with PCI. I plan on treating the right coronary artery and LAD initially. However, the right coronary artery intervention with prolonged because of calcification, tortuosity, and complex anatomy.  The patient was loaded with brilinta 180 mg. Weight-based bivalirudin was given for anticoagulation. Multiple guide catheters were attempted. Once a therapeutic ACT was achieved, a 6 [place] AL-1 guide catheter was inserted.  A cougar coronary guidewire was  Initially attempted, but this would not cross the lesion. It was placed in an RV marginal branch.a whisper wire was then used to cross the lesion.  The lesion was predilated with a 2.5 x 15 mm balloon.  I tried to cross the lesion with a 2.75 x 23 mm Xience Alpine DES but it would not cross despite aggressive efforts. The cougar wire was removed. A guideliner was advanced into the mid-vessel. The stent would not cross even with a guideliner in place. The lesion was then redilated with a 2.5 mm noncompliant balloon taken to high pressure. The lesion was then stented with a 2.75x23 mm Xience DES stent.  The stent was postdilated with a 3.0 mm noncompliant balloon.  There was irregularity and hypodensity at the proximal stent edge suggestive of edge dissection. This was covered in overlapping fashion with a 3.25x15 mm Xience DES. The stent was deployed at 16 atm. The stent  was postdilated with a 3.5 mm noncompliant balloon at high pressure. The guidewire and wire were removed. Final angiography was performed. Following PCI, there was 0% residual  stenosis and TIMI-3 flow. Final angiography confirmed an excellent result. The patient tolerated the procedure well. Because of prolonged procedure time, contrast use, and concern that the LAD could also be a long intervention, I like to stage the LAD intervention. There were no immediate procedural complications. A TR band was used for radial hemostasis. The patient was transferred to the post catheterization recovery area for further monitoring.  PCI Data: Vessel - RCA/Segment - mid Percent Stenosis (pre)  99 TIMI-flow 3 Stent 2.75 x 23 mm Xience DES and overlapping 3.25x15 mm Xience DES Percent Stenosis (post) 0 TIMI-flow (post) 3  Final Conclusions:   1. Severe multivessel CAD with diffuse distal CAD and critical RCA/LAD stenosis 2. Successful complex PCI of the RCA 3. Normal LV function   Recommendations:  ASA/Brilinta at least 12 months. Hydrate overnight, anticipate staged PCI of the LAD tomorrow if renal function stable. Plan discussed with patient, family. Reviewed case with Dr Tamala Julian who will perform PCI tomorrow.  Sherren Mocha 11/09/2013, 5:07 PM

## 2013-11-09 NOTE — ED Provider Notes (Signed)
CSN: [id number]     Arrival date & time 11/09/13  1051 History   First MD Initiated Contact with Patient 11/09/13 1057     Chief Complaint  Patient presents with  . Chest Pain     (Consider location/radiation/quality/duration/timing/severity/associated sxs/prior Treatment) Patient is a 73 y.o. male presenting with chest pain. The history is provided by the patient and medical records.  Chest Pain  This is a 73 year old male with past medical history significant for hypertension, diabetes, hyperlipidemia, coronary artery disease, presenting to the ED for chest pain. Patient states this morning he was transferring a trailer from his home [place] part of the family [place] which is approximately 7 miles down the road. States after unhooking the trailer [place] the farm, he began having some left-sided chest pain, described as a pressure. He did have pain radiating into bilateral upper extremities, no radiation into neck. Denies associated shortness of breath, palpitations, diaphoresis, dizziness, lightheadedness, nausea, or vomiting.  Patient states he drove himself back home, took 325 aspirin and 2 old sublingual nitroglycerin but then realized they were expired. EMS was called, patient was given another nitroglycerin with complete resolution of his pain.  Patient has not seen a cardiologist in approximately 2 years, last visit was with Dr. Verl Blalock of [facility] cardiology.    2D echo 09/09/11 with the following conclusions: Left ventricle: The cavity size was at the upper limits of normal. Wall thickness was increased in a pattern of mild LVH. Systolic function was normal. The estimated ejection fraction was in the range of 60% to 65%. Wall motion was normal; there were no regional wall motion abnormalities. Doppler parameters are consistent with abnormal left ventricular relaxation (grade 1 diastolic dysfunction).   Past Medical History  Diagnosis Date  . Coronary artery disease      a.  LHC 3/10: EF  65%, pLAD 40%, dLAD 60-65%, oD2 80%, pOM1 20%, mRCA 60%; b. Myoview 3/13: Inferolateral defect consistent with soft tissue attenuation versus scar, no definite ischemia, EF 42%;  c.  Echocardiogram 08/2011: Mild LVH, EF 60-65%, normal wall motion, and grade 1 diastolic dysfunction, aortic root dimension 40 mm, aortic root mildly dilated, trivial MR, mild LAE, mild TR  . Hyperlipidemia   . Hypertension   . Diabetes mellitus   . Obesity   . Lumbar back pain     spondylosis and spondylolisthesis   . Renal lithiasis   . Asbestosis(501)     lung  . Anemia, iron deficiency   . Hx of adenomatous colonic polyps   . Sciatica of right side   . Kidney stone    Past Surgical History  Procedure Laterality Date  . Holmium laser lithotripsy  2009  . Cholecystectomy      sec biliary dyskinesia  . Eye surgery      secondary to injury detached retina/cataracts  . Back surgery    . Recurrent urolithiasis s/p esl    . Appendectomy    . Tonsillectomy    . Colonoscopy     Family History  Problem Relation Age of Onset  . Colon cancer Mother   . Breast cancer Sister   . Cancer Maternal Grandmother     gastric cancer  . Heart attack Father    History  Substance Use Topics  . Smoking status: Never Smoker   . Smokeless tobacco: Current User    Types: Chew  . Alcohol Use: No    Review of Systems  Cardiovascular: Positive for chest pain.  All other  systems reviewed and are negative.     Allergies  Penicillins; Atorvastatin; and Other  Home Medications   Prior to Admission medications   Medication Sig Start Date End Date Taking? Authorizing Provider  amLODipine (NORVASC) 10 MG tablet Take 10 mg by mouth daily.   Yes Historical Provider, MD  aspirin 81 MG tablet Take 81 mg by mouth daily.     Yes Historical Provider, MD  atenolol (TENORMIN) 25 MG tablet Take 25 mg by mouth 2 (two) times daily.   Yes Historical Provider, MD  Canagliflozin (INVOKANA) 100 MG TABS Take 100 mg by mouth daily.    Yes Historical Provider, MD  insulin aspart (NOVOLOG FLEXPEN) 100 UNIT/ML FlexPen Inject 25 Units into the skin 2 (two) times daily.   Yes Historical Provider, MD  insulin glargine (LANTUS) 100 UNIT/ML injection Inject 60 Units into the skin 2 (two) times daily.   Yes Historical Provider, MD  lisinopril (PRINIVIL,ZESTRIL) 40 MG tablet Take 1 tablet (40 mg total) by mouth daily. 12/23/12  Yes [name], MD  metFORMIN (GLUCOPHAGE) 1000 MG tablet Take 1,000 mg by mouth 2 (two) times daily with a meal.   Yes Historical Provider, MD  pravastatin (PRAVACHOL) 40 MG tablet Take 40 mg by mouth every evening.   Yes Historical Provider, MD   Pulse 49  Resp 20  Ht 6\' 2"  (1.88 m)  Wt 272 lb (123.378 kg)  BMI 34.91 kg/m2  SpO2 96%  Physical Exam  Nursing note and vitals reviewed. Constitutional: He is oriented to person, place, and time. He appears well-developed and well-nourished. No distress.  HENT:  Head: Normocephalic and atraumatic.  Mouth/Throat: Oropharynx is clear and moist.  Eyes: Conjunctivae and EOM are normal. Pupils are equal, round, and reactive to light.  Neck: Normal range of motion. Neck supple.  Cardiovascular: Normal rate, regular rhythm, normal heart sounds, intact distal pulses and normal pulses.   DP pulses intact bilaterally  Pulmonary/Chest: Effort normal and breath sounds normal. No respiratory distress. He has no wheezes.  Abdominal: Soft. Bowel sounds are normal. There is no tenderness. There is no guarding.  Musculoskeletal: Normal range of motion.  Neurological: He is alert and oriented to person, place, and time.  Skin: Skin is warm and dry. He is not diaphoretic.  Psychiatric: He has a normal mood and affect.    ED Course  Procedures (including critical care time)  CRITICAL CARE Performed by: Larene Pickett   Total critical care time: 45  Critical care time was exclusive of separately billable procedures and treating other patients.  Critical care  was necessary to treat or prevent imminent or life-threatening deterioration.  Critical care was time spent personally by me on the following activities: development of treatment plan with patient and/or surrogate as well as nursing, discussions with consultants, evaluation of patient's response to treatment, examination of patient, obtaining history from patient or surrogate, ordering and performing treatments and interventions, ordering and review of laboratory studies, ordering and review of radiographic studies, pulse oximetry and re-evaluation of patient's condition.  Medications  heparin ADULT infusion 100 units/mL (25000 units/250 mL) (1,400 Units/hr Intravenous New Bag/Given 11/09/13 1322)  heparin bolus via infusion 4,000 Units (0 Units Intravenous Stopped 11/09/13 1404)  lidocaine (PF) (XYLOCAINE) 1 % injection (not administered)  heparin 2-0.9 UNIT/ML-% infusion (not administered)  nitroGLYCERIN (NTG ON-CALL) 0.2 mg/mL injection (not administered)  verapamil (ISOPTIN) 2.5 MG/ML injection (not administered)  midazolam (VERSED) 2 MG/2ML injection (not administered)  fentaNYL (SUBLIMAZE) 0.05 MG/ML  injection (not administered)    [place], ED - Abnormal; Notable for the following:    Troponin i, poc 0.23 (*)    All other components within normal limits  CBC WITH DIFFERENTIAL  BASIC METABOLIC PANEL  TROPONIN I  PRO B NATRIURETIC PEPTIDE    Imaging Review No results found.   EKG Interpretation   Date/Time:  Thursday Nov 09 2013 10:53:33 EDT Ventricular Rate:  50 PR Interval:  193 QRS Duration: 155 QT Interval:  474 QTC Calculation: 432 R Axis:   -48 Text Interpretation:  Sinus rhythm Atrial premature complexes Probable  left atrial enlargement RBBB and LAFB Inferior infarct, old Baseline  wander in lead(s) II III aVF Confirmed by BEATON  MD, ROBERT ([id number]) on  11/09/2013 11:44:46 AM      MDM   Final diagnoses:  NSTEMI (non-ST  elevated myocardial infarction)  CAD (coronary artery disease)  HTN (hypertension)  DM (diabetes mellitus)   72 year old male with known coronary artery disease presenting to the ED following an episode of chest pain while transferring a trailer. He denies associated shortness of breath, palpitations, dizziness, diaphoresis, nausea, or vomiting. Patient has already had aspirin and nitroglycerin and is currently pain free. On exam, patient appears well and is in NAD.  Will initiate work-up with EKG, CBC, BMP, trop, CXR.  EKG with ST depression in lateral leads, worsening from old in March 2014.  Troponin positive at 0.23. Remainder of labs reassuring.  Pt started on heparin.  Consulted with cardiology, who will admit for further management.  Larene Pickett, PA-C 11/09/13 1456

## 2013-11-09 NOTE — H&P (Signed)
Patient ID: DAESHAWN REDMANN MRN: [id number], DOB/AGE: 1940/12/02   Admit date: 11/09/2013   Primary Physician: Sallee Lange, MD Primary Cardiologist: Former Dr Verl Blalock patient; transferred to Dr. Domenic Polite in one year in [place] (has not seen yet)   Pt. Profile: Francisco Powell is a 73 y.o. male with a history of nonobstructive CAD, morbid obesity, spondylosis/spondylolisthesis s/p multiple back surgeries, DM2, HTN, and HL who presented to the ED via EMS today with chest pain.   Patient states this morning he was transferring a trailer from his home to another part of the family farm which is approximately 7 miles down the road. States after unhooking the trailer at the farm, he began having some left-sided chest pain, described as a pressure. He did have pain radiating into bilateral upper extremities, no radiation into neck. Denies associated shortness of breath, palpitations, diaphoresis, dizziness, lightheadedness, nausea, or vomiting. He denies exertional chest pain as he is not very mobile due to his back. Patient states he drove himself back home, took 325 aspirin and 2 old sublingual nitroglycerin but then realized they were expired. His daughter who is a nurse took his BP and it was 240/140. He is compliant with all of his medications. EMS was called, patient was given another nitroglycerin with complete resolution of his pain. Patient was last evaluated by Dr. Verl Blalock in 12/28/13 and was doing well at that time.    LHC 08/2008: EF 65%, pLAD 40%, dLAD 60-65%, oD2 80%, pOM1 20%, mRCA 60%.   Myoview 08/2011: Inferolateral defect consistent with soft tissue attenuation versus scar, no definite ischemia, EF 42%.   Echocardiogram 08/2011: Mild LVH, EF 60-65%, normal wall motion, and grade 1 diastolic dysfunction, aortic root dimension 40 mm, aortic root mildly dilated, trivial MR, mild LAE, mild TR   Problem List  Past Medical History  Diagnosis Date  . Coronary artery disease      a.   LHC 3/10: EF 65%, pLAD 40%, dLAD 60-65%, oD2 80%, pOM1 20%, mRCA 60%; b. Myoview 3/13: Inferolateral defect consistent with soft tissue attenuation versus scar, no definite ischemia, EF 42%;  c.  Echocardiogram 08/2011: Mild LVH, EF 60-65%, normal wall motion, and grade 1 diastolic dysfunction, aortic root dimension 40 mm, aortic root mildly dilated, trivial MR, mild LAE, mild TR  . Hyperlipidemia   . Hypertension   . Diabetes mellitus   . Obesity   . Lumbar back pain     spondylosis and spondylolisthesis   . Renal lithiasis   . Asbestosis(501)     lung  . Anemia, iron deficiency   . Hx of adenomatous colonic polyps   . Sciatica of right side   . Kidney stone     Past Surgical History  Procedure Laterality Date  . Holmium laser lithotripsy  2009  . Cholecystectomy      sec biliary dyskinesia  . Eye surgery      secondary to injury detached retina/cataracts  . Back surgery    . Recurrent urolithiasis s/p esl    . Appendectomy    . Tonsillectomy    . Colonoscopy       Allergies  Allergies  Allergen Reactions  . Penicillins Hives, Itching and Swelling  . Atorvastatin   . Other     Bee stings     Home Medications  Prior to Admission medications   Medication Sig Start Date End Date Taking? Authorizing Provider  amLODipine (NORVASC) 10 MG tablet Take 10 mg by mouth daily.  Yes Historical Provider, MD  aspirin 81 MG tablet Take 81 mg by mouth daily.     Yes Historical Provider, MD  atenolol (TENORMIN) 25 MG tablet Take 25 mg by mouth 2 (two) times daily.   Yes Historical Provider, MD  Canagliflozin (INVOKANA) 100 MG TABS Take 100 mg by mouth daily.   Yes Historical Provider, MD  insulin aspart (NOVOLOG FLEXPEN) 100 UNIT/ML FlexPen Inject 25 Units into the skin 2 (two) times daily.   Yes Historical Provider, MD  insulin glargine (LANTUS) 100 UNIT/ML injection Inject 60 Units into the skin 2 (two) times daily.   Yes Historical Provider, MD  lisinopril (PRINIVIL,ZESTRIL) 40  MG tablet Take 1 tablet (40 mg total) by mouth daily. 12/23/12  Yes [name], MD  metFORMIN (GLUCOPHAGE) 1000 MG tablet Take 1,000 mg by mouth 2 (two) times daily with a meal.   Yes Historical Provider, MD  pravastatin (PRAVACHOL) 40 MG tablet Take 40 mg by mouth every evening.   Yes Historical Provider, MD    Family History  Family History  Problem Relation Age of Onset  . Colon cancer Mother   . Breast cancer Sister   . Cancer Maternal Grandmother     gastric cancer  . Heart attack Father     Social History  History   Social History  . Marital Status: Married    Spouse Name: N/A    Number of Children: N/A  . Years of Education: N/A   Occupational History  . Not on file.   Social History Main Topics  . Smoking status: Never Smoker   . Smokeless tobacco: Current User    Types: Chew  . Alcohol Use: No  . Drug Use: No  . Sexual Activity: No   Other Topics Concern  . Not on file   Social History Narrative  . No narrative on file     Review of Systems General:  No chills, fever, night sweats or weight changes.  Cardiovascular:  ++ chest pain, No dyspnea on exertion, edema, orthopnea, palpitations, paroxysmal nocturnal dyspnea. Dermatological: No rash, lesions/masses Respiratory: No cough, dyspnea Urologic: No hematuria, dysuria Abdominal:   No nausea, vomiting, diarrhea, bright red blood per rectum, melena, or hematemesis Neurologic:  No visual changes, wkns, changes in mental status. All other systems reviewed and are otherwise negative except as noted above.  Physical Exam  Pulse 49, resp. rate 20, height 6\' 2"  (1.88 m), weight 272 lb (123.378 kg), SpO2 96.00%.  General: Pleasant, NAD. Obese.  Psych: Normal affect. Neuro: Alert and oriented X 3. Moves all extremities spontaneously. HEENT: Normal  Neck: Supple without bruits or JVD. Lungs:  Resp regular and unlabored, CTA. Heart: RRR no s3, s4, or murmurs. Abdomen: Soft, non-tender, non-distended, BS  + x 4.  Extremities: No clubbing, cyanosis or edema. DP/PT/Radials 2+ and equal bilaterally.  Labs   Lab Results  Component Value Date   WBC 9.2 11/09/2013   HGB 12.9* 11/09/2013   HCT 41.9 11/09/2013   MCV 82.0 11/09/2013   PLT 240 11/09/2013    Recent Labs Lab 11/09/13 1131  NA 143  K 4.5  CL 102  CO2 25  BUN 14  CREATININE 0.85  CALCIUM 9.4  GLUCOSE 198*      Radiology/Studies  Dg Chest Port 1 View  11/09/2013   CLINICAL DATA:  Shortness of breath  EXAM: PORTABLE CHEST - 1 VIEW  COMPARISON:  09/21/2012  FINDINGS: Cardiac shadow remains enlarged. The lungs are well aerated bilaterally.  No focal infiltrate or sizable effusion is seen. No acute bony abnormality is noted.  IMPRESSION: No acute abnormality seen.    2D echo 09/09/11 with the following conclusions:  Left ventricle: The cavity size was at the upper limits of normal. Wall thickness was increased in a pattern of mild LVH. Systolic function was normal. The estimated ejection fraction was in the range of 60% to 65%. Wall motion was normal; there were no regional wall motion abnormalities. Doppler parameters are consistent with abnormal left ventricular relaxation (grade 1 diastolic dysfunction).    ECG  Bradycardic -Sinus rhythm Atrial premature complexes Probable left atrial enlargement RBBB and LAFB Inferior infarct, old  ASSESSMENT AND PLAN JAMERION CABELLO is a 73 y.o. male with a history of nonobstructive CAD, morbid obesity, DM2, HTN, and HL who presented to the ED via EMS today with chest pain.   NSTEMI- in the setting of known CAD. Currently pain free after ASA and NTG. EKG with ST depression in lateral leads, worsening from old in 08/2012. -- POC troponin 0.23, trop #1 0.86 -- Cycle Troponin and serial ECGs -- Continue heparin gtt; will plan for cath later this afternoon. He has not eaten since breakfast.  -- Continue ASA, atenolol, pravastatin  HTN urgency-  BP 240/140 at home. Currently SBP in  160s and 170s.  -- Continue amlodipine and lisinopril. Consider adding hydralazine for BPs >190 or NTG gtt  DM- will check Hg A1c. Hold metformin for cath. Continue lisinopril  HLD- continue statin.     Signed, Perry Mount, PA-C 11/09/2013, 12:28 PM  Pager [phone]   The patient was seen, examined and discussed with Lorretta Harp, PA-C and I agree with the above.   A 73 year old male with h/o obesity, diabetes, HLP, HTN, known CAD with cath in 2010 showing [place] 08/2008: EF 65%, pLAD 40%, dLAD 60-65%, oD2 80%, pOM1 20%, mRCA 60%, equivocal stress test in 2013 (inferolateral defect consistent with soft tissue attenuation versus scar, no definite ischemia, EF 42%) who is being admitted after an episode of typical chest pain associated with new ST depressions on the ECG and troponin elevation (NSTEMI). Severely elevated BP on admission now better controlled.  The patient has a significant FH of premature CAD - father died of massive MI at age 36, and 49 out of 8 brothers died of MI.  We will schedule a cath for today. He is NPO, on i Heparin, aspirin, statin and BB.  Dorothy Spark 11/09/2013

## 2013-11-09 NOTE — Progress Notes (Signed)
ANTICOAGULATION CONSULT NOTE - Initial Consult  Pharmacy Consult for Heparin Indication: chest pain/ACS  Allergies  Allergen Reactions  . Penicillins Hives, Itching and Swelling  . Atorvastatin   . Other     Bee stings    Patient Measurements: Height: 6\' 2"  (188 cm) Weight: 272 lb (123.378 kg) IBW/kg (Calculated) : 82.2 Heparin Dosing Weight: 94 lg  Vital Signs: Pulse Rate: 49 (05/28 1058)  Labs:  Recent Labs  11/09/13 1131  HGB 12.9*  HCT 41.9  PLT 240  CREATININE 0.85  TROPONINI 0.86*    Estimated Creatinine Clearance: 112.9 ml/min (by C-G formula based on Cr of 0.85).   Medical History: Past Medical History  Diagnosis Date  . Coronary artery disease      a.  LHC 3/10: EF 65%, pLAD 40%, dLAD 60-65%, oD2 80%, pOM1 20%, mRCA 60%; b. Myoview 3/13: Inferolateral defect consistent with soft tissue attenuation versus scar, no definite ischemia, EF 42%;  c.  Echocardiogram 08/2011: Mild LVH, EF 60-65%, normal wall motion, and grade 1 diastolic dysfunction, aortic root dimension 40 mm, aortic root mildly dilated, trivial MR, mild LAE, mild TR  . Hyperlipidemia   . Hypertension   . Diabetes mellitus   . Obesity   . Lumbar back pain     spondylosis and spondylolisthesis   . Renal lithiasis   . Asbestosis(501)     lung  . Anemia, iron deficiency   . Hx of adenomatous colonic polyps   . Sciatica of right side   . Kidney stone     Assessment: 73 year old male admitted with chest pain.  Pharmacy asked to begin IV heparin.  Renal function stable  Goal of Therapy:  Heparin level 0.3-0.7 units/ml Monitor platelets by anticoagulation protocol: Yes   Plan:  1) Heparin bolus 4000 units iv x 1 2) Heparin drip at 1400 units / hr 3) Heparin level 6 hours after heparin starts 4) Daily heparin level, CBC  Thank you. Anette Guarneri, PharmD [phone]  Tad Moore 11/09/2013,12:30 PM

## 2013-11-10 ENCOUNTER — Encounter (HOSPITAL_COMMUNITY): Payer: Self-pay | Admitting: Physician Assistant

## 2013-11-10 ENCOUNTER — Encounter (HOSPITAL_COMMUNITY)
Admission: EM | Disposition: A | Discharge status: Home / Self Care | Payer: Medicare Other | Source: Home / Self Care | Attending: Cardiology

## 2013-11-10 DIAGNOSIS — IMO0002 Reserved for concepts with insufficient information to code with codable children: Secondary | ICD-10-CM | POA: Diagnosis present

## 2013-11-10 DIAGNOSIS — I452 Bifascicular block: Secondary | ICD-10-CM | POA: Diagnosis present

## 2013-11-10 DIAGNOSIS — I2510 Atherosclerotic heart disease of native coronary artery without angina pectoris: Secondary | ICD-10-CM

## 2013-11-10 DIAGNOSIS — E1165 Type 2 diabetes mellitus with hyperglycemia: Secondary | ICD-10-CM | POA: Diagnosis present

## 2013-11-10 DIAGNOSIS — I214 Non-ST elevation (NSTEMI) myocardial infarction: Secondary | ICD-10-CM | POA: Diagnosis not present

## 2013-11-10 DIAGNOSIS — I200 Unstable angina: Secondary | ICD-10-CM | POA: Diagnosis present

## 2013-11-10 DIAGNOSIS — R001 Bradycardia, unspecified: Secondary | ICD-10-CM | POA: Diagnosis present

## 2013-11-10 DIAGNOSIS — Z9861 Coronary angioplasty status: Secondary | ICD-10-CM

## 2013-11-10 DIAGNOSIS — I10 Essential (primary) hypertension: Secondary | ICD-10-CM | POA: Diagnosis present

## 2013-11-10 HISTORY — PX: PERCUTANEOUS CORONARY STENT INTERVENTION (PCI-S): SHX5485

## 2013-11-10 LAB — COMPREHENSIVE METABOLIC PANEL
ALBUMIN: 3.7 g/dL (ref 3.5–5.2)
ALK PHOS: 58 U/L (ref 39–117)
ALT: 24 U/L (ref 0–53)
AST: 46 U/L — ABNORMAL HIGH (ref 0–37)
BUN: 11 mg/dL (ref 6–23)
CO2: 25 mEq/L (ref 19–32)
Calcium: 9.7 mg/dL (ref 8.4–10.5)
Chloride: 104 mEq/L (ref 96–112)
Creatinine, Ser: 0.74 mg/dL (ref 0.50–1.35)
GFR calc non Af Amer: >90 mL/min (ref >90)
GLUCOSE: 171 mg/dL — AB (ref 70–99)
POTASSIUM: 4.1 meq/L (ref 3.7–5.3)
SODIUM: 142 meq/L (ref 137–147)
Total Bilirubin: 0.5 mg/dL (ref 0.3–1.2)
Total Protein: 7.1 g/dL (ref 6.0–8.3)

## 2013-11-10 LAB — CBC
HCT: 42.8 % (ref 39.0–52.0)
Hemoglobin: 12.9 g/dL — ABNORMAL LOW (ref 13.0–17.0)
MCH: 24.5 pg — AB (ref 26.0–34.0)
MCHC: 30.1 g/dL (ref 30.0–36.0)
MCV: 81.4 fL (ref 78.0–100.0)
Platelets: 235 10*3/uL (ref 150–400)
RBC: 5.26 MIL/uL (ref 4.22–5.81)
RDW: 17.8 % — AB (ref 11.5–15.5)
WBC: 10.2 10*3/uL (ref 4.0–10.5)

## 2013-11-10 LAB — LIPID PANEL
CHOLESTEROL: 155 mg/dL (ref 0–200)
HDL: 30 mg/dL — AB (ref >39)
LDL Cholesterol: 68 mg/dL (ref 0–99)
Total CHOL/HDL Ratio: 5.2 RATIO
Triglycerides: 285 mg/dL — ABNORMAL HIGH (ref <150)
VLDL: 57 mg/dL — AB (ref 0–40)

## 2013-11-10 LAB — GLUCOSE, CAPILLARY
Glucose-Capillary: 135 mg/dL — ABNORMAL HIGH (ref 70–99)
Glucose-Capillary: 172 mg/dL — ABNORMAL HIGH (ref 70–99)
Glucose-Capillary: 163 mg/dL — ABNORMAL HIGH (ref 70–99)
Glucose-Capillary: 163 mg/dL — ABNORMAL HIGH (ref 70–99)
Glucose-Capillary: 175 mg/dL — ABNORMAL HIGH (ref 70–99)

## 2013-11-10 LAB — TROPONIN I
Troponin I: 3.07 ng/mL (ref <0.30)
Troponin I: 3.37 ng/mL (ref <0.30)

## 2013-11-10 LAB — POCT ACTIVATED CLOTTING TIME: Activated Clotting Time: 332 seconds

## 2013-11-10 SURGERY — PERCUTANEOUS CORONARY STENT INTERVENTION (PCI-S)
Anesthesia: LOCAL

## 2013-11-10 MED ORDER — CANAGLIFLOZIN 100 MG PO TABS
100.0000 mg | ORAL_TABLET | Freq: Every day | ORAL | Status: DC
  Filled 2013-11-10: qty 1

## 2013-11-10 MED ORDER — NITROGLYCERIN 0.2 MG/ML ON CALL CATH LAB
INTRAVENOUS | Status: AC
  Filled 2013-11-10: qty 1

## 2013-11-10 MED ORDER — SODIUM CHLORIDE 0.9 % IV SOLN
1.7500 mg/kg/h | INTRAVENOUS | Status: DC
  Filled 2013-11-10: qty 250

## 2013-11-10 MED ORDER — INSULIN GLARGINE 100 UNIT/ML ~~LOC~~ SOLN
60.0000 [IU] | Freq: Two times a day (BID) | SUBCUTANEOUS | Status: DC
  Administered 2013-11-10: 60 [IU] via SUBCUTANEOUS
  Filled 2013-11-10 (×3): qty 0.6

## 2013-11-10 MED ORDER — BIVALIRUDIN 250 MG IV SOLR
INTRAVENOUS | Status: AC
  Filled 2013-11-10: qty 250

## 2013-11-10 MED ORDER — CARVEDILOL 6.25 MG PO TABS
6.2500 mg | ORAL_TABLET | Freq: Two times a day (BID) | ORAL | Status: DC
  Filled 2013-11-10 (×3): qty 1

## 2013-11-10 MED ORDER — HEPARIN (PORCINE) IN NACL 2-0.9 UNIT/ML-% IJ SOLN
INTRAMUSCULAR | Status: AC
  Filled 2013-11-10: qty 1000

## 2013-11-10 MED ORDER — HYDRALAZINE HCL 20 MG/ML IJ SOLN
10.0000 mg | Freq: Four times a day (QID) | INTRAMUSCULAR | Status: DC | PRN
  Administered 2013-11-10 (×3): 10 mg via INTRAVENOUS
  Filled 2013-11-10 (×3): qty 1

## 2013-11-10 MED ORDER — MIDAZOLAM HCL 2 MG/2ML IJ SOLN
INTRAMUSCULAR | Status: AC
Start: 2013-11-10 — End: 2013-11-10
  Filled 2013-11-10: qty 2

## 2013-11-10 MED ORDER — NITROGLYCERIN IN D5W 200-5 MCG/ML-% IV SOLN
INTRAVENOUS | Status: AC
  Filled 2013-11-10: qty 250

## 2013-11-10 MED ORDER — MIDAZOLAM HCL 2 MG/2ML IJ SOLN
INTRAMUSCULAR | Status: AC
  Filled 2013-11-10: qty 2

## 2013-11-10 MED ORDER — NITROGLYCERIN IN D5W 200-5 MCG/ML-% IV SOLN: 10.0000 ug/min | INTRAVENOUS | Status: DC

## 2013-11-10 MED ORDER — LIDOCAINE HCL (PF) 1 % IJ SOLN
INTRAMUSCULAR | Status: AC
  Filled 2013-11-10: qty 30

## 2013-11-10 MED ORDER — CARVEDILOL 6.25 MG PO TABS
6.2500 mg | ORAL_TABLET | Freq: Two times a day (BID) | ORAL | Status: DC
  Administered 2013-11-10 – 2013-11-11 (×2): 6.25 mg via ORAL
  Filled 2013-11-10 (×4): qty 1

## 2013-11-10 MED ORDER — TICAGRELOR 90 MG PO TABS
90.0000 mg | ORAL_TABLET | Freq: Two times a day (BID) | ORAL | Status: DC
  Administered 2013-11-10 – 2013-11-11 (×2): 90 mg via ORAL
  Filled 2013-11-10 (×3): qty 1

## 2013-11-10 MED FILL — Sodium Chloride IV Soln 0.9%: INTRAVENOUS | Qty: 50 | Status: AC

## 2013-11-10 NOTE — ED Provider Notes (Signed)
Medical screening examination/treatment/procedure(s) were conducted as a shared visit with non-physician practitioner(s) and myself.  I personally evaluated the patient during the encounter   .Face to face Exam:  General:  A&Ox3 HEENT:  Atraumatic Resp:  Normal effort Abd:  Nondistended Neuro:No focal deficits    Francisco Lanes, MD 11/10/13 [phone]

## 2013-11-10 NOTE — H&P (View-Only) (Signed)
Patient: Francisco Powell / Admit Date: 11/09/2013 / Date of Encounter: 11/10/2013, 6:50 AM  Subjective  No CP or SOB. Feels weak from not sleeping well overnight.  Objective   Telemetry: sinus bradycardia HR upper 40s-mid 50s awake, 38-50 overnight  Physical Exam: Blood pressure 157/116, pulse 71, temperature 97.5 F (36.4 C), temperature source Oral, resp. rate 17, height 6\' 2"  (1.88 m), weight 274 lb 0.5 oz (124.3 kg), SpO2 90.00%. General: Well developed, well nourished, obese WM in no acute distress. Head: Normocephalic, atraumatic, sclera non-icteric, no xanthomas, nares are without discharge. Neck: Negative for carotid bruits. JVP not elevated. Lungs: Clear bilaterally to auscultation without wheezes, rales, or rhonchi. Breathing is unlabored. Heart: RRR S1 S2 without murmurs, rubs, or gallops.  Abdomen: Soft, non-tender, non-distended with normoactive bowel sounds. No rebound/guarding. Extremities: No clubbing or cyanosis. No edema. Distal pedal pulses are 2+ and equal bilaterally. R radial site without ecchymosis, hematoma. Neuro: Alert and oriented X 3. Moves all extremities spontaneously. Psych:  Responds to questions appropriately with a normal affect.   Intake/Output Summary (Last 24 hours) at 11/10/13 0650 Last data filed at 11/10/13 4696  Gross per 24 hour  Intake    520 ml  Output   1325 ml  Net   -805 ml    Inpatient Medications:  . amLODipine  10 mg Oral Daily  . aspirin  81 mg Oral Daily  . atenolol  25 mg Oral BID  . [START ON 11/11/2013] Canagliflozin  100 mg Oral Daily  . insulin aspart  0-20 Units Subcutaneous TID WC  . insulin glargine  60 Units Subcutaneous BID  . lisinopril  40 mg Oral q1800  . pravastatin  40 mg Oral Daily  . sodium chloride  3 mL Intravenous Q12H  . sodium chloride  3 mL Intravenous Q12H  . sodium chloride  3 mL Intravenous Q12H  . ticagrelor  90 mg Oral BID   Infusions:  . sodium chloride    . sodium chloride 1 mL/kg/hr  (11/10/13 0557)    Labs:  Recent Labs  11/09/13 1131 11/09/13 1909 11/10/13 0505  NA 143  --  142  K 4.5  --  4.1  CL 102  --  104  CO2 25  --  25  GLUCOSE 198*  --  171*  BUN 14  --  11  CREATININE 0.85  --  0.74  CALCIUM 9.4  --  9.7  MG  --  1.8  --     Recent Labs  11/10/13 0505  AST 46*  ALT 24  ALKPHOS 58  BILITOT 0.5  PROT 7.1  ALBUMIN 3.7    Recent Labs  11/09/13 1131 11/10/13 0505  WBC 9.2 10.2  NEUTROABS 6.0  --   HGB 12.9* 12.9*  HCT 41.9 42.8  MCV 82.0 81.4  PLT 240 235    Recent Labs  11/09/13 1131 11/09/13 1909 11/09/13 2345 11/10/13 0505  TROPONINI 0.86* 3.62* 3.37* 3.07*    Recent Labs  11/09/13 1909  HGBA1C 7.5*     Radiology/Studies:  Dg Chest Port 1 View  11/09/2013   CLINICAL DATA:  Shortness of breath  EXAM: PORTABLE CHEST - 1 VIEW  COMPARISON:  09/21/2012  FINDINGS: Cardiac shadow remains enlarged. The lungs are well aerated bilaterally. No focal infiltrate or sizable effusion is seen. No acute bony abnormality is noted.  IMPRESSION: No acute abnormality seen.   Electronically Signed   By: Inez Catalina M.D.   On: 11/09/2013 11:30  Assessment and Plan  1. NSTEMI/CAD - diffuse distal CAD and critical RCA/LAD stenosis, s/p DES to [facility], for staged PCI of LAD today. ASA, statin, BB as HR allows, Brilinta, heparin. CM consult for Brilinta affordability/pharmacy availability. 2. HTN, poorly controlled - continue ACEI, amlodipine. Re: BB -> given sinus bradycardia, will change atenolol to Coreg for hopefully better BP control and less HR effect. Eequivalent dose of Coreg is 12.5mg  BID but given sinus brady overnight, will decrease to 6.25mg  BID with hold parameters. Note that he has bifascicular block so I have written not to start this until tonight in case MD wants to discontinue completely. Would not add diuretic at this time due to impending cath but can consider post-PCI. We can use hydralazine peri-procedurally. His daughter  Investment banker, corporate- formerly at [facility]) states BP at home usually runs 140/70 but that he doesn't check it that often. 3. Sinus bradycardia - see above. 4. Morbid obesity Body mass index is 35.17 kg/(m^2). - anticipate referral to OP cardiac rehab. Consider sleep apnea evaluation given nocturnal bradycardia. 5. Diabetes mellitus, uncontrolled - A1C 7.5 - Metformin is on hold. He is on resistant SSI. Will write to resume Lantus tonight and Invokana tomorrow given NPO status this AM.  6. Hyperlipidemia - continue statin. He had horrible myalgias with lipitor so will stick with Pravastatin. LDL 68.  Signed, Melina Copa PA-C

## 2013-11-10 NOTE — Progress Notes (Signed)
I spoke with Dr. Cooper about this patient. He had PCI of the right coronary is a day. There was a prolonged procedure. The patient has a tight mid LAD beyond the calcified the relatively straight proximal LAD segment. Left main engagement from the right radial was somewhat tenuous. This was due to tortuosity and subclavian. He is a very large gentleman and avoiding femoral access would be optimal. We will attempt to perform a staged PCI from the left radial if unsuccessful with obtaining good guide support we will convert to right femoral. I discussed this with the patient and daughter.  We discussed the risks and benefit of LAD PCI. We discussed the rationale for performing staged PCI. He understands the risk of stroke, death, myocardial infarction, bleeding, emergency coronary bypass grafting, and limb ischemia. He accepts the risks as outlined.  He had a difficult night last night due to inability to get sleep.  When lying flat his back causes significant discomfort. This is another reason to avoid femoral access. 

## 2013-11-10 NOTE — Progress Notes (Signed)
Patient: Francisco Powell / Admit Date: 11/09/2013 / Date of Encounter: 11/10/2013, 6:50 AM  Subjective  No CP or SOB. Feels weak from not sleeping well overnight.  Objective   Telemetry: sinus bradycardia HR upper 40s-mid 50s awake, 38-50 overnight  Physical Exam: Blood pressure 157/116, pulse 71, temperature 97.5 F (36.4 C), temperature source Oral, resp. rate 17, height 6\' 2"  (1.88 m), weight 274 lb 0.5 oz (124.3 kg), SpO2 90.00%. General: Well developed, well nourished, obese WM in no acute distress. Head: Normocephalic, atraumatic, sclera non-icteric, no xanthomas, nares are without discharge. Neck: Negative for carotid bruits. JVP not elevated. Lungs: Clear bilaterally to auscultation without wheezes, rales, or rhonchi. Breathing is unlabored. Heart: RRR S1 S2 without murmurs, rubs, or gallops.  Abdomen: Soft, non-tender, non-distended with normoactive bowel sounds. No rebound/guarding. Extremities: No clubbing or cyanosis. No edema. Distal pedal pulses are 2+ and equal bilaterally. R radial site without ecchymosis, hematoma. Neuro: Alert and oriented X 3. Moves all extremities spontaneously. Psych:  Responds to questions appropriately with a normal affect.   Intake/Output Summary (Last 24 hours) at 11/10/13 0650 Last data filed at 11/10/13 9485  Gross per 24 hour  Intake    520 ml  Output   1325 ml  Net   -805 ml    Inpatient Medications:  . amLODipine  10 mg Oral Daily  . aspirin  81 mg Oral Daily  . atenolol  25 mg Oral BID  . [START ON 11/11/2013] Canagliflozin  100 mg Oral Daily  . insulin aspart  0-20 Units Subcutaneous TID WC  . insulin glargine  60 Units Subcutaneous BID  . lisinopril  40 mg Oral q1800  . pravastatin  40 mg Oral Daily  . sodium chloride  3 mL Intravenous Q12H  . sodium chloride  3 mL Intravenous Q12H  . sodium chloride  3 mL Intravenous Q12H  . ticagrelor  90 mg Oral BID   Infusions:  . sodium chloride    . sodium chloride 1 mL/kg/hr  (11/10/13 0557)    Labs:  Recent Labs  11/09/13 1131 11/09/13 1909 11/10/13 0505  NA 143  --  142  K 4.5  --  4.1  CL 102  --  104  CO2 25  --  25  GLUCOSE 198*  --  171*  BUN 14  --  11  CREATININE 0.85  --  0.74  CALCIUM 9.4  --  9.7  MG  --  1.8  --     Recent Labs  11/10/13 0505  AST 46*  ALT 24  ALKPHOS 58  BILITOT 0.5  PROT 7.1  ALBUMIN 3.7    Recent Labs  11/09/13 1131 11/10/13 0505  WBC 9.2 10.2  NEUTROABS 6.0  --   HGB 12.9* 12.9*  HCT 41.9 42.8  MCV 82.0 81.4  PLT 240 235    Recent Labs  11/09/13 1131 11/09/13 1909 11/09/13 2345 11/10/13 0505  TROPONINI 0.86* 3.62* 3.37* 3.07*    Recent Labs  11/09/13 1909  HGBA1C 7.5*     Radiology/Studies:  Dg Chest Port 1 View  11/09/2013   CLINICAL DATA:  Shortness of breath  EXAM: PORTABLE CHEST - 1 VIEW  COMPARISON:  09/21/2012  FINDINGS: Cardiac shadow remains enlarged. The lungs are well aerated bilaterally. No focal infiltrate or sizable effusion is seen. No acute bony abnormality is noted.  IMPRESSION: No acute abnormality seen.   Electronically Signed   By: Inez Catalina M.D.   On: 11/09/2013 11:30  Assessment and Plan  1. NSTEMI/CAD - diffuse distal CAD and critical RCA/LAD stenosis, s/p DES to [facility], for staged PCI of LAD today. ASA, statin, BB as HR allows, Brilinta, heparin. CM consult for Brilinta affordability/pharmacy availability. 2. HTN, poorly controlled - continue ACEI, amlodipine. Re: BB -> given sinus bradycardia, will change atenolol to Coreg for hopefully better BP control and less HR effect. Eequivalent dose of Coreg is 12.5mg  BID but given sinus brady overnight, will decrease to 6.25mg  BID with hold parameters. Note that he has bifascicular block so I have written not to start this until tonight in case MD wants to discontinue completely. Would not add diuretic at this time due to impending cath but can consider post-PCI. We can use hydralazine peri-procedurally. His daughter  Investment banker, corporate- formerly at [facility]) states BP at home usually runs 140/70 but that he doesn't check it that often. 3. Sinus bradycardia - see above. 4. Morbid obesity Body mass index is 35.17 kg/(m^2). - anticipate referral to OP cardiac rehab. Consider sleep apnea evaluation given nocturnal bradycardia. 5. Diabetes mellitus, uncontrolled - A1C 7.5 - Metformin is on hold. He is on resistant SSI. Will write to resume Lantus tonight and Invokana tomorrow given NPO status this AM.  6. Hyperlipidemia - continue statin. He had horrible myalgias with lipitor so will stick with Pravastatin. LDL 68.  Signed, Melina Copa PA-C

## 2013-11-10 NOTE — Progress Notes (Signed)
Pt c/o no sleep last night. Could not lie down, felt like he couldn't breathe. Discussed with pt and daughter, who is an Therapist, sports, MI, stent, restrictions, Brilinta. Gave diet sheets and has tobacco sheet. Pt falling asleep at times sitting in chair. Will f/u tomorrow for ambulation and more education. Gave Brilinta book. [place] 8:30 AM 11/10/2013

## 2013-11-10 NOTE — Progress Notes (Signed)
Patient's BP remains elevated. Dr Kennith Center notified and received order for PRN Hydralazine. Will administer as ordered and continue to monitor. Francisco Powell

## 2013-11-10 NOTE — Interval H&P Note (Signed)
Cath Lab Visit (complete for each Cath Lab visit)  Clinical Evaluation Leading to the Procedure:   ACS: yes  Non-ACS:    Anginal Classification: CCS III  Anti-ischemic medical therapy: Minimal Therapy (1 class of medications)  Non-Invasive Test Results: No non-invasive testing performed  Prior CABG: No previous CABG      History and Physical Interval Note:  11/10/2013 10:35 AM  Francisco Powell  has presented today for surgery, with the diagnosis of cp  The various methods of treatment have been discussed with the patient and family. After consideration of risks, benefits and other options for treatment, the patient has consented to  Procedure(s): PERCUTANEOUS CORONARY STENT INTERVENTION (PCI-S) (N/A) as a surgical intervention .  The patient's history has been reviewed, patient examined, no change in status, stable for surgery.  I have reviewed the patient's chart and labs.  Questions were answered to the patient's satisfaction.     Belva Crome III

## 2013-11-10 NOTE — CV Procedure (Addendum)
     PERCUTANEOUS CORONARY INTERVENTION   JAYAN RAYMUNDO is a 73 y.o. male  INDICATION: Non-ST elevation myocardial infarction with 95% mid LAD stenosis identified by angiography on 11/09/2013   PROCEDURE: LAD DES. Complicated prolonged procedure requiring Guideline   CONSENT: The risks, benefits, and details of the procedure were explained to the patient. Risks including death, MI, stroke, bleeding, limb ischemia, emergency CABG, renal failure and allergy were described and accepted by the patient.  Informed written consent was obtained prior to proceeding.  PROCEDURE TECHNIQUE:  After Xylocaine anesthesia a 5 French Slender sheath was placed in the left radial artery with a single anterior needle wall stick.   Coronary guiding shots were made using a 4.5 cm XB LAD guide catheter. Antithrombotic therapy, bivalirudin, was begun and determined to be therapeutic by ACT. Antiplatelet therapy,Brilinta, was loaded 24 hours ago.  Use a BMW wire to cross the stenosis in the mid LAD. I then advanced a Pro-water wire into the circumflex is supported the guide catheter. We then performed a 2.5 x 12 mm balloon inflation. This resulted in dissection but patent and brisk TIMI flow. We then attempted to position and place an 18 x 3.5 mm Xience Alpine . We were unable to advance the stent beyond the proximal LAD where there was an eccentric calcified 50% stenosis. We tried a shorter stent. We tried multiple other maneuvers including a side-by-side buddy wire along with the circumflex support wire. We were unable to advance a stent beyond the proximal LAD. I conferred with a colleague, Dr. Burt Knack. We removed the buddy wires and used a Guideliner but were unable to advance the across the proximal LAD stenosis with a partially inflated 2.0 balloon protruding from the tip of the Guideliner. We took a 2.0 balloon distally and inflated the balloon in the site of angioplasty and could be guidewire. The Guideliner was  still not cross the stenosis. We removed the 2.0 balloon and placed a 3.0 x 12 balloon into the region of angioplasty and inflated to 8 atmospheres. This served as a better anchor and allowed [place] to advance the guidewire and into the mid LAD. We removed the balloon and then positioned and deployed and 18 x 3.5 mm drug-eluting stent. The stent was post dilated to 14 atmospheres using a 15 x 3.5 mm [place] balloon. The final result was very acceptable and there was TIMI grade 3  CONTRAST:  Total of 240 cc.  COMPLICATIONS:  None.    ANGIOGRAPHIC RESULTS:   99% mid LAD stenosis beyond a heavily calcified proximal segment reduced to 0% with TIMI grade 3 flow. The procedure was prolonged and complicated due to the inability to deliver a stent beyond the proximal calcified eccentric LAD.   IMPRESSIONS: 1. Successful PCI with drug-eluting stent implantation in the mid LAD with reduction and 99% stenosis to 0% with TIMI grade 3 flow.   2. Prolonged and complicated procedure do to proximal vessel calcification, eccentric plaque, and weak guide catheter support. We will ultimately successful using a Guideliner to provide intraluminal support The case lasted at least an hour longer than anticipated because of the heavily calcified proximal vessel and difficulty with delivering the stent distally.  RECOMMENDATION:  Progression today. Baby aspirin and Brilinta for at least 12 months. Check renal function in a.m. If no complications, the patient will be eligible for discharge.Marland Kitchen    [name], MD 11/10/2013 12:52 PM

## 2013-11-10 NOTE — H&P (View-Only) (Signed)
I spoke with Dr. Burt Knack about this patient. He had PCI of the right coronary is a day. There was a prolonged procedure. The patient has a tight mid LAD beyond the calcified the relatively straight proximal LAD segment. Left main engagement from the right radial was somewhat tenuous. This was due to tortuosity and subclavian. He is a very large gentleman and avoiding femoral access would be optimal. We will attempt to perform a staged PCI from the left radial if unsuccessful with obtaining good guide support we will convert to right femoral. I discussed this with the patient and daughter.  We discussed the risks and benefit of LAD PCI. We discussed the rationale for performing staged PCI. He understands the risk of stroke, death, myocardial infarction, bleeding, emergency coronary bypass grafting, and limb ischemia. He accepts the risks as outlined.  He had a difficult night last night due to inability to get sleep.  When lying flat his back causes significant discomfort. This is another reason to avoid femoral access.

## 2013-11-10 NOTE — Care Management Note (Signed)
    Page 1 of 1   11/10/2013     11:40:13 AM CARE MANAGEMENT NOTE 11/10/2013  Patient:  Francisco Powell, Francisco Powell   Account Number:  [id number]  Date Initiated:  11/10/2013  Documentation initiated by:  [facility]  Subjective/Objective Assessment:   73 y.o. male with a history of nonobstructive CAD, morbid obesity, spondylosis/spondylolisthesis s/p multiple back surgeries, DM2, HTN, and HL who presented to the ED via EMS with chest pain, NSTE  MI. //Home with spouse.     Action/Plan:   Procedure: Left Heart Cath, Selective Coronary Angiography, LV angiography, PTCA and stenting.// Benefits check for Brilinta.   Anticipated DC Date:  11/11/2013   Anticipated DC Plan:  [place]  CM consult  Medication Assistance      Choice offered to / List presented to:             Status of service:  Completed, signed off Medicare Important Message given?  YES (If response is "NO", the following Medicare IM given date fields will be blank) Date Medicare IM given:  11/09/2013 Date Additional Medicare IM given:    Discharge Disposition:    Per UR Regulation:  Reviewed for med. necessity/level of care/duration of stay  If discussed at [place] of Stay Meetings, dates discussed:    Comments:  11/10/13 0900 Leiah Giannotti J. Clydene Laming, RN, BSN, [employer] [phone] Spoke with pt and daughter at bedside regarding benefits check for Brilinta.  Pt has brochure with 30 day free card and refill assistance card intact.  Pt utilizes [facility] in [place] for prescription needs. NCM called pharmacy to confirm availability of medication. Information relayed to pt.  Pt verbalizes importance of filling medication upon discharge.

## 2013-11-11 DIAGNOSIS — I214 Non-ST elevation (NSTEMI) myocardial infarction: Secondary | ICD-10-CM | POA: Diagnosis not present

## 2013-11-11 DIAGNOSIS — I2510 Atherosclerotic heart disease of native coronary artery without angina pectoris: Secondary | ICD-10-CM | POA: Diagnosis present

## 2013-11-11 DIAGNOSIS — I452 Bifascicular block: Secondary | ICD-10-CM | POA: Diagnosis not present

## 2013-11-11 LAB — BASIC METABOLIC PANEL
BUN: 13 mg/dL (ref 6–23)
CALCIUM: 9.2 mg/dL (ref 8.4–10.5)
CO2: 19 meq/L (ref 19–32)
CREATININE: 0.74 mg/dL (ref 0.50–1.35)
Chloride: 104 mEq/L (ref 96–112)
GFR calc Af Amer: >90 mL/min (ref >90)
GFR calc non Af Amer: >90 mL/min (ref >90)
Glucose, Bld: 190 mg/dL — ABNORMAL HIGH (ref 70–99)
Potassium: 4 mEq/L (ref 3.7–5.3)
Sodium: 141 mEq/L (ref 137–147)

## 2013-11-11 LAB — CBC
HEMATOCRIT: 41.7 % (ref 39.0–52.0)
Hemoglobin: 13 g/dL (ref 13.0–17.0)
MCH: 25 pg — AB (ref 26.0–34.0)
MCHC: 31.2 g/dL (ref 30.0–36.0)
MCV: 80.3 fL (ref 78.0–100.0)
Platelets: 241 10*3/uL (ref 150–400)
RBC: 5.19 MIL/uL (ref 4.22–5.81)
RDW: 17.7 % — ABNORMAL HIGH (ref 11.5–15.5)
WBC: 11.4 10*3/uL — AB (ref 4.0–10.5)

## 2013-11-11 LAB — GLUCOSE, CAPILLARY: Glucose-Capillary: 163 mg/dL — ABNORMAL HIGH (ref 70–99)

## 2013-11-11 MED ORDER — TICAGRELOR 90 MG PO TABS: 90.0000 mg | ORAL_TABLET | Freq: Two times a day (BID) | ORAL | Status: DC

## 2013-11-11 MED ORDER — CARVEDILOL 12.5 MG PO TABS: 12.5000 mg | ORAL_TABLET | Freq: Two times a day (BID) | ORAL | Status: DC

## 2013-11-11 MED ORDER — NITROGLYCERIN 0.4 MG SL SUBL: 0.4000 mg | SUBLINGUAL_TABLET | SUBLINGUAL | Status: DC | PRN

## 2013-11-11 MED ORDER — METFORMIN HCL 1000 MG PO TABS: 1000.0000 mg | ORAL_TABLET | Freq: Two times a day (BID) | ORAL | Status: DC

## 2013-11-11 NOTE — Discharge Instructions (Signed)
Call [facility] [phone] if any bleeding, swelling or drainage at cath site.  May shower, no tub baths for 48 hours for groin sticks.   No lifting over 5 pounds for 5 days.  No driving for 3 days.   Heart Healthy diabetic diet.  Do not stop Brilinta it is for your stent  Hold your metformin until June 1st, it may interact with the cath dye before that time.

## 2013-11-11 NOTE — Discharge Summary (Signed)
Physician Discharge Summary       Patient ID: Francisco Powell MRN: [id number] DOB/AGE: 02-03-1941 73 y.o.  Admit date: 11/09/2013 Discharge date: 11/11/2013  Discharge Diagnoses:  Principal Problem:   NSTEMI (non-ST elevated myocardial infarction) Active Problems:   CAD (coronary artery disease) diffuse distal CAD; s/p DES to mRCA- 11/09/13; staged PCI of mLAD with DES 11/11/13   Bifascicular block - RBBB, LAFB   HYPERLIPIDEMIA-MIXED   Chest pain   S/P PTCA (percutaneous transluminal coronary angioplasty) mLAD with DES 11/11/13;  DES to [facility] 11/09/13    Sinus bradycardia   Morbid obesity-may need sleep study has sleep bradycardia   Diabetes mellitus type II, uncontrolled   Essential hypertension   Intermediate coronary syndrome    Discharged Condition: good  Primary Cardiologist:  Dr. Domenic PoliteLinna Hoff  Procedures: 11/09/13 cardiac cath and Successful complex PCI of the RCA Xience DES by Dr. Burt Knack 11/11/13: Successful PCI with drug-eluting stent implantation in the mid LAD with reduction and 99% stenosis to 0% with TIMI grade 3 flow by Dr. Tamala Julian.  Hospital Course: 73 y.o. male with a history of nonobstructive CAD, morbid obesity, spondylosis/spondylolisthesis s/p multiple back surgeries, DM2, HTN, and HL who presented to the ED via EMS 11/09/13 with chest pain.  Patient stated that morning he was transferring a trailer from his home to another part of the family farm which is approximately 7 miles down the road. States after unhooking the trailer at the farm, he began having some left-sided chest pain, described as a pressure. He did have pain radiating into bilateral upper extremities, no radiation into neck. Denies associated shortness of breath, palpitations, diaphoresis, dizziness, lightheadedness, nausea, or vomiting. He denies exertional chest pain as he is not very mobile due to his back. Patient states he drove himself back home, took 325 aspirin and 2 old sublingual  nitroglycerin but then realized they were expired. His daughter who is a nurse took his BP and it was 240/140. He is compliant with all of his medications. EMS was called, patient was given another nitroglycerin with complete resolution of his pain. Patient was last evaluated by Dr. Verl Blalock in 12/28/12 and was doing well at that time.   Hx of: LHC 08/2008: EF 65%, pLAD 40%, dLAD 60-65%, oD2 80%, pOM1 20%, mRCA 60%.  Myoview 08/2011: Inferolateral defect consistent with soft tissue attenuation versus scar, no definite ischemia, EF 42%.  Echocardiogram 08/2011: Mild LVH, EF 60-65%, normal wall motion, and grade 1 diastolic dysfunction, aortic root dimension 40 mm, aortic root mildly dilated, trivial MR, mild LAE, mild TR.  Pt was scheduled for cardiac cath day of admit and placed on IV heparin initial Toponin POC 0.23.  Cardiac cath revealed:  Left anterior descending (LAD): The proximal LAD is severely calcified. The vessel has the appearance of stent but there is no history of this. I suspect this is just having calcification. The LAD just after the first septal perforator has a 90% stenosis. The mid LAD is patent. The apical LAD has severe diffuse 90% stenosis as the vessel wraps around the LV apex. The diagonal branches are small and severely diseased. The first diagonal has sequential 90% stenoses. The second diagonal is very small and has 95% stenosis. The third diagonal is also small and has 75% ostial stenosis.  Left circumflex (LCx): The circumflex is moderate to large in caliber. The first obtuse marginal branch has 50% stenosis. The second OM branch is patent with no high-grade obstructive disease.  Right coronary artery (RCA):  The RCA has 50% proximal stenosis. The mid vessel has 99% stenosis. The distal vessel and branches are small and severely diseased with diffuse 80% stenosis.  Left ventriculography: The left ventricle is poorly opacified, but LV function appears grossly normal.  Estimated the LVEF  at 55%.  1. Severe multivessel CAD with diffuse distal CAD and critical RCA/LAD stenosis 2. Successful complex PCI of the RCA with Xience DES.  Staged procedure planned for the LAD.   11/11/13 with pk troponin of 3.62, pt then underwent Successful PCI with drug-eluting stent implantation in the mid LAD with reduction and 99% stenosis to 0% with TIMI grade 3 flow by Dr. Tamala Julian.  Prolonged and complicated procedure do to proximal vessel calcification, eccentric plaque, and weak guide catheter support. Ultimately successful using a Guideliner to provide intraluminal support.  The case lasted at least an hour longer than anticipated because of the heavily calcified proximal vessel and difficulty with delivering the stent distally.   By the next AM labs were stable and pt was seen and found stable for discharge by Dr. Lovena Le.  He ambulated without complications.  Please note during the hospitalization his HR did drop at night and coreg was decreased at one point, but with HR in the 80s prior to discharge we did increase his coreg back to 12.5 BID.  He may need sleep study as outpt. With his obesity and now bradycardia at HS. Dr. Domenic Polite with decide.  He is on ACE, BB, Statin and insulin.  He was given card for 30 days free of Brilinta. He will follow up with Dr. Domenic Polite.     Consults: None  Significant Diagnostic Studies:  BMET    Component Value Date/Time   NA 141 11/11/2013 0435   K 4.0 11/11/2013 0435   CL 104 11/11/2013 0435   CO2 19 11/11/2013 0435   GLUCOSE 190* 11/11/2013 0435   BUN 13 11/11/2013 0435   CREATININE 0.74 11/11/2013 0435   CREATININE 1.06 12/20/2012 0715   CALCIUM 9.2 11/11/2013 0435   GFRNONAA >90 11/11/2013 0435   GFRAA >90 11/11/2013 0435    CBC    Component Value Date/Time   WBC 11.4* 11/11/2013 0435   RBC 5.19 11/11/2013 0435   HGB 13.0 11/11/2013 0435   HCT 41.7 11/11/2013 0435   PLT 241 11/11/2013 0435   MCV 80.3 11/11/2013 0435   MCH 25.0* 11/11/2013 0435   MCHC 31.2  11/11/2013 0435   RDW 17.7* 11/11/2013 0435   LYMPHSABS 2.1 11/09/2013 1131   MONOABS 1.0 11/09/2013 1131   EOSABS 0.1 11/09/2013 1131   BASOSABS 0.0 11/09/2013 1131   pk troponin 3.62, decreasing at discharge.   T chol 155, TG 285, HDL 30, LDL 68  Pro BNP 156 HgB A1C 7.5 TSH 3.190  PORTABLE CHEST - 1 VIEW COMPARISON: 09/21/2012 FINDINGS: Cardiac shadow remains enlarged. The lungs are well aerated bilaterally. No focal infiltrate or sizable effusion is seen. No acute bony abnormality is noted. IMPRESSION: No acute abnormality seen  EKG Bifascilar block   Discharge Exam: Blood pressure 160/87, pulse 76, temperature 97.7 F (36.5 C), temperature source Oral, resp. rate 18, height 6\' 2"  (1.88 m), weight 274 lb 11.1 oz (124.6 kg), SpO2 98.00%.   Disposition: 01-Home or Self Care     Medication List    STOP taking these medications       atenolol 25 MG tablet  Commonly known as:  TENORMIN      TAKE these medications  amLODipine 10 MG tablet  Commonly known as:  NORVASC  Take 10 mg by mouth daily.     aspirin 81 MG tablet  Take 81 mg by mouth daily.     carvedilol 12.5 MG tablet  Commonly known as:  COREG  Take 1 tablet (12.5 mg total) by mouth 2 (two) times daily with a meal.     insulin glargine 100 UNIT/ML injection  Commonly known as:  LANTUS  Inject 60 Units into the skin 2 (two) times daily.     INVOKANA 100 MG Tabs  Generic drug:  Canagliflozin  Take 100 mg by mouth daily.     lisinopril 40 MG tablet  Commonly known as:  PRINIVIL,ZESTRIL  Take 1 tablet (40 mg total) by mouth daily.     metFORMIN 1000 MG tablet  Commonly known as:  GLUCOPHAGE  Take 1 tablet (1,000 mg total) by mouth 2 (two) times daily with a meal.  Start taking on:  11/13/2013     nitroGLYCERIN 0.4 MG SL tablet  Commonly known as:  NITROSTAT  Place 1 tablet (0.4 mg total) under the tongue every 5 (five) minutes x 3 doses as needed for chest pain.     NOVOLOG FLEXPEN 100 UNIT/ML  FlexPen  Generic drug:  insulin aspart  Inject 25 Units into the skin 2 (two) times daily.     pravastatin 40 MG tablet  Commonly known as:  PRAVACHOL  Take 40 mg by mouth every evening.     ticagrelor 90 MG Tabs tablet  Commonly known as:  BRILINTA  Take 1 tablet (90 mg total) by mouth 2 (two) times daily.       Follow-up Information   Follow up with Rozann Lesches, MD. (our office will call with date and time)    Specialty:  Cardiology   Contact information:   [place] [address] [phone]        Discharge Instructions: Call [place] [phone] if any bleeding, swelling or drainage at cath site.  May shower, no tub baths for 48 hours for groin sticks.   No lifting over 5 pounds for 5 days.  No driving for 3 days.   Heart Healthy diabetic diet.  Do not stop Brilinta it is for your stent  Hold your metformin until June 1st, it may interact with the cath dye before that time.      Signed: Cecilie Kicks Nurse Practitioner-Certified [facility]: [facility] 11/11/2013, 9:33 AM  Time spent on discharge :> 30 minutes.

## 2013-11-11 NOTE — Progress Notes (Signed)
Patient ID: ARMARION GREEK, male   DOB: 06-19-1940, 73 y.o.   MRN: [id number]   Patient Name: Francisco Powell Date of Encounter: 11/11/2013     Active Problems:   HYPERLIPIDEMIA-MIXED   Chest pain   S/P PTCA (percutaneous transluminal coronary angioplasty)   NSTEMI (non-ST elevated myocardial infarction)   Sinus bradycardia   Bifascicular block - RBBB, LAFB   Morbid obesity   Diabetes mellitus type II, uncontrolled   Essential hypertension   Intermediate coronary syndrome    SUBJECTIVE No chest pain or sob. "I want to go home."  CURRENT MEDS . amLODipine  10 mg Oral Daily  . aspirin  81 mg Oral Daily  . Canagliflozin  100 mg Oral Daily  . carvedilol  6.25 mg Oral BID WC  . insulin aspart  0-20 Units Subcutaneous TID WC  . insulin glargine  60 Units Subcutaneous BID  . lisinopril  40 mg Oral q1800  . pravastatin  40 mg Oral Daily  . ticagrelor  90 mg Oral BID    OBJECTIVE  Filed Vitals:   11/10/13 2359 11/11/13 0446 11/11/13 0500 11/11/13 0729  BP: 198/90 186/77  160/87  Pulse: 84 75  76  Temp: 98.3 F (36.8 C) 97.9 F (36.6 C)  97.7 F (36.5 C)  TempSrc: Oral Oral  Oral  Resp: 18 18  18   Height:      Weight: 274 lb 11.1 oz (124.6 kg)  274 lb 11.1 oz (124.6 kg)   SpO2: 95% 96%  98%    Intake/Output Summary (Last 24 hours) at 11/11/13 0815 Last data filed at 11/11/13 0038  Gross per 24 hour  Intake   1020 ml  Output    400 ml  Net    620 ml   Filed Weights   11/09/13 2358 11/10/13 2359 11/11/13 0500  Weight: 274 lb 0.5 oz (124.3 kg) 274 lb 11.1 oz (124.6 kg) 274 lb 11.1 oz (124.6 kg)    PHYSICAL EXAM  General: Pleasant, 73 yo man, NAD. Neuro: Alert and oriented X 3. Moves all extremities spontaneously. HEENT:  Normal  Neck: Supple without bruits or JVD. Lungs:  Resp regular and unlabored, CTA. Heart: RRR no s3, s4, or murmurs. Abdomen: Soft, non-tender, non-distended, BS + x 4.  Extremities: No clubbing, cyanosis or edema. DP/PT/Radials 2+ and  equal bilaterally.  Accessory Clinical Findings  CBC  Recent Labs  11/09/13 1131 11/10/13 0505 11/11/13 0435  WBC 9.2 10.2 11.4*  NEUTROABS 6.0  --   --   HGB 12.9* 12.9* 13.0  HCT 41.9 42.8 41.7  MCV 82.0 81.4 80.3  PLT 240 235 950   Basic Metabolic Panel  Recent Labs  11/09/13 1131 11/09/13 1909 11/10/13 0505 11/11/13 0435  NA 143  --  142 141  K 4.5  --  4.1 4.0  CL 102  --  104 104  CO2 25  --  25 19  GLUCOSE 198*  --  171* 190*  BUN 14  --  11 13  CREATININE 0.85  --  0.74 0.74  CALCIUM 9.4  --  9.7 9.2  MG  --  1.8  --   --    Liver Function Tests  Recent Labs  11/10/13 0505  AST 46*  ALT 24  ALKPHOS 58  BILITOT 0.5  PROT 7.1  ALBUMIN 3.7   No results found for this basename: LIPASE, AMYLASE,  in the last 72 hours Cardiac Enzymes  Recent Labs  11/09/13 1909 11/09/13  2345 11/10/13 0505  TROPONINI 3.62* 3.37* 3.07*   BNP No components found with this basename: POCBNP,  D-Dimer No results found for this basename: DDIMER,  in the last 72 hours Hemoglobin A1C  Recent Labs  11/09/13 1909  HGBA1C 7.5*   Fasting Lipid Panel  Recent Labs  11/10/13 0505  CHOL 155  HDL 30*  LDLCALC 68  TRIG 285*  CHOLHDL 5.2   Thyroid Function Tests  Recent Labs  11/09/13 1909  TSH 3.190    TELE NSR  ECG - nsr with RBBB Radiology/Studies  Dg Chest Port 1 View  11/09/2013   CLINICAL DATA:  Shortness of breath  EXAM: PORTABLE CHEST - 1 VIEW  COMPARISON:  09/21/2012  FINDINGS: Cardiac shadow remains enlarged. The lungs are well aerated bilaterally. No focal infiltrate or sizable effusion is seen. No acute bony abnormality is noted.  IMPRESSION: No acute abnormality seen.   Electronically Signed   By: Inez Catalina M.D.   On: 11/09/2013 11:30    ASSESSMENT AND PLAN  1. NSTEMI 2. S/p very difficult PCI with stent Rec: ok for discharge home. Will send home on Brillinta, and sl ntg which are new and coreg 12.5 mg twice daily (instead of  atenolol). followup with Dr. Domenic Polite in [place].  Kelani Robart,M.D.  11/11/2013 8:15 AM

## 2013-11-13 MED FILL — Sodium Chloride IV Soln 0.9%: INTRAVENOUS | Qty: 50 | Status: AC

## 2013-11-16 ENCOUNTER — Encounter: Payer: Self-pay | Admitting: Cardiology

## 2013-11-16 ENCOUNTER — Telehealth: Payer: Self-pay | Admitting: Family Medicine

## 2013-11-16 NOTE — Telephone Encounter (Signed)
No, just have the patient followup we will discuss lab work from the hospitalization when he comes

## 2013-11-16 NOTE — Telephone Encounter (Signed)
Please let the family know that I am aware of his hospitalization and heart attack. They actually did a lot of blood work while he was in the hospital. We will only need to do a hemoglobin A1c here in the office when he follows up in a week and review over his lab work from the hospital as well as the A1c. Bring all medications to the visit.

## 2013-11-16 NOTE — Telephone Encounter (Signed)
Pt has med check for 6/16 and needs BW done, please call when ready  Spouse would also like Dr Nicki Reaper to know pt had a heart attack last week

## 2013-11-16 NOTE — Telephone Encounter (Signed)
Patient had cbc, met 7, Hgba1c, lipid,tsh and mag.  11/10/13

## 2013-11-16 NOTE — Telephone Encounter (Signed)
A1c was 7.5 in hospital 11/09/13 does he need it repeated?

## 2013-11-16 NOTE — Progress Notes (Signed)
Clinical Summary Mr. Shoultz is a 73 y.o.male presenting for a post hospital visit. He is a former patient of Dr. Verl Blalock, this is our first meeting today. Records reviewed with cardiac history detailed below. He was recently discharged following NSTEMI with placement of staged DES to the RCA and LAD in late May.  Recent lab work showed potassium 4.0, BUN 13, creatinine 0.7, hemoglobin 13.0, platelets 241, cholesterol 155, triglycerides 285, HDL 30, LDL 68.  He has been doing well so far, reports no angina or unusual breathlessness, no bleeding problems. He is functionally limited by chronic back pain, had surgery a little over a year ago, and uses a cane to walk. He tells that he is not interested in cardiac rehabilitation. We did talk about a basic walking regimen for exercise.  We reviewed his medications.   Allergies  Allergen Reactions  . Penicillins Hives, Itching and Swelling  . Atorvastatin   . Other     Bee stings    Current Outpatient Prescriptions  Medication Sig Dispense Refill  . amLODipine (NORVASC) 10 MG tablet Take 10 mg by mouth daily.      Marland Kitchen aspirin 81 MG tablet Take 81 mg by mouth daily.        . Canagliflozin (INVOKANA) 100 MG TABS Take 100 mg by mouth daily.      . carvedilol (COREG) 12.5 MG tablet Take 1 tablet (12.5 mg total) by mouth 2 (two) times daily with a meal.  60 tablet  6  . insulin aspart (NOVOLOG FLEXPEN) 100 UNIT/ML FlexPen Inject 25 Units into the skin 2 (two) times daily.      . insulin glargine (LANTUS) 100 UNIT/ML injection Inject 60 Units into the skin 2 (two) times daily.      Marland Kitchen lisinopril (PRINIVIL,ZESTRIL) 40 MG tablet Take 1 tablet (40 mg total) by mouth daily.  90 tablet  3  . metFORMIN (GLUCOPHAGE) 1000 MG tablet Take 1 tablet (1,000 mg total) by mouth 2 (two) times daily with a meal.      . nitroGLYCERIN (NITROSTAT) 0.4 MG SL tablet Place 1 tablet (0.4 mg total) under the tongue every 5 (five) minutes x 3 doses as needed for chest pain.   25 tablet  4  . oxyCODONE-acetaminophen (PERCOCET/ROXICET) 5-325 MG per tablet       . pravastatin (PRAVACHOL) 40 MG tablet Take 40 mg by mouth every evening.      . ticagrelor (BRILINTA) 90 MG TABS tablet Take 1 tablet (90 mg total) by mouth 2 (two) times daily.  60 tablet  11   No current facility-administered medications for this visit.    Past Medical History  Diagnosis Date  . Coronary atherosclerosis of native coronary artery     DES RCA and staged DES LAD May 2015, LVEF 55%  . Hyperlipidemia   . Essential hypertension, benign   . Obesity   . Lumbar back pain     Spondylosis and spondylolisthesis   . Nephrolithiasis   . Asbestosis(501)   . Anemia, iron deficiency   . Hx of adenomatous colonic polyps   . Sciatica of right side   . Myocardial infarction 2004  . Type II diabetes mellitus   . Gout   . Anxiety   . Kidney carcinoma 1998  . NSTEMI (non-ST elevated myocardial infarction)     May 2015    Social History Mr. Kretschmer reports that he has never smoked. His smokeless tobacco use includes Chew. Mr. Saleeby reports that he  does not drink alcohol.  Review of Systems Head no palpitations, dizziness, syncope. Otherwise as outlined.  Physical Examination Filed Vitals:   11/17/13 0933  BP: 136/58  Pulse: 92   Filed Weights   11/17/13 0933  Weight: 268 lb (121.564 kg)   Obese male, no distress. HEENT: Conjunctiva and lids normal, oropharynx clear. Neck: Supple, no elevated JVP, increased girth, no thyromegaly. Lungs: Clear to auscultation, nonlabored breathing at rest. Cardiac: Regular rate and rhythm, no S3 or significant systolic murmur, no pericardial rub. Abdomen: Soft, nontender, bowel sounds present, no guarding or rebound. Extremities: No pitting edema, distal pulses 2+. Skin: Warm and dry. Musculoskeletal: No kyphosis. Neuropsychiatric: Alert and oriented x3, affect grossly appropriate.   Problem List and Plan   Coronary atherosclerosis of native  coronary artery Status post recent NSTEMI with staged intervention, DES to the RCA and DES to the LAD. LVEF normal range. He is symptomatically stable on medical therapy. Gradually increase activity through walking regimen. He tells me that he is not interested in cardiac debilitation. Three month visit scheduled.  Essential hypertension Blood pressure reasonable today, no changes made.  HYPERLIPIDEMIA-MIXED Continues on Pravachol, recent LDL 68.  Diabetes mellitus type II, uncontrolled Pending followup with Dr. Wolfgang Phoenix.    [name], M.D., F.A.C.C.

## 2013-11-17 ENCOUNTER — Encounter: Payer: Self-pay | Admitting: Cardiology

## 2013-11-17 ENCOUNTER — Ambulatory Visit (INDEPENDENT_AMBULATORY_CARE_PROVIDER_SITE_OTHER): Payer: Medicare Other | Admitting: Cardiology

## 2013-11-17 VITALS — BP 136/58 | HR 92 | Ht 74.0 in | Wt 268.0 lb

## 2013-11-17 DIAGNOSIS — I10 Essential (primary) hypertension: Secondary | ICD-10-CM

## 2013-11-17 DIAGNOSIS — I2510 Atherosclerotic heart disease of native coronary artery without angina pectoris: Secondary | ICD-10-CM | POA: Diagnosis not present

## 2013-11-17 DIAGNOSIS — IMO0001 Reserved for inherently not codable concepts without codable children: Secondary | ICD-10-CM | POA: Diagnosis not present

## 2013-11-17 DIAGNOSIS — E1165 Type 2 diabetes mellitus with hyperglycemia: Secondary | ICD-10-CM

## 2013-11-17 DIAGNOSIS — E785 Hyperlipidemia, unspecified: Secondary | ICD-10-CM

## 2013-11-17 DIAGNOSIS — IMO0002 Reserved for concepts with insufficient information to code with codable children: Secondary | ICD-10-CM

## 2013-11-17 NOTE — Assessment & Plan Note (Signed)
Status post recent NSTEMI with staged intervention, DES to the RCA and DES to the LAD. LVEF normal range. He is symptomatically stable on medical therapy. Gradually increase activity through walking regimen. He tells me that he is not interested in cardiac debilitation. Three month visit scheduled.

## 2013-11-17 NOTE — Telephone Encounter (Signed)
Patient was notified.

## 2013-11-17 NOTE — Assessment & Plan Note (Signed)
Blood pressure reasonable today, no changes made.

## 2013-11-17 NOTE — Assessment & Plan Note (Signed)
Continues on Pravachol, recent LDL 68.

## 2013-11-17 NOTE — Assessment & Plan Note (Signed)
Pending followup with Dr. Wolfgang Phoenix.

## 2013-11-17 NOTE — Patient Instructions (Addendum)
Your physician recommends that you schedule a follow-up appointment in: 3 months     Your physician recommends that you continue on your current medications as directed. Please refer to the Current Medication list given to you today.   i have given you samples of Brilinta today    Thank you for choosing [facility] !

## 2013-11-28 ENCOUNTER — Other Ambulatory Visit: Payer: Self-pay | Admitting: Family Medicine

## 2013-11-28 ENCOUNTER — Ambulatory Visit (INDEPENDENT_AMBULATORY_CARE_PROVIDER_SITE_OTHER): Payer: Medicare Other | Admitting: Family Medicine

## 2013-11-28 VITALS — BP 142/78 | Ht 74.0 in | Wt 279.0 lb

## 2013-11-28 DIAGNOSIS — IMO0001 Reserved for inherently not codable concepts without codable children: Secondary | ICD-10-CM

## 2013-11-28 DIAGNOSIS — E1165 Type 2 diabetes mellitus with hyperglycemia: Secondary | ICD-10-CM

## 2013-11-28 DIAGNOSIS — E785 Hyperlipidemia, unspecified: Secondary | ICD-10-CM | POA: Diagnosis not present

## 2013-11-28 DIAGNOSIS — IMO0002 Reserved for concepts with insufficient information to code with codable children: Secondary | ICD-10-CM

## 2013-11-28 DIAGNOSIS — I2510 Atherosclerotic heart disease of native coronary artery without angina pectoris: Secondary | ICD-10-CM | POA: Diagnosis not present

## 2013-11-28 DIAGNOSIS — I10 Essential (primary) hypertension: Secondary | ICD-10-CM

## 2013-11-28 MED ORDER — INSULIN GLARGINE 100 UNIT/ML ~~LOC~~ SOLN: 60.0000 [IU] | Freq: Two times a day (BID) | SUBCUTANEOUS | Status: DC

## 2013-11-28 MED ORDER — METFORMIN HCL 1000 MG PO TABS: 1000.0000 mg | ORAL_TABLET | Freq: Two times a day (BID) | ORAL | Status: DC

## 2013-11-28 MED ORDER — LISINOPRIL 40 MG PO TABS: 40.0000 mg | ORAL_TABLET | Freq: Every day | ORAL | Status: DC

## 2013-11-28 MED ORDER — AMLODIPINE BESYLATE 10 MG PO TABS: 10.0000 mg | ORAL_TABLET | Freq: Every day | ORAL | Status: DC

## 2013-11-28 MED ORDER — CANAGLIFLOZIN 100 MG PO TABS: 100.0000 mg | ORAL_TABLET | Freq: Every day | ORAL | Status: DC

## 2013-11-28 MED ORDER — PRAVASTATIN SODIUM 40 MG PO TABS: 40.0000 mg | ORAL_TABLET | Freq: Every evening | ORAL | Status: DC

## 2013-11-28 MED ORDER — CARVEDILOL 12.5 MG PO TABS: 12.5000 mg | ORAL_TABLET | Freq: Two times a day (BID) | ORAL | Status: DC

## 2013-11-28 MED ORDER — INSULIN ASPART 100 UNIT/ML FLEXPEN: 25.0000 [IU] | PEN_INJECTOR | Freq: Two times a day (BID) | SUBCUTANEOUS | Status: DC

## 2013-11-28 NOTE — Progress Notes (Signed)
   Subjective:    Patient ID: Francisco Powell, male    DOB: April 25, 1941, 73 y.o.   MRN: [id number]  Diabetes He presents for his initial diabetic visit. He has type 2 diabetes mellitus. Pertinent negatives for hypoglycemia include no confusion. Pertinent negatives for diabetes include no chest pain, no fatigue, no polydipsia, no polyphagia and no weakness. Risk factors for coronary artery disease include dyslipidemia, diabetes mellitus and obesity. Current diabetic treatment includes insulin injections and oral agent (monotherapy). He is compliant with treatment all of the time. He is following a diabetic diet.   this patient has following his sugars very closely in the morning they're usually in the range of 95-15 later in the day in the low 100s he did have some low sugar spells but this is gone away. He denies any chest tightness pressure or pain.  Recently in the hospital with 2 stents he is actually recovering well from that. Patient has been able to lose some weight with exercise as well as diet He does have hyperlipidemia we talked at length about a heart healthy diet taking his medications We also talked about how his A1c is improved compared where it was but we want to see a even better if possible. Recheck his lab work pulse 3 months. Patient does have significant obesity back pain and orthopedic problems with been doing regular walking or   Review of Systems  Constitutional: Negative for activity change, appetite change and fatigue.  HENT: Negative for congestion.   Respiratory: Negative for cough.   Cardiovascular: Negative for chest pain.  Gastrointestinal: Negative for abdominal pain.  Endocrine: Negative for polydipsia and polyphagia.  Neurological: Negative for weakness.  Psychiatric/Behavioral: Negative for confusion.       Objective:   Physical Exam  Vitals reviewed. Constitutional: He appears well-nourished. No distress.  Cardiovascular: Normal rate, regular rhythm  and normal heart sounds.   No murmur heard. Pulmonary/Chest: Effort normal and breath sounds normal. No respiratory distress.  Musculoskeletal: He exhibits no edema.  Lymphadenopathy:    He has no cervical adenopathy.  Neurological: He is alert.  Psychiatric: His behavior is normal.   moderate obesity Extremities no significant edema foot exam normal       Assessment & Plan:  1. Essential hypertension Blood pressure under good control today. Continue current measures watch diet avoid extra salt  2. Coronary atherosclerosis of native coronary artery No sign of any blockage she is stressed the importance of taking his medicine to prevent re\re block age.  3. Diabetes mellitus type II, uncontrolled A1c is improved he is to work hard on diet we will refill all of his medications will followup in 3 months  4. HYPERLIPIDEMIA-MIXED Lipid profile in the hospital look good continue current medication watch diet.  In 3 months time he will need a hemoglobin A1c along with a metabolic 7, CBC, PSA., Lipid-PSA as for screening purposes CBC because of anemia metabolic 7 because of renal insufficiency

## 2013-11-28 NOTE — Patient Instructions (Signed)
Remember to do labs before visit in 3 months

## 2014-01-12 ENCOUNTER — Encounter: Payer: Self-pay | Admitting: Family Medicine

## 2014-01-12 ENCOUNTER — Telehealth: Payer: Self-pay | Admitting: Family Medicine

## 2014-01-12 ENCOUNTER — Ambulatory Visit (INDEPENDENT_AMBULATORY_CARE_PROVIDER_SITE_OTHER): Payer: Medicare Other | Admitting: Family Medicine

## 2014-01-12 VITALS — BP 120/82 | Ht 74.0 in | Wt 280.4 lb

## 2014-01-12 DIAGNOSIS — F329 Major depressive disorder, single episode, unspecified: Secondary | ICD-10-CM | POA: Diagnosis not present

## 2014-01-12 DIAGNOSIS — I2510 Atherosclerotic heart disease of native coronary artery without angina pectoris: Secondary | ICD-10-CM

## 2014-01-12 DIAGNOSIS — R51 Headache: Secondary | ICD-10-CM

## 2014-01-12 DIAGNOSIS — F3289 Other specified depressive episodes: Secondary | ICD-10-CM | POA: Diagnosis not present

## 2014-01-12 DIAGNOSIS — F32A Depression, unspecified: Secondary | ICD-10-CM

## 2014-01-12 MED ORDER — OXYCODONE-ACETAMINOPHEN 5-325 MG PO TABS: 1.0000 | ORAL_TABLET | Freq: Four times a day (QID) | ORAL | Status: DC | PRN

## 2014-01-12 MED ORDER — VALACYCLOVIR HCL 1 G PO TABS: 1000.0000 mg | ORAL_TABLET | Freq: Three times a day (TID) | ORAL | Status: DC

## 2014-01-12 MED ORDER — CITALOPRAM HYDROBROMIDE 20 MG PO TABS: 20.0000 mg | ORAL_TABLET | Freq: Every day | ORAL | Status: DC

## 2014-01-12 NOTE — Telephone Encounter (Signed)
Patients wife would like a nurse to call her back and discuss what is going on with Francisco Powell. She said he was diagnosed with shingles a couple of months ago, and now they seem to be coming back in the same place she believes.

## 2014-01-12 NOTE — Telephone Encounter (Signed)
Transferred up front to make appt for today.

## 2014-01-12 NOTE — Progress Notes (Signed)
   Subjective:    Patient ID: Francisco Powell, male    DOB: [birth year], 73 y.o.   MRN: [id number]  HPI  Patient arrives with complaint of probable shingles on right side of head since Wed.  Patient stated he had shingles in same area back in April. Patient is adamant that the discomfort he is experiencing is exactly like to shingles that he experienced in the spring. No actual rash yet.  Wife also mentions towards the end of the visit the patient is feeling down since his heart attack earlier in the year. Patient agrees with this. No suicidal or homicidal thoughts. Just diminished energy. Diminished made. Not interested in involving himself in usual activities. Never has had to be on medicine for depression per   Review of Systems    no chest pain no back pain no fever no actual rash ROS otherwise negative Objective:   Physical Exam Alert no acute distress right lateral scalp hypersensitive no obvious rash noted neck supple. Lungs clear. Heart regular rate and rhythm. Alert oriented affect appropriate       Assessment & Plan:  Impression #1 possible recurrence of shingles #2 depression post cardiac discussed at length common occurrence. Potential intervention discussed. Pros and cons of medicine discussed. Plan Valtrex 3 times a day 7 days. Initiate Celexa. Followup with Dr. Nicki Reaper at regular checkup. With additional concerns brought up right at the end of the visit and additional 10-12 minutes spent. Total of 25 minutes spent most in discussion. WSL

## 2014-01-12 NOTE — Telephone Encounter (Signed)
Made appointment for today

## 2014-01-14 DIAGNOSIS — F32A Depression, unspecified: Secondary | ICD-10-CM | POA: Insufficient documentation

## 2014-01-14 DIAGNOSIS — F329 Major depressive disorder, single episode, unspecified: Secondary | ICD-10-CM | POA: Insufficient documentation

## 2014-01-29 ENCOUNTER — Telehealth: Payer: Self-pay | Admitting: Family Medicine

## 2014-01-29 DIAGNOSIS — Z800 Family history of malignant neoplasm of digestive organs: Secondary | ICD-10-CM

## 2014-01-29 NOTE — Telephone Encounter (Signed)
Referral to GI for colonoscopy placed in Epic.

## 2014-01-29 NOTE — Telephone Encounter (Signed)
It is okay to put the referral in, but-if he has established times already with the gastroenterologist then insurance companies allow for the patient to make a direct appointment without referral. Certainly if the doctor's office requires a referral then call us back

## 2014-01-29 NOTE — Telephone Encounter (Signed)
Patient is due for a colonoscopy. Would like this set up with Dr. Laural Golden. He gets one every 2 years because his mom died of colon cancer.

## 2014-01-29 NOTE — Telephone Encounter (Signed)
OK to put referral in?

## 2014-02-05 ENCOUNTER — Telehealth: Payer: Self-pay | Admitting: Family Medicine

## 2014-02-05 MED ORDER — CITALOPRAM HYDROBROMIDE 10 MG PO TABS: 10.0000 mg | ORAL_TABLET | Freq: Every day | ORAL | Status: DC

## 2014-02-05 NOTE — Telephone Encounter (Signed)
Celexa can cause diarrhea in about 8% of individuals. I would recommend reducing the dose to 10 mg. I would also recommend followup in approximately 30 days to see how medication is working. Followup sooner if problems.

## 2014-02-05 NOTE — Telephone Encounter (Signed)
Notified Wells Guiles (wife) Celexa can cause diarrhea in about 8% of individuals. Recommend reducing the dose to 10 mg.  Also recommend followup in approximately 30 days to see how medication is working. Followup sooner if problems. Wife verbalized understanding.

## 2014-02-05 NOTE — Telephone Encounter (Signed)
citalopram (CELEXA) 20 MG tablet   Pt was put on this med end of July, pt has had diarrhea since on This med, pt an spouse are wondering if it has anything to do with  This med?  FYI: He has a letter on Friday to schedule a colonoscopy in the near future   Rodney

## 2014-02-20 ENCOUNTER — Telehealth: Payer: Self-pay | Admitting: Family Medicine

## 2014-02-20 DIAGNOSIS — E119 Type 2 diabetes mellitus without complications: Secondary | ICD-10-CM

## 2014-02-20 DIAGNOSIS — E785 Hyperlipidemia, unspecified: Secondary | ICD-10-CM

## 2014-02-20 DIAGNOSIS — Z79899 Other long term (current) drug therapy: Secondary | ICD-10-CM

## 2014-02-20 NOTE — Telephone Encounter (Signed)
Patient needs order for blood work. °

## 2014-02-20 NOTE — Telephone Encounter (Signed)
Lipid, liver, hemoglobin A1c, urine micro-protein

## 2014-02-21 DIAGNOSIS — Z79899 Other long term (current) drug therapy: Secondary | ICD-10-CM | POA: Diagnosis not present

## 2014-02-21 DIAGNOSIS — E119 Type 2 diabetes mellitus without complications: Secondary | ICD-10-CM | POA: Diagnosis not present

## 2014-02-21 DIAGNOSIS — E785 Hyperlipidemia, unspecified: Secondary | ICD-10-CM | POA: Diagnosis not present

## 2014-02-21 NOTE — Telephone Encounter (Signed)
Bloodwork was ordered. Patient was notified.  

## 2014-02-23 LAB — HEPATIC FUNCTION PANEL
ALT: 20 U/L (ref 0–53)
AST: 31 U/L (ref 0–37)
Albumin: 4.2 g/dL (ref 3.5–5.2)
Alkaline Phosphatase: 50 U/L (ref 39–117)
BILIRUBIN INDIRECT: 0.3 mg/dL (ref 0.2–1.2)
Bilirubin, Direct: 0.1 mg/dL (ref 0.0–0.3)
Total Bilirubin: 0.4 mg/dL (ref 0.2–1.2)
Total Protein: 6.8 g/dL (ref 6.0–8.3)

## 2014-02-23 LAB — MICROALBUMIN, URINE: Microalb, Ur: 5.84 mg/dL — ABNORMAL HIGH (ref 0.00–1.89)

## 2014-02-23 LAB — HEMOGLOBIN A1C
Hgb A1c MFr Bld: 7.3 % — ABNORMAL HIGH (ref <5.7)
MEAN PLASMA GLUCOSE: 163 mg/dL — AB (ref <117)

## 2014-02-23 LAB — LIPID PANEL
Cholesterol: 154 mg/dL (ref 0–200)
HDL: 34 mg/dL — ABNORMAL LOW (ref >39)
LDL CALC: 65 mg/dL (ref 0–99)
Total CHOL/HDL Ratio: 4.5 Ratio
Triglycerides: 273 mg/dL — ABNORMAL HIGH (ref <150)
VLDL: 55 mg/dL — AB (ref 0–40)

## 2014-02-27 ENCOUNTER — Ambulatory Visit (INDEPENDENT_AMBULATORY_CARE_PROVIDER_SITE_OTHER): Payer: Medicare Other | Admitting: Cardiology

## 2014-02-27 ENCOUNTER — Encounter: Payer: Self-pay | Admitting: Cardiology

## 2014-02-27 VITALS — BP 150/72 | HR 72 | Ht 74.0 in | Wt 280.0 lb

## 2014-02-27 DIAGNOSIS — E1165 Type 2 diabetes mellitus with hyperglycemia: Secondary | ICD-10-CM

## 2014-02-27 DIAGNOSIS — I10 Essential (primary) hypertension: Secondary | ICD-10-CM | POA: Diagnosis not present

## 2014-02-27 DIAGNOSIS — I2510 Atherosclerotic heart disease of native coronary artery without angina pectoris: Secondary | ICD-10-CM | POA: Diagnosis not present

## 2014-02-27 DIAGNOSIS — IMO0001 Reserved for inherently not codable concepts without codable children: Secondary | ICD-10-CM | POA: Diagnosis not present

## 2014-02-27 DIAGNOSIS — E785 Hyperlipidemia, unspecified: Secondary | ICD-10-CM | POA: Diagnosis not present

## 2014-02-27 DIAGNOSIS — IMO0002 Reserved for concepts with insufficient information to code with codable children: Secondary | ICD-10-CM

## 2014-02-27 NOTE — Assessment & Plan Note (Signed)
Keep followup with Dr. Wolfgang Phoenix. Hemoglobin A1c 7.3.

## 2014-02-27 NOTE — Assessment & Plan Note (Signed)
No change current regimen. Try and work on additional weight loss and diet.

## 2014-02-27 NOTE — Progress Notes (Signed)
Clinical Summary Francisco Powell is a 73 y.o.male last seen in June. He is here with his wife today. No significant change from a cardiac perspective. He denies any angina symptoms, has had no bleeding problems on DAPT.  Recent lab work shows cholesterol 154, triglycerides 173, HDL 34, LDL 65, normal LFTs, hemoglobin A1c 7.3. He will be following up with Dr. Wolfgang Phoenix tomorrow.  He is status post NSTEMI with placement of staged DES to the RCA and LAD in late May. We plan a one-year course of DAPT.   Allergies  Allergen Reactions  . Penicillins Hives, Itching and Swelling  . Atorvastatin   . Other     Bee stings    Current Outpatient Prescriptions  Medication Sig Dispense Refill  . amLODipine (NORVASC) 10 MG tablet Take 1 tablet (10 mg total) by mouth daily.  90 tablet  2  . aspirin 81 MG tablet Take 81 mg by mouth daily.        . carvedilol (COREG) 12.5 MG tablet Take 1 tablet (12.5 mg total) by mouth 2 (two) times daily with a meal.  60 tablet  6  . insulin aspart (NOVOLOG FLEXPEN) 100 UNIT/ML FlexPen Inject 25 Units into the skin 2 (two) times daily.  15 mL  11  . insulin glargine (LANTUS) 100 UNIT/ML injection Inject 0.6 mLs (60 Units total) into the skin 2 (two) times daily.  10 mL  11  . INVOKANA 100 MG TABS TAKE (1) TABLET BY MOUTH EACH MORNING.  30 tablet  5  . lisinopril (PRINIVIL,ZESTRIL) 40 MG tablet Take 1 tablet (40 mg total) by mouth daily.  90 tablet  3  . metFORMIN (GLUCOPHAGE) 1000 MG tablet Take 1 tablet (1,000 mg total) by mouth 2 (two) times daily with a meal.  180 tablet  2  . nitroGLYCERIN (NITROSTAT) 0.4 MG SL tablet Place 1 tablet (0.4 mg total) under the tongue every 5 (five) minutes x 3 doses as needed for chest pain.  25 tablet  4  . oxyCODONE-acetaminophen (PERCOCET/ROXICET) 5-325 MG per tablet Take 1 tablet by mouth every 6 (six) hours as needed for severe pain.  30 tablet  0  . pravastatin (PRAVACHOL) 40 MG tablet Take 1 tablet (40 mg total) by mouth every  evening.  90 tablet  2  . ticagrelor (BRILINTA) 90 MG TABS tablet Take 1 tablet (90 mg total) by mouth 2 (two) times daily.  60 tablet  11   No current facility-administered medications for this visit.    Past Medical History  Diagnosis Date  . Coronary atherosclerosis of native coronary artery     DES RCA and staged DES LAD May 2015, LVEF 55%  . Hyperlipidemia   . Essential hypertension, benign   . Obesity   . Lumbar back pain     Spondylosis and spondylolisthesis   . Nephrolithiasis   . Asbestosis(501)   . Anemia, iron deficiency   . Hx of adenomatous colonic polyps   . Sciatica of right side   . Myocardial infarction 2004  . Type II diabetes mellitus   . Gout   . Anxiety   . Kidney carcinoma 1998  . NSTEMI (non-ST elevated myocardial infarction)     May 2015    Social History Francisco Powell reports that he has never smoked. His smokeless tobacco use includes Chew. Francisco Powell reports that he does not drink alcohol.  Review of Systems Has not been particularly active. He has been trying to lose weight, but  has trouble maintaining a stable diet. No orthopnea or PND. Other systems reviewed and negative except as outlined.  Physical Examination Filed Vitals:   02/27/14 0818  BP: 150/72  Pulse: 72   Filed Weights   02/27/14 0818  Weight: 280 lb (127.007 kg)    Obese male, no distress.  HEENT: Conjunctiva and lids normal, oropharynx clear.  Neck: Supple, no elevated JVP, increased girth, no thyromegaly.  Lungs: Clear to auscultation, nonlabored breathing at rest.  Cardiac: Regular rate and rhythm, no S3 or significant systolic murmur, no pericardial rub.  Abdomen: Soft, nontender, bowel sounds present, no guarding or rebound.  Extremities: No pitting edema, distal pulses 2+.  Skin: Warm and dry.  Musculoskeletal: No kyphosis.  Neuropsychiatric: Alert and oriented x3, affect grossly appropriate.   Problem List and Plan   Coronary atherosclerosis of native  coronary artery Symptomatically stable, no angina symptoms on medical therapy. No changes made today. Followup in 6 months.  HYPERLIPIDEMIA-MIXED Recent LDL 65. Continue Pravachol.  Diabetes mellitus type II, uncontrolled Keep followup with Dr. Wolfgang Phoenix. Hemoglobin A1c 7.3.  Essential hypertension No change current regimen. Try and work on additional weight loss and diet.    [name], M.D., F.A.C.C.

## 2014-02-27 NOTE — Patient Instructions (Signed)
Your physician wants you to follow-up in: 6 months You will receive a reminder letter in the mail two months in advance. If you don't receive a letter, please call our office to schedule the follow-up appointment.     Your physician recommends that you continue on your current medications as directed. Please refer to the Current Medication list given to you today.      Thank you for choosing [facility] !        

## 2014-02-27 NOTE — Assessment & Plan Note (Signed)
Recent LDL 65. Continue Pravachol.

## 2014-02-27 NOTE — Assessment & Plan Note (Signed)
Symptomatically stable, no angina symptoms on medical therapy. No changes made today. Followup in 6 months.

## 2014-02-28 ENCOUNTER — Ambulatory Visit (INDEPENDENT_AMBULATORY_CARE_PROVIDER_SITE_OTHER): Payer: Medicare Other | Admitting: Family Medicine

## 2014-02-28 ENCOUNTER — Encounter: Payer: Self-pay | Admitting: Family Medicine

## 2014-02-28 VITALS — BP 144/78 | Ht 74.0 in | Wt 281.0 lb

## 2014-02-28 DIAGNOSIS — B0229 Other postherpetic nervous system involvement: Secondary | ICD-10-CM | POA: Diagnosis not present

## 2014-02-28 DIAGNOSIS — IMO0001 Reserved for inherently not codable concepts without codable children: Secondary | ICD-10-CM | POA: Diagnosis not present

## 2014-02-28 DIAGNOSIS — I2510 Atherosclerotic heart disease of native coronary artery without angina pectoris: Secondary | ICD-10-CM | POA: Diagnosis not present

## 2014-02-28 DIAGNOSIS — E1165 Type 2 diabetes mellitus with hyperglycemia: Secondary | ICD-10-CM

## 2014-02-28 DIAGNOSIS — IMO0002 Reserved for concepts with insufficient information to code with codable children: Secondary | ICD-10-CM

## 2014-02-28 MED ORDER — LISINOPRIL 40 MG PO TABS: 40.0000 mg | ORAL_TABLET | Freq: Every day | ORAL | Status: DC

## 2014-02-28 MED ORDER — OXYCODONE-ACETAMINOPHEN 10-325 MG PO TABS: 1.0000 | ORAL_TABLET | Freq: Four times a day (QID) | ORAL | Status: DC | PRN

## 2014-02-28 MED ORDER — CARVEDILOL 12.5 MG PO TABS: 12.5000 mg | ORAL_TABLET | Freq: Two times a day (BID) | ORAL | Status: DC

## 2014-02-28 MED ORDER — CANAGLIFLOZIN 300 MG PO TABS: 300.0000 mg | ORAL_TABLET | ORAL | Status: DC

## 2014-02-28 MED ORDER — CANAGLIFLOZIN 300 MG PO TABS: 300.0000 mg | ORAL_TABLET | Freq: Every day | ORAL | Status: DC

## 2014-02-28 MED ORDER — PRAVASTATIN SODIUM 40 MG PO TABS: 40.0000 mg | ORAL_TABLET | Freq: Every evening | ORAL | Status: DC

## 2014-02-28 NOTE — Progress Notes (Signed)
   Subjective:    Patient ID: Francisco Powell, male    DOB: 05-04-1941, 73 y.o.   MRN: [id number]  Diabetes He presents for his follow-up diabetic visit. He has type 2 diabetes mellitus. Pertinent negatives for hypoglycemia include no confusion. Pertinent negatives for diabetes include no chest pain, no fatigue, no polydipsia, no polyphagia and no weakness. Current diabetic treatment includes insulin injections and oral agent (monotherapy). He is compliant with treatment all of the time. Insulin injections are given by patient. He is following a diabetic diet. He rarely participates in exercise. His home blood glucose trend is increasing steadily. He does not see a podiatrist.Eye exam is current.  Bloodwork on 02/21/14. A1C was 7.3. Recheck shingles on right side of head and neck. Finished 2 rounds valtrex and oxycodone not helping with the pain.    Review of Systems  Constitutional: Negative for activity change, appetite change and fatigue.  HENT: Negative for congestion.   Respiratory: Negative for cough.   Cardiovascular: Negative for chest pain.  Gastrointestinal: Negative for abdominal pain.  Endocrine: Negative for polydipsia and polyphagia.  Neurological: Negative for weakness.  Psychiatric/Behavioral: Negative for confusion.       Objective:   Physical Exam  Vitals reviewed. Constitutional: He appears well-nourished. No distress.  Cardiovascular: Normal rate, regular rhythm and normal heart sounds.   No murmur heard. Pulmonary/Chest: Effort normal and breath sounds normal. No respiratory distress.  Musculoskeletal: He exhibits no edema.  Lymphadenopathy:    He has no cervical adenopathy.  Neurological: He is alert.  Psychiatric: His behavior is normal.          Assessment & Plan:  Shingles pain-we talked about Neurontin and Lyrica patient will hold off on these currently. Will use oxycodone when necessary caution drowsiness. Patient states current 5 mg not doing enough  increased to 10 mg.  Diabetes- increased Invokana 300mg -trying to target get an A1c to 7. I educated him regarding the goals of sugar management dietary measures and physical activity.  Blood pressure stable  Recent cholesterol stable  25 minutes spent with patient

## 2014-03-06 ENCOUNTER — Ambulatory Visit (INDEPENDENT_AMBULATORY_CARE_PROVIDER_SITE_OTHER): Payer: Medicare Other | Admitting: Gastroenterology

## 2014-03-06 ENCOUNTER — Encounter: Payer: Self-pay | Admitting: Gastroenterology

## 2014-03-06 VITALS — BP 145/72 | HR 59 | Temp 98.0°F | Ht 74.0 in | Wt 283.2 lb

## 2014-03-06 DIAGNOSIS — I2510 Atherosclerotic heart disease of native coronary artery without angina pectoris: Secondary | ICD-10-CM

## 2014-03-06 DIAGNOSIS — Z800 Family history of malignant neoplasm of digestive organs: Secondary | ICD-10-CM | POA: Diagnosis not present

## 2014-03-06 DIAGNOSIS — A048 Other specified bacterial intestinal infections: Secondary | ICD-10-CM | POA: Insufficient documentation

## 2014-03-06 DIAGNOSIS — D369 Benign neoplasm, unspecified site: Secondary | ICD-10-CM | POA: Insufficient documentation

## 2014-03-06 MED ORDER — LUBIPROSTONE 8 MCG PO CAPS: 8.0000 ug | ORAL_CAPSULE | Freq: Two times a day (BID) | ORAL | Status: DC

## 2014-03-06 NOTE — Progress Notes (Signed)
Primary Care Physician:  Sallee Lange, MD Primary Gastroenterologist:  Dr. Gala Romney   Chief Complaint  Patient presents with  . Diarrhea    HPI:   Francisco Powell presents today for a visit prior to colonoscopy. Started on Celexa in late July 2015, then started having massive diarrhea. Stopped Celexa with resolution of diarrhea. Now back to baseline of constipation. Chronic narcotics. Has BM usually every day but has an "awful time". Takes fleet enemas. Getting started is difficult. On stool softeners intermittently. No hematochezia. Associated abdominal bloating and discomfort with constipation. No upper GI symptoms. Good appetite.   Last colonoscopy in June 2011 with normal rectum, left-sided diverticula. Last EGD at this time with erosive reflux esophagitis, small hiatal hernia, +H.pylori gastritis. Patient does not remember if he completed treatment or not. HE IS ON BRILINTA secondary to history of NSTEMI May 2015. He reports a history of adenomatous polyps in remote past; he also has a +FH of colon cancer in mother.   Past Medical History  Diagnosis Date  . Coronary atherosclerosis of native coronary artery     DES RCA and staged DES LAD May 2015, LVEF 55%  . Hyperlipidemia   . Essential hypertension, benign   . Obesity   . Lumbar back pain     Spondylosis and spondylolisthesis   . Nephrolithiasis   . Asbestosis(501)   . Anemia, iron deficiency   . Hx of adenomatous colonic polyps   . Sciatica of right side   . Myocardial infarction 2004  . Type II diabetes mellitus   . Gout   . Anxiety   . Kidney carcinoma 1998  . NSTEMI (non-ST elevated myocardial infarction)     May 2015    Past Surgical History  Procedure Laterality Date  . Holmium laser application Right 2706  . Cholecystectomy      Biliary dyskinesia  . Retinal detachment surgery Right 1998    Secondary to injury detached retina/cataracts  . Cystoscopy w/ litholapaxy / ehl  2009  . Appendectomy    .  Tonsillectomy    . Colonoscopy  2009    Dr. Arnoldo Morale: normal  . Back surgery  X 4  . Lumbar disc surgery  X 3  . Posterior lumbar fusion  2014  . Eye surgery    . Tumor excision  1995  . Esophagogastroduodenoscopy  June 2011    Dr. Gala Romney: erosive reflux esophagitis, small hiatal hernia, +H.pylori gastritis  . Colonoscopy  June 2011    Dr. Gala Romney: normal rectum, left-sided diverticula    Current Outpatient Prescriptions  Medication Sig Dispense Refill  . amLODipine (NORVASC) 10 MG tablet Take 1 tablet (10 mg total) by mouth daily.  90 tablet  2  . aspirin 81 MG tablet Take 81 mg by mouth daily.        . Canagliflozin (INVOKANA) 300 MG TABS Take 1 tablet (300 mg total) by mouth See admin instructions.  30 tablet  5  . carvedilol (COREG) 12.5 MG tablet Take 1 tablet (12.5 mg total) by mouth 2 (two) times daily with a meal.  60 tablet  5  . insulin aspart (NOVOLOG FLEXPEN) 100 UNIT/ML FlexPen Inject 25 Units into the skin 2 (two) times daily.  15 mL  11  . insulin glargine (LANTUS) 100 UNIT/ML injection Inject 0.6 mLs (60 Units total) into the skin 2 (two) times daily.  10 mL  11  . lisinopril (PRINIVIL,ZESTRIL) 40 MG tablet Take 1 tablet (40 mg  total) by mouth daily.  90 tablet  1  . metFORMIN (GLUCOPHAGE) 1000 MG tablet Take 1 tablet (1,000 mg total) by mouth 2 (two) times daily with a meal.  180 tablet  2  . nitroGLYCERIN (NITROSTAT) 0.4 MG SL tablet Place 1 tablet (0.4 mg total) under the tongue every 5 (five) minutes x 3 doses as needed for chest pain.  25 tablet  4  . oxyCODONE-acetaminophen (PERCOCET) 10-325 MG per tablet Take 1 tablet by mouth every 4 (four) hours as needed for pain.      . pravastatin (PRAVACHOL) 40 MG tablet Take 1 tablet (40 mg total) by mouth every evening.  90 tablet  1  . ticagrelor (BRILINTA) 90 MG TABS tablet Take 1 tablet (90 mg total) by mouth 2 (two) times daily.  60 tablet  11   No current facility-administered medications for this visit.    Allergies as  of 03/06/2014 - Review Complete 03/06/2014  Allergen Reaction Noted  . Penicillins Hives, Itching, and Swelling 04/15/2011  . Atorvastatin  10/31/2008  . Other  12/15/2012  . Celexa [citalopram]  02/28/2014    Family History  Problem Relation Age of Onset  . Colon cancer Mother     deceased   . Breast cancer Sister   . Cancer Maternal Grandmother     Gastric cancer  . Heart attack Father     History   Social History  . Marital Status: Married    Spouse Name: N/A    Number of Children: N/A  . Years of Education: N/A   Occupational History  . Not on file.   Social History Main Topics  . Smoking status: Never Smoker   . Smokeless tobacco: Current User    Types: Chew  . Alcohol Use: No  . Drug Use: No  . Sexual Activity: Yes   Other Topics Concern  . Not on file   Social History Narrative  . No narrative on file    Review of Systems: As mentioned in HPI  Physical Exam: BP 145/72  Pulse 59  Temp(Src) 98 F (36.7 C) (Oral)  Ht 6\' 2"  (1.88 m)  Wt 283 lb 3.2 oz (128.459 kg)  BMI 36.35 kg/m2 General:   Alert and oriented. Pleasant and cooperative. Well-nourished and well-developed.  Head:  Normocephalic and atraumatic. Eyes:  Without icterus, sclera clear and conjunctiva pink.  Ears:  Normal auditory acuity. Nose:  No deformity, discharge,  or lesions. Mouth:  No deformity or lesions, oral mucosa pink.  Lungs:  Clear to auscultation bilaterally. No wheezes, rales, or rhonchi. No distress.  Heart:  S1, S2 present without murmurs appreciated.  Abdomen:  +BS, soft, obese with large AP diameter. Unable to appreciate HSM due to large AP diameter.  Rectal:  Deferred  Msk:  Symmetrical without gross deformities. Normal posture. Extremities:  Trace ankle and pedal edema Neurologic:  Alert and  oriented x4;  grossly normal neurologically. Skin:  Intact without significant lesions or rashes. Psych:  Alert and cooperative. Normal mood and affect.

## 2014-03-06 NOTE — Assessment & Plan Note (Signed)
2011 on EGD. Check urea breath test now to document eradication.

## 2014-03-06 NOTE — Assessment & Plan Note (Signed)
In mother. Needs colonoscopy now.

## 2014-03-06 NOTE — Progress Notes (Signed)
cc'ed to pcp °

## 2014-03-06 NOTE — Assessment & Plan Note (Signed)
73 year old male with remote history of adenomatous polyps and +FH of colon cancer in mother, with need for 5-year-surveillance at this time. He has no concerning lower or upper GI symptoms. Appears he had a self-limiting episode of diarrhea with Celexa, which resolved after cessation of drug. Now back to baseline constipation. Will trial Amitiza 8 mcg po BID .   Ultimately needs a colonoscopy, but with recent NSTEMI in May 2015, I would like to have this reviewed with Dr. Domenic Polite first. He is also on Brilinta, which may not be able to be held at this time. Colonoscopy could be performed on anticoagulation, and this was discussed with patient as well. Bleeding risks increased, with possible need for second procedure if large lesion found that was not amenable to polypectomy while on anticoagulation. Procedures on hold until further discussion with cardiology and Dr. Gala Romney.

## 2014-03-06 NOTE — Patient Instructions (Addendum)
I would like you to start a constipation medication called Amitiza. Take this gelcap twice a day with food to avoid nausea. We have provided samples. I have also sent this to the pharmacy.  Please complete the breath test so we can document that the bacteria was successfully treated in the past.   We will be talking with Dr. Domenic Polite about the risks and benefits of proceeding with a colonoscopy right now. You definitely need one this year, but the timing needs to be coordinated first. We will be in touch shortly!

## 2014-03-12 ENCOUNTER — Telehealth: Payer: Self-pay

## 2014-03-12 DIAGNOSIS — A048 Other specified bacterial intestinal infections: Secondary | ICD-10-CM | POA: Diagnosis not present

## 2014-03-12 NOTE — Telephone Encounter (Signed)
Dr. Domenic Polite,   This pt was seen in our office on 03/06/2014 by Laban Emperor, NP.  She would like to get him scheduled in the near furture with Dr. Gala Romney for colonoscopy due to family hx of colon cancer and hx of adenomatous polyps.   Will it be OK to hold Brilinta 2-3 days prior to the procedure?   Please advise!

## 2014-03-12 NOTE — Progress Notes (Signed)
PLEASE NOTE:   As last colonoscopy was in 2011, he actually does not need a colonoscopy until June 2016. THIS IS GOOD, as he recently had a cardiac event and is on Brilinta.   Please let patient know we can postpone the colonoscopy until next year. Let's put him on the list to return in May 2016 to discuss.

## 2014-03-12 NOTE — Telephone Encounter (Signed)
Noted  

## 2014-03-12 NOTE — Telephone Encounter (Signed)
Colonoscopy needs to be deferred until the patient has completed therapy with [facility]

## 2014-03-12 NOTE — Telephone Encounter (Signed)
He is status post NSTEMI with placement of staged DES to the RCA and LAD in late May. We plan a one-year course of DAPT. He should not stop Brilinta this early unless he requires an urgent procedure. If this is a screening colonoscopy, it would be most appropriate to defer until next May.

## 2014-03-12 NOTE — Telephone Encounter (Signed)
Wife made aware colonoscopy will be held till next May

## 2014-03-13 DIAGNOSIS — M7130 Other bursal cyst, unspecified site: Secondary | ICD-10-CM | POA: Diagnosis not present

## 2014-03-13 DIAGNOSIS — B0229 Other postherpetic nervous system involvement: Secondary | ICD-10-CM | POA: Diagnosis not present

## 2014-03-13 DIAGNOSIS — M4310 Spondylolisthesis, site unspecified: Secondary | ICD-10-CM | POA: Diagnosis not present

## 2014-03-13 DIAGNOSIS — M47817 Spondylosis without myelopathy or radiculopathy, lumbosacral region: Secondary | ICD-10-CM | POA: Diagnosis not present

## 2014-03-13 LAB — H. PYLORI BREATH TEST: H. PYLORI BREATH TEST: NOT DETECTED

## 2014-03-14 NOTE — Progress Notes (Signed)
Quick Note:  H. Pylori eradicated.  May resume PPI. ______

## 2014-03-21 ENCOUNTER — Telehealth: Payer: Self-pay | Admitting: Family Medicine

## 2014-03-21 MED ORDER — CANAGLIFLOZIN 300 MG PO TABS: 300.0000 mg | ORAL_TABLET | ORAL | Status: DC

## 2014-03-21 NOTE — Telephone Encounter (Signed)
Rx resent electronically to pharmacy. Patient notified. 

## 2014-03-21 NOTE — Telephone Encounter (Signed)
Patients spouse said that [place] did not have the Canagliflozin (INVOKANA) 300 MG TABS that was to be called in on 02/28/14. Please advise.

## 2014-03-21 NOTE — Progress Notes (Signed)
NIC'D °

## 2014-03-21 NOTE — Progress Notes (Signed)
Pt is aware. Please nic ov in 10/2014

## 2014-03-22 ENCOUNTER — Ambulatory Visit (INDEPENDENT_AMBULATORY_CARE_PROVIDER_SITE_OTHER): Payer: Medicare Other | Admitting: *Deleted

## 2014-03-22 DIAGNOSIS — Z23 Encounter for immunization: Secondary | ICD-10-CM | POA: Diagnosis not present

## 2014-04-17 DIAGNOSIS — H5203 Hypermetropia, bilateral: Secondary | ICD-10-CM | POA: Diagnosis not present

## 2014-04-17 DIAGNOSIS — H52223 Regular astigmatism, bilateral: Secondary | ICD-10-CM | POA: Diagnosis not present

## 2014-04-17 DIAGNOSIS — H524 Presbyopia: Secondary | ICD-10-CM | POA: Diagnosis not present

## 2014-04-17 DIAGNOSIS — H25811 Combined forms of age-related cataract, right eye: Secondary | ICD-10-CM | POA: Diagnosis not present

## 2014-04-17 LAB — HM DIABETES EYE EXAM

## 2014-04-23 ENCOUNTER — Telehealth: Payer: Self-pay | Admitting: Family Medicine

## 2014-04-23 NOTE — Telephone Encounter (Signed)
Patient is currently on insulin glargine (LANTUS) 100 UNIT/ML injection.  He is needing a refill of this, but [facility] will not fill it until more information is provided.  Jacqlyn Larsen is unsure of exactly what they are needing and requesting that we call [facility] and have this taken care of.

## 2014-04-24 NOTE — Telephone Encounter (Signed)
Spoke with [facility] and with patient. He will be able to refill his rx tomorrow. He was trying to fill it too soon. Pt verbalized understanding.

## 2014-04-25 ENCOUNTER — Encounter: Payer: Self-pay | Admitting: Family Medicine

## 2014-05-24 ENCOUNTER — Encounter (HOSPITAL_COMMUNITY): Payer: Self-pay | Admitting: Cardiovascular Disease

## 2014-05-25 ENCOUNTER — Telehealth: Payer: Self-pay | Admitting: Family Medicine

## 2014-05-25 MED ORDER — INSULIN GLARGINE 100 UNIT/ML ~~LOC~~ SOLN: 100.0000 [IU] | Freq: Two times a day (BID) | SUBCUTANEOUS | Status: DC

## 2014-05-25 NOTE — Telephone Encounter (Signed)
Pt was told by pharmacy to call an request an script for his Lantus  We have a hard copy for his chart    Sent back

## 2014-05-25 NOTE — Telephone Encounter (Signed)
Pt and pharmacy notified.

## 2014-05-25 NOTE — Telephone Encounter (Signed)
Patient said that he was told in his last visit on 9/16 that he could increase his lantus to 100 units BID? So that is what he has been doing. He is now out. I do not see anywhere in his chart reflecting 100 units BID?

## 2014-05-25 NOTE — Telephone Encounter (Signed)
He may have a prescription that reflects this dosage. Please please send in and along with 6 months of refills

## 2014-05-31 ENCOUNTER — Ambulatory Visit: Payer: Medicare Other | Admitting: Family Medicine

## 2014-06-01 ENCOUNTER — Ambulatory Visit (INDEPENDENT_AMBULATORY_CARE_PROVIDER_SITE_OTHER): Payer: Medicare Other | Admitting: Family Medicine

## 2014-06-01 ENCOUNTER — Encounter: Payer: Self-pay | Admitting: Family Medicine

## 2014-06-01 VITALS — BP 132/76 | Ht 74.0 in | Wt 274.0 lb

## 2014-06-01 DIAGNOSIS — Z125 Encounter for screening for malignant neoplasm of prostate: Secondary | ICD-10-CM | POA: Diagnosis not present

## 2014-06-01 DIAGNOSIS — I10 Essential (primary) hypertension: Secondary | ICD-10-CM | POA: Diagnosis not present

## 2014-06-01 DIAGNOSIS — I2510 Atherosclerotic heart disease of native coronary artery without angina pectoris: Secondary | ICD-10-CM

## 2014-06-01 DIAGNOSIS — E118 Type 2 diabetes mellitus with unspecified complications: Secondary | ICD-10-CM | POA: Diagnosis not present

## 2014-06-01 DIAGNOSIS — M5441 Lumbago with sciatica, right side: Secondary | ICD-10-CM

## 2014-06-01 DIAGNOSIS — E119 Type 2 diabetes mellitus without complications: Secondary | ICD-10-CM

## 2014-06-01 LAB — POCT GLYCOSYLATED HEMOGLOBIN (HGB A1C): Hemoglobin A1C: 6.8

## 2014-06-01 MED ORDER — OXYCODONE-ACETAMINOPHEN 5-325 MG PO TABS: 1.0000 | ORAL_TABLET | Freq: Four times a day (QID) | ORAL | Status: DC | PRN

## 2014-06-01 MED ORDER — OXYCODONE-ACETAMINOPHEN 10-325 MG PO TABS: 1.0000 | ORAL_TABLET | ORAL | Status: DC | PRN

## 2014-06-01 NOTE — Progress Notes (Signed)
   Subjective:    Patient ID: Francisco Powell, male    DOB: 1941/04/14, 73 y.o.   MRN: [id number]  Diabetes He presents for his follow-up diabetic visit. He has type 2 diabetes mellitus. Hypoglycemia symptoms include confusion. There are no diabetic associated symptoms. Pertinent negatives for diabetes include no chest pain, no fatigue, no polydipsia, no polyphagia and no weakness. Current diabetic treatment includes insulin injections and oral agent (dual therapy). He is compliant with treatment all of the time. His home blood glucose trend is fluctuating dramatically. His highest blood glucose is >200 mg/dl. Eye exam is current.   The patient was seen today as part of a comprehensive diabetic check up. The patient had the following elements completed: -Review of medication compliance -Review of glucose monitoring results -Review of any complications do to high or low sugars -Diabetic foot exam was completed as part of today's visit. The following was also discussed: -Importance of yearly eye exams -Importance of following diabetic/low sugar-starch diet -Importance of exercise and regular activity -Importance of regular followup visits. -Most recent hemoglobin A1c were reviewed with the patient along with goals regarding diabetes.    Review of Systems  Constitutional: Negative for activity change, appetite change and fatigue.  HENT: Negative for congestion.   Respiratory: Negative for cough.   Cardiovascular: Negative for chest pain.  Gastrointestinal: Negative for abdominal pain.  Endocrine: Negative for polydipsia and polyphagia.  Neurological: Negative for weakness.  Psychiatric/Behavioral: Positive for confusion.       Objective:   Physical Exam  Constitutional: He appears well-nourished. No distress.  Cardiovascular: Normal rate, regular rhythm and normal heart sounds.   No murmur heard. Pulmonary/Chest: Effort normal and breath sounds normal. No respiratory distress.    Musculoskeletal: He exhibits no edema.  Lymphadenopathy:    He has no cervical adenopathy.  Neurological: He is alert.  Psychiatric: His behavior is normal.  Vitals reviewed.         Assessment & Plan:  DM - A1C better, doing better with diet, Lantus 10 bid, typical am 110 later around 160  HTN stable  Hyperlip- watch diet use meds follow up labs in spring  Chronic back pain- oxycodone refilled pt deinies overuse denies side effects

## 2014-06-26 DIAGNOSIS — M4316 Spondylolisthesis, lumbar region: Secondary | ICD-10-CM | POA: Diagnosis not present

## 2014-06-26 DIAGNOSIS — M47816 Spondylosis without myelopathy or radiculopathy, lumbar region: Secondary | ICD-10-CM | POA: Diagnosis not present

## 2014-06-26 DIAGNOSIS — B0229 Other postherpetic nervous system involvement: Secondary | ICD-10-CM | POA: Diagnosis not present

## 2014-06-26 DIAGNOSIS — M7138 Other bursal cyst, other site: Secondary | ICD-10-CM | POA: Diagnosis not present

## 2014-08-16 DIAGNOSIS — M7138 Other bursal cyst, other site: Secondary | ICD-10-CM | POA: Diagnosis not present

## 2014-08-16 DIAGNOSIS — M47816 Spondylosis without myelopathy or radiculopathy, lumbar region: Secondary | ICD-10-CM | POA: Diagnosis not present

## 2014-08-16 DIAGNOSIS — M4316 Spondylolisthesis, lumbar region: Secondary | ICD-10-CM | POA: Diagnosis not present

## 2014-08-16 DIAGNOSIS — B0229 Other postherpetic nervous system involvement: Secondary | ICD-10-CM | POA: Diagnosis not present

## 2014-08-21 ENCOUNTER — Telehealth: Payer: Self-pay | Admitting: Family Medicine

## 2014-08-21 DIAGNOSIS — Z79899 Other long term (current) drug therapy: Secondary | ICD-10-CM

## 2014-08-21 DIAGNOSIS — Z125 Encounter for screening for malignant neoplasm of prostate: Secondary | ICD-10-CM

## 2014-08-21 DIAGNOSIS — R972 Elevated prostate specific antigen [PSA]: Secondary | ICD-10-CM

## 2014-08-21 DIAGNOSIS — E119 Type 2 diabetes mellitus without complications: Secondary | ICD-10-CM

## 2014-08-21 DIAGNOSIS — E785 Hyperlipidemia, unspecified: Secondary | ICD-10-CM

## 2014-08-21 NOTE — Telephone Encounter (Signed)
Pt is requesting lab orders to be sent in for his upcoming appt.  Last labs were: bmp,psa,poct 06/01/14

## 2014-08-21 NOTE — Telephone Encounter (Signed)
Patient did not do the last lab work so therefore looks order all at one time including metabolic 7, PSA, lipid, liver, hemoglobin A1c

## 2014-08-22 NOTE — Telephone Encounter (Signed)
bloodwork orders ready pt notified.

## 2014-08-28 DIAGNOSIS — Z79899 Other long term (current) drug therapy: Secondary | ICD-10-CM | POA: Diagnosis not present

## 2014-08-28 DIAGNOSIS — Z125 Encounter for screening for malignant neoplasm of prostate: Secondary | ICD-10-CM | POA: Diagnosis not present

## 2014-08-28 DIAGNOSIS — E785 Hyperlipidemia, unspecified: Secondary | ICD-10-CM | POA: Diagnosis not present

## 2014-08-28 DIAGNOSIS — E119 Type 2 diabetes mellitus without complications: Secondary | ICD-10-CM | POA: Diagnosis not present

## 2014-08-29 LAB — HEPATIC FUNCTION PANEL
ALT: 35 IU/L (ref 0–44)
AST: 39 IU/L (ref 0–40)
Albumin: 4.3 g/dL (ref 3.5–4.8)
Alkaline Phosphatase: 57 IU/L (ref 39–117)
Bilirubin Total: 0.4 mg/dL (ref 0.0–1.2)
Bilirubin, Direct: 0.14 mg/dL (ref 0.00–0.40)
Total Protein: 6.8 g/dL (ref 6.0–8.5)

## 2014-08-29 LAB — LIPID PANEL
Chol/HDL Ratio: 3.6 ratio units (ref 0.0–5.0)
Cholesterol, Total: 174 mg/dL (ref 100–199)
HDL: 48 mg/dL (ref >39)
LDL CALC: 68 mg/dL (ref 0–99)
TRIGLYCERIDES: 289 mg/dL — AB (ref 0–149)
VLDL CHOLESTEROL CAL: 58 mg/dL — AB (ref 5–40)

## 2014-08-29 LAB — BASIC METABOLIC PANEL
BUN / CREAT RATIO: 19 (ref 10–22)
BUN: 18 mg/dL (ref 8–27)
CALCIUM: 9.6 mg/dL (ref 8.6–10.2)
CO2: 25 mmol/L (ref 18–29)
CREATININE: 0.93 mg/dL (ref 0.76–1.27)
Chloride: 103 mmol/L (ref 97–108)
GFR calc Af Amer: 95 mL/min/{1.73_m2} (ref >59)
GFR calc non Af Amer: 82 mL/min/{1.73_m2} (ref >59)
Glucose: 74 mg/dL (ref 65–99)
Potassium: 4 mmol/L (ref 3.5–5.2)
Sodium: 146 mmol/L — ABNORMAL HIGH (ref 134–144)

## 2014-08-29 LAB — HEMOGLOBIN A1C
Est. average glucose Bld gHb Est-mCnc: 177 mg/dL
HEMOGLOBIN A1C: 7.8 % — AB (ref 4.8–5.6)

## 2014-08-29 LAB — PSA: PSA: 4.1 ng/mL — ABNORMAL HIGH (ref 0.0–4.0)

## 2014-08-29 NOTE — Addendum Note (Signed)
Addended byCharolotte Capuchin D on: 08/29/2014 02:09 PM   Modules accepted: Orders

## 2014-08-29 NOTE — Telephone Encounter (Signed)
Patient's wife notified and verbalized understanding of the test results. Has f/u appt on the 22nd.

## 2014-08-30 DIAGNOSIS — R972 Elevated prostate specific antigen [PSA]: Secondary | ICD-10-CM | POA: Diagnosis not present

## 2014-08-31 LAB — PSA, TOTAL AND FREE
PSA FREE: 0.91 ng/mL
PSA, Free Pct: 22.2 %
PSA: 4.1 ng/mL — ABNORMAL HIGH (ref 0.0–4.0)

## 2014-09-03 ENCOUNTER — Encounter: Payer: Self-pay | Admitting: Cardiology

## 2014-09-03 ENCOUNTER — Ambulatory Visit (INDEPENDENT_AMBULATORY_CARE_PROVIDER_SITE_OTHER): Payer: Medicare Other | Admitting: Cardiology

## 2014-09-03 VITALS — BP 128/68 | HR 65

## 2014-09-03 DIAGNOSIS — I10 Essential (primary) hypertension: Secondary | ICD-10-CM | POA: Diagnosis not present

## 2014-09-03 DIAGNOSIS — I4510 Unspecified right bundle-branch block: Secondary | ICD-10-CM

## 2014-09-03 DIAGNOSIS — E118 Type 2 diabetes mellitus with unspecified complications: Secondary | ICD-10-CM | POA: Diagnosis not present

## 2014-09-03 DIAGNOSIS — E782 Mixed hyperlipidemia: Secondary | ICD-10-CM

## 2014-09-03 DIAGNOSIS — I2510 Atherosclerotic heart disease of native coronary artery without angina pectoris: Secondary | ICD-10-CM | POA: Diagnosis not present

## 2014-09-03 NOTE — Progress Notes (Signed)
Cardiology Office Note  Date: 09/03/2014   ID: Francisco Powell, Francisco Powell 1940-09-24, MRN [id number]  PCP: Sallee Lange, MD  Primary Cardiologist: Rozann Lesches, MD   Chief Complaint  Patient presents with  . Coronary Artery Disease  . Hypertension  . Hyperlipidemia    History of Present Illness: Francisco Powell is a 74 y.o. male last seen in September 2015. He is here with his wife today for a follow-up visit. Remains stable from a cardiac perspective without active angina symptoms. His medicines are reviewed below, we discussed continuing DAPT without interruption through May of this year. At this point he is deferring a colonoscopy due to this, and it sounds like he is also undergoing evaluation of chronic lower back pain with Dr. [name].  He continues to follow with Dr. Wolfgang Phoenix and had recent lab work, visit scheduled for tomorrow. LDL has been well controlled on Pravachol.  Follow-up tracing is noted below.   Past Medical History  Diagnosis Date  . Coronary atherosclerosis of native coronary artery     DES RCA and staged DES LAD May 2015, LVEF 55%  . Hyperlipidemia   . Essential hypertension, benign   . Obesity   . Lumbar back pain     Spondylosis and spondylolisthesis   . Nephrolithiasis   . Asbestosis(501)   . Anemia, iron deficiency   . Hx of adenomatous colonic polyps   . Sciatica of right side   . Myocardial infarction 2004  . Type II diabetes mellitus   . Gout   . Anxiety   . Kidney carcinoma 1998  . NSTEMI (non-ST elevated myocardial infarction)     May 2015    Past Surgical History  Procedure Laterality Date  . Holmium laser application Right 3235  . Cholecystectomy      Biliary dyskinesia  . Retinal detachment surgery Right 1998    Secondary to injury detached retina/cataracts  . Cystoscopy w/ litholapaxy / ehl  2009  . Appendectomy    . Tonsillectomy    . Colonoscopy  2009    Dr. Arnoldo Morale: normal  . Back surgery  X 4  . Lumbar disc surgery  X 3    . Posterior lumbar fusion  2014  . Eye surgery    . Tumor excision  1995  . Esophagogastroduodenoscopy  June 2011    Dr. Gala Romney: erosive reflux esophagitis, small hiatal hernia, +H.pylori gastritis  . Colonoscopy  June 2011    Dr. Gala Romney: normal rectum, left-sided diverticula  . Left heart catheterization with coronary angiogram N/A 11/09/2013    Procedure: LEFT HEART CATHETERIZATION WITH CORONARY ANGIOGRAM;  Surgeon: [name], MD;  Location: [facility] CATH LAB;  Service: Cardiovascular;  Laterality: N/A;  . Percutaneous coronary stent intervention (pci-s) N/A 11/10/2013    Procedure: PERCUTANEOUS CORONARY STENT INTERVENTION (PCI-S);  Surgeon: [name], MD;  Location: [facility] CATH LAB;  Service: Cardiovascular;  Laterality: N/A;    Current Outpatient Prescriptions  Medication Sig Dispense Refill  . amLODipine (NORVASC) 10 MG tablet Take 1 tablet (10 mg total) by mouth daily. 90 tablet 2  . aspirin 81 MG tablet Take 81 mg by mouth daily.      . Canagliflozin (INVOKANA) 300 MG TABS Take 1 tablet (300 mg total) by mouth See admin instructions. 30 tablet 5  . carvedilol (COREG) 12.5 MG tablet Take 1 tablet (12.5 mg total) by mouth 2 (two) times daily with a meal. 60 tablet 5  . insulin aspart (NOVOLOG FLEXPEN) 100  UNIT/ML FlexPen Inject 25 Units into the skin 2 (two) times daily. 15 mL 11  . insulin glargine (LANTUS) 100 UNIT/ML injection Inject 1 mL (100 Units total) into the skin 2 (two) times daily. (Patient taking differently: Inject 80 Units into the skin 2 (two) times daily. ) 10 mL 5  . lisinopril (PRINIVIL,ZESTRIL) 40 MG tablet Take 1 tablet (40 mg total) by mouth daily. 90 tablet 1  . metFORMIN (GLUCOPHAGE) 1000 MG tablet Take 1 tablet (1,000 mg total) by mouth 2 (two) times daily with a meal. 180 tablet 2  . nitroGLYCERIN (NITROSTAT) 0.4 MG SL tablet Place 1 tablet (0.4 mg total) under the tongue every 5 (five) minutes x 3 doses as needed for chest pain. 25 tablet 4  .  oxyCODONE-acetaminophen (ROXICET) 5-325 MG per tablet Take 1 tablet by mouth every 6 (six) hours as needed for severe pain. 90 tablet 0  . pravastatin (PRAVACHOL) 40 MG tablet Take 1 tablet (40 mg total) by mouth every evening. 90 tablet 1  . ticagrelor (BRILINTA) 90 MG TABS tablet Take 1 tablet (90 mg total) by mouth 2 (two) times daily. 60 tablet 11   No current facility-administered medications for this visit.    Allergies:  Penicillins; Atorvastatin; Other; and Celexa   Social History: The patient  reports that he has never smoked. His smokeless tobacco use includes Chew. He reports that he does not drink alcohol or use illicit drugs.    ROS:  Please see the history of present illness. Otherwise, complete review of systems is positive for chronic back pain.  All other systems are reviewed and negative.    Physical Exam: VS:  BP 128/68 mmHg  Pulse 65  SpO2 96%, BMI There is no weight on file to calculate BMI.  Wt Readings from Last 3 Encounters:  06/01/14 274 lb (124.286 kg)  03/06/14 283 lb 3.2 oz (128.459 kg)  02/28/14 281 lb (127.461 kg)     Obese male, no distress.  HEENT: Conjunctiva and lids normal, oropharynx clear.  Neck: Supple, no elevated JVP, increased girth, no thyromegaly.  Lungs: Clear to auscultation, nonlabored breathing at rest.  Cardiac: Regular rate and rhythm, no S3 or significant systolic murmur, no pericardial rub.  Abdomen: Soft, nontender, bowel sounds present, no guarding or rebound.  Extremities: No pitting edema, distal pulses 2+.  Skin: Warm and dry.  Musculoskeletal: No kyphosis.  Neuropsychiatric: Alert and oriented x3, affect grossly appropriate.   ECG: ECG is ordered today showing sinus bradycardia with right bundle branch block and left anterior fascicular block.  Recent Labwork: 11/09/2013: Magnesium 1.8; Pro B Natriuretic peptide (BNP) 156.9*; TSH 3.190 11/11/2013: Hemoglobin 13.0; Platelets 241 08/28/2014: ALT 35; AST 39; BUN 18;  Creatinine 0.93; Potassium 4.0; Sodium 146*     Component Value Date/Time   CHOL 174 08/28/2014 0852   CHOL 154 02/21/2014 0745   TRIG 289* 08/28/2014 0852   HDL 48 08/28/2014 0852   HDL 34* 02/21/2014 0745   CHOLHDL 3.6 08/28/2014 0852   CHOLHDL 4.5 02/21/2014 0745   VLDL 55* 02/21/2014 0745   LDLCALC 68 08/28/2014 0852   LDLCALC 65 02/21/2014 0745    ASSESSMENT AND PLAN:  1. Symptomatic stable with CAD status post DES to the RCA and LAD in May 2015, LVEF normal range. Plan is to continue uninterrupted DAPT through May of this year. I will see him back around that time to discuss his symptoms.  2. Essential hypertension, blood pressure well controlled today.  3. Hyperlipidemia,  on statin therapy, recent LDL 68.  4. Type 2 diabetes mellitus, followed by Dr. Wolfgang Phoenix.   Current medicines are reviewed at length with the patient today.    Orders Placed This Encounter  Procedures  . EKG 12-Lead    Disposition: FU with me in June.  Signed, [name], MD, [facility] 09/03/2014 4:01 PM    [facility] at [facility] [address], [place], [place] [address] Phone: [phone]; Fax: [phone]

## 2014-09-03 NOTE — Patient Instructions (Signed)
Your physician recommends that you schedule a follow-up appointment in: June with Dr Domenic Polite   Your physician recommends that you continue on your current medications as directed. Please refer to the Current Medication list given to you today.     Thank you for choosing [facility] !

## 2014-09-04 ENCOUNTER — Ambulatory Visit (INDEPENDENT_AMBULATORY_CARE_PROVIDER_SITE_OTHER): Payer: Medicare Other | Admitting: Family Medicine

## 2014-09-04 ENCOUNTER — Encounter: Payer: Self-pay | Admitting: Family Medicine

## 2014-09-04 VITALS — BP 144/72 | Ht 74.0 in | Wt 280.0 lb

## 2014-09-04 DIAGNOSIS — R972 Elevated prostate specific antigen [PSA]: Secondary | ICD-10-CM | POA: Diagnosis not present

## 2014-09-04 DIAGNOSIS — IMO0002 Reserved for concepts with insufficient information to code with codable children: Secondary | ICD-10-CM

## 2014-09-04 DIAGNOSIS — I2510 Atherosclerotic heart disease of native coronary artery without angina pectoris: Secondary | ICD-10-CM | POA: Diagnosis not present

## 2014-09-04 DIAGNOSIS — I10 Essential (primary) hypertension: Secondary | ICD-10-CM | POA: Diagnosis not present

## 2014-09-04 DIAGNOSIS — E1165 Type 2 diabetes mellitus with hyperglycemia: Secondary | ICD-10-CM | POA: Diagnosis not present

## 2014-09-04 DIAGNOSIS — D369 Benign neoplasm, unspecified site: Secondary | ICD-10-CM | POA: Diagnosis not present

## 2014-09-04 DIAGNOSIS — E785 Hyperlipidemia, unspecified: Secondary | ICD-10-CM | POA: Diagnosis not present

## 2014-09-04 MED ORDER — METFORMIN HCL 1000 MG PO TABS: 1000.0000 mg | ORAL_TABLET | Freq: Two times a day (BID) | ORAL | Status: DC

## 2014-09-04 MED ORDER — PRAVASTATIN SODIUM 40 MG PO TABS: 40.0000 mg | ORAL_TABLET | Freq: Every evening | ORAL | Status: DC

## 2014-09-04 MED ORDER — CARVEDILOL 12.5 MG PO TABS: 12.5000 mg | ORAL_TABLET | Freq: Two times a day (BID) | ORAL | Status: DC

## 2014-09-04 MED ORDER — AMLODIPINE BESYLATE 10 MG PO TABS: 10.0000 mg | ORAL_TABLET | Freq: Every day | ORAL | Status: DC

## 2014-09-04 MED ORDER — CANAGLIFLOZIN 300 MG PO TABS: 300.0000 mg | ORAL_TABLET | ORAL | Status: DC

## 2014-09-04 NOTE — Addendum Note (Signed)
Addended by: Sallee Lange A on: 09/04/2014 09:52 AM   Modules accepted: Level of Service

## 2014-09-04 NOTE — Patient Instructions (Signed)
Dear Patient,  It has been recommended to you that you have a colonoscopy. It is your responsibility to carry through with this recommendation.   Did you realize that colon cancer is the second leading cancer killer in the [place]. One in every 20 adults will get colon cancer. If all adults would go through the recommended screening for colon cancer (getting a colonoscopy), then there would be a 60% reduction in the number of people dying from colon cancer.  Colon cancer just doesn't come out of the blue. It starts off as a small polyp which over time grows into a cancer. A colonoscopy can prevent cancer and in many cases detected when it is at a very treatable phase. Small colon cancers can have cure rates of 95%. Advanced colon cancer, which often occurs in people who do not do their screenings, have cure rates less than 20%. The risk of colon cancer advances with age. Most adults should have regular colonoscopies every 10 years starting at age 50. This recommendation can vary depending on a person's medical history.  Health-care laws now allow for you to call the gastroenterologist office directly in order to set yourself up for this very important tests. Today we have recommended to you that you do this test. This test may save your life. Failure to do this test puts you at risk for premature death from colon cancer. Do the right thing and schedule this test now.  Here as a list of specialists we recommend in the surrounding area. When you call their office let them know that you are a patient of our practice in your interested in doing a screening colonoscopy. They should assist you without problems. You will need the following information when you called them: 1-name of which Dr. you see, 2-your insurance information, 3-a list of medications that you currently take, 4-any allergies you have to medications.  [facility] Dr. Mike Rourk, Dr Sandi Fields   [place]  gastroenterologist   [phone]  Dr.Najeeb Rehman  [facility]   [phone]  [facility] (Dr. Perry, N, Stark, Brodie, Gesner, Jacobs and Pyrtle) [phone]  [facility] (Dr. Buscemi, Edwards, Hayes, [place],Outlaw,Schooler) [phone]  Each group of specialists has assured us that when you called them they will help you get your colonoscopy set up. Should you have problems please let us know. Be sure to call soon. Sincerely, Carolyn Hoskins, Dr Steve Lacey Wallman, Dr.Braxen Dobek    

## 2014-09-04 NOTE — Progress Notes (Addendum)
Subjective:    Patient ID: Francisco Powell, male    DOB: 02/19/1941, 74 y.o.   MRN: [id number]  Diabetes He presents for his follow-up diabetic visit. He has type 2 diabetes mellitus. Pertinent negatives for hypoglycemia include no confusion. Pertinent negatives for diabetes include no chest pain, no fatigue, no polydipsia, no polyphagia and no weakness. Current diabetic treatment includes insulin injections and oral agent (monotherapy). He is compliant with treatment all of the time. He does not see a podiatrist.Eye exam is current.  A1C done on bloodwork 08/28/14. 7.8. Pt states he was on prednisone for back pain and his blood sugars have been running high.   Elevated psa on bloodwork.  The patient was seen today as part of a comprehensive diabetic check up. The patient had the following elements completed: -Review of medication compliance -Review of glucose monitoring results -Review of any complications do to high or low sugars -Diabetic foot exam was completed as part of today's visit. The following was also discussed: -Importance of yearly eye exams -Importance of following diabetic/low sugar-starch diet -Importance of exercise and regular activity -Importance of regular followup visits. -Most recent hemoglobin A1c were reviewed with the patient along with goals regarding diabetes.  Patient denies hematuria rectal bleeding. He knows he needs to get a colonoscopy. He states pain is moderate to severe uses oxycodone intermittently 5 mg. States 10 mg to strong he is going to be following up with Dr. [name] in the near future  Recently consult cardiologist this note reviewed with the patient he understands to stay on his medications currently. Cholesterol is an issue for the patient recent lab work looks good continue current medication 25 minutes spent with the patient reviewing over multiple different issues. Greater than half spent in discussion Review of Systems  Constitutional:  Negative for activity change, appetite change and fatigue.  HENT: Negative for congestion.   Respiratory: Negative for cough.   Cardiovascular: Negative for chest pain.  Gastrointestinal: Negative for abdominal pain.  Endocrine: Negative for polydipsia and polyphagia.  Genitourinary: Negative for frequency and hematuria.  Neurological: Negative for weakness and numbness.  Psychiatric/Behavioral: Negative for confusion.       Objective:   Physical Exam  Constitutional: He appears well-nourished. No distress.  Cardiovascular: Normal rate, regular rhythm and normal heart sounds.   No murmur heard. Pulmonary/Chest: Effort normal and breath sounds normal. No respiratory distress.  Genitourinary: Rectum normal and prostate normal.  Musculoskeletal: He exhibits no edema.  Lymphadenopathy:    He has no cervical adenopathy.  Neurological: He is alert.  Psychiatric: His behavior is normal.  Vitals reviewed.         Assessment & Plan:  1. Essential hypertension Blood pressure under decent control watch salt try to stay active  2. Adenomatous polyps This patient is due for colonoscopy in June. He has to stay on his heart medication through may but after that he may stop that medicine which will allow him to get his colonoscopy. Patient has family history. He is due. Message was sent to GI doctor. Patient stated that he would be calling their office soon to set up colonoscopy in June. 3. Hyperlipidemia Cholesterol control good continue current measures  4. Diabetes mellitus type II, uncontrolled Diabetes subpar we discussed the importance of diet is possible the prednisone he was on recently through this off recheck A1c in July patient is a send [place] some readings in a few weeks time we will make adjustments based upon those readings.  Continue medication as prescribed.  5. Elevated PSA PSA slightly elevated at 4.1 prostate exam normal. Patient currently is undergoing evaluation for the  possibility of sciatica possible surgery I don't recommend urology referral at this point we will repeat PSA in 3-4 months time if still elevated then referral to urology. The chance of this being prostate cancer is low. Obviously a PSA continues go up they will need to do biopsy

## 2014-09-06 ENCOUNTER — Encounter: Payer: Self-pay | Admitting: Internal Medicine

## 2014-09-13 DIAGNOSIS — M47816 Spondylosis without myelopathy or radiculopathy, lumbar region: Secondary | ICD-10-CM | POA: Diagnosis not present

## 2014-09-13 DIAGNOSIS — M7138 Other bursal cyst, other site: Secondary | ICD-10-CM | POA: Diagnosis not present

## 2014-09-13 DIAGNOSIS — M4316 Spondylolisthesis, lumbar region: Secondary | ICD-10-CM | POA: Diagnosis not present

## 2014-09-13 DIAGNOSIS — B0229 Other postherpetic nervous system involvement: Secondary | ICD-10-CM | POA: Diagnosis not present

## 2014-09-24 ENCOUNTER — Encounter: Payer: Self-pay | Admitting: Internal Medicine

## 2014-09-24 ENCOUNTER — Telehealth: Payer: Self-pay | Admitting: Gastroenterology

## 2014-09-24 DIAGNOSIS — M25551 Pain in right hip: Secondary | ICD-10-CM | POA: Diagnosis not present

## 2014-09-24 DIAGNOSIS — N400 Enlarged prostate without lower urinary tract symptoms: Secondary | ICD-10-CM | POA: Diagnosis not present

## 2014-09-24 DIAGNOSIS — Z981 Arthrodesis status: Secondary | ICD-10-CM | POA: Diagnosis not present

## 2014-09-24 NOTE — Telephone Encounter (Signed)
Let's make sure patient has an appt to see [place] in May 2016 to arrange colonoscopy.

## 2014-09-24 NOTE — Telephone Encounter (Signed)
APPOINTMENT MADE AND LETTER SENT °

## 2014-10-09 DIAGNOSIS — B0229 Other postherpetic nervous system involvement: Secondary | ICD-10-CM | POA: Diagnosis not present

## 2014-10-09 DIAGNOSIS — M47816 Spondylosis without myelopathy or radiculopathy, lumbar region: Secondary | ICD-10-CM | POA: Diagnosis not present

## 2014-10-09 DIAGNOSIS — M4316 Spondylolisthesis, lumbar region: Secondary | ICD-10-CM | POA: Diagnosis not present

## 2014-10-24 ENCOUNTER — Ambulatory Visit: Payer: No Typology Code available for payment source | Admitting: Gastroenterology

## 2014-10-25 ENCOUNTER — Encounter: Payer: Self-pay | Admitting: Internal Medicine

## 2014-11-15 ENCOUNTER — Encounter: Payer: Self-pay | Admitting: Cardiology

## 2014-11-15 ENCOUNTER — Ambulatory Visit (INDEPENDENT_AMBULATORY_CARE_PROVIDER_SITE_OTHER): Payer: Medicare Other | Admitting: Cardiology

## 2014-11-15 VITALS — BP 142/62 | HR 66 | Wt 287.0 lb

## 2014-11-15 DIAGNOSIS — E782 Mixed hyperlipidemia: Secondary | ICD-10-CM | POA: Diagnosis not present

## 2014-11-15 DIAGNOSIS — I10 Essential (primary) hypertension: Secondary | ICD-10-CM

## 2014-11-15 DIAGNOSIS — I2510 Atherosclerotic heart disease of native coronary artery without angina pectoris: Secondary | ICD-10-CM

## 2014-11-15 MED ORDER — NITROGLYCERIN 0.4 MG SL SUBL: 0.4000 mg | SUBLINGUAL_TABLET | SUBLINGUAL | Status: DC | PRN

## 2014-11-15 NOTE — Patient Instructions (Signed)
Your physician recommends that you schedule a follow-up appointment in:  4 months  Stop BRILINTA   REFILLED [employer]   Thanks for choosing [facility]!!!

## 2014-11-15 NOTE — Progress Notes (Signed)
Cardiology Office Note  Date: 11/15/2014   ID: Hartley, Urton 1940/07/18, MRN [id number]  PCP: Sallee Lange, MD  Primary Cardiologist: Rozann Lesches, MD   Chief Complaint  Patient presents with  . Coronary Artery Disease  . Hypertension  . Hyperlipidemia    History of Present Illness: Francisco Powell is a 74 y.o. male last seen in March 2016. He is here with his wife today for a routine follow-up visit. From a cardiac perspective, he does not endorse any angina symptoms or nitroglycerin use. His main complaint is of worsening right hip and lower back pain. He is being evaluated by Dr. [name], it sounds like he may be referred to Dr. Francesco Runner for pain management.  As of this May, he has completed one year on DAPT following his DES interventions to the RCA and LAD in May of last year. We discussed stopping Brilinta at this time.  Most recent follow-up lipid panel is outlined below. He continues on Pravachol.   Past Medical History  Diagnosis Date  . Coronary atherosclerosis of native coronary artery     DES RCA and staged DES LAD May 2015, LVEF 55%  . Hyperlipidemia   . Essential hypertension, benign   . Obesity   . Lumbar back pain     Spondylosis and spondylolisthesis   . Nephrolithiasis   . Asbestosis(501)   . Anemia, iron deficiency   . Hx of adenomatous colonic polyps   . Sciatica of right side   . Myocardial infarction 2004  . Type II diabetes mellitus   . Gout   . Anxiety   . Kidney carcinoma 1998  . NSTEMI (non-ST elevated myocardial infarction)     May 2015    Current Outpatient Prescriptions  Medication Sig Dispense Refill  . amLODipine (NORVASC) 10 MG tablet Take 1 tablet (10 mg total) by mouth daily. 90 tablet 2  . aspirin 81 MG tablet Take 81 mg by mouth daily.      . canagliflozin (INVOKANA) 300 MG TABS tablet Take 300 mg by mouth See admin instructions. 30 tablet 5  . carvedilol (COREG) 12.5 MG tablet Take 1 tablet (12.5 mg total) by mouth 2  (two) times daily with a meal. 60 tablet 5  . insulin aspart (NOVOLOG FLEXPEN) 100 UNIT/ML FlexPen Inject 25 Units into the skin 2 (two) times daily. 15 mL 11  . insulin glargine (LANTUS) 100 UNIT/ML injection Inject 1 mL (100 Units total) into the skin 2 (two) times daily. (Patient taking differently: Inject 80 Units into the skin 2 (two) times daily. ) 10 mL 5  . lisinopril (PRINIVIL,ZESTRIL) 40 MG tablet Take 1 tablet (40 mg total) by mouth daily. 90 tablet 1  . metFORMIN (GLUCOPHAGE) 1000 MG tablet Take 1 tablet (1,000 mg total) by mouth 2 (two) times daily with a meal. 180 tablet 2  . nitroGLYCERIN (NITROSTAT) 0.4 MG SL tablet Place 1 tablet (0.4 mg total) under the tongue every 5 (five) minutes x 3 doses as needed for chest pain. 25 tablet 4  . oxyCODONE-acetaminophen (ROXICET) 5-325 MG per tablet Take 1 tablet by mouth every 6 (six) hours as needed for severe pain. 90 tablet 0  . pravastatin (PRAVACHOL) 40 MG tablet Take 1 tablet (40 mg total) by mouth every evening. 90 tablet 3   No current facility-administered medications for this visit.    Allergies:  Penicillins; Atorvastatin; Other; and Celexa   Social History: The patient  reports that he has never smoked.  His smokeless tobacco use includes Chew. He reports that he does not drink alcohol or use illicit drugs.   ROS:  Please see the history of present illness. Otherwise, complete review of systems is positive for chronic back pain.  All other systems are reviewed and negative.   Physical Exam: VS:  BP 142/62 mmHg  Pulse 66  Wt 287 lb (130.182 kg)  SpO2 95%, BMI Body mass index is 36.83 kg/(m^2).  Wt Readings from Last 3 Encounters:  11/15/14 287 lb (130.182 kg)  09/04/14 280 lb (127.007 kg)  06/01/14 274 lb (124.286 kg)     Obese male, no distress.  HEENT: Conjunctiva and lids normal, oropharynx clear.  Neck: Supple, no elevated JVP, increased girth, no thyromegaly.  Lungs: Clear to auscultation, nonlabored breathing  at rest.  Cardiac: Regular rate and rhythm, no S3 or significant systolic murmur, no pericardial rub.  Abdomen: Soft, nontender, bowel sounds present, no guarding or rebound.  Extremities: No pitting edema, distal pulses 2+.  Skin: Warm and dry.  Musculoskeletal: No kyphosis.  Neuropsychiatric: Alert and oriented x3, affect grossly appropriate.   ECG: ECG is not ordered today.  Recent Labwork: 08/28/2014: ALT 35; AST 39; BUN 18; Creatinine 0.93; Potassium 4.0; Sodium 146*     Component Value Date/Time   CHOL 174 08/28/2014 0852   CHOL 154 02/21/2014 0745   TRIG 289* 08/28/2014 0852   HDL 48 08/28/2014 0852   HDL 34* 02/21/2014 0745   CHOLHDL 3.6 08/28/2014 0852   CHOLHDL 4.5 02/21/2014 0745   VLDL 55* 02/21/2014 0745   LDLCALC 68 08/28/2014 0852   LDLCALC 65 02/21/2014 0745    ASSESSMENT AND PLAN:  1. Symptomatically stable CAD status post DES interventions to the RCA and LAD in May of last year. He will now stop Brilinta, otherwise continue his current regimen.  2. Hyperlipidemia, recent LDL 68 on Pravachol.  3. Essential hypertension, no change in current regimen.  4. Chronic lower back pain with sciatica, progressive symptoms. Patient states that he has pending evaluation by Dr. Francesco Runner for possible injections for pain control. He will now be off Brilinta, and it should also be reasonable for him to temporarily hold aspirin if needed.  Current medicines were reviewed at length with the patient today.   Disposition: FU with me in 4 months.   Signed, [name], MD, [facility] 11/15/2014 9:59 AM    [facility] at [facility] [address], [place], [place] [address] Phone: [phone]; Fax: [phone]

## 2014-11-19 ENCOUNTER — Ambulatory Visit: Payer: Medicare Other | Admitting: Cardiology

## 2014-11-23 DIAGNOSIS — M461 Sacroiliitis, not elsewhere classified: Secondary | ICD-10-CM | POA: Diagnosis not present

## 2014-11-23 DIAGNOSIS — M47816 Spondylosis without myelopathy or radiculopathy, lumbar region: Secondary | ICD-10-CM | POA: Diagnosis not present

## 2014-11-26 ENCOUNTER — Other Ambulatory Visit: Payer: Self-pay | Admitting: Family Medicine

## 2014-11-27 DIAGNOSIS — Z8249 Family history of ischemic heart disease and other diseases of the circulatory system: Secondary | ICD-10-CM | POA: Diagnosis not present

## 2014-11-27 DIAGNOSIS — Z6836 Body mass index (BMI) 36.0-36.9, adult: Secondary | ICD-10-CM | POA: Diagnosis not present

## 2014-11-27 DIAGNOSIS — E780 Pure hypercholesterolemia: Secondary | ICD-10-CM | POA: Diagnosis not present

## 2014-11-27 DIAGNOSIS — Z809 Family history of malignant neoplasm, unspecified: Secondary | ICD-10-CM | POA: Diagnosis not present

## 2014-11-27 DIAGNOSIS — M461 Sacroiliitis, not elsewhere classified: Secondary | ICD-10-CM | POA: Diagnosis not present

## 2014-11-27 DIAGNOSIS — Z794 Long term (current) use of insulin: Secondary | ICD-10-CM | POA: Diagnosis not present

## 2014-11-27 DIAGNOSIS — Z87442 Personal history of urinary calculi: Secondary | ICD-10-CM | POA: Diagnosis not present

## 2014-11-27 DIAGNOSIS — G8929 Other chronic pain: Secondary | ICD-10-CM | POA: Diagnosis not present

## 2014-11-27 DIAGNOSIS — Z833 Family history of diabetes mellitus: Secondary | ICD-10-CM | POA: Diagnosis not present

## 2014-11-27 DIAGNOSIS — Z888 Allergy status to other drugs, medicaments and biological substances status: Secondary | ICD-10-CM | POA: Diagnosis not present

## 2014-11-27 DIAGNOSIS — M47816 Spondylosis without myelopathy or radiculopathy, lumbar region: Secondary | ICD-10-CM | POA: Diagnosis not present

## 2014-11-27 DIAGNOSIS — Z86010 Personal history of colonic polyps: Secondary | ICD-10-CM | POA: Diagnosis not present

## 2014-11-27 DIAGNOSIS — E119 Type 2 diabetes mellitus without complications: Secondary | ICD-10-CM | POA: Diagnosis not present

## 2014-11-27 DIAGNOSIS — Z79899 Other long term (current) drug therapy: Secondary | ICD-10-CM | POA: Diagnosis not present

## 2014-11-27 DIAGNOSIS — Z981 Arthrodesis status: Secondary | ICD-10-CM | POA: Diagnosis not present

## 2014-11-27 DIAGNOSIS — E669 Obesity, unspecified: Secondary | ICD-10-CM | POA: Diagnosis not present

## 2014-11-27 DIAGNOSIS — Z7982 Long term (current) use of aspirin: Secondary | ICD-10-CM | POA: Diagnosis not present

## 2014-11-28 ENCOUNTER — Ambulatory Visit: Payer: Medicare Other | Admitting: Cardiology

## 2014-12-19 ENCOUNTER — Telehealth: Payer: Self-pay | Admitting: Family Medicine

## 2014-12-19 DIAGNOSIS — I10 Essential (primary) hypertension: Secondary | ICD-10-CM

## 2014-12-19 DIAGNOSIS — Z79899 Other long term (current) drug therapy: Secondary | ICD-10-CM

## 2014-12-19 DIAGNOSIS — E1165 Type 2 diabetes mellitus with hyperglycemia: Secondary | ICD-10-CM

## 2014-12-19 DIAGNOSIS — R972 Elevated prostate specific antigen [PSA]: Secondary | ICD-10-CM

## 2014-12-19 DIAGNOSIS — E785 Hyperlipidemia, unspecified: Secondary | ICD-10-CM

## 2014-12-19 DIAGNOSIS — IMO0002 Reserved for concepts with insufficient information to code with codable children: Secondary | ICD-10-CM

## 2014-12-19 NOTE — Telephone Encounter (Signed)
Repeat all of these lab work including PSA with the diagnosis of diabetes, hyperlipidemia, hypertension, elevated PSA

## 2014-12-19 NOTE — Telephone Encounter (Signed)
Calling to get order for Bw for next Tuesdays appointment.

## 2014-12-19 NOTE — Telephone Encounter (Signed)
Blood work orders placed in [employer]. Patient notified.

## 2014-12-19 NOTE — Telephone Encounter (Addendum)
08/28/14 patient had HgbA1c, PSA, Liver, Met 7, Lipid

## 2014-12-20 DIAGNOSIS — R972 Elevated prostate specific antigen [PSA]: Secondary | ICD-10-CM | POA: Diagnosis not present

## 2014-12-20 DIAGNOSIS — E785 Hyperlipidemia, unspecified: Secondary | ICD-10-CM | POA: Diagnosis not present

## 2014-12-20 DIAGNOSIS — Z79899 Other long term (current) drug therapy: Secondary | ICD-10-CM | POA: Diagnosis not present

## 2014-12-20 DIAGNOSIS — I10 Essential (primary) hypertension: Secondary | ICD-10-CM | POA: Diagnosis not present

## 2014-12-20 DIAGNOSIS — E1165 Type 2 diabetes mellitus with hyperglycemia: Secondary | ICD-10-CM | POA: Diagnosis not present

## 2014-12-21 LAB — BASIC METABOLIC PANEL
BUN/Creatinine Ratio: 14 (ref 10–22)
BUN: 14 mg/dL (ref 8–27)
CALCIUM: 9.5 mg/dL (ref 8.6–10.2)
CO2: 21 mmol/L (ref 18–29)
Chloride: 105 mmol/L (ref 97–108)
Creatinine, Ser: 0.98 mg/dL (ref 0.76–1.27)
GFR calc Af Amer: 89 mL/min/{1.73_m2} (ref >59)
GFR calc non Af Amer: 77 mL/min/{1.73_m2} (ref >59)
Glucose: 132 mg/dL — ABNORMAL HIGH (ref 65–99)
POTASSIUM: 4.2 mmol/L (ref 3.5–5.2)
Sodium: 145 mmol/L — ABNORMAL HIGH (ref 134–144)

## 2014-12-21 LAB — PSA: PROSTATE SPECIFIC AG, SERUM: 4 ng/mL (ref 0.0–4.0)

## 2014-12-21 LAB — HEMOGLOBIN A1C
ESTIMATED AVERAGE GLUCOSE: 171 mg/dL
HEMOGLOBIN A1C: 7.6 % — AB (ref 4.8–5.6)

## 2014-12-21 LAB — HEPATIC FUNCTION PANEL
ALT: 25 IU/L (ref 0–44)
AST: 37 IU/L (ref 0–40)
Albumin: 4.4 g/dL (ref 3.5–4.8)
Alkaline Phosphatase: 66 IU/L (ref 39–117)
BILIRUBIN TOTAL: 0.3 mg/dL (ref 0.0–1.2)
BILIRUBIN, DIRECT: 0.12 mg/dL (ref 0.00–0.40)
Total Protein: 7.1 g/dL (ref 6.0–8.5)

## 2014-12-21 LAB — LIPID PANEL
CHOLESTEROL TOTAL: 176 mg/dL (ref 100–199)
Chol/HDL Ratio: 5.2 ratio units — ABNORMAL HIGH (ref 0.0–5.0)
HDL: 34 mg/dL — AB (ref >39)
LDL Calculated: 67 mg/dL (ref 0–99)
Triglycerides: 375 mg/dL — ABNORMAL HIGH (ref 0–149)
VLDL Cholesterol Cal: 75 mg/dL — ABNORMAL HIGH (ref 5–40)

## 2014-12-25 ENCOUNTER — Ambulatory Visit (INDEPENDENT_AMBULATORY_CARE_PROVIDER_SITE_OTHER): Payer: Medicare Other | Admitting: Family Medicine

## 2014-12-25 VITALS — Ht 74.0 in

## 2014-12-25 DIAGNOSIS — M461 Sacroiliitis, not elsewhere classified: Secondary | ICD-10-CM | POA: Diagnosis not present

## 2014-12-25 DIAGNOSIS — Z888 Allergy status to other drugs, medicaments and biological substances status: Secondary | ICD-10-CM | POA: Diagnosis not present

## 2014-12-25 DIAGNOSIS — I2510 Atherosclerotic heart disease of native coronary artery without angina pectoris: Secondary | ICD-10-CM

## 2014-12-25 DIAGNOSIS — E785 Hyperlipidemia, unspecified: Secondary | ICD-10-CM | POA: Diagnosis not present

## 2014-12-25 DIAGNOSIS — Z794 Long term (current) use of insulin: Secondary | ICD-10-CM | POA: Diagnosis not present

## 2014-12-25 DIAGNOSIS — R972 Elevated prostate specific antigen [PSA]: Secondary | ICD-10-CM | POA: Diagnosis not present

## 2014-12-25 DIAGNOSIS — E1165 Type 2 diabetes mellitus with hyperglycemia: Secondary | ICD-10-CM

## 2014-12-25 DIAGNOSIS — Z7982 Long term (current) use of aspirin: Secondary | ICD-10-CM | POA: Diagnosis not present

## 2014-12-25 DIAGNOSIS — I10 Essential (primary) hypertension: Secondary | ICD-10-CM | POA: Diagnosis not present

## 2014-12-25 DIAGNOSIS — G8929 Other chronic pain: Secondary | ICD-10-CM | POA: Diagnosis not present

## 2014-12-25 DIAGNOSIS — E119 Type 2 diabetes mellitus without complications: Secondary | ICD-10-CM | POA: Diagnosis not present

## 2014-12-25 DIAGNOSIS — Z6836 Body mass index (BMI) 36.0-36.9, adult: Secondary | ICD-10-CM | POA: Diagnosis not present

## 2014-12-25 DIAGNOSIS — Z79899 Other long term (current) drug therapy: Secondary | ICD-10-CM | POA: Diagnosis not present

## 2014-12-25 DIAGNOSIS — E669 Obesity, unspecified: Secondary | ICD-10-CM | POA: Diagnosis not present

## 2014-12-25 DIAGNOSIS — Z87442 Personal history of urinary calculi: Secondary | ICD-10-CM | POA: Diagnosis not present

## 2014-12-25 DIAGNOSIS — M47816 Spondylosis without myelopathy or radiculopathy, lumbar region: Secondary | ICD-10-CM | POA: Diagnosis not present

## 2014-12-25 DIAGNOSIS — Z8249 Family history of ischemic heart disease and other diseases of the circulatory system: Secondary | ICD-10-CM | POA: Diagnosis not present

## 2014-12-25 DIAGNOSIS — Z833 Family history of diabetes mellitus: Secondary | ICD-10-CM | POA: Diagnosis not present

## 2014-12-25 DIAGNOSIS — IMO0002 Reserved for concepts with insufficient information to code with codable children: Secondary | ICD-10-CM

## 2014-12-25 DIAGNOSIS — Z86010 Personal history of colonic polyps: Secondary | ICD-10-CM | POA: Diagnosis not present

## 2014-12-25 DIAGNOSIS — E780 Pure hypercholesterolemia: Secondary | ICD-10-CM | POA: Diagnosis not present

## 2014-12-25 MED ORDER — INSULIN GLARGINE 100 UNIT/ML ~~LOC~~ SOLN: 100.0000 [IU] | Freq: Two times a day (BID) | SUBCUTANEOUS | Status: DC

## 2014-12-25 MED ORDER — PREGABALIN 50 MG PO CAPS: 50.0000 mg | ORAL_CAPSULE | Freq: Three times a day (TID) | ORAL | Status: DC

## 2014-12-25 MED ORDER — INSULIN ASPART 100 UNIT/ML FLEXPEN: 30.0000 [IU] | PEN_INJECTOR | Freq: Two times a day (BID) | SUBCUTANEOUS | Status: DC

## 2014-12-25 NOTE — Progress Notes (Signed)
   Subjective:    Patient ID: Francisco Powell, male    DOB: [birth year], 74 y.o.   MRN: [id number]  Diabetes He presents for his follow-up diabetic visit. He has type 2 diabetes mellitus. Risk factors for coronary artery disease include diabetes mellitus, dyslipidemia, hypertension and obesity. Current diabetic treatment includes insulin injections. He is compliant with treatment all of the time. His weight is stable. He is following a diabetic diet. He has not had a previous visit with a dietitian. He does not see a podiatrist.Eye exam is not current.   Patient getting shot in hip this pm. Patient would like to discuss recent lab work and hgb A1c  Review of Systems     Objective:   Physical Exam Patient with severe back pain Patient lungs clear heart regular Extremities no edema Positive straight leg raise on left       Assessment & Plan:  Diabetes subpar control adjust insulin accordingly Blood pressure under good control continue current measures Severe sciatica pain medication and Lyrica recommended If any problems let us know. Patient does not want to start Lyrica until he sees how the injections go he will let us know.  Patient's PSA is actually better than what it was we will follow this again in 6 months  A1c not as good as what I like to see adjustments on insulin given  Liver functions look good  Kidney functions look good  LDL is below 100

## 2015-01-11 ENCOUNTER — Telehealth: Payer: Self-pay | Admitting: *Deleted

## 2015-01-11 NOTE — Telephone Encounter (Signed)
Pt faxed over blood sugar readings for you to review.

## 2015-01-13 NOTE — Telephone Encounter (Signed)
Please let the patient know that his most current readings actually look much better than what they did earlier in the month. His long as he can maintain his medications and also maintain healthy eating we do not need to adjust diabetic treatment. If he starts having to do a lot more steroid injections and his numbers are going up please have the patient notify us. The patient should follow-up with [place] in the fall approximately mid to late October, we will repeat A1c and also be looking at his PSA again thank you

## 2015-01-14 NOTE — Telephone Encounter (Signed)
Discussed-advised that his most current readings actually look much better than what they did earlier in the month. As  long as he can maintain his medications and also maintain healthy eating we do not need to adjust diabetic treatment. If he starts having to do a lot more steroid injections and his numbers are going up please have the patient notify us. The patient should follow-up with [place] in the fall approximately mid to late October, we will repeat A1c and also be looking at his PSA again thank you.-verbalized understanding.

## 2015-01-16 DIAGNOSIS — G8929 Other chronic pain: Secondary | ICD-10-CM | POA: Diagnosis not present

## 2015-01-16 DIAGNOSIS — Z888 Allergy status to other drugs, medicaments and biological substances status: Secondary | ICD-10-CM | POA: Diagnosis not present

## 2015-01-16 DIAGNOSIS — M47816 Spondylosis without myelopathy or radiculopathy, lumbar region: Secondary | ICD-10-CM | POA: Diagnosis not present

## 2015-01-16 DIAGNOSIS — F17200 Nicotine dependence, unspecified, uncomplicated: Secondary | ICD-10-CM | POA: Diagnosis not present

## 2015-01-16 DIAGNOSIS — M461 Sacroiliitis, not elsewhere classified: Secondary | ICD-10-CM | POA: Diagnosis not present

## 2015-02-05 ENCOUNTER — Other Ambulatory Visit: Payer: Self-pay | Admitting: Family Medicine

## 2015-02-05 DIAGNOSIS — M4316 Spondylolisthesis, lumbar region: Secondary | ICD-10-CM | POA: Diagnosis not present

## 2015-02-05 DIAGNOSIS — M47816 Spondylosis without myelopathy or radiculopathy, lumbar region: Secondary | ICD-10-CM | POA: Diagnosis not present

## 2015-02-05 DIAGNOSIS — B0229 Other postherpetic nervous system involvement: Secondary | ICD-10-CM | POA: Diagnosis not present

## 2015-02-05 DIAGNOSIS — M7138 Other bursal cyst, other site: Secondary | ICD-10-CM | POA: Diagnosis not present

## 2015-02-13 DIAGNOSIS — M47816 Spondylosis without myelopathy or radiculopathy, lumbar region: Secondary | ICD-10-CM | POA: Diagnosis not present

## 2015-02-13 DIAGNOSIS — M461 Sacroiliitis, not elsewhere classified: Secondary | ICD-10-CM | POA: Diagnosis not present

## 2015-02-13 DIAGNOSIS — M545 Low back pain: Secondary | ICD-10-CM | POA: Diagnosis not present

## 2015-03-11 ENCOUNTER — Other Ambulatory Visit: Payer: Self-pay | Admitting: Family Medicine

## 2015-03-13 ENCOUNTER — Ambulatory Visit (INDEPENDENT_AMBULATORY_CARE_PROVIDER_SITE_OTHER): Payer: Medicare Other | Admitting: Cardiology

## 2015-03-13 ENCOUNTER — Encounter: Payer: Self-pay | Admitting: Cardiology

## 2015-03-13 VITALS — BP 146/78 | HR 68 | Ht 73.0 in | Wt 290.2 lb

## 2015-03-13 DIAGNOSIS — I2510 Atherosclerotic heart disease of native coronary artery without angina pectoris: Secondary | ICD-10-CM | POA: Diagnosis not present

## 2015-03-13 DIAGNOSIS — E782 Mixed hyperlipidemia: Secondary | ICD-10-CM

## 2015-03-13 DIAGNOSIS — I10 Essential (primary) hypertension: Secondary | ICD-10-CM

## 2015-03-13 NOTE — Patient Instructions (Signed)
Your physician wants you to follow-up in: 6 MONTHS WITH DR. MCDOWELL. You will receive a reminder letter in the mail two months in advance. If you don't receive a letter, please call our office to schedule the follow-up appointment.   Your physician recommends that you continue on your current medications as directed. Please refer to the Current Medication list given to you today.  Thanks for choosing [facility]!!!   

## 2015-03-13 NOTE — Progress Notes (Signed)
Cardiology Office Note  Date: 03/13/2015   ID: Francisco Powell, Francisco Powell [birth year], MRN [id number]  PCP: Sallee Lange, MD  Primary Cardiologist: Rozann Lesches, MD   Chief Complaint  Patient presents with  . Coronary Artery Disease    History of Present Illness: Francisco Powell is a 74 y.o. male last seen in June. He presents with his wife today for a follow-up visit. No problems with recurrent angina or increasing shortness of breath on medical therapy. His main complaint is of chronic lower back pain and leg pain, uses a cane to walk, significantly limited activity due to this. He has had injections for pain control per Dr. Francesco Runner over the summer.  He continues to follow with Dr. Wolfgang Phoenix. Most recent lab work from July is outlined below. LDL control is good on Pravachol.   Past Medical History  Diagnosis Date  . Coronary atherosclerosis of native coronary artery     DES RCA and staged DES LAD May 2015, LVEF 55%  . Hyperlipidemia   . Essential hypertension, benign   . Obesity   . Lumbar back pain     Spondylosis and spondylolisthesis   . Nephrolithiasis   . Asbestosis(501)   . Anemia, iron deficiency   . Hx of adenomatous colonic polyps   . Sciatica of right side   . Myocardial infarction 2004  . Type II diabetes mellitus   . Gout   . Anxiety   . Kidney carcinoma 1998  . NSTEMI (non-ST elevated myocardial infarction)     May 2015    Current Outpatient Prescriptions  Medication Sig Dispense Refill  . amLODipine (NORVASC) 10 MG tablet Take 1 tablet (10 mg total) by mouth daily. 90 tablet 2  . aspirin 81 MG tablet Take 81 mg by mouth daily.      . canagliflozin (INVOKANA) 300 MG TABS tablet Take 300 mg by mouth See admin instructions. 30 tablet 5  . carvedilol (COREG) 12.5 MG tablet TAKE 1 TABLET BY MOUTH TWICE DAILY WITH MEALS. 60 tablet 5  . insulin aspart (NOVOLOG FLEXPEN) 100 UNIT/ML FlexPen Inject 30 Units into the skin 2 (two) times daily. 15 mL 5  . insulin  glargine (LANTUS) 100 UNIT/ML injection Inject 1 mL (100 Units total) into the skin 2 (two) times daily. 10 mL 5  . lisinopril (PRINIVIL,ZESTRIL) 40 MG tablet TAKE 1 TABLET BY MOUTH ONCE A DAY. 90 tablet 1  . metFORMIN (GLUCOPHAGE) 1000 MG tablet TAKE 1 TABLET BY MOUTH TWICE DAILY WITH A MEAL. 180 tablet 0  . nitroGLYCERIN (NITROSTAT) 0.4 MG SL tablet Place 1 tablet (0.4 mg total) under the tongue every 5 (five) minutes x 3 doses as needed for chest pain. 25 tablet 4  . oxyCODONE-acetaminophen (PERCOCET) 10-325 MG per tablet Take 1 tablet by mouth every 4 (four) hours as needed for pain. Per DR ROY    . polyethylene glycol powder (GLYCOLAX/MIRALAX) powder     . pravastatin (PRAVACHOL) 40 MG tablet Take 1 tablet (40 mg total) by mouth every evening. 90 tablet 3   No current facility-administered medications for this visit.    Allergies:  Penicillins; Atorvastatin; Other; and Celexa   Social History: The patient  reports that he has never smoked. His smokeless tobacco use includes Chew. He reports that he does not drink alcohol or use illicit drugs.    ROS:  Please see the history of present illness. Otherwise, complete review of systems is positive for chronic back pain and leg pain.  All other systems are reviewed and negative.   Physical Exam: VS:  BP 146/78 mmHg  Pulse 68  Ht 6\' 1"  (1.854 m)  Wt 290 lb 3.2 oz (131.634 kg)  BMI 38.30 kg/m2  SpO2 92%, BMI Body mass index is 38.3 kg/(m^2).  Wt Readings from Last 3 Encounters:  03/13/15 290 lb 3.2 oz (131.634 kg)  11/15/14 287 lb (130.182 kg)  09/04/14 280 lb (127.007 kg)     Obese male, no distress.  HEENT: Conjunctiva and lids normal, oropharynx clear.  Neck: Supple, no elevated JVP, increased girth, no thyromegaly.  Lungs: Clear to auscultation, nonlabored breathing at rest.  Cardiac: Regular rate and rhythm, no S3 or significant systolic murmur, no pericardial rub.  Abdomen: Soft, nontender, bowel sounds present, no  guarding or rebound.  Extremities: No pitting edema, distal pulses 2+.  Skin: Warm and dry.  Musculoskeletal: No kyphosis.  Neuropsychiatric: Alert and oriented x3, affect grossly appropriate.   ECG: ECG is not ordered today.  Recent Labwork: 12/20/2014: ALT 25; AST 37; BUN 14; Creatinine, Ser 0.98; Potassium 4.2; Sodium 145*     Component Value Date/Time   CHOL 176 12/20/2014 0812   CHOL 154 02/21/2014 0745   TRIG 375* 12/20/2014 0812   HDL 34* 12/20/2014 0812   HDL 34* 02/21/2014 0745   CHOLHDL 5.2* 12/20/2014 0812   CHOLHDL 4.5 02/21/2014 0745   VLDL 55* 02/21/2014 0745   LDLCALC 67 12/20/2014 0812   LDLCALC 65 02/21/2014 0745    ASSESSMENT AND PLAN:  1. Symptomatically stable CAD status post DES to the RCA and staged DES to the LAD in May 2015. No change in current medical regimen. Continue observation.  2. Hyperlipidemia, on Pravachol, recent LDL 67.  3. Essential hypertension, no changes made to current regimen. Keep follow-up with Dr. Wolfgang Phoenix.  Current medicines were reviewed at length with the patient today.  Disposition: FU with me in 6 months.   Signed, [name], MD, [facility] 03/13/2015 9:19 AM    [place] [facility] [place]. [address], [place], [place] [address] Phone: [phone]; Fax: [phone]

## 2015-03-15 ENCOUNTER — Other Ambulatory Visit: Payer: Self-pay | Admitting: Family Medicine

## 2015-04-04 ENCOUNTER — Ambulatory Visit (INDEPENDENT_AMBULATORY_CARE_PROVIDER_SITE_OTHER): Payer: Medicare Other | Admitting: Gastroenterology

## 2015-04-04 ENCOUNTER — Other Ambulatory Visit: Payer: Self-pay

## 2015-04-04 ENCOUNTER — Encounter: Payer: Self-pay | Admitting: Gastroenterology

## 2015-04-04 VITALS — BP 174/87 | HR 74 | Temp 97.3°F | Ht 69.0 in | Wt 292.6 lb

## 2015-04-04 DIAGNOSIS — Z86010 Personal history of colonic polyps: Secondary | ICD-10-CM

## 2015-04-04 DIAGNOSIS — I2510 Atherosclerotic heart disease of native coronary artery without angina pectoris: Secondary | ICD-10-CM | POA: Diagnosis not present

## 2015-04-04 DIAGNOSIS — Z800 Family history of malignant neoplasm of digestive organs: Secondary | ICD-10-CM

## 2015-04-04 MED ORDER — BISACODYL 5 MG PO TBEC: 5.0000 mg | DELAYED_RELEASE_TABLET | ORAL | Status: DC

## 2015-04-04 MED ORDER — PEG 3350-KCL-NA BICARB-NACL 420 G PO SOLR: 4000.0000 mL | Freq: Once | ORAL | Status: DC

## 2015-04-04 MED ORDER — PHOSPHATE LAXATIVE 2.7-7.2 GM/15ML PO SOLN: 15.0000 mL | Freq: Once | ORAL | Status: DC

## 2015-04-04 NOTE — Assessment & Plan Note (Addendum)
74 year old gentleman who presents for surveillance colonoscopy due to family history of colon cancer and personal history of adenomatous colon polyps. Tendency towards chronic constipation. Doing well on MiraLAX. Colonoscopy in the near future.  I have discussed the risks, alternatives, benefits with regards to but not limited to the risk of reaction to medication, bleeding, infection, perforation and the patient is agreeable to proceed. Written consent to be obtained.  Additional dulcolax 10mg  daily for 3 days before prep.  Continue miralax 17grams daily.  Phenergan 12.5 mg IV 30 minutes before the procedure to augment conscious sedation given history of chronic narcotics.  Children should begin colonoscopies at age 5.

## 2015-04-04 NOTE — Progress Notes (Signed)
Primary Care Physician:  Sallee Lange, MD  Primary Gastroenterologist:  Garfield Cornea, MD   Chief Complaint  Patient presents with  . Colonoscopy    HPI:  Francisco Powell is a 74 y.o. male here to schedule surveillance colonoscopy. Last colonoscopy in June 2011 with normal rectum, left-sided diverticula. History of prior adenomatous colon polyps. Family history significant for colon cancer. Mother deceased at age 46 from colon cancer. 2 maternal uncles and 1 maternal great uncle all with colon cancer as well. Patient's sister has colon polyps.  Since his last office visit he has been taking MiraLAX 17 g daily. Helps with his bowel movements. He has a daily soft stool. Denies melena or rectal bleeding. Denies abdominal pain. Heartburn well-controlled. No dysphagia or vomiting.  No longer on Brilinta. Takes ASA 81mg  daily.     Current Outpatient Prescriptions  Medication Sig Dispense Refill  . amLODipine (NORVASC) 10 MG tablet Take 1 tablet (10 mg total) by mouth daily. 90 tablet 2  . aspirin 81 MG tablet Take 81 mg by mouth daily.      . carvedilol (COREG) 12.5 MG tablet TAKE 1 TABLET BY MOUTH TWICE DAILY WITH MEALS. 60 tablet 5  . insulin aspart (NOVOLOG FLEXPEN) 100 UNIT/ML FlexPen Inject 30 Units into the skin 2 (two) times daily. (Patient taking differently: Inject 25 Units into the skin 2 (two) times daily. ) 15 mL 5  . insulin glargine (LANTUS) 100 UNIT/ML injection Inject 1 mL (100 Units total) into the skin 2 (two) times daily. 10 mL 5  . INVOKANA 300 MG TABS tablet TAKE (1) TABLET BY MOUTH ONCE DAILY. 30 tablet 5  . lisinopril (PRINIVIL,ZESTRIL) 40 MG tablet TAKE 1 TABLET BY MOUTH ONCE A DAY. 90 tablet 1  . metFORMIN (GLUCOPHAGE) 1000 MG tablet TAKE 1 TABLET BY MOUTH TWICE DAILY WITH A MEAL. 180 tablet 0  . nitroGLYCERIN (NITROSTAT) 0.4 MG SL tablet Place 1 tablet (0.4 mg total) under the tongue every 5 (five) minutes x 3 doses as needed for chest pain. 25 tablet 4  .  oxyCODONE-acetaminophen (PERCOCET) 10-325 MG per tablet Take 1 tablet by mouth every 4 (four) hours as needed for pain. Per DR ROY    . polyethylene glycol powder (GLYCOLAX/MIRALAX) powder     . pravastatin (PRAVACHOL) 40 MG tablet Take 1 tablet (40 mg total) by mouth every evening. 90 tablet 3   No current facility-administered medications for this visit.    Allergies as of 04/04/2015 - Review Complete 04/04/2015  Allergen Reaction Noted  . Penicillins Hives, Itching, and Swelling 04/15/2011  . Atorvastatin  10/31/2008  . Other  12/15/2012  . Celexa [citalopram]  02/28/2014    Past Medical History  Diagnosis Date  . Coronary atherosclerosis of native coronary artery     DES RCA and staged DES LAD May 2015, LVEF 55%  . Hyperlipidemia   . Essential hypertension, benign   . Obesity   . Lumbar back pain     Spondylosis and spondylolisthesis   . Nephrolithiasis   . Asbestosis(501)   . Anemia, iron deficiency   . Hx of adenomatous colonic polyps   . Sciatica of right side   . Myocardial infarction ([place]) 2004  . Type II diabetes mellitus ([place])   . Gout   . Anxiety   . Kidney carcinoma ([place]) 1998  . NSTEMI (non-ST elevated myocardial infarction) [facility])     May 2015    Past Surgical History  Procedure Laterality Date  . Holmium  laser application Right 7616  . Cholecystectomy      Biliary dyskinesia  . Retinal detachment surgery Right 1998    Secondary to injury detached retina/cataracts  . Cystoscopy w/ litholapaxy / ehl  2009  . Appendectomy    . Tonsillectomy    . Colonoscopy  2009    Dr. Arnoldo Morale: normal  . Back surgery  X 4  . Lumbar disc surgery  X 3  . Posterior lumbar fusion  2014  . Eye surgery    . Tumor excision  1995  . Esophagogastroduodenoscopy  June 2011    Dr. Gala Romney: erosive reflux esophagitis, small hiatal hernia, +H.pylori gastritis  . Colonoscopy  June 2011    Dr. Gala Romney: normal rectum, left-sided diverticula  . Left heart catheterization with  coronary angiogram N/A 11/09/2013    Procedure: LEFT HEART CATHETERIZATION WITH CORONARY ANGIOGRAM;  Surgeon: [name], MD;  Location: [facility] CATH LAB;  Service: Cardiovascular;  Laterality: N/A;  . Percutaneous coronary stent intervention (pci-s) N/A 11/10/2013    Procedure: PERCUTANEOUS CORONARY STENT INTERVENTION (PCI-S);  Surgeon: [name], MD;  Location: [facility] CATH LAB;  Service: Cardiovascular;  Laterality: N/A;    Family History  Problem Relation Age of Onset  . Colon cancer Mother     deceased   . Breast cancer Sister   . Cancer Maternal Grandmother     Gastric cancer  . Heart attack Father      Social History   Social History  . Marital Status: Married    Spouse Name: N/A  . Number of Children: N/A  . Years of Education: N/A   Occupational History  . Not on file.   Social History Main Topics  . Smoking status: Never Smoker   . Smokeless tobacco: Current User    Types: Chew  . Alcohol Use: No  . Drug Use: No  . Sexual Activity: Yes   Other Topics Concern  . Not on file   Social History Narrative      ROS:  General: Negative for anorexia, weight loss, fever, chills, fatigue, weakness. Eyes: Negative for vision changes.  ENT: Negative for hoarseness, difficulty swallowing , nasal congestion. CV: Negative for chest pain, angina, palpitations, dyspnea on exertion, peripheral edema.  Respiratory: Negative for dyspnea at rest, dyspnea on exertion, cough, sputum, wheezing.  GI: See history of present illness. GU:  Negative for dysuria, hematuria, urinary incontinence, urinary frequency, nocturnal urination.  MS: Negative for joint pain, low back pain.  Derm: Negative for rash or itching.  Neuro: Negative for weakness, abnormal sensation, seizure, frequent headaches, memory loss, confusion.  Psych: Negative for anxiety, depression, suicidal ideation, hallucinations.  Endo: Negative for unusual weight change.  Heme: Negative for bruising or  bleeding. Allergy: Negative for rash or hives.    Physical Examination:  BP 174/87 mmHg  Pulse 74  Temp(Src) 97.3 F (36.3 C) (Oral)  Ht 5\' 9"  (1.753 m)  Wt 292 lb 9.6 oz (132.722 kg)  BMI 43.19 kg/m2   General: Well-nourished, well-developed in no acute distress.  Head: Normocephalic, atraumatic.   Eyes: Conjunctiva pink, no icterus. Mouth: Oropharyngeal mucosa moist and pink , no lesions erythema or exudate. Neck: Supple without thyromegaly, masses, or lymphadenopathy.  Lungs: Clear to auscultation bilaterally.  Heart: Regular rate and rhythm, no murmurs rubs or gallops.  Abdomen: Bowel sounds are normal, nontender, nondistended, no hepatosplenomegaly or masses, no abdominal bruits or    hernia , no rebound or guarding.   Rectal: not performed  Extremities: No lower extremity edema. No clubbing or deformities.  Neuro: Alert and oriented x 4 , grossly normal neurologically.  Skin: Warm and dry, no rash or jaundice.   Psych: Alert and cooperative, normal mood and affect.  Labs: Lab Results  Component Value Date   WBC 11.4* 11/11/2013   HGB 13.0 11/11/2013   HCT 41.7 11/11/2013   MCV 80.3 11/11/2013   PLT 241 11/11/2013   Lab Results  Component Value Date   CREATININE 0.98 12/20/2014   BUN 14 12/20/2014   NA 145* 12/20/2014   K 4.2 12/20/2014   CL 105 12/20/2014   CO2 21 12/20/2014   Lab Results  Component Value Date   ALT 25 12/20/2014   AST 37 12/20/2014   ALKPHOS 66 12/20/2014   BILITOT 0.3 12/20/2014     Imaging Studies: No results found.

## 2015-04-04 NOTE — Progress Notes (Signed)
cc'ed to pcp °

## 2015-04-04 NOTE — Patient Instructions (Signed)
1. Colonoscopy as scheduled. See separate instructions.  

## 2015-04-12 ENCOUNTER — Ambulatory Visit (HOSPITAL_COMMUNITY)
Admission: RE | Admit: 2015-04-12 | Discharge: 2015-04-12 | Disposition: A | Discharge status: Home / Self Care | Payer: Medicare Other | Source: Ambulatory Visit | Attending: Internal Medicine | Admitting: Internal Medicine

## 2015-04-12 ENCOUNTER — Encounter (HOSPITAL_COMMUNITY)
Admission: RE | Disposition: A | Discharge status: Home / Self Care | Payer: Self-pay | Source: Ambulatory Visit | Attending: Internal Medicine

## 2015-04-12 ENCOUNTER — Encounter (HOSPITAL_COMMUNITY): Payer: Self-pay | Admitting: *Deleted

## 2015-04-12 DIAGNOSIS — Z7982 Long term (current) use of aspirin: Secondary | ICD-10-CM | POA: Insufficient documentation

## 2015-04-12 DIAGNOSIS — I10 Essential (primary) hypertension: Secondary | ICD-10-CM | POA: Insufficient documentation

## 2015-04-12 DIAGNOSIS — Z6841 Body Mass Index (BMI) 40.0 and over, adult: Secondary | ICD-10-CM | POA: Diagnosis not present

## 2015-04-12 DIAGNOSIS — I2510 Atherosclerotic heart disease of native coronary artery without angina pectoris: Secondary | ICD-10-CM | POA: Diagnosis not present

## 2015-04-12 DIAGNOSIS — Z800 Family history of malignant neoplasm of digestive organs: Secondary | ICD-10-CM | POA: Insufficient documentation

## 2015-04-12 DIAGNOSIS — Z8371 Family history of colonic polyps: Secondary | ICD-10-CM | POA: Insufficient documentation

## 2015-04-12 DIAGNOSIS — Z79899 Other long term (current) drug therapy: Secondary | ICD-10-CM | POA: Diagnosis not present

## 2015-04-12 DIAGNOSIS — Z1211 Encounter for screening for malignant neoplasm of colon: Secondary | ICD-10-CM | POA: Diagnosis not present

## 2015-04-12 DIAGNOSIS — D120 Benign neoplasm of cecum: Secondary | ICD-10-CM | POA: Insufficient documentation

## 2015-04-12 DIAGNOSIS — Z7984 Long term (current) use of oral hypoglycemic drugs: Secondary | ICD-10-CM | POA: Diagnosis not present

## 2015-04-12 DIAGNOSIS — Q438 Other specified congenital malformations of intestine: Secondary | ICD-10-CM | POA: Diagnosis not present

## 2015-04-12 DIAGNOSIS — E785 Hyperlipidemia, unspecified: Secondary | ICD-10-CM | POA: Diagnosis not present

## 2015-04-12 DIAGNOSIS — Z86010 Personal history of colonic polyps: Secondary | ICD-10-CM

## 2015-04-12 DIAGNOSIS — E119 Type 2 diabetes mellitus without complications: Secondary | ICD-10-CM | POA: Insufficient documentation

## 2015-04-12 DIAGNOSIS — Z794 Long term (current) use of insulin: Secondary | ICD-10-CM | POA: Insufficient documentation

## 2015-04-12 DIAGNOSIS — F419 Anxiety disorder, unspecified: Secondary | ICD-10-CM | POA: Diagnosis not present

## 2015-04-12 DIAGNOSIS — Z85528 Personal history of other malignant neoplasm of kidney: Secondary | ICD-10-CM | POA: Insufficient documentation

## 2015-04-12 DIAGNOSIS — E669 Obesity, unspecified: Secondary | ICD-10-CM | POA: Insufficient documentation

## 2015-04-12 HISTORY — PX: COLONOSCOPY: SHX5424

## 2015-04-12 LAB — GLUCOSE, CAPILLARY: GLUCOSE-CAPILLARY: 124 mg/dL — AB (ref 65–99)

## 2015-04-12 SURGERY — COLONOSCOPY
Anesthesia: Moderate Sedation

## 2015-04-12 MED ORDER — MEPERIDINE HCL 100 MG/ML IJ SOLN
INTRAMUSCULAR | Status: AC
  Filled 2015-04-12: qty 2

## 2015-04-12 MED ORDER — SIMETHICONE 40 MG/0.6ML PO SUSP
ORAL | Status: DC | PRN
  Administered 2015-04-12: 11:00:00

## 2015-04-12 MED ORDER — SODIUM CHLORIDE 0.9 % IV SOLN
INTRAVENOUS | Status: DC
  Administered 2015-04-12: 11:00:00 via INTRAVENOUS

## 2015-04-12 MED ORDER — ONDANSETRON HCL 4 MG/2ML IJ SOLN
INTRAMUSCULAR | Status: DC | PRN
  Administered 2015-04-12: 4 mg via INTRAVENOUS

## 2015-04-12 MED ORDER — PROMETHAZINE HCL 25 MG/ML IJ SOLN
INTRAMUSCULAR | Status: AC
  Filled 2015-04-12: qty 1

## 2015-04-12 MED ORDER — MIDAZOLAM HCL 5 MG/5ML IJ SOLN
INTRAMUSCULAR | Status: DC | PRN
  Administered 2015-04-12 (×3): 1 mg via INTRAVENOUS
  Administered 2015-04-12: 2 mg via INTRAVENOUS

## 2015-04-12 MED ORDER — SODIUM CHLORIDE 0.9 % IJ SOLN
INTRAMUSCULAR | Status: AC
  Filled 2015-04-12: qty 3

## 2015-04-12 MED ORDER — ONDANSETRON HCL 4 MG/2ML IJ SOLN
INTRAMUSCULAR | Status: AC
  Filled 2015-04-12: qty 2

## 2015-04-12 MED ORDER — MIDAZOLAM HCL 5 MG/5ML IJ SOLN
INTRAMUSCULAR | Status: AC
  Filled 2015-04-12: qty 10

## 2015-04-12 MED ORDER — MEPERIDINE HCL 100 MG/ML IJ SOLN
INTRAMUSCULAR | Status: DC | PRN
  Administered 2015-04-12: 50 mg via INTRAVENOUS
  Administered 2015-04-12: 25 mg via INTRAVENOUS

## 2015-04-12 MED ORDER — PROMETHAZINE HCL 25 MG/ML IJ SOLN
12.5000 mg | Freq: Once | INTRAMUSCULAR | Status: AC
Start: 2015-04-12 — End: 2015-04-12
  Administered 2015-04-12: 12.5 mg via INTRAVENOUS

## 2015-04-12 NOTE — Interval H&P Note (Signed)
History and Physical Interval Note:  04/12/2015 11:19 AM  Francisco Powell  has presented today for surgery, with the diagnosis of family history of colon cancer,  history of colon polyps  The various methods of treatment have been discussed with the patient and family. After consideration of risks, benefits and other options for treatment, the patient has consented to  Procedure(s) with comments: COLONOSCOPY (N/A) - 1115  as a surgical intervention .  The patient's history has been reviewed, patient examined, no change in status, stable for surgery.  I have reviewed the patient's chart and labs.  Questions were answered to the patient's satisfaction.     Jerrin Recore  No change. Surveillance colonoscopy per plan.  The risks, benefits, limitations, alternatives and imponderables have been reviewed with the patient. Questions have been answered. All parties are agreeable.

## 2015-04-12 NOTE — H&P (View-Only) (Signed)
Primary Care Physician:  Sallee Lange, MD  Primary Gastroenterologist:  Garfield Cornea, MD   Chief Complaint  Patient presents with  . Colonoscopy    HPI:  Francisco Powell is a 74 y.o. male here to schedule surveillance colonoscopy. Last colonoscopy in June 2011 with normal rectum, left-sided diverticula. History of prior adenomatous colon polyps. Family history significant for colon cancer. Mother deceased at age 71 from colon cancer. 2 maternal uncles and 1 maternal great uncle all with colon cancer as well. Patient's sister has colon polyps.  Since his last office visit he has been taking MiraLAX 17 g daily. Helps with his bowel movements. He has a daily soft stool. Denies melena or rectal bleeding. Denies abdominal pain. Heartburn well-controlled. No dysphagia or vomiting.  No longer on Brilinta. Takes ASA 81mg  daily.     Current Outpatient Prescriptions  Medication Sig Dispense Refill  . amLODipine (NORVASC) 10 MG tablet Take 1 tablet (10 mg total) by mouth daily. 90 tablet 2  . aspirin 81 MG tablet Take 81 mg by mouth daily.      . carvedilol (COREG) 12.5 MG tablet TAKE 1 TABLET BY MOUTH TWICE DAILY WITH MEALS. 60 tablet 5  . insulin aspart (NOVOLOG FLEXPEN) 100 UNIT/ML FlexPen Inject 30 Units into the skin 2 (two) times daily. (Patient taking differently: Inject 25 Units into the skin 2 (two) times daily. ) 15 mL 5  . insulin glargine (LANTUS) 100 UNIT/ML injection Inject 1 mL (100 Units total) into the skin 2 (two) times daily. 10 mL 5  . INVOKANA 300 MG TABS tablet TAKE (1) TABLET BY MOUTH ONCE DAILY. 30 tablet 5  . lisinopril (PRINIVIL,ZESTRIL) 40 MG tablet TAKE 1 TABLET BY MOUTH ONCE A DAY. 90 tablet 1  . metFORMIN (GLUCOPHAGE) 1000 MG tablet TAKE 1 TABLET BY MOUTH TWICE DAILY WITH A MEAL. 180 tablet 0  . nitroGLYCERIN (NITROSTAT) 0.4 MG SL tablet Place 1 tablet (0.4 mg total) under the tongue every 5 (five) minutes x 3 doses as needed for chest pain. 25 tablet 4  .  oxyCODONE-acetaminophen (PERCOCET) 10-325 MG per tablet Take 1 tablet by mouth every 4 (four) hours as needed for pain. Per DR ROY    . polyethylene glycol powder (GLYCOLAX/MIRALAX) powder     . pravastatin (PRAVACHOL) 40 MG tablet Take 1 tablet (40 mg total) by mouth every evening. 90 tablet 3   No current facility-administered medications for this visit.    Allergies as of 04/04/2015 - Review Complete 04/04/2015  Allergen Reaction Noted  . Penicillins Hives, Itching, and Swelling 04/15/2011  . Atorvastatin  10/31/2008  . Other  12/15/2012  . Celexa [citalopram]  02/28/2014    Past Medical History  Diagnosis Date  . Coronary atherosclerosis of native coronary artery     DES RCA and staged DES LAD May 2015, LVEF 55%  . Hyperlipidemia   . Essential hypertension, benign   . Obesity   . Lumbar back pain     Spondylosis and spondylolisthesis   . Nephrolithiasis   . Asbestosis(501)   . Anemia, iron deficiency   . Hx of adenomatous colonic polyps   . Sciatica of right side   . Myocardial infarction ([place]) 2004  . Type II diabetes mellitus ([place])   . Gout   . Anxiety   . Kidney carcinoma ([place]) 1998  . NSTEMI (non-ST elevated myocardial infarction) [facility])     May 2015    Past Surgical History  Procedure Laterality Date  . Holmium  laser application Right 5784  . Cholecystectomy      Biliary dyskinesia  . Retinal detachment surgery Right 1998    Secondary to injury detached retina/cataracts  . Cystoscopy w/ litholapaxy / ehl  2009  . Appendectomy    . Tonsillectomy    . Colonoscopy  2009    Dr. Arnoldo Morale: normal  . Back surgery  X 4  . Lumbar disc surgery  X 3  . Posterior lumbar fusion  2014  . Eye surgery    . Tumor excision  1995  . Esophagogastroduodenoscopy  June 2011    Dr. Gala Romney: erosive reflux esophagitis, small hiatal hernia, +H.pylori gastritis  . Colonoscopy  June 2011    Dr. Gala Romney: normal rectum, left-sided diverticula  . Left heart catheterization with  coronary angiogram N/A 11/09/2013    Procedure: LEFT HEART CATHETERIZATION WITH CORONARY ANGIOGRAM;  Surgeon: [name], MD;  Location: [facility] CATH LAB;  Service: Cardiovascular;  Laterality: N/A;  . Percutaneous coronary stent intervention (pci-s) N/A 11/10/2013    Procedure: PERCUTANEOUS CORONARY STENT INTERVENTION (PCI-S);  Surgeon: [name], MD;  Location: [facility] CATH LAB;  Service: Cardiovascular;  Laterality: N/A;    Family History  Problem Relation Age of Onset  . Colon cancer Mother     deceased   . Breast cancer Sister   . Cancer Maternal Grandmother     Gastric cancer  . Heart attack Father      Social History   Social History  . Marital Status: Married    Spouse Name: N/A  . Number of Children: N/A  . Years of Education: N/A   Occupational History  . Not on file.   Social History Main Topics  . Smoking status: Never Smoker   . Smokeless tobacco: Current User    Types: Chew  . Alcohol Use: No  . Drug Use: No  . Sexual Activity: Yes   Other Topics Concern  . Not on file   Social History Narrative      ROS:  General: Negative for anorexia, weight loss, fever, chills, fatigue, weakness. Eyes: Negative for vision changes.  ENT: Negative for hoarseness, difficulty swallowing , nasal congestion. CV: Negative for chest pain, angina, palpitations, dyspnea on exertion, peripheral edema.  Respiratory: Negative for dyspnea at rest, dyspnea on exertion, cough, sputum, wheezing.  GI: See history of present illness. GU:  Negative for dysuria, hematuria, urinary incontinence, urinary frequency, nocturnal urination.  MS: Negative for joint pain, low back pain.  Derm: Negative for rash or itching.  Neuro: Negative for weakness, abnormal sensation, seizure, frequent headaches, memory loss, confusion.  Psych: Negative for anxiety, depression, suicidal ideation, hallucinations.  Endo: Negative for unusual weight change.  Heme: Negative for bruising or  bleeding. Allergy: Negative for rash or hives.    Physical Examination:  BP 174/87 mmHg  Pulse 74  Temp(Src) 97.3 F (36.3 C) (Oral)  Ht 5\' 9"  (1.753 m)  Wt 292 lb 9.6 oz (132.722 kg)  BMI 43.19 kg/m2   General: Well-nourished, well-developed in no acute distress.  Head: Normocephalic, atraumatic.   Eyes: Conjunctiva pink, no icterus. Mouth: Oropharyngeal mucosa moist and pink , no lesions erythema or exudate. Neck: Supple without thyromegaly, masses, or lymphadenopathy.  Lungs: Clear to auscultation bilaterally.  Heart: Regular rate and rhythm, no murmurs rubs or gallops.  Abdomen: Bowel sounds are normal, nontender, nondistended, no hepatosplenomegaly or masses, no abdominal bruits or    hernia , no rebound or guarding.   Rectal: not performed  Extremities: No lower extremity edema. No clubbing or deformities.  Neuro: Alert and oriented x 4 , grossly normal neurologically.  Skin: Warm and dry, no rash or jaundice.   Psych: Alert and cooperative, normal mood and affect.  Labs: Lab Results  Component Value Date   WBC 11.4* 11/11/2013   HGB 13.0 11/11/2013   HCT 41.7 11/11/2013   MCV 80.3 11/11/2013   PLT 241 11/11/2013   Lab Results  Component Value Date   CREATININE 0.98 12/20/2014   BUN 14 12/20/2014   NA 145* 12/20/2014   K 4.2 12/20/2014   CL 105 12/20/2014   CO2 21 12/20/2014   Lab Results  Component Value Date   ALT 25 12/20/2014   AST 37 12/20/2014   ALKPHOS 66 12/20/2014   BILITOT 0.3 12/20/2014     Imaging Studies: No results found.

## 2015-04-12 NOTE — Op Note (Signed)
[facility] [address] [place], [address]   COLONOSCOPY PROCEDURE REPORT  PATIENT: Francisco Powell, Francisco Powell  MR#: [id number] BIRTHDATE: [birth year] , 73  yrs. old GENDER: male ENDOSCOPIST: R.  Garfield Cornea, MD FACP [facility] REFERRED PQ:ZRAQT Wolfgang Phoenix, M.D. PROCEDURE DATE:  05/12/15 PROCEDURE:   Colonoscopy with biopsy INDICATIONS: Personal history of colonic adenoma; positive family history of cancer. MEDICATIONS: Versed 5 mg IV and Demerol 75 mg IV in divided doses. Phenergan 12.5 mg IV.  Zofran 4 mg IV. ASA CLASS:       Class III  CONSENT: The risks, benefits, alternatives and imponderables including but not limited to bleeding, perforation as well as the possibility of a missed lesion have been reviewed.  The potential for biopsy, lesion removal, etc. have also been discussed. Questions have been answered.  All parties agreeable.  Please see the history and physical in the medical record for more information.  DESCRIPTION OF PROCEDURE:   After the risks benefits and alternatives of the procedure were thoroughly explained, informed consent was obtained.  The digital rectal exam      The EC-[id number] ([id number])  endoscope was introduced through the anus and advanced to the cecum, which was identified by both the appendix and ileocecal valve. No adverse events experienced.   The quality of the prep was adequate  The instrument was then slowly withdrawn as the colon was fully examined. Estimated blood loss is zero unless otherwise noted in this procedure report.      COLON FINDINGS: Normal-appearing rectal mucosa.  Markedly redundant colon, requiring a number of position changes and external abdominal pressure to reach the cecum.  Patient had (1) diminutive polyp in the base to cecum; otherwise, the remainder of the colonic mucosa appeared normal.  The above-mentioned polyp was cold biopsy/removed.  Retroflexed views revealed no abnormalities. .  Withdrawal  time=14 minutes 0 seconds.  The scope was withdrawn and the procedure completed. COMPLICATIONS: There were no immediate complications. EBL 2 mL ENDOSCOPIC IMPRESSION: Redundant colon. Single colonic polyp?"removed as described above.  RECOMMENDATIONS: Follow up on pathology.  eSigned:  R. Garfield Cornea, MD Rosalita Chessman [facility] 05-12-15 12:04 PM   cc:  CPT CODES: ICD CODES:  The ICD and CPT codes recommended by this software are interpretations from the data that the clinical staff has captured with the software.  The verification of the translation of this report to the ICD and CPT codes and modifiers is the sole responsibility of the health care institution and practicing physician where this report was generated.  [employer] will not be held responsible for the validity of the ICD and CPT codes included on this report.  AMA assumes no liability for data contained or not contained herein. CPT is a Designer, television/film set of the [employer].

## 2015-04-12 NOTE — Discharge Instructions (Signed)
°Colonoscopy °Discharge Instructions ° °Read the instructions outlined below and refer to this sheet in the next few weeks. These discharge instructions provide you with general information on caring for yourself after you leave the hospital. Your doctor may also give you specific instructions. While your treatment has been planned according to the most current medical practices available, unavoidable complications occasionally occur. If you have any problems or questions after discharge, call Dr. Rourk at [phone]. °ACTIVITY °· You may resume your regular activity, but move at a slower pace for the next 24 hours.  °· Take frequent rest periods for the next 24 hours.  °· Walking will help get rid of the air and reduce the bloated feeling in your belly (abdomen).  °· No driving for 24 hours (because of the medicine (anesthesia) used during the test).   °· Do not sign any important legal documents or operate any machinery for 24 hours (because of the anesthesia used during the test).  °NUTRITION °· Drink plenty of fluids.  °· You may resume your normal diet as instructed by your doctor.  °· Begin with a light meal and progress to your normal diet. Heavy or fried foods are harder to digest and may make you feel sick to your stomach (nauseated).  °· Avoid alcoholic beverages for 24 hours or as instructed.  °MEDICATIONS °· You may resume your normal medications unless your doctor tells you otherwise.  °WHAT YOU CAN EXPECT TODAY °· Some feelings of bloating in the abdomen.  °· Passage of more gas than usual.  °· Spotting of blood in your stool or on the toilet paper.  °IF YOU HAD POLYPS REMOVED DURING THE COLONOSCOPY: °· No aspirin products for 7 days or as instructed.  °· No alcohol for 7 days or as instructed.  °· Eat a soft diet for the next 24 hours.  °FINDING OUT THE RESULTS OF YOUR TEST °Not all test results are available during your visit. If your test results are not back during the visit, make an appointment  with your caregiver to find out the results. Do not assume everything is normal if you have not heard from your caregiver or the medical facility. It is important for you to follow up on all of your test results.  °SEEK IMMEDIATE MEDICAL ATTENTION IF: °· You have more than a spotting of blood in your stool.  °· Your belly is swollen (abdominal distention).  °· You are nauseated or vomiting.  °· You have a temperature over 101.  °· You have abdominal pain or discomfort that is severe or gets worse throughout the day.  ° °Polyp information provided ° °Further recommendations to follow pending review of pathology report ° °Colon Polyps °Polyps are lumps of extra tissue growing inside the body. Polyps can grow in the large intestine (colon). Most colon polyps are noncancerous (benign). However, some colon polyps can become cancerous over time. Polyps that are larger than a pea may be harmful. To be safe, caregivers remove and test all polyps. °CAUSES  °Polyps form when mutations in the genes cause your cells to grow and divide even though no more tissue is needed. °RISK FACTORS °There are a number of risk factors that can increase your chances of getting colon polyps. They include: °· Being older than 50 years. °· Family history of colon polyps or colon cancer. °· Long-term colon diseases, such as colitis or Crohn disease. °· Being overweight. °· Smoking. °· Being inactive. °· Drinking too much alcohol. °SYMPTOMS  °  Most small polyps do not cause symptoms. If symptoms are present, they may include: °· Blood in the stool. The stool may look dark red or black. °· Constipation or diarrhea that lasts longer than 1 week. °DIAGNOSIS °People often do not know they have polyps until their caregiver finds them during a regular checkup. Your caregiver can use 4 tests to check for polyps: °· Digital rectal exam. The caregiver wears gloves and feels inside the rectum. This test would find polyps only in the rectum. °· Barium enema.  The caregiver puts a liquid called barium into your rectum before taking X-rays of your colon. Barium makes your colon look white. Polyps are dark, so they are easy to see in the X-ray pictures. °· Sigmoidoscopy. A thin, flexible tube (sigmoidoscope) is placed into your rectum. The sigmoidoscope has a light and tiny camera in it. The caregiver uses the sigmoidoscope to look at the last third of your colon. °· Colonoscopy. This test is like sigmoidoscopy, but the caregiver looks at the entire colon. This is the most common method for finding and removing polyps. °TREATMENT  °Any polyps will be removed during a sigmoidoscopy or colonoscopy. The polyps are then tested for cancer. °PREVENTION  °To help lower your risk of getting more colon polyps: °· Eat plenty of fruits and vegetables. Avoid eating fatty foods. °· Do not smoke. °· Avoid drinking alcohol. °· Exercise every day. °· Lose weight if recommended by your caregiver. °· Eat plenty of calcium and folate. Foods that are rich in calcium include milk, cheese, and broccoli. Foods that are rich in folate include chickpeas, kidney beans, and spinach. °HOME CARE INSTRUCTIONS °Keep all follow-up appointments as directed by your caregiver. You may need periodic exams to check for polyps. °SEEK MEDICAL CARE IF: °You notice bleeding during a bowel movement. °  °This information is not intended to replace advice given to you by your health care provider. Make sure you discuss any questions you have with your health care provider. °  °Document Released: 02/26/2004 Document Revised: 06/22/2014 Document Reviewed: 08/11/2011 °Elsevier Interactive Patient Education ©2016 [employer] ° °

## 2015-04-15 DIAGNOSIS — Z23 Encounter for immunization: Secondary | ICD-10-CM | POA: Diagnosis not present

## 2015-04-16 ENCOUNTER — Encounter: Payer: Self-pay | Admitting: Internal Medicine

## 2015-04-17 ENCOUNTER — Encounter (HOSPITAL_COMMUNITY): Payer: Self-pay | Admitting: Internal Medicine

## 2015-05-08 ENCOUNTER — Other Ambulatory Visit: Payer: Self-pay | Admitting: Family Medicine

## 2015-05-27 DIAGNOSIS — M47816 Spondylosis without myelopathy or radiculopathy, lumbar region: Secondary | ICD-10-CM | POA: Diagnosis not present

## 2015-05-27 DIAGNOSIS — M5136 Other intervertebral disc degeneration, lumbar region: Secondary | ICD-10-CM | POA: Diagnosis not present

## 2015-05-30 DIAGNOSIS — Z7984 Long term (current) use of oral hypoglycemic drugs: Secondary | ICD-10-CM | POA: Diagnosis not present

## 2015-05-30 DIAGNOSIS — E7800 Pure hypercholesterolemia, unspecified: Secondary | ICD-10-CM | POA: Diagnosis not present

## 2015-05-30 DIAGNOSIS — E119 Type 2 diabetes mellitus without complications: Secondary | ICD-10-CM | POA: Diagnosis not present

## 2015-05-30 DIAGNOSIS — Z86010 Personal history of colonic polyps: Secondary | ICD-10-CM | POA: Diagnosis not present

## 2015-05-30 DIAGNOSIS — M5136 Other intervertebral disc degeneration, lumbar region: Secondary | ICD-10-CM | POA: Diagnosis not present

## 2015-05-30 DIAGNOSIS — M545 Low back pain: Secondary | ICD-10-CM | POA: Diagnosis not present

## 2015-05-30 DIAGNOSIS — Z79899 Other long term (current) drug therapy: Secondary | ICD-10-CM | POA: Diagnosis not present

## 2015-05-30 DIAGNOSIS — Z7982 Long term (current) use of aspirin: Secondary | ICD-10-CM | POA: Diagnosis not present

## 2015-05-30 DIAGNOSIS — M47816 Spondylosis without myelopathy or radiculopathy, lumbar region: Secondary | ICD-10-CM | POA: Diagnosis not present

## 2015-05-30 DIAGNOSIS — Z794 Long term (current) use of insulin: Secondary | ICD-10-CM | POA: Diagnosis not present

## 2015-06-26 ENCOUNTER — Other Ambulatory Visit: Payer: Self-pay | Admitting: Family Medicine

## 2015-07-09 DIAGNOSIS — Z7984 Long term (current) use of oral hypoglycemic drugs: Secondary | ICD-10-CM | POA: Diagnosis not present

## 2015-07-09 DIAGNOSIS — E119 Type 2 diabetes mellitus without complications: Secondary | ICD-10-CM | POA: Diagnosis not present

## 2015-07-09 DIAGNOSIS — M5136 Other intervertebral disc degeneration, lumbar region: Secondary | ICD-10-CM | POA: Diagnosis not present

## 2015-07-09 DIAGNOSIS — Z79899 Other long term (current) drug therapy: Secondary | ICD-10-CM | POA: Diagnosis not present

## 2015-07-09 DIAGNOSIS — Z888 Allergy status to other drugs, medicaments and biological substances status: Secondary | ICD-10-CM | POA: Diagnosis not present

## 2015-07-09 DIAGNOSIS — M47816 Spondylosis without myelopathy or radiculopathy, lumbar region: Secondary | ICD-10-CM | POA: Diagnosis not present

## 2015-07-09 DIAGNOSIS — Z7982 Long term (current) use of aspirin: Secondary | ICD-10-CM | POA: Diagnosis not present

## 2015-07-09 DIAGNOSIS — Z794 Long term (current) use of insulin: Secondary | ICD-10-CM | POA: Diagnosis not present

## 2015-07-15 ENCOUNTER — Other Ambulatory Visit: Payer: Self-pay | Admitting: Family Medicine

## 2015-07-24 ENCOUNTER — Other Ambulatory Visit: Payer: Self-pay | Admitting: Family Medicine

## 2015-07-26 ENCOUNTER — Other Ambulatory Visit: Payer: Self-pay | Admitting: Family Medicine

## 2015-08-02 ENCOUNTER — Telehealth: Payer: Self-pay | Admitting: Family Medicine

## 2015-08-02 DIAGNOSIS — E785 Hyperlipidemia, unspecified: Secondary | ICD-10-CM

## 2015-08-02 DIAGNOSIS — Z79899 Other long term (current) drug therapy: Secondary | ICD-10-CM

## 2015-08-02 DIAGNOSIS — E119 Type 2 diabetes mellitus without complications: Secondary | ICD-10-CM

## 2015-08-02 NOTE — Telephone Encounter (Signed)
Hemoglobin A1c, urine ACR, metabolic 7, lipid, liver

## 2015-08-02 NOTE — Telephone Encounter (Signed)
Blood work ordered in EPIC. Patient notified. 

## 2015-08-02 NOTE — Telephone Encounter (Signed)
bw orders please for appt 3/2  Last labs 12/20/14  Lip, A1C, PSA, Hep, BMP

## 2015-08-07 ENCOUNTER — Other Ambulatory Visit: Payer: Self-pay | Admitting: *Deleted

## 2015-08-07 MED ORDER — CARVEDILOL 12.5 MG PO TABS: ORAL_TABLET | ORAL | Status: DC

## 2015-08-13 DIAGNOSIS — E785 Hyperlipidemia, unspecified: Secondary | ICD-10-CM | POA: Diagnosis not present

## 2015-08-13 DIAGNOSIS — Z79899 Other long term (current) drug therapy: Secondary | ICD-10-CM | POA: Diagnosis not present

## 2015-08-13 DIAGNOSIS — E119 Type 2 diabetes mellitus without complications: Secondary | ICD-10-CM | POA: Diagnosis not present

## 2015-08-14 LAB — HEMOGLOBIN A1C
Est. average glucose Bld gHb Est-mCnc: 160 mg/dL
HEMOGLOBIN A1C: 7.2 % — AB (ref 4.8–5.6)

## 2015-08-14 LAB — MICROALBUMIN / CREATININE URINE RATIO
CREATININE, UR: 69.7 mg/dL
MICROALB/CREAT RATIO: 209.3 mg/g{creat} — AB (ref 0.0–30.0)
Microalbumin, Urine: 145.9 ug/mL

## 2015-08-14 LAB — BASIC METABOLIC PANEL
BUN/Creatinine Ratio: 11 (ref 10–22)
BUN: 11 mg/dL (ref 8–27)
CALCIUM: 9.3 mg/dL (ref 8.6–10.2)
CHLORIDE: 104 mmol/L (ref 96–106)
CO2: 22 mmol/L (ref 18–29)
Creatinine, Ser: 1.01 mg/dL (ref 0.76–1.27)
GFR calc Af Amer: 86 mL/min/{1.73_m2} (ref >59)
GFR calc non Af Amer: 74 mL/min/{1.73_m2} (ref >59)
GLUCOSE: 140 mg/dL — AB (ref 65–99)
POTASSIUM: 4.4 mmol/L (ref 3.5–5.2)
Sodium: 144 mmol/L (ref 134–144)

## 2015-08-14 LAB — HEPATIC FUNCTION PANEL
ALT: 24 IU/L (ref 0–44)
AST: 28 IU/L (ref 0–40)
Albumin: 4.3 g/dL (ref 3.5–4.8)
Alkaline Phosphatase: 60 IU/L (ref 39–117)
Bilirubin Total: 0.3 mg/dL (ref 0.0–1.2)
Bilirubin, Direct: 0.12 mg/dL (ref 0.00–0.40)
Total Protein: 6.8 g/dL (ref 6.0–8.5)

## 2015-08-14 LAB — LIPID PANEL
CHOL/HDL RATIO: 5 ratio (ref 0.0–5.0)
Cholesterol, Total: 165 mg/dL (ref 100–199)
HDL: 33 mg/dL — AB (ref >39)
LDL CALC: 60 mg/dL (ref 0–99)
TRIGLYCERIDES: 361 mg/dL — AB (ref 0–149)
VLDL CHOLESTEROL CAL: 72 mg/dL — AB (ref 5–40)

## 2015-08-15 ENCOUNTER — Encounter: Payer: Self-pay | Admitting: Family Medicine

## 2015-08-15 ENCOUNTER — Ambulatory Visit (INDEPENDENT_AMBULATORY_CARE_PROVIDER_SITE_OTHER): Payer: Medicare Other | Admitting: Family Medicine

## 2015-08-15 VITALS — BP 136/88 | Ht 74.0 in | Wt 286.0 lb

## 2015-08-15 DIAGNOSIS — E119 Type 2 diabetes mellitus without complications: Secondary | ICD-10-CM

## 2015-08-15 DIAGNOSIS — I10 Essential (primary) hypertension: Secondary | ICD-10-CM

## 2015-08-15 DIAGNOSIS — Z794 Long term (current) use of insulin: Secondary | ICD-10-CM

## 2015-08-15 DIAGNOSIS — E1159 Type 2 diabetes mellitus with other circulatory complications: Secondary | ICD-10-CM | POA: Diagnosis not present

## 2015-08-15 DIAGNOSIS — E1165 Type 2 diabetes mellitus with hyperglycemia: Secondary | ICD-10-CM

## 2015-08-15 DIAGNOSIS — IMO0001 Reserved for inherently not codable concepts without codable children: Secondary | ICD-10-CM | POA: Insufficient documentation

## 2015-08-15 DIAGNOSIS — IMO0002 Reserved for concepts with insufficient information to code with codable children: Secondary | ICD-10-CM

## 2015-08-15 MED ORDER — AMLODIPINE BESYLATE 10 MG PO TABS: 10.0000 mg | ORAL_TABLET | Freq: Every day | ORAL | Status: DC

## 2015-08-15 MED ORDER — LISINOPRIL 40 MG PO TABS: 40.0000 mg | ORAL_TABLET | Freq: Every day | ORAL | Status: DC

## 2015-08-15 MED ORDER — CARVEDILOL 12.5 MG PO TABS: ORAL_TABLET | ORAL | Status: DC

## 2015-08-15 MED ORDER — METFORMIN HCL 1000 MG PO TABS: ORAL_TABLET | ORAL | Status: DC

## 2015-08-15 MED ORDER — PRAVASTATIN SODIUM 40 MG PO TABS: ORAL_TABLET | ORAL | Status: DC

## 2015-08-15 MED ORDER — CANAGLIFLOZIN 300 MG PO TABS: ORAL_TABLET | ORAL | Status: DC

## 2015-08-15 NOTE — Progress Notes (Signed)
   Subjective:    Patient ID: Francisco Powell, male    DOB: 09-01-1940, 75 y.o.   MRN: [id number]  Diabetes He presents for his follow-up diabetic visit. He has type 2 diabetes mellitus. Risk factors for coronary artery disease include diabetes mellitus, dyslipidemia, hypertension, obesity and sedentary lifestyle. Current diabetic treatment includes insulin injections and oral agent (dual therapy). He is compliant with treatment all of the time. His weight is stable. He is following a diabetic diet. Eye exam current: has one this month.   Discuss recent lab results  patient states he is taking blood pressure medicine on a regular basis states tolerating it well Takes his cholesterol medicine on a regular basis tolerates Takes his insulin does not have any low sugar spells Tolerating the metformin on a regular basis   patient does take pain medicine as prescribed by pain specialist with Dr. [name]  Review of Systems  patient denies chest tightness pressure pain shortness breath nausea vomiting diarrhea. Relates severe back pain that pain medicine does help with.    Objective:   Physical Exam  lungs clear hearts regular pulse normal abdomen soft obese extremities no edema diabetic foot exam completed no neuropathy   25 minutes was spent with the patient. Greater than half the time was spent in discussion and answering questions and counseling regarding the issues that the patient came in for today.     Assessment & Plan:  diabetes-try to keep A1c as best as possible Heart disease related to the diabetes. Keeping diabetes under good control will help this Hyperlipidemia continue current measures Chronic back pain treated through specialist

## 2015-08-21 DIAGNOSIS — M47816 Spondylosis without myelopathy or radiculopathy, lumbar region: Secondary | ICD-10-CM | POA: Diagnosis not present

## 2015-08-21 DIAGNOSIS — M545 Low back pain: Secondary | ICD-10-CM | POA: Diagnosis not present

## 2015-08-21 DIAGNOSIS — M461 Sacroiliitis, not elsewhere classified: Secondary | ICD-10-CM | POA: Diagnosis not present

## 2015-08-21 DIAGNOSIS — M5136 Other intervertebral disc degeneration, lumbar region: Secondary | ICD-10-CM | POA: Diagnosis not present

## 2015-08-22 ENCOUNTER — Other Ambulatory Visit (HOSPITAL_COMMUNITY): Payer: Self-pay | Admitting: Pulmonary Disease

## 2015-08-22 DIAGNOSIS — J61 Pneumoconiosis due to asbestos and other mineral fibers: Secondary | ICD-10-CM

## 2015-08-23 ENCOUNTER — Other Ambulatory Visit (HOSPITAL_COMMUNITY): Payer: Self-pay | Admitting: Respiratory Therapy

## 2015-08-23 DIAGNOSIS — J61 Pneumoconiosis due to asbestos and other mineral fibers: Secondary | ICD-10-CM

## 2015-08-26 ENCOUNTER — Telehealth: Payer: Self-pay | Admitting: Family Medicine

## 2015-08-26 MED ORDER — LANTUS 100 UNIT/ML ~~LOC~~ SOLN: SUBCUTANEOUS | Status: DC

## 2015-08-26 MED ORDER — INSULIN ASPART 100 UNIT/ML FLEXPEN: PEN_INJECTOR | SUBCUTANEOUS | Status: DC

## 2015-08-26 NOTE — Telephone Encounter (Signed)
Rx sent electronically to pharmacy. Patient notified. 

## 2015-08-26 NOTE — Telephone Encounter (Signed)
NOVOLOG FLEXPEN 100 UNIT/ML FlexPen LANTUS 100 UNIT/ML injection  Pt is calling to say that he needs these meds filled  Was here on the 2nd other were refilled but these seemed to be forgotten   Please send today pt is out

## 2015-08-28 ENCOUNTER — Ambulatory Visit (HOSPITAL_COMMUNITY)
Admission: RE | Admit: 2015-08-28 | Discharge: 2015-08-28 | Disposition: A | Discharge status: Home / Self Care | Payer: Medicare Other | Source: Ambulatory Visit | Attending: Pulmonary Disease | Admitting: Pulmonary Disease

## 2015-08-28 DIAGNOSIS — I517 Cardiomegaly: Secondary | ICD-10-CM | POA: Insufficient documentation

## 2015-08-28 DIAGNOSIS — J61 Pneumoconiosis due to asbestos and other mineral fibers: Secondary | ICD-10-CM | POA: Diagnosis not present

## 2015-08-28 DIAGNOSIS — M4316 Spondylolisthesis, lumbar region: Secondary | ICD-10-CM | POA: Diagnosis not present

## 2015-08-28 DIAGNOSIS — I2510 Atherosclerotic heart disease of native coronary artery without angina pectoris: Secondary | ICD-10-CM | POA: Insufficient documentation

## 2015-08-28 DIAGNOSIS — K760 Fatty (change of) liver, not elsewhere classified: Secondary | ICD-10-CM | POA: Insufficient documentation

## 2015-08-28 DIAGNOSIS — M5136 Other intervertebral disc degeneration, lumbar region: Secondary | ICD-10-CM | POA: Diagnosis not present

## 2015-08-28 DIAGNOSIS — J9811 Atelectasis: Secondary | ICD-10-CM | POA: Diagnosis not present

## 2015-08-28 DIAGNOSIS — M7138 Other bursal cyst, other site: Secondary | ICD-10-CM | POA: Diagnosis not present

## 2015-08-28 MED ORDER — IOHEXOL 300 MG/ML  SOLN
75.0000 mL | Freq: Once | INTRAMUSCULAR | Status: AC | PRN
  Administered 2015-08-28: 75 mL via INTRAVENOUS

## 2015-08-30 DIAGNOSIS — H35372 Puckering of macula, left eye: Secondary | ICD-10-CM | POA: Diagnosis not present

## 2015-08-30 DIAGNOSIS — H52223 Regular astigmatism, bilateral: Secondary | ICD-10-CM | POA: Diagnosis not present

## 2015-08-30 DIAGNOSIS — H5203 Hypermetropia, bilateral: Secondary | ICD-10-CM | POA: Diagnosis not present

## 2015-08-30 DIAGNOSIS — H524 Presbyopia: Secondary | ICD-10-CM | POA: Diagnosis not present

## 2015-08-30 LAB — HM DIABETES EYE EXAM

## 2015-09-02 DIAGNOSIS — Z6836 Body mass index (BMI) 36.0-36.9, adult: Secondary | ICD-10-CM | POA: Diagnosis not present

## 2015-09-02 DIAGNOSIS — E119 Type 2 diabetes mellitus without complications: Secondary | ICD-10-CM | POA: Diagnosis not present

## 2015-09-02 DIAGNOSIS — Z7984 Long term (current) use of oral hypoglycemic drugs: Secondary | ICD-10-CM | POA: Diagnosis not present

## 2015-09-02 DIAGNOSIS — Z87442 Personal history of urinary calculi: Secondary | ICD-10-CM | POA: Diagnosis not present

## 2015-09-02 DIAGNOSIS — M461 Sacroiliitis, not elsewhere classified: Secondary | ICD-10-CM | POA: Diagnosis not present

## 2015-09-02 DIAGNOSIS — Z8249 Family history of ischemic heart disease and other diseases of the circulatory system: Secondary | ICD-10-CM | POA: Diagnosis not present

## 2015-09-02 DIAGNOSIS — Z809 Family history of malignant neoplasm, unspecified: Secondary | ICD-10-CM | POA: Diagnosis not present

## 2015-09-02 DIAGNOSIS — M5136 Other intervertebral disc degeneration, lumbar region: Secondary | ICD-10-CM | POA: Diagnosis not present

## 2015-09-02 DIAGNOSIS — M47816 Spondylosis without myelopathy or radiculopathy, lumbar region: Secondary | ICD-10-CM | POA: Diagnosis not present

## 2015-09-02 DIAGNOSIS — Z833 Family history of diabetes mellitus: Secondary | ICD-10-CM | POA: Diagnosis not present

## 2015-09-02 DIAGNOSIS — Z7982 Long term (current) use of aspirin: Secondary | ICD-10-CM | POA: Diagnosis not present

## 2015-09-02 DIAGNOSIS — E7800 Pure hypercholesterolemia, unspecified: Secondary | ICD-10-CM | POA: Diagnosis not present

## 2015-09-02 DIAGNOSIS — Z86010 Personal history of colonic polyps: Secondary | ICD-10-CM | POA: Diagnosis not present

## 2015-09-02 DIAGNOSIS — M545 Low back pain: Secondary | ICD-10-CM | POA: Diagnosis not present

## 2015-09-02 DIAGNOSIS — Z79899 Other long term (current) drug therapy: Secondary | ICD-10-CM | POA: Diagnosis not present

## 2015-09-02 DIAGNOSIS — Z794 Long term (current) use of insulin: Secondary | ICD-10-CM | POA: Diagnosis not present

## 2015-09-02 DIAGNOSIS — E669 Obesity, unspecified: Secondary | ICD-10-CM | POA: Diagnosis not present

## 2015-09-02 DIAGNOSIS — Z888 Allergy status to other drugs, medicaments and biological substances status: Secondary | ICD-10-CM | POA: Diagnosis not present

## 2015-09-03 ENCOUNTER — Encounter: Payer: Self-pay | Admitting: *Deleted

## 2015-09-06 ENCOUNTER — Inpatient Hospital Stay (HOSPITAL_COMMUNITY): Admission: RE | Admit: 2015-09-06 | Payer: Medicare Other | Source: Ambulatory Visit

## 2015-09-11 ENCOUNTER — Ambulatory Visit (INDEPENDENT_AMBULATORY_CARE_PROVIDER_SITE_OTHER): Payer: Medicare Other | Admitting: Cardiology

## 2015-09-11 ENCOUNTER — Encounter: Payer: Self-pay | Admitting: Cardiology

## 2015-09-11 VITALS — BP 136/74 | HR 56 | Ht 74.0 in | Wt 288.0 lb

## 2015-09-11 DIAGNOSIS — I2510 Atherosclerotic heart disease of native coronary artery without angina pectoris: Secondary | ICD-10-CM | POA: Diagnosis not present

## 2015-09-11 DIAGNOSIS — I10 Essential (primary) hypertension: Secondary | ICD-10-CM | POA: Diagnosis not present

## 2015-09-11 DIAGNOSIS — E782 Mixed hyperlipidemia: Secondary | ICD-10-CM | POA: Diagnosis not present

## 2015-09-11 DIAGNOSIS — E118 Type 2 diabetes mellitus with unspecified complications: Secondary | ICD-10-CM | POA: Diagnosis not present

## 2015-09-11 DIAGNOSIS — Z794 Long term (current) use of insulin: Secondary | ICD-10-CM

## 2015-09-11 NOTE — Progress Notes (Signed)
Cardiology Office Note  Date: 09/11/2015   ID: Francisco, Powell [birth year], MRN [id number]  PCP: Francisco Lange, MD  Primary Cardiologist: Francisco Lesches, MD   Chief Complaint  Patient presents with  . Coronary Artery Disease    History of Present Illness: Francisco Powell is a 75 y.o. male last seen in September 2016. He presents today with his wife for a follow-up visit. Reports no angina symptoms or worsening shortness of breath. He is functionally limited by chronic back pain and leg weakness, uses a cane. He has not been able to exercise. He follows with Dr. Francesco Runner in the [facility] for pain management injections.  He continues to follow with Dr. Wolfgang Phoenix. Recent lab work is reviewed below. Recent LDL was 60.  I reviewed his medications. Cardiac regimen includes aspirin, Coreg, lisinopril, Norvasc, and Pravachol. He has not required any nitroglycerin.  Most recent coronary intervention was DES to the RCA and LAD in May 2015. We discussed considering a follow-up stress test within the next year or so.  Past Medical History  Diagnosis Date  . Coronary atherosclerosis of native coronary artery     DES RCA and staged DES LAD May 2015, LVEF 55%  . Hyperlipidemia   . Essential hypertension, benign   . Obesity   . Lumbar back pain     Spondylosis and spondylolisthesis   . Nephrolithiasis   . Asbestosis(501)   . Anemia, iron deficiency   . Hx of adenomatous colonic polyps   . Sciatica of right side   . Myocardial infarction ([place]) 2004  . Type II diabetes mellitus ([place])   . Gout   . Anxiety   . Kidney carcinoma ([place]) 1998  . NSTEMI (non-ST elevated myocardial infarction) [facility])     May 2015    Past Surgical History  Procedure Laterality Date  . Holmium laser application Right [id number]  . Cholecystectomy      Biliary dyskinesia  . Retinal detachment surgery Right 1998    Secondary to injury detached retina/cataracts  . Cystoscopy w/ litholapaxy / ehl  2009  . Appendectomy     . Tonsillectomy    . Colonoscopy  2009    Dr. Arnoldo Morale: normal  . Back surgery  X 4  . Lumbar disc surgery  X 3  . Posterior lumbar fusion  2014  . Eye surgery    . Tumor excision  1995  . Esophagogastroduodenoscopy  June 2011    Dr. Gala Powell: erosive reflux esophagitis, small hiatal hernia, +H.pylori gastritis  . Colonoscopy  June 2011    Dr. Gala Powell: normal rectum, left-sided diverticula  . Left heart catheterization with coronary angiogram N/A 11/09/2013    Procedure: LEFT HEART CATHETERIZATION WITH CORONARY ANGIOGRAM;  Surgeon: [name], MD;  Location: [facility] CATH LAB;  Service: Cardiovascular;  Laterality: N/A;  . Percutaneous coronary stent intervention (pci-s) N/A 11/10/2013    Procedure: PERCUTANEOUS CORONARY STENT INTERVENTION (PCI-S);  Surgeon: [name], MD;  Location: [facility] CATH LAB;  Service: Cardiovascular;  Laterality: N/A;  . Colonoscopy N/A 04/12/2015    Procedure: COLONOSCOPY;  Surgeon: [name], MD;  Location: [facility] ENDO SUITE;  Service: Endoscopy;  Laterality: N/A;  1115     Current Outpatient Prescriptions  Medication Sig Dispense Refill  . amLODipine (NORVASC) 10 MG tablet Take 1 tablet (10 mg total) by mouth daily. 90 tablet 2  . aspirin 81 MG tablet Take 81 mg by mouth daily.      . bisacodyl (  BISACODYL) 5 MG EC tablet Take 1 tablet (5 mg total) by mouth as directed. 6 tablet 0  . canagliflozin (INVOKANA) 300 MG TABS tablet TAKE (1) TABLET BY MOUTH ONCE DAILY. 30 tablet 5  . carvedilol (COREG) 12.5 MG tablet TAKE 1 TABLET BY MOUTH TWICE DAILY WITH MEALS. 60 tablet 6  . insulin aspart (NOVOLOG FLEXPEN) 100 UNIT/ML FlexPen INJECT 30 UNITS INTO THE SKIN TWICE DAILY. 15 mL 2  . LANTUS 100 UNIT/ML injection INJECT 100 UNITS INTO THE SKIN TWICE DAILY. 60 mL 2  . lisinopril (PRINIVIL,ZESTRIL) 40 MG tablet Take 1 tablet (40 mg total) by mouth daily. 90 tablet 3  . metFORMIN (GLUCOPHAGE) 1000 MG tablet TAKE 1 TABLET BY MOUTH TWICE DAILY WITH A MEAL. 180 tablet 3    . nitroGLYCERIN (NITROSTAT) 0.4 MG SL tablet Place 1 tablet (0.4 mg total) under the tongue every 5 (five) minutes x 3 doses as needed for chest pain. 25 tablet 4  . oxyCODONE-acetaminophen (PERCOCET) 10-325 MG per tablet Take 1 tablet by mouth every 4 (four) hours as needed for pain. Per DR ROY    . phosphate laxative (FLEET) 2.7-7.2 GM/15ML solution Take 15 mLs by mouth once. 45 mL 0  . pravastatin (PRAVACHOL) 40 MG tablet TAKE 1 TABLET BY MOUTH AT BEDTIME FOR CHOLESTEROL. 90 tablet 3   No current facility-administered medications for this visit.   Allergies:  Penicillins; Atorvastatin; Other; and Celexa   Social History: The patient  reports that he has never smoked. His smokeless tobacco use includes Chew. He reports that he does not drink alcohol or use illicit drugs.   ROS:  Please see the history of present illness. Otherwise, complete review of systems is positive for chronic lower back pain and leg weakness.  All other systems are reviewed and negative.   Physical Exam: VS:  BP 136/74 mmHg  Pulse 56  Ht 6\' 2"  (1.88 m)  Wt 288 lb (130.636 kg)  BMI 36.96 kg/m2  SpO2 92%, BMI Body mass index is 36.96 kg/(m^2).  Wt Readings from Last 3 Encounters:  09/11/15 288 lb (130.636 kg)  08/15/15 286 lb (129.729 kg)  04/04/15 292 lb 9.6 oz (132.722 kg)    Obese male, no distress.  HEENT: Conjunctiva and lids normal, oropharynx clear.  Neck: Supple, no elevated JVP, increased girth, no thyromegaly.  Lungs: Clear to auscultation, nonlabored breathing at rest.  Cardiac: Regular rate and rhythm, no S3 or significant systolic murmur, no pericardial rub.  Abdomen: Soft, nontender, bowel sounds present, no guarding or rebound.  Extremities: No pitting edema, distal pulses 2+.  Skin: Warm and dry.  Musculoskeletal: No kyphosis.  Neuropsychiatric: Alert and oriented x3, affect grossly appropriate.  ECG: I personally reviewed the prior tracing from 09/03/2014 which showed sinus  bradycardia with right bundle branch block and left anterior fascicular block.  Recent Labwork: 08/13/2015: ALT 24; AST 28; BUN 11; Creatinine, Ser 1.01; Potassium 4.4; Sodium 144     Component Value Date/Time   CHOL 165 08/13/2015 0818   CHOL 154 02/21/2014 0745   TRIG 361* 08/13/2015 0818   HDL 33* 08/13/2015 0818   HDL 34* 02/21/2014 0745   CHOLHDL 5.0 08/13/2015 0818   CHOLHDL 4.5 02/21/2014 0745   VLDL 55* 02/21/2014 0745   LDLCALC 60 08/13/2015 0818   LDLCALC 65 02/21/2014 0745    Other Studies Reviewed Today:  Echocardiogram 09/09/2011: Study Conclusions  - Left ventricle: The cavity size was at the upper limits of normal. Wall thickness was increased  in a pattern of mild LVH. Systolic function was normal. The estimated ejection fraction was in the range of 60% to 65%. Wall motion was normal; there were no regional wall motion abnormalities. Doppler parameters are consistent with abnormal left ventricular relaxation (grade 1 diastolic dysfunction). - Ventricular septum: Septal motion showed paradox. - Aorta: Aortic root dimension: 33mm (ED). - Aortic root: The aortic root was mildly dilated. - Mitral valve: Trivial regurgitation. - Left atrium: The atrium was mildly dilated. - Tricuspid valve: Mild regurgitation. - Pericardium, extracardiac: There was no pericardial effusion.  Lexiscan Myoview 09/04/2011: IMPRESSION: Abnormal Lexiscan Myoview as outlined in patient with reported history of CAD. There were no clearly diagnostic ST-segment abnormalities with right bundle branch block and left anterior fascicular block noted at baseline. Perfusion imaging shows evidence of soft tissue attenuation versus scar in the inferolateral wall, no definite ischemia however. LVEF is calculated at 42% with relatively diffuse hypokinesis.  Chest CT 08/28/2015: IMPRESSION: 1. No acute findings in the thorax. 2. There are no calcified pleural plaques to suggest  asbestos related pleural disease. 3. Additionally, there are no compelling findings to suggest interstitial lung disease such as asbestosis if there is persistent clinical concern for interstitial lung disease, repeat evaluation with high-resolution chest CT in 12 months could be considered. 4. Cardiomegaly. 5. Atherosclerosis, including left main and 3 vessel coronary artery disease. Assessment for potential risk factor modification, dietary therapy or pharmacologic therapy may be warranted, if clinically indicated. 6. Hepatic steatosis.  Assessment and Plan:  1. CAD status post DES to the RCA and LAD in May 2015. He reports no angina symptoms on medical therapy. No changes were made to current medical regimen. We will continue observation for now, possibly consider a follow-up stress test within the next year.  2. Hyperlipidemia, good control with LDL 60 on statin therapy.  3. Essential hypertension, adequately controlled today. No changes were made to current regimen.  4. Morbid obesity. Patient states that he has trouble with eating too much and not being active enough to keep his weight down. He is limited by chronic back pain and leg weakness.  5. Type 2 diabetes mellitus, followed by Dr. Wolfgang Phoenix. Recent hemoglobin A1c was 7.2.  Current medicines were reviewed with the patient today.   Orders Placed This Encounter  Procedures  . EKG 12-Lead    Disposition: FU with me in 6 months.   Signed, [name], MD, [facility] 09/11/2015 9:08 AM    [place] [facility] [address], [place], [place] [address] Phone: [phone]; Fax: [phone]

## 2015-09-11 NOTE — Patient Instructions (Signed)

## 2015-09-13 ENCOUNTER — Ambulatory Visit (HOSPITAL_COMMUNITY)
Admission: RE | Admit: 2015-09-13 | Discharge: 2015-09-13 | Disposition: A | Discharge status: Home / Self Care | Payer: Worker's Compensation | Source: Ambulatory Visit | Attending: Pulmonary Disease | Admitting: Pulmonary Disease

## 2015-09-13 DIAGNOSIS — J61 Pneumoconiosis due to asbestos and other mineral fibers: Secondary | ICD-10-CM | POA: Insufficient documentation

## 2015-09-13 MED ORDER — ALBUTEROL SULFATE (2.5 MG/3ML) 0.083% IN NEBU
2.5000 mg | INHALATION_SOLUTION | Freq: Once | RESPIRATORY_TRACT | Status: AC
  Administered 2015-09-13: 2.5 mg via RESPIRATORY_TRACT

## 2015-09-15 LAB — PULMONARY FUNCTION TEST
DL/VA % pred: 81 %
DL/VA: 3.92 ml/min/mmHg/L
DLCO UNC: 25.35 ml/min/mmHg
DLCO unc % pred: 67 %
FEF 25-75 Post: 2.21 L/sec
FEF 25-75 Pre: 2.14 L/sec
FEF2575-%Change-Post: 3 %
FEF2575-%Pred-Post: 80 %
FEF2575-%Pred-Pre: 77 %
FEV1-%CHANGE-POST: 0 %
FEV1-%PRED-PRE: 73 %
FEV1-%Pred-Post: 73 %
FEV1-POST: 2.71 L
FEV1-PRE: 2.72 L
FEV1FVC-%Change-Post: -1 %
FEV1FVC-%Pred-Pre: 103 %
FEV6-%Change-Post: 0 %
FEV6-%PRED-POST: 75 %
FEV6-%PRED-PRE: 75 %
FEV6-POST: 3.59 L
FEV6-PRE: 3.58 L
FEV6FVC-%CHANGE-POST: 0 %
FEV6FVC-%PRED-POST: 104 %
FEV6FVC-%PRED-PRE: 104 %
FVC-%Change-Post: 0 %
FVC-%PRED-POST: 71 %
FVC-%Pred-Pre: 71 %
FVC-POST: 3.62 L
FVC-Pre: 3.59 L
PRE FEV6/FVC RATIO: 100 %
Post FEV1/FVC ratio: 75 %
Post FEV6/FVC ratio: 99 %
Pre FEV1/FVC ratio: 76 %
RV % PRED: 174 %
RV: 4.74 L
TLC % PRED: 111 %
TLC: 8.77 L

## 2015-10-03 DIAGNOSIS — M5136 Other intervertebral disc degeneration, lumbar region: Secondary | ICD-10-CM | POA: Diagnosis not present

## 2015-10-03 DIAGNOSIS — M545 Low back pain: Secondary | ICD-10-CM | POA: Diagnosis not present

## 2015-10-03 DIAGNOSIS — M47816 Spondylosis without myelopathy or radiculopathy, lumbar region: Secondary | ICD-10-CM | POA: Diagnosis not present

## 2015-10-15 DIAGNOSIS — M7138 Other bursal cyst, other site: Secondary | ICD-10-CM | POA: Diagnosis not present

## 2015-10-15 DIAGNOSIS — M4316 Spondylolisthesis, lumbar region: Secondary | ICD-10-CM | POA: Diagnosis not present

## 2015-10-15 DIAGNOSIS — M47816 Spondylosis without myelopathy or radiculopathy, lumbar region: Secondary | ICD-10-CM | POA: Diagnosis not present

## 2015-10-15 DIAGNOSIS — B0229 Other postherpetic nervous system involvement: Secondary | ICD-10-CM | POA: Diagnosis not present

## 2015-10-23 ENCOUNTER — Other Ambulatory Visit: Payer: Self-pay | Admitting: Family Medicine

## 2015-12-02 ENCOUNTER — Other Ambulatory Visit: Payer: Self-pay | Admitting: Family Medicine

## 2015-12-20 DIAGNOSIS — M47816 Spondylosis without myelopathy or radiculopathy, lumbar region: Secondary | ICD-10-CM | POA: Diagnosis not present

## 2015-12-20 DIAGNOSIS — M4316 Spondylolisthesis, lumbar region: Secondary | ICD-10-CM | POA: Diagnosis not present

## 2015-12-20 DIAGNOSIS — Z981 Arthrodesis status: Secondary | ICD-10-CM | POA: Diagnosis not present

## 2015-12-20 DIAGNOSIS — I700 Atherosclerosis of aorta: Secondary | ICD-10-CM | POA: Diagnosis not present

## 2015-12-20 DIAGNOSIS — M545 Low back pain: Secondary | ICD-10-CM | POA: Diagnosis not present

## 2015-12-24 DIAGNOSIS — M47816 Spondylosis without myelopathy or radiculopathy, lumbar region: Secondary | ICD-10-CM | POA: Diagnosis not present

## 2015-12-24 DIAGNOSIS — M5136 Other intervertebral disc degeneration, lumbar region: Secondary | ICD-10-CM | POA: Diagnosis not present

## 2015-12-24 DIAGNOSIS — M4316 Spondylolisthesis, lumbar region: Secondary | ICD-10-CM | POA: Diagnosis not present

## 2015-12-26 ENCOUNTER — Other Ambulatory Visit: Payer: Self-pay | Admitting: Neurosurgery

## 2015-12-26 DIAGNOSIS — M47816 Spondylosis without myelopathy or radiculopathy, lumbar region: Secondary | ICD-10-CM

## 2015-12-31 ENCOUNTER — Other Ambulatory Visit: Payer: Self-pay | Admitting: Neurosurgery

## 2015-12-31 ENCOUNTER — Ambulatory Visit
Admission: RE | Admit: 2015-12-31 | Discharge: 2015-12-31 | Disposition: A | Discharge status: Home / Self Care | Payer: Medicare Other | Source: Ambulatory Visit | Attending: Neurosurgery | Admitting: Neurosurgery

## 2015-12-31 DIAGNOSIS — M47816 Spondylosis without myelopathy or radiculopathy, lumbar region: Secondary | ICD-10-CM

## 2015-12-31 DIAGNOSIS — M545 Low back pain: Secondary | ICD-10-CM | POA: Diagnosis not present

## 2015-12-31 MED ORDER — METHYLPREDNISOLONE ACETATE 40 MG/ML INJ SUSP (RADIOLOG
120.0000 mg | Freq: Once | INTRAMUSCULAR | Status: AC
  Administered 2015-12-31: 120 mg via EPIDURAL

## 2015-12-31 MED ORDER — IOPAMIDOL (ISOVUE-M 200) INJECTION 41%
1.0000 mL | Freq: Once | INTRAMUSCULAR | Status: AC
  Administered 2015-12-31: 1 mL via EPIDURAL

## 2015-12-31 NOTE — Progress Notes (Signed)
CBG is 272 pre-injection after taking own sugar pills, coke and crackers.  Brita Romp, RN

## 2015-12-31 NOTE — Discharge Instructions (Signed)

## 2016-01-07 ENCOUNTER — Encounter: Payer: Self-pay | Admitting: Family Medicine

## 2016-01-07 ENCOUNTER — Ambulatory Visit (INDEPENDENT_AMBULATORY_CARE_PROVIDER_SITE_OTHER): Payer: Medicare Other | Admitting: Family Medicine

## 2016-01-07 VITALS — BP 164/88 | Temp 97.7°F | Ht 74.0 in

## 2016-01-07 DIAGNOSIS — R972 Elevated prostate specific antigen [PSA]: Secondary | ICD-10-CM

## 2016-01-07 DIAGNOSIS — Z125 Encounter for screening for malignant neoplasm of prostate: Secondary | ICD-10-CM

## 2016-01-07 DIAGNOSIS — Z79899 Other long term (current) drug therapy: Secondary | ICD-10-CM

## 2016-01-07 DIAGNOSIS — I2510 Atherosclerotic heart disease of native coronary artery without angina pectoris: Secondary | ICD-10-CM | POA: Diagnosis not present

## 2016-01-07 DIAGNOSIS — I10 Essential (primary) hypertension: Secondary | ICD-10-CM | POA: Diagnosis not present

## 2016-01-07 DIAGNOSIS — IMO0002 Reserved for concepts with insufficient information to code with codable children: Secondary | ICD-10-CM

## 2016-01-07 DIAGNOSIS — E1165 Type 2 diabetes mellitus with hyperglycemia: Secondary | ICD-10-CM

## 2016-01-07 DIAGNOSIS — E1159 Type 2 diabetes mellitus with other circulatory complications: Secondary | ICD-10-CM | POA: Diagnosis not present

## 2016-01-07 DIAGNOSIS — R197 Diarrhea, unspecified: Secondary | ICD-10-CM

## 2016-01-07 DIAGNOSIS — Z794 Long term (current) use of insulin: Secondary | ICD-10-CM

## 2016-01-07 DIAGNOSIS — E785 Hyperlipidemia, unspecified: Secondary | ICD-10-CM | POA: Diagnosis not present

## 2016-01-07 NOTE — Progress Notes (Signed)
   Subjective:    Patient ID: Francisco Powell, male    DOB: 1941-05-23, 75 y.o.   MRN: [id number]  HPIFell a couple months ago. See Dr. [name] tomorrow. Patient again evaluated by Dr. [name] tomorrow Diarrhea for several days. Bad odor. Green/yellow in color. No blood. No mucus.  Severe foul odor diarrhea multiple times per day over the past several days has not been in the hospital recently. Has gone to multiple Dr. Paulene Floor and x-rays. Patient not on any antibiotics recently.  Review of Systems     Objective:   Physical Exam Lungs clear heart regular pulse normal abdomen soft extremities no edema       Assessment & Plan:  Severe diarrhea foul odor-check for C. Difficile check stool cultures await results also check metabolic 7 and CBCI do not find dehydration at this moment  Chronic health issues he has a follow-up office visit in August he will do his lab work  Severe back pain causing elevation blood pressure he'll be seen back specialist tomorrow

## 2016-01-08 ENCOUNTER — Other Ambulatory Visit: Payer: Self-pay | Admitting: Family Medicine

## 2016-01-08 DIAGNOSIS — E785 Hyperlipidemia, unspecified: Secondary | ICD-10-CM | POA: Diagnosis not present

## 2016-01-08 DIAGNOSIS — Z125 Encounter for screening for malignant neoplasm of prostate: Secondary | ICD-10-CM | POA: Diagnosis not present

## 2016-01-08 DIAGNOSIS — R197 Diarrhea, unspecified: Secondary | ICD-10-CM | POA: Diagnosis not present

## 2016-01-08 DIAGNOSIS — M4316 Spondylolisthesis, lumbar region: Secondary | ICD-10-CM | POA: Diagnosis not present

## 2016-01-08 DIAGNOSIS — M47816 Spondylosis without myelopathy or radiculopathy, lumbar region: Secondary | ICD-10-CM | POA: Diagnosis not present

## 2016-01-08 DIAGNOSIS — Z79899 Other long term (current) drug therapy: Secondary | ICD-10-CM | POA: Diagnosis not present

## 2016-01-08 DIAGNOSIS — I10 Essential (primary) hypertension: Secondary | ICD-10-CM | POA: Diagnosis not present

## 2016-01-08 DIAGNOSIS — E1165 Type 2 diabetes mellitus with hyperglycemia: Secondary | ICD-10-CM | POA: Diagnosis not present

## 2016-01-08 DIAGNOSIS — E1159 Type 2 diabetes mellitus with other circulatory complications: Secondary | ICD-10-CM | POA: Diagnosis not present

## 2016-01-08 DIAGNOSIS — M5136 Other intervertebral disc degeneration, lumbar region: Secondary | ICD-10-CM | POA: Diagnosis not present

## 2016-01-08 DIAGNOSIS — Z794 Long term (current) use of insulin: Secondary | ICD-10-CM | POA: Diagnosis not present

## 2016-01-09 LAB — BASIC METABOLIC PANEL
BUN / CREAT RATIO: 12 (ref 10–24)
BUN: 11 mg/dL (ref 8–27)
CO2: 22 mmol/L (ref 18–29)
CREATININE: 0.9 mg/dL (ref 0.76–1.27)
Calcium: 9.3 mg/dL (ref 8.6–10.2)
Chloride: 106 mmol/L (ref 96–106)
GFR, EST AFRICAN AMERICAN: 98 mL/min/{1.73_m2} (ref >59)
GFR, EST NON AFRICAN AMERICAN: 85 mL/min/{1.73_m2} (ref >59)
Glucose: 90 mg/dL (ref 65–99)
Potassium: 3.9 mmol/L (ref 3.5–5.2)
SODIUM: 148 mmol/L — AB (ref 134–144)

## 2016-01-09 LAB — CBC WITH DIFFERENTIAL/PLATELET
BASOS ABS: 0 10*3/uL (ref 0.0–0.2)
BASOS: 0 %
EOS (ABSOLUTE): 0.1 10*3/uL (ref 0.0–0.4)
EOS: 1 %
HEMATOCRIT: 43.5 % (ref 37.5–51.0)
HEMOGLOBIN: 13.9 g/dL (ref 12.6–17.7)
IMMATURE GRANS (ABS): 0.1 10*3/uL (ref 0.0–0.1)
Immature Granulocytes: 1 %
LYMPHS ABS: 2.4 10*3/uL (ref 0.7–3.1)
LYMPHS: 23 %
MCH: 25.9 pg — AB (ref 26.6–33.0)
MCHC: 32 g/dL (ref 31.5–35.7)
MCV: 81 fL (ref 79–97)
MONOCYTES: 11 %
Monocytes Absolute: 1.1 10*3/uL — ABNORMAL HIGH (ref 0.1–0.9)
NEUTROS ABS: 6.8 10*3/uL (ref 1.4–7.0)
Neutrophils: 64 %
Platelets: 264 10*3/uL (ref 150–379)
RBC: 5.36 x10E6/uL (ref 4.14–5.80)
RDW: 18 % — ABNORMAL HIGH (ref 12.3–15.4)
WBC: 10.5 10*3/uL (ref 3.4–10.8)

## 2016-01-09 LAB — LIPID PANEL
Chol/HDL Ratio: 4.5 ratio units (ref 0.0–5.0)
Cholesterol, Total: 162 mg/dL (ref 100–199)
HDL: 36 mg/dL — AB (ref >39)
LDL Calculated: 65 mg/dL (ref 0–99)
Triglycerides: 305 mg/dL — ABNORMAL HIGH (ref 0–149)
VLDL Cholesterol Cal: 61 mg/dL — ABNORMAL HIGH (ref 5–40)

## 2016-01-09 LAB — HEPATIC FUNCTION PANEL
ALBUMIN: 4.5 g/dL (ref 3.5–4.8)
ALK PHOS: 58 IU/L (ref 39–117)
ALT: 22 IU/L (ref 0–44)
AST: 37 IU/L (ref 0–40)
BILIRUBIN, DIRECT: 0.12 mg/dL (ref 0.00–0.40)
Bilirubin Total: 0.4 mg/dL (ref 0.0–1.2)
TOTAL PROTEIN: 7 g/dL (ref 6.0–8.5)

## 2016-01-09 LAB — PSA: PROSTATE SPECIFIC AG, SERUM: 4.7 ng/mL — AB (ref 0.0–4.0)

## 2016-01-09 LAB — HEMOGLOBIN A1C
Est. average glucose Bld gHb Est-mCnc: 143 mg/dL
Hgb A1c MFr Bld: 6.6 % — ABNORMAL HIGH (ref 4.8–5.6)

## 2016-01-09 LAB — CLOSTRIDIUM DIFFICILE BY PCR: Toxigenic C. Difficile by PCR: NEGATIVE

## 2016-01-09 NOTE — Addendum Note (Signed)
Addended by: Ofilia Neas R on: 01/09/2016 02:22 PM   Modules accepted: Orders

## 2016-01-13 LAB — STOOL CULTURE: E coli, Shiga toxin Assay: NEGATIVE

## 2016-01-14 DIAGNOSIS — R972 Elevated prostate specific antigen [PSA]: Secondary | ICD-10-CM | POA: Diagnosis not present

## 2016-01-15 DIAGNOSIS — Z01818 Encounter for other preprocedural examination: Secondary | ICD-10-CM | POA: Diagnosis not present

## 2016-01-15 DIAGNOSIS — Z981 Arthrodesis status: Secondary | ICD-10-CM | POA: Diagnosis not present

## 2016-01-15 LAB — PSA, TOTAL AND FREE
PSA, Free Pct: 18.3 %
PSA, Free: 0.77 ng/mL
Prostate Specific Ag, Serum: 4.2 ng/mL — ABNORMAL HIGH (ref 0.0–4.0)

## 2016-01-17 DIAGNOSIS — Z880 Allergy status to penicillin: Secondary | ICD-10-CM | POA: Diagnosis not present

## 2016-01-17 DIAGNOSIS — M4316 Spondylolisthesis, lumbar region: Secondary | ICD-10-CM | POA: Diagnosis not present

## 2016-01-17 DIAGNOSIS — Z79899 Other long term (current) drug therapy: Secondary | ICD-10-CM | POA: Diagnosis not present

## 2016-01-17 DIAGNOSIS — Z809 Family history of malignant neoplasm, unspecified: Secondary | ICD-10-CM | POA: Diagnosis not present

## 2016-01-17 DIAGNOSIS — E7800 Pure hypercholesterolemia, unspecified: Secondary | ICD-10-CM | POA: Diagnosis not present

## 2016-01-17 DIAGNOSIS — I252 Old myocardial infarction: Secondary | ICD-10-CM | POA: Diagnosis not present

## 2016-01-17 DIAGNOSIS — Z794 Long term (current) use of insulin: Secondary | ICD-10-CM | POA: Diagnosis not present

## 2016-01-17 DIAGNOSIS — I10 Essential (primary) hypertension: Secondary | ICD-10-CM | POA: Diagnosis not present

## 2016-01-17 DIAGNOSIS — E119 Type 2 diabetes mellitus without complications: Secondary | ICD-10-CM | POA: Diagnosis not present

## 2016-01-17 DIAGNOSIS — E785 Hyperlipidemia, unspecified: Secondary | ICD-10-CM | POA: Diagnosis present

## 2016-01-17 DIAGNOSIS — I2510 Atherosclerotic heart disease of native coronary artery without angina pectoris: Secondary | ICD-10-CM | POA: Diagnosis present

## 2016-01-17 DIAGNOSIS — Z79891 Long term (current) use of opiate analgesic: Secondary | ICD-10-CM | POA: Diagnosis not present

## 2016-01-17 DIAGNOSIS — F17200 Nicotine dependence, unspecified, uncomplicated: Secondary | ICD-10-CM | POA: Diagnosis not present

## 2016-01-17 DIAGNOSIS — M9689 Other intraoperative and postprocedural complications and disorders of the musculoskeletal system: Secondary | ICD-10-CM | POA: Diagnosis not present

## 2016-01-17 DIAGNOSIS — Z981 Arthrodesis status: Secondary | ICD-10-CM | POA: Diagnosis not present

## 2016-01-17 DIAGNOSIS — Y658 Other specified misadventures during surgical and medical care: Secondary | ICD-10-CM | POA: Diagnosis not present

## 2016-01-17 DIAGNOSIS — M069 Rheumatoid arthritis, unspecified: Secondary | ICD-10-CM | POA: Diagnosis not present

## 2016-01-17 DIAGNOSIS — E6609 Other obesity due to excess calories: Secondary | ICD-10-CM | POA: Diagnosis not present

## 2016-01-17 DIAGNOSIS — Z8249 Family history of ischemic heart disease and other diseases of the circulatory system: Secondary | ICD-10-CM | POA: Diagnosis not present

## 2016-01-17 DIAGNOSIS — Z7982 Long term (current) use of aspirin: Secondary | ICD-10-CM | POA: Diagnosis not present

## 2016-01-17 DIAGNOSIS — Z6836 Body mass index (BMI) 36.0-36.9, adult: Secondary | ICD-10-CM | POA: Diagnosis not present

## 2016-01-17 DIAGNOSIS — E669 Obesity, unspecified: Secondary | ICD-10-CM | POA: Diagnosis not present

## 2016-01-17 DIAGNOSIS — M47816 Spondylosis without myelopathy or radiculopathy, lumbar region: Secondary | ICD-10-CM | POA: Diagnosis not present

## 2016-01-17 DIAGNOSIS — Z888 Allergy status to other drugs, medicaments and biological substances status: Secondary | ICD-10-CM | POA: Diagnosis not present

## 2016-01-23 DIAGNOSIS — Z9181 History of falling: Secondary | ICD-10-CM | POA: Diagnosis not present

## 2016-01-23 DIAGNOSIS — I10 Essential (primary) hypertension: Secondary | ICD-10-CM | POA: Diagnosis not present

## 2016-01-23 DIAGNOSIS — I252 Old myocardial infarction: Secondary | ICD-10-CM | POA: Diagnosis not present

## 2016-01-23 DIAGNOSIS — Z4789 Encounter for other orthopedic aftercare: Secondary | ICD-10-CM | POA: Diagnosis not present

## 2016-01-23 DIAGNOSIS — M47816 Spondylosis without myelopathy or radiculopathy, lumbar region: Secondary | ICD-10-CM | POA: Diagnosis not present

## 2016-01-23 DIAGNOSIS — E7800 Pure hypercholesterolemia, unspecified: Secondary | ICD-10-CM | POA: Diagnosis not present

## 2016-01-23 DIAGNOSIS — Z7982 Long term (current) use of aspirin: Secondary | ICD-10-CM | POA: Diagnosis not present

## 2016-01-23 DIAGNOSIS — M4806 Spinal stenosis, lumbar region: Secondary | ICD-10-CM | POA: Diagnosis not present

## 2016-01-23 DIAGNOSIS — E119 Type 2 diabetes mellitus without complications: Secondary | ICD-10-CM | POA: Diagnosis not present

## 2016-01-23 DIAGNOSIS — Z794 Long term (current) use of insulin: Secondary | ICD-10-CM | POA: Diagnosis not present

## 2016-01-23 DIAGNOSIS — Z7984 Long term (current) use of oral hypoglycemic drugs: Secondary | ICD-10-CM | POA: Diagnosis not present

## 2016-01-27 DIAGNOSIS — Z981 Arthrodesis status: Secondary | ICD-10-CM | POA: Diagnosis not present

## 2016-01-27 DIAGNOSIS — M47816 Spondylosis without myelopathy or radiculopathy, lumbar region: Secondary | ICD-10-CM | POA: Diagnosis not present

## 2016-01-27 DIAGNOSIS — M4316 Spondylolisthesis, lumbar region: Secondary | ICD-10-CM | POA: Diagnosis not present

## 2016-01-28 DIAGNOSIS — I10 Essential (primary) hypertension: Secondary | ICD-10-CM | POA: Diagnosis not present

## 2016-01-28 DIAGNOSIS — M47816 Spondylosis without myelopathy or radiculopathy, lumbar region: Secondary | ICD-10-CM | POA: Diagnosis not present

## 2016-01-28 DIAGNOSIS — Z4789 Encounter for other orthopedic aftercare: Secondary | ICD-10-CM | POA: Diagnosis not present

## 2016-01-28 DIAGNOSIS — E7800 Pure hypercholesterolemia, unspecified: Secondary | ICD-10-CM | POA: Diagnosis not present

## 2016-01-28 DIAGNOSIS — E119 Type 2 diabetes mellitus without complications: Secondary | ICD-10-CM | POA: Diagnosis not present

## 2016-01-28 DIAGNOSIS — M4806 Spinal stenosis, lumbar region: Secondary | ICD-10-CM | POA: Diagnosis not present

## 2016-01-29 DIAGNOSIS — I10 Essential (primary) hypertension: Secondary | ICD-10-CM | POA: Diagnosis not present

## 2016-01-29 DIAGNOSIS — M47816 Spondylosis without myelopathy or radiculopathy, lumbar region: Secondary | ICD-10-CM | POA: Diagnosis not present

## 2016-01-29 DIAGNOSIS — E7800 Pure hypercholesterolemia, unspecified: Secondary | ICD-10-CM | POA: Diagnosis not present

## 2016-01-29 DIAGNOSIS — Z4789 Encounter for other orthopedic aftercare: Secondary | ICD-10-CM | POA: Diagnosis not present

## 2016-01-29 DIAGNOSIS — M4806 Spinal stenosis, lumbar region: Secondary | ICD-10-CM | POA: Diagnosis not present

## 2016-01-29 DIAGNOSIS — E119 Type 2 diabetes mellitus without complications: Secondary | ICD-10-CM | POA: Diagnosis not present

## 2016-01-31 DIAGNOSIS — E119 Type 2 diabetes mellitus without complications: Secondary | ICD-10-CM | POA: Diagnosis not present

## 2016-01-31 DIAGNOSIS — I10 Essential (primary) hypertension: Secondary | ICD-10-CM | POA: Diagnosis not present

## 2016-01-31 DIAGNOSIS — M4806 Spinal stenosis, lumbar region: Secondary | ICD-10-CM | POA: Diagnosis not present

## 2016-01-31 DIAGNOSIS — E7800 Pure hypercholesterolemia, unspecified: Secondary | ICD-10-CM | POA: Diagnosis not present

## 2016-01-31 DIAGNOSIS — M47816 Spondylosis without myelopathy or radiculopathy, lumbar region: Secondary | ICD-10-CM | POA: Diagnosis not present

## 2016-01-31 DIAGNOSIS — Z4789 Encounter for other orthopedic aftercare: Secondary | ICD-10-CM | POA: Diagnosis not present

## 2016-02-03 DIAGNOSIS — I10 Essential (primary) hypertension: Secondary | ICD-10-CM | POA: Diagnosis not present

## 2016-02-03 DIAGNOSIS — Z4789 Encounter for other orthopedic aftercare: Secondary | ICD-10-CM | POA: Diagnosis not present

## 2016-02-03 DIAGNOSIS — E7800 Pure hypercholesterolemia, unspecified: Secondary | ICD-10-CM | POA: Diagnosis not present

## 2016-02-03 DIAGNOSIS — M47816 Spondylosis without myelopathy or radiculopathy, lumbar region: Secondary | ICD-10-CM | POA: Diagnosis not present

## 2016-02-03 DIAGNOSIS — M4806 Spinal stenosis, lumbar region: Secondary | ICD-10-CM | POA: Diagnosis not present

## 2016-02-03 DIAGNOSIS — E119 Type 2 diabetes mellitus without complications: Secondary | ICD-10-CM | POA: Diagnosis not present

## 2016-02-05 DIAGNOSIS — I10 Essential (primary) hypertension: Secondary | ICD-10-CM | POA: Diagnosis not present

## 2016-02-05 DIAGNOSIS — Z4789 Encounter for other orthopedic aftercare: Secondary | ICD-10-CM | POA: Diagnosis not present

## 2016-02-05 DIAGNOSIS — E7800 Pure hypercholesterolemia, unspecified: Secondary | ICD-10-CM | POA: Diagnosis not present

## 2016-02-05 DIAGNOSIS — E119 Type 2 diabetes mellitus without complications: Secondary | ICD-10-CM | POA: Diagnosis not present

## 2016-02-05 DIAGNOSIS — M47816 Spondylosis without myelopathy or radiculopathy, lumbar region: Secondary | ICD-10-CM | POA: Diagnosis not present

## 2016-02-05 DIAGNOSIS — M4806 Spinal stenosis, lumbar region: Secondary | ICD-10-CM | POA: Diagnosis not present

## 2016-02-07 ENCOUNTER — Telehealth: Payer: Self-pay | Admitting: Family Medicine

## 2016-02-07 DIAGNOSIS — I10 Essential (primary) hypertension: Secondary | ICD-10-CM | POA: Diagnosis not present

## 2016-02-07 DIAGNOSIS — R972 Elevated prostate specific antigen [PSA]: Secondary | ICD-10-CM

## 2016-02-07 DIAGNOSIS — Z4789 Encounter for other orthopedic aftercare: Secondary | ICD-10-CM | POA: Diagnosis not present

## 2016-02-07 DIAGNOSIS — M47816 Spondylosis without myelopathy or radiculopathy, lumbar region: Secondary | ICD-10-CM | POA: Diagnosis not present

## 2016-02-07 DIAGNOSIS — E7800 Pure hypercholesterolemia, unspecified: Secondary | ICD-10-CM | POA: Diagnosis not present

## 2016-02-07 DIAGNOSIS — M4806 Spinal stenosis, lumbar region: Secondary | ICD-10-CM | POA: Diagnosis not present

## 2016-02-07 DIAGNOSIS — E119 Type 2 diabetes mellitus without complications: Secondary | ICD-10-CM | POA: Diagnosis not present

## 2016-02-07 NOTE — Telephone Encounter (Signed)
Pt had to cancel appt for 02/12/16 - had back surgery recently and is not walking too good.  Wife states will call back to reschedule once he's getting around better   Wife wanted to know if there was anything on his recent labs that they may need to know about before he can get back in for visit  Please advise

## 2016-02-07 NOTE — Telephone Encounter (Signed)
Please let the wife know-lipid profile good liver profile good kidney functions good. PSA slightly elevated at 4.1. This will need further looking into by urology. But does not need to be done right away. The likelihood of this being prostate cancer is low. So therefore does not need to see urology right away. I would recommend to follow-up with [place] in 3-4 weeks. May be best to go ahead and set this appointment. Also I would recommend a consultation with [facility] later in the fall.-Nurses may go ahead and put in this referral-please let the family know I hope he feels better with his back surgery

## 2016-02-07 NOTE — Telephone Encounter (Signed)
Spoke with patient's wife and informed her per Dr.Scott Luking- lipid profile good liver profile good kidney functions good. PSA slightly elevated at 4.1. This will need further looking into by urology. But does not need to be done right away. The likelihood of this being prostate cancer is low. Dr.Scott  would recommend to follow-up with [place] in 3-4 weeks. May be best to go ahead and set this appointment. Also Dr.Scott would recommend a consultation with [facility] later in the fall. Patient's wife verbalized understanding and stated that she will call back when patient is more mobile to schedule a appointment. Referral for Urology to be entered in the system.

## 2016-02-10 DIAGNOSIS — E7800 Pure hypercholesterolemia, unspecified: Secondary | ICD-10-CM | POA: Diagnosis not present

## 2016-02-10 DIAGNOSIS — E119 Type 2 diabetes mellitus without complications: Secondary | ICD-10-CM | POA: Diagnosis not present

## 2016-02-10 DIAGNOSIS — M47816 Spondylosis without myelopathy or radiculopathy, lumbar region: Secondary | ICD-10-CM | POA: Diagnosis not present

## 2016-02-10 DIAGNOSIS — I10 Essential (primary) hypertension: Secondary | ICD-10-CM | POA: Diagnosis not present

## 2016-02-10 DIAGNOSIS — M4806 Spinal stenosis, lumbar region: Secondary | ICD-10-CM | POA: Diagnosis not present

## 2016-02-10 DIAGNOSIS — Z4789 Encounter for other orthopedic aftercare: Secondary | ICD-10-CM | POA: Diagnosis not present

## 2016-02-12 ENCOUNTER — Encounter: Payer: Self-pay | Admitting: Family Medicine

## 2016-02-12 ENCOUNTER — Ambulatory Visit: Payer: Medicare Other | Admitting: Family Medicine

## 2016-02-14 ENCOUNTER — Ambulatory Visit: Payer: Medicare Other | Admitting: Family Medicine

## 2016-02-14 DIAGNOSIS — E119 Type 2 diabetes mellitus without complications: Secondary | ICD-10-CM | POA: Diagnosis not present

## 2016-02-14 DIAGNOSIS — M47816 Spondylosis without myelopathy or radiculopathy, lumbar region: Secondary | ICD-10-CM | POA: Diagnosis not present

## 2016-02-14 DIAGNOSIS — Z4789 Encounter for other orthopedic aftercare: Secondary | ICD-10-CM | POA: Diagnosis not present

## 2016-02-14 DIAGNOSIS — E7800 Pure hypercholesterolemia, unspecified: Secondary | ICD-10-CM | POA: Diagnosis not present

## 2016-02-14 DIAGNOSIS — I10 Essential (primary) hypertension: Secondary | ICD-10-CM | POA: Diagnosis not present

## 2016-02-14 DIAGNOSIS — M4806 Spinal stenosis, lumbar region: Secondary | ICD-10-CM | POA: Diagnosis not present

## 2016-02-17 DIAGNOSIS — I10 Essential (primary) hypertension: Secondary | ICD-10-CM | POA: Diagnosis not present

## 2016-02-17 DIAGNOSIS — E7800 Pure hypercholesterolemia, unspecified: Secondary | ICD-10-CM | POA: Diagnosis not present

## 2016-02-17 DIAGNOSIS — M4806 Spinal stenosis, lumbar region: Secondary | ICD-10-CM | POA: Diagnosis not present

## 2016-02-17 DIAGNOSIS — E119 Type 2 diabetes mellitus without complications: Secondary | ICD-10-CM | POA: Diagnosis not present

## 2016-02-17 DIAGNOSIS — Z4789 Encounter for other orthopedic aftercare: Secondary | ICD-10-CM | POA: Diagnosis not present

## 2016-02-17 DIAGNOSIS — M47816 Spondylosis without myelopathy or radiculopathy, lumbar region: Secondary | ICD-10-CM | POA: Diagnosis not present

## 2016-02-19 DIAGNOSIS — E119 Type 2 diabetes mellitus without complications: Secondary | ICD-10-CM | POA: Diagnosis not present

## 2016-02-19 DIAGNOSIS — Z4789 Encounter for other orthopedic aftercare: Secondary | ICD-10-CM | POA: Diagnosis not present

## 2016-02-19 DIAGNOSIS — E7800 Pure hypercholesterolemia, unspecified: Secondary | ICD-10-CM | POA: Diagnosis not present

## 2016-02-19 DIAGNOSIS — M4806 Spinal stenosis, lumbar region: Secondary | ICD-10-CM | POA: Diagnosis not present

## 2016-02-19 DIAGNOSIS — M47816 Spondylosis without myelopathy or radiculopathy, lumbar region: Secondary | ICD-10-CM | POA: Diagnosis not present

## 2016-02-19 DIAGNOSIS — I10 Essential (primary) hypertension: Secondary | ICD-10-CM | POA: Diagnosis not present

## 2016-02-20 ENCOUNTER — Telehealth: Payer: Self-pay | Admitting: Family Medicine

## 2016-02-20 ENCOUNTER — Other Ambulatory Visit: Payer: Self-pay | Admitting: Family Medicine

## 2016-02-20 DIAGNOSIS — I10 Essential (primary) hypertension: Secondary | ICD-10-CM | POA: Diagnosis not present

## 2016-02-20 DIAGNOSIS — M47816 Spondylosis without myelopathy or radiculopathy, lumbar region: Secondary | ICD-10-CM | POA: Diagnosis not present

## 2016-02-20 DIAGNOSIS — M4806 Spinal stenosis, lumbar region: Secondary | ICD-10-CM | POA: Diagnosis not present

## 2016-02-20 DIAGNOSIS — E7800 Pure hypercholesterolemia, unspecified: Secondary | ICD-10-CM | POA: Diagnosis not present

## 2016-02-20 DIAGNOSIS — Z4789 Encounter for other orthopedic aftercare: Secondary | ICD-10-CM | POA: Diagnosis not present

## 2016-02-20 DIAGNOSIS — E119 Type 2 diabetes mellitus without complications: Secondary | ICD-10-CM | POA: Diagnosis not present

## 2016-02-20 NOTE — Telephone Encounter (Signed)
Discussed with pt's wife that he should be seen today at the ED with his symptoms.

## 2016-02-20 NOTE — Telephone Encounter (Signed)
Wife called stating that she thinks the pt may have a uti. Pt had surgery recently and is unable to ambulate to be seen. Wife wants to know if she can bring in a specimen and have [place] check it out without him being seen. Please advise.

## 2016-02-20 NOTE — Telephone Encounter (Signed)
Spoke with patient's wife and patient's wife stated that patient has been having symptoms of confusion and, decreased urine output. They would like to know if they could drop off a urine specimen to check for a uti due not being able to come in due to having recent back surgery. Please advise?

## 2016-02-20 NOTE — Telephone Encounter (Signed)
This is a worrisome combination. This patient needs to be seen. It would not be a good idea just to check the urine without being seen. This could be dehydration could be an infection. May well need to be seen at ER.

## 2016-02-24 DIAGNOSIS — I10 Essential (primary) hypertension: Secondary | ICD-10-CM | POA: Diagnosis not present

## 2016-02-24 DIAGNOSIS — Z4789 Encounter for other orthopedic aftercare: Secondary | ICD-10-CM | POA: Diagnosis not present

## 2016-02-24 DIAGNOSIS — M47816 Spondylosis without myelopathy or radiculopathy, lumbar region: Secondary | ICD-10-CM | POA: Diagnosis not present

## 2016-02-24 DIAGNOSIS — E7800 Pure hypercholesterolemia, unspecified: Secondary | ICD-10-CM | POA: Diagnosis not present

## 2016-02-24 DIAGNOSIS — M4806 Spinal stenosis, lumbar region: Secondary | ICD-10-CM | POA: Diagnosis not present

## 2016-02-24 DIAGNOSIS — E119 Type 2 diabetes mellitus without complications: Secondary | ICD-10-CM | POA: Diagnosis not present

## 2016-02-25 ENCOUNTER — Telehealth: Payer: Self-pay | Admitting: Internal Medicine

## 2016-02-25 NOTE — Telephone Encounter (Signed)
[phone]  Please call the patient she received a bill from an office visit last October and she does not know why since she is double covered.  She has questions regarding how the visit was coded and if that has affected it.

## 2016-02-25 NOTE — Telephone Encounter (Signed)
I sent an e-mail to our third party billing company in regards to the bill for dos: 04/04/15

## 2016-02-25 NOTE — Telephone Encounter (Signed)
I will call the patient once I receive a response from [employer]

## 2016-02-26 NOTE — Telephone Encounter (Signed)
Per Pamala Hurry, with our coding department Z codes for Medicare is not covered for an ov.  I have contacted [employer] in regards to the patient's bill

## 2016-02-26 NOTE — Telephone Encounter (Signed)
I spoke with Francisco Powell and the bill will be written off

## 2016-02-26 NOTE — Telephone Encounter (Signed)
Patient's wife made aware.

## 2016-02-27 DIAGNOSIS — E119 Type 2 diabetes mellitus without complications: Secondary | ICD-10-CM | POA: Diagnosis not present

## 2016-02-27 DIAGNOSIS — M4806 Spinal stenosis, lumbar region: Secondary | ICD-10-CM | POA: Diagnosis not present

## 2016-02-27 DIAGNOSIS — M47816 Spondylosis without myelopathy or radiculopathy, lumbar region: Secondary | ICD-10-CM | POA: Diagnosis not present

## 2016-02-27 DIAGNOSIS — I10 Essential (primary) hypertension: Secondary | ICD-10-CM | POA: Diagnosis not present

## 2016-02-27 DIAGNOSIS — Z4789 Encounter for other orthopedic aftercare: Secondary | ICD-10-CM | POA: Diagnosis not present

## 2016-02-27 DIAGNOSIS — E7800 Pure hypercholesterolemia, unspecified: Secondary | ICD-10-CM | POA: Diagnosis not present

## 2016-03-02 DIAGNOSIS — E7800 Pure hypercholesterolemia, unspecified: Secondary | ICD-10-CM | POA: Diagnosis not present

## 2016-03-02 DIAGNOSIS — I10 Essential (primary) hypertension: Secondary | ICD-10-CM | POA: Diagnosis not present

## 2016-03-02 DIAGNOSIS — Z4789 Encounter for other orthopedic aftercare: Secondary | ICD-10-CM | POA: Diagnosis not present

## 2016-03-02 DIAGNOSIS — M4806 Spinal stenosis, lumbar region: Secondary | ICD-10-CM | POA: Diagnosis not present

## 2016-03-02 DIAGNOSIS — E119 Type 2 diabetes mellitus without complications: Secondary | ICD-10-CM | POA: Diagnosis not present

## 2016-03-02 DIAGNOSIS — M47816 Spondylosis without myelopathy or radiculopathy, lumbar region: Secondary | ICD-10-CM | POA: Diagnosis not present

## 2016-03-05 DIAGNOSIS — M47816 Spondylosis without myelopathy or radiculopathy, lumbar region: Secondary | ICD-10-CM | POA: Diagnosis not present

## 2016-03-05 DIAGNOSIS — Z9889 Other specified postprocedural states: Secondary | ICD-10-CM | POA: Diagnosis not present

## 2016-03-06 DIAGNOSIS — Z4789 Encounter for other orthopedic aftercare: Secondary | ICD-10-CM | POA: Diagnosis not present

## 2016-03-06 DIAGNOSIS — E7800 Pure hypercholesterolemia, unspecified: Secondary | ICD-10-CM | POA: Diagnosis not present

## 2016-03-06 DIAGNOSIS — M47816 Spondylosis without myelopathy or radiculopathy, lumbar region: Secondary | ICD-10-CM | POA: Diagnosis not present

## 2016-03-06 DIAGNOSIS — M4806 Spinal stenosis, lumbar region: Secondary | ICD-10-CM | POA: Diagnosis not present

## 2016-03-06 DIAGNOSIS — E119 Type 2 diabetes mellitus without complications: Secondary | ICD-10-CM | POA: Diagnosis not present

## 2016-03-06 DIAGNOSIS — I10 Essential (primary) hypertension: Secondary | ICD-10-CM | POA: Diagnosis not present

## 2016-03-09 DIAGNOSIS — E7800 Pure hypercholesterolemia, unspecified: Secondary | ICD-10-CM | POA: Diagnosis not present

## 2016-03-09 DIAGNOSIS — E119 Type 2 diabetes mellitus without complications: Secondary | ICD-10-CM | POA: Diagnosis not present

## 2016-03-09 DIAGNOSIS — I10 Essential (primary) hypertension: Secondary | ICD-10-CM | POA: Diagnosis not present

## 2016-03-09 DIAGNOSIS — M47816 Spondylosis without myelopathy or radiculopathy, lumbar region: Secondary | ICD-10-CM | POA: Diagnosis not present

## 2016-03-09 DIAGNOSIS — Z4789 Encounter for other orthopedic aftercare: Secondary | ICD-10-CM | POA: Diagnosis not present

## 2016-03-09 DIAGNOSIS — M4806 Spinal stenosis, lumbar region: Secondary | ICD-10-CM | POA: Diagnosis not present

## 2016-03-11 ENCOUNTER — Ambulatory Visit: Payer: Medicare Other | Admitting: Cardiology

## 2016-03-11 DIAGNOSIS — M4806 Spinal stenosis, lumbar region: Secondary | ICD-10-CM | POA: Diagnosis not present

## 2016-03-11 DIAGNOSIS — E7800 Pure hypercholesterolemia, unspecified: Secondary | ICD-10-CM | POA: Diagnosis not present

## 2016-03-11 DIAGNOSIS — M47816 Spondylosis without myelopathy or radiculopathy, lumbar region: Secondary | ICD-10-CM | POA: Diagnosis not present

## 2016-03-11 DIAGNOSIS — Z4789 Encounter for other orthopedic aftercare: Secondary | ICD-10-CM | POA: Diagnosis not present

## 2016-03-11 DIAGNOSIS — E119 Type 2 diabetes mellitus without complications: Secondary | ICD-10-CM | POA: Diagnosis not present

## 2016-03-11 DIAGNOSIS — I10 Essential (primary) hypertension: Secondary | ICD-10-CM | POA: Diagnosis not present

## 2016-03-12 ENCOUNTER — Telehealth: Payer: Self-pay | Admitting: Family Medicine

## 2016-03-12 ENCOUNTER — Telehealth: Payer: Self-pay | Admitting: *Deleted

## 2016-03-12 DIAGNOSIS — M47816 Spondylosis without myelopathy or radiculopathy, lumbar region: Secondary | ICD-10-CM | POA: Diagnosis not present

## 2016-03-12 DIAGNOSIS — Z4789 Encounter for other orthopedic aftercare: Secondary | ICD-10-CM | POA: Diagnosis not present

## 2016-03-12 DIAGNOSIS — E7800 Pure hypercholesterolemia, unspecified: Secondary | ICD-10-CM | POA: Diagnosis not present

## 2016-03-12 DIAGNOSIS — E119 Type 2 diabetes mellitus without complications: Secondary | ICD-10-CM | POA: Diagnosis not present

## 2016-03-12 DIAGNOSIS — I10 Essential (primary) hypertension: Secondary | ICD-10-CM | POA: Diagnosis not present

## 2016-03-12 DIAGNOSIS — M4806 Spinal stenosis, lumbar region: Secondary | ICD-10-CM | POA: Diagnosis not present

## 2016-03-12 NOTE — Telephone Encounter (Signed)
Home health nurse given verbal orders for ulcer. Ok per dr Nicki Reaper.  See previous message. Pt has appt on tues with dr [name] at 1:15. Dr Nicki Reaper states he can go out on Friday oct 6th.

## 2016-03-12 NOTE — Telephone Encounter (Signed)
Please let them know that I'll be doing a home visit next week. Please set me up for 11:30 home visit either Tuesday or Thursday depending on current schedule

## 2016-03-12 NOTE — Telephone Encounter (Signed)
Appointment changed to 03/17/16 at 11:30

## 2016-03-12 NOTE — Telephone Encounter (Signed)
Discussed with pt's wife. 

## 2016-03-12 NOTE — Telephone Encounter (Signed)
[facility] called requesting a mild antidepressant. They are also wanting an order to paint his stage 2 pressure ulcer to the left ankle with betadine.

## 2016-03-12 NOTE — Telephone Encounter (Signed)
Home health and physical therapy was ordered by Dr [name]. Patient has a stage 2 ulcer on ankle and they want order to paint with betadine. Also the nurse feels the patient is depressed -he is refusing therapy and not wanting to do anything due to pain. Home health nurse feels like he needs a mild anti depressant.

## 2016-03-12 NOTE — Telephone Encounter (Signed)
Home visit scheduled for Thurs, Oct. 5 at 11:30.

## 2016-03-13 DIAGNOSIS — I10 Essential (primary) hypertension: Secondary | ICD-10-CM | POA: Diagnosis not present

## 2016-03-13 DIAGNOSIS — E119 Type 2 diabetes mellitus without complications: Secondary | ICD-10-CM | POA: Diagnosis not present

## 2016-03-13 DIAGNOSIS — E7800 Pure hypercholesterolemia, unspecified: Secondary | ICD-10-CM | POA: Diagnosis not present

## 2016-03-13 DIAGNOSIS — Z4789 Encounter for other orthopedic aftercare: Secondary | ICD-10-CM | POA: Diagnosis not present

## 2016-03-13 DIAGNOSIS — M4806 Spinal stenosis, lumbar region: Secondary | ICD-10-CM | POA: Diagnosis not present

## 2016-03-13 DIAGNOSIS — M47816 Spondylosis without myelopathy or radiculopathy, lumbar region: Secondary | ICD-10-CM | POA: Diagnosis not present

## 2016-03-13 NOTE — Telephone Encounter (Signed)
Patients wife said that someone was supposed to call something in for Francisco Powell, but pharmacy doesn't have any thing.  Please advise.   [employer]

## 2016-03-13 NOTE — Telephone Encounter (Signed)
Discussed with wife. She will wait until Dr. Nicki Reaper comes out for the home visit next week to discuss this with him.

## 2016-03-13 NOTE — Telephone Encounter (Signed)
Left message return call 03/13/16

## 2016-03-16 DIAGNOSIS — I10 Essential (primary) hypertension: Secondary | ICD-10-CM | POA: Diagnosis not present

## 2016-03-16 DIAGNOSIS — E119 Type 2 diabetes mellitus without complications: Secondary | ICD-10-CM | POA: Diagnosis not present

## 2016-03-16 DIAGNOSIS — E7800 Pure hypercholesterolemia, unspecified: Secondary | ICD-10-CM | POA: Diagnosis not present

## 2016-03-16 DIAGNOSIS — M47816 Spondylosis without myelopathy or radiculopathy, lumbar region: Secondary | ICD-10-CM | POA: Diagnosis not present

## 2016-03-16 DIAGNOSIS — Z4789 Encounter for other orthopedic aftercare: Secondary | ICD-10-CM | POA: Diagnosis not present

## 2016-03-16 DIAGNOSIS — M4806 Spinal stenosis, lumbar region: Secondary | ICD-10-CM | POA: Diagnosis not present

## 2016-03-17 ENCOUNTER — Ambulatory Visit: Payer: Medicare Other | Admitting: Family Medicine

## 2016-03-18 ENCOUNTER — Encounter: Payer: Self-pay | Admitting: Family Medicine

## 2016-03-18 ENCOUNTER — Ambulatory Visit: Payer: Medicare Other | Admitting: Family Medicine

## 2016-03-18 VITALS — BP 120/70

## 2016-03-18 DIAGNOSIS — R972 Elevated prostate specific antigen [PSA]: Secondary | ICD-10-CM

## 2016-03-18 DIAGNOSIS — E784 Other hyperlipidemia: Secondary | ICD-10-CM

## 2016-03-18 DIAGNOSIS — I2510 Atherosclerotic heart disease of native coronary artery without angina pectoris: Secondary | ICD-10-CM | POA: Diagnosis not present

## 2016-03-18 DIAGNOSIS — E1165 Type 2 diabetes mellitus with hyperglycemia: Secondary | ICD-10-CM

## 2016-03-18 DIAGNOSIS — Z794 Long term (current) use of insulin: Secondary | ICD-10-CM | POA: Diagnosis not present

## 2016-03-18 DIAGNOSIS — R29898 Other symptoms and signs involving the musculoskeletal system: Secondary | ICD-10-CM

## 2016-03-18 DIAGNOSIS — E1159 Type 2 diabetes mellitus with other circulatory complications: Secondary | ICD-10-CM | POA: Diagnosis not present

## 2016-03-18 DIAGNOSIS — IMO0002 Reserved for concepts with insufficient information to code with codable children: Secondary | ICD-10-CM

## 2016-03-18 DIAGNOSIS — F321 Major depressive disorder, single episode, moderate: Secondary | ICD-10-CM

## 2016-03-18 DIAGNOSIS — I10 Essential (primary) hypertension: Secondary | ICD-10-CM

## 2016-03-18 DIAGNOSIS — L89522 Pressure ulcer of left ankle, stage 2: Secondary | ICD-10-CM

## 2016-03-18 DIAGNOSIS — E7849 Other hyperlipidemia: Secondary | ICD-10-CM

## 2016-03-18 MED ORDER — INSULIN ASPART 100 UNIT/ML FLEXPEN: PEN_INJECTOR | SUBCUTANEOUS | 5 refills | Status: DC

## 2016-03-18 MED ORDER — INSULIN GLARGINE 100 UNIT/ML ~~LOC~~ SOLN: SUBCUTANEOUS | 6 refills | Status: DC

## 2016-03-18 MED ORDER — DULOXETINE HCL 30 MG PO CPEP: 30.0000 mg | ORAL_CAPSULE | Freq: Every day | ORAL | 5 refills | Status: DC

## 2016-03-18 MED ORDER — AMLODIPINE BESYLATE 10 MG PO TABS: 10.0000 mg | ORAL_TABLET | Freq: Every day | ORAL | 2 refills | Status: DC

## 2016-03-18 MED ORDER — CARVEDILOL 12.5 MG PO TABS: ORAL_TABLET | ORAL | 6 refills | Status: DC

## 2016-03-18 NOTE — Progress Notes (Signed)
Med called into pharm. Glucose sheets mailed to pt.

## 2016-03-18 NOTE — Progress Notes (Signed)
This patient was seen as part of a home visit. He is having some significant complex issues going on #1 recent back surgery-still having ongoing pain and discomfort have any use oxycodone 10 mg tablets sometimes a half a tablet sometimes a whole tablet relate some moderate amount of pain some radiation into the right leg. Some into his back also with a lot of back spasms have and use baclofen for this in addition to this he is also having a fair amount of right leg weakness #2 right leg weakness-this is more profound over the past several weeks. This limits his ability to get up and move around. Physical therapy is working with him. He has not shown much improvement recently because of the pain the patient was talked to at length about the importance of increasing activity and muscle strengthening #3 diabetes is on less insulin when he was on he does not eat as much. Has not had his good appetite denies any fever denies excessively high sugars or significantly low sugars no passing out spells. #4 blood pressure he takes his medicine is been under good control #5 takes his cholesterol medicine recent lab work good #6 recent lab work showed a slight elevation of PSA but patient unable to go to specialist currently beget this checked out because of recent surgery #7 patient having significant decreased moods having irritability feeling depressed sad at times denies any specific setbacks not suicidal. PMH multiple issues pertinent for a history of hyperlipidemia chronic lumbar pain history of heart attack diabetes anemia and significant obesity Medications were reviewed. Physical exam blood pressure is good patient has decreased affect lungs are clear no crackles heart is regular patient has pressure ulcer on the left ankle seems to be healing praised patient also has a small pressure ulcer on his but Rx at the midline region Patient has weakness of the right leg. Patient has good strength of the calf and  feet Patient has difficulty getting up and moving about with a walker. Assessment and plan #1 diabetes family will record glucose readings send them to [place] within the next couple weeks for review parameters regarding goals were discussed #2 blood pressure good control continue current measures #3 heart disease stable continue current measures #4 hyperlipidemia previous lab work looks good continue current measures #5 elevated PSA we will look into this further when the fall time patient unable to go to specialist currently because of his back situation #6 severe lower back pain with sciatica down the right leg #7 weakness of the right leg physical therapy pain medication as necessary hopefully patient will see improvement #8 pressure ulcer seems to be healing with treatment #9 major depression start Cymbalta 30 mg daily.  Complex patient significant time spent with patient greater than half in discussion  Face-to-face evaluation home health and physical therapy indicated

## 2016-03-19 ENCOUNTER — Ambulatory Visit: Payer: Medicare Other | Admitting: Family Medicine

## 2016-03-19 DIAGNOSIS — M47816 Spondylosis without myelopathy or radiculopathy, lumbar region: Secondary | ICD-10-CM | POA: Diagnosis not present

## 2016-03-19 DIAGNOSIS — Z4789 Encounter for other orthopedic aftercare: Secondary | ICD-10-CM | POA: Diagnosis not present

## 2016-03-19 DIAGNOSIS — E119 Type 2 diabetes mellitus without complications: Secondary | ICD-10-CM | POA: Diagnosis not present

## 2016-03-19 DIAGNOSIS — M4806 Spinal stenosis, lumbar region: Secondary | ICD-10-CM | POA: Diagnosis not present

## 2016-03-19 DIAGNOSIS — I10 Essential (primary) hypertension: Secondary | ICD-10-CM | POA: Diagnosis not present

## 2016-03-19 DIAGNOSIS — E7800 Pure hypercholesterolemia, unspecified: Secondary | ICD-10-CM | POA: Diagnosis not present

## 2016-03-20 ENCOUNTER — Ambulatory Visit: Payer: Medicare Other | Admitting: Family Medicine

## 2016-03-20 DIAGNOSIS — E119 Type 2 diabetes mellitus without complications: Secondary | ICD-10-CM | POA: Diagnosis not present

## 2016-03-20 DIAGNOSIS — I10 Essential (primary) hypertension: Secondary | ICD-10-CM | POA: Diagnosis not present

## 2016-03-20 DIAGNOSIS — E7800 Pure hypercholesterolemia, unspecified: Secondary | ICD-10-CM | POA: Diagnosis not present

## 2016-03-20 DIAGNOSIS — M4806 Spinal stenosis, lumbar region: Secondary | ICD-10-CM | POA: Diagnosis not present

## 2016-03-20 DIAGNOSIS — Z4789 Encounter for other orthopedic aftercare: Secondary | ICD-10-CM | POA: Diagnosis not present

## 2016-03-20 DIAGNOSIS — M47816 Spondylosis without myelopathy or radiculopathy, lumbar region: Secondary | ICD-10-CM | POA: Diagnosis not present

## 2016-03-23 ENCOUNTER — Telehealth: Payer: Self-pay | Admitting: Family Medicine

## 2016-03-23 DIAGNOSIS — I252 Old myocardial infarction: Secondary | ICD-10-CM | POA: Diagnosis not present

## 2016-03-23 DIAGNOSIS — Z9181 History of falling: Secondary | ICD-10-CM | POA: Diagnosis not present

## 2016-03-23 DIAGNOSIS — Z7982 Long term (current) use of aspirin: Secondary | ICD-10-CM | POA: Diagnosis not present

## 2016-03-23 DIAGNOSIS — E7800 Pure hypercholesterolemia, unspecified: Secondary | ICD-10-CM | POA: Diagnosis not present

## 2016-03-23 DIAGNOSIS — I10 Essential (primary) hypertension: Secondary | ICD-10-CM | POA: Diagnosis not present

## 2016-03-23 DIAGNOSIS — M48061 Spinal stenosis, lumbar region without neurogenic claudication: Secondary | ICD-10-CM | POA: Diagnosis not present

## 2016-03-23 DIAGNOSIS — Z794 Long term (current) use of insulin: Secondary | ICD-10-CM | POA: Diagnosis not present

## 2016-03-23 DIAGNOSIS — E119 Type 2 diabetes mellitus without complications: Secondary | ICD-10-CM | POA: Diagnosis not present

## 2016-03-23 DIAGNOSIS — M47816 Spondylosis without myelopathy or radiculopathy, lumbar region: Secondary | ICD-10-CM | POA: Diagnosis not present

## 2016-03-23 DIAGNOSIS — Z7984 Long term (current) use of oral hypoglycemic drugs: Secondary | ICD-10-CM | POA: Diagnosis not present

## 2016-03-23 DIAGNOSIS — L89522 Pressure ulcer of left ankle, stage 2: Secondary | ICD-10-CM | POA: Diagnosis not present

## 2016-03-23 NOTE — Telephone Encounter (Signed)
Francisco Powell, PT at [facility], called to let Dr. Nicki Reaper know that prescribing the patient Cymbalta has made a day and night difference.  He is much more mobile since Dr. Nicki Reaper put him on this medication and he wanted to thank Dr. Nicki Reaper personally for this.    Also, since he is doing so much better, he would like to extend PT 3 times weekly for 3 weeks.

## 2016-03-24 DIAGNOSIS — M47816 Spondylosis without myelopathy or radiculopathy, lumbar region: Secondary | ICD-10-CM | POA: Diagnosis not present

## 2016-03-24 DIAGNOSIS — E119 Type 2 diabetes mellitus without complications: Secondary | ICD-10-CM | POA: Diagnosis not present

## 2016-03-24 DIAGNOSIS — E7800 Pure hypercholesterolemia, unspecified: Secondary | ICD-10-CM | POA: Diagnosis not present

## 2016-03-24 DIAGNOSIS — M48061 Spinal stenosis, lumbar region without neurogenic claudication: Secondary | ICD-10-CM | POA: Diagnosis not present

## 2016-03-24 DIAGNOSIS — L89522 Pressure ulcer of left ankle, stage 2: Secondary | ICD-10-CM | POA: Diagnosis not present

## 2016-03-24 DIAGNOSIS — I10 Essential (primary) hypertension: Secondary | ICD-10-CM | POA: Diagnosis not present

## 2016-03-24 NOTE — Telephone Encounter (Signed)
I spoke with the patients wife and she believes he is able to come in the office in a month so we tentatively scheduled an appointment for April 22, 2016.  I explained to her that if any thing changed, DR. Nicki Reaper would be happy to do a home visit and she was very thankful for that.  Also,  Would you like the PT extended 3 times weekly for 3 weeks? If so, please forward to the nurse to give this order to [place] at [facility].

## 2016-03-24 NOTE — Telephone Encounter (Signed)
Notified Francisco Powell at [facility] please go ahead and extend physical therapy for the next 3 weeks. Dr Nicki Reaper did a face-to-face visit last week as part of a home visit. Francisco Powell verbalized understanding and sends his thanks.

## 2016-03-24 NOTE — Telephone Encounter (Signed)
Please go ahead and extend physical therapy for the next 3 weeks. I did a face-to-face visit last week as part of a home visit

## 2016-03-24 NOTE — Telephone Encounter (Signed)
I'm glad he is doing better. Please see if the patient can do a follow-up office visit in one month's time hopefully he can come to the office since he's doing better. If he is unable to come to the office closer to that time he can call us and I can see if I can do a home visit

## 2016-03-26 DIAGNOSIS — L89522 Pressure ulcer of left ankle, stage 2: Secondary | ICD-10-CM | POA: Diagnosis not present

## 2016-03-26 DIAGNOSIS — I10 Essential (primary) hypertension: Secondary | ICD-10-CM | POA: Diagnosis not present

## 2016-03-26 DIAGNOSIS — E7800 Pure hypercholesterolemia, unspecified: Secondary | ICD-10-CM | POA: Diagnosis not present

## 2016-03-26 DIAGNOSIS — M48061 Spinal stenosis, lumbar region without neurogenic claudication: Secondary | ICD-10-CM | POA: Diagnosis not present

## 2016-03-26 DIAGNOSIS — M47816 Spondylosis without myelopathy or radiculopathy, lumbar region: Secondary | ICD-10-CM | POA: Diagnosis not present

## 2016-03-26 DIAGNOSIS — E119 Type 2 diabetes mellitus without complications: Secondary | ICD-10-CM | POA: Diagnosis not present

## 2016-03-27 DIAGNOSIS — M47816 Spondylosis without myelopathy or radiculopathy, lumbar region: Secondary | ICD-10-CM | POA: Diagnosis not present

## 2016-03-27 DIAGNOSIS — I10 Essential (primary) hypertension: Secondary | ICD-10-CM | POA: Diagnosis not present

## 2016-03-27 DIAGNOSIS — E7800 Pure hypercholesterolemia, unspecified: Secondary | ICD-10-CM | POA: Diagnosis not present

## 2016-03-27 DIAGNOSIS — M48061 Spinal stenosis, lumbar region without neurogenic claudication: Secondary | ICD-10-CM | POA: Diagnosis not present

## 2016-03-27 DIAGNOSIS — E119 Type 2 diabetes mellitus without complications: Secondary | ICD-10-CM | POA: Diagnosis not present

## 2016-03-27 DIAGNOSIS — L89522 Pressure ulcer of left ankle, stage 2: Secondary | ICD-10-CM | POA: Diagnosis not present

## 2016-03-30 ENCOUNTER — Emergency Department (HOSPITAL_COMMUNITY): Payer: Medicare Other

## 2016-03-30 ENCOUNTER — Encounter (HOSPITAL_COMMUNITY): Payer: Self-pay | Admitting: Emergency Medicine

## 2016-03-30 ENCOUNTER — Inpatient Hospital Stay (HOSPITAL_COMMUNITY)
Admission: EM | Admit: 2016-03-30 | Discharge: 2016-04-03 | DRG: 689 | Disposition: A | Discharge status: Skilled Nursing Facility | Payer: Medicare Other | Attending: Internal Medicine | Admitting: Internal Medicine

## 2016-03-30 DIAGNOSIS — D72829 Elevated white blood cell count, unspecified: Secondary | ICD-10-CM | POA: Diagnosis not present

## 2016-03-30 DIAGNOSIS — M6281 Muscle weakness (generalized): Secondary | ICD-10-CM | POA: Diagnosis not present

## 2016-03-30 DIAGNOSIS — L89509 Pressure ulcer of unspecified ankle, unspecified stage: Secondary | ICD-10-CM | POA: Diagnosis present

## 2016-03-30 DIAGNOSIS — Z981 Arthrodesis status: Secondary | ICD-10-CM

## 2016-03-30 DIAGNOSIS — L89159 Pressure ulcer of sacral region, unspecified stage: Secondary | ICD-10-CM | POA: Diagnosis present

## 2016-03-30 DIAGNOSIS — D649 Anemia, unspecified: Secondary | ICD-10-CM | POA: Diagnosis not present

## 2016-03-30 DIAGNOSIS — IMO0001 Reserved for inherently not codable concepts without codable children: Secondary | ICD-10-CM

## 2016-03-30 DIAGNOSIS — Y92009 Unspecified place in unspecified non-institutional (private) residence as the place of occurrence of the external cause: Secondary | ICD-10-CM

## 2016-03-30 DIAGNOSIS — I2510 Atherosclerotic heart disease of native coronary artery without angina pectoris: Secondary | ICD-10-CM

## 2016-03-30 DIAGNOSIS — I452 Bifascicular block: Secondary | ICD-10-CM | POA: Diagnosis present

## 2016-03-30 DIAGNOSIS — R296 Repeated falls: Secondary | ICD-10-CM | POA: Diagnosis present

## 2016-03-30 DIAGNOSIS — S0990XA Unspecified injury of head, initial encounter: Secondary | ICD-10-CM | POA: Diagnosis not present

## 2016-03-30 DIAGNOSIS — G8929 Other chronic pain: Secondary | ICD-10-CM

## 2016-03-30 DIAGNOSIS — R531 Weakness: Secondary | ICD-10-CM

## 2016-03-30 DIAGNOSIS — E538 Deficiency of other specified B group vitamins: Secondary | ICD-10-CM | POA: Diagnosis present

## 2016-03-30 DIAGNOSIS — N3000 Acute cystitis without hematuria: Secondary | ICD-10-CM

## 2016-03-30 DIAGNOSIS — I252 Old myocardial infarction: Secondary | ICD-10-CM

## 2016-03-30 DIAGNOSIS — M545 Low back pain, unspecified: Secondary | ICD-10-CM

## 2016-03-30 DIAGNOSIS — W19XXXA Unspecified fall, initial encounter: Secondary | ICD-10-CM

## 2016-03-30 DIAGNOSIS — Z7982 Long term (current) use of aspirin: Secondary | ICD-10-CM

## 2016-03-30 DIAGNOSIS — Z794 Long term (current) use of insulin: Secondary | ICD-10-CM

## 2016-03-30 DIAGNOSIS — G912 (Idiopathic) normal pressure hydrocephalus: Secondary | ICD-10-CM

## 2016-03-30 DIAGNOSIS — R51 Headache: Secondary | ICD-10-CM | POA: Diagnosis not present

## 2016-03-30 DIAGNOSIS — F329 Major depressive disorder, single episode, unspecified: Secondary | ICD-10-CM | POA: Diagnosis present

## 2016-03-30 DIAGNOSIS — E876 Hypokalemia: Secondary | ICD-10-CM

## 2016-03-30 DIAGNOSIS — G9340 Encephalopathy, unspecified: Secondary | ICD-10-CM | POA: Diagnosis present

## 2016-03-30 DIAGNOSIS — Z8249 Family history of ischemic heart disease and other diseases of the circulatory system: Secondary | ICD-10-CM

## 2016-03-30 DIAGNOSIS — F17220 Nicotine dependence, chewing tobacco, uncomplicated: Secondary | ICD-10-CM | POA: Diagnosis present

## 2016-03-30 DIAGNOSIS — E119 Type 2 diabetes mellitus without complications: Secondary | ICD-10-CM

## 2016-03-30 DIAGNOSIS — K210 Gastro-esophageal reflux disease with esophagitis: Secondary | ICD-10-CM | POA: Diagnosis present

## 2016-03-30 DIAGNOSIS — R29898 Other symptoms and signs involving the musculoskeletal system: Secondary | ICD-10-CM

## 2016-03-30 DIAGNOSIS — Z87442 Personal history of urinary calculi: Secondary | ICD-10-CM

## 2016-03-30 DIAGNOSIS — M5441 Lumbago with sciatica, right side: Secondary | ICD-10-CM

## 2016-03-30 DIAGNOSIS — E860 Dehydration: Secondary | ICD-10-CM | POA: Diagnosis present

## 2016-03-30 DIAGNOSIS — Z85528 Personal history of other malignant neoplasm of kidney: Secondary | ICD-10-CM

## 2016-03-30 DIAGNOSIS — Z955 Presence of coronary angioplasty implant and graft: Secondary | ICD-10-CM

## 2016-03-30 DIAGNOSIS — Z803 Family history of malignant neoplasm of breast: Secondary | ICD-10-CM

## 2016-03-30 DIAGNOSIS — I10 Essential (primary) hypertension: Secondary | ICD-10-CM

## 2016-03-30 DIAGNOSIS — E785 Hyperlipidemia, unspecified: Secondary | ICD-10-CM | POA: Diagnosis present

## 2016-03-30 DIAGNOSIS — N390 Urinary tract infection, site not specified: Principal | ICD-10-CM | POA: Diagnosis present

## 2016-03-30 DIAGNOSIS — N281 Cyst of kidney, acquired: Secondary | ICD-10-CM | POA: Diagnosis not present

## 2016-03-30 DIAGNOSIS — Z800 Family history of malignant neoplasm of digestive organs: Secondary | ICD-10-CM

## 2016-03-30 DIAGNOSIS — Z8371 Family history of colonic polyps: Secondary | ICD-10-CM

## 2016-03-30 DIAGNOSIS — F32A Depression, unspecified: Secondary | ICD-10-CM | POA: Diagnosis present

## 2016-03-30 DIAGNOSIS — Z86010 Personal history of colonic polyps: Secondary | ICD-10-CM

## 2016-03-30 DIAGNOSIS — T8131XA Disruption of external operation (surgical) wound, not elsewhere classified, initial encounter: Secondary | ICD-10-CM | POA: Diagnosis not present

## 2016-03-30 LAB — CBC WITH DIFFERENTIAL/PLATELET
BASOS PCT: 0 %
Basophils Absolute: 0.1 10*3/uL (ref 0.0–0.1)
EOS ABS: 0.1 10*3/uL (ref 0.0–0.7)
EOS PCT: 1 %
HCT: 37.8 % — ABNORMAL LOW (ref 39.0–52.0)
Hemoglobin: 11.3 g/dL — ABNORMAL LOW (ref 13.0–17.0)
LYMPHS ABS: 2.1 10*3/uL (ref 0.7–4.0)
Lymphocytes Relative: 14 %
MCH: 24.8 pg — AB (ref 26.0–34.0)
MCHC: 29.9 g/dL — AB (ref 30.0–36.0)
MCV: 83.1 fL (ref 78.0–100.0)
Monocytes Absolute: 1.6 10*3/uL — ABNORMAL HIGH (ref 0.1–1.0)
Monocytes Relative: 11 %
Neutro Abs: 10.8 10*3/uL — ABNORMAL HIGH (ref 1.7–7.7)
Neutrophils Relative %: 74 %
PLATELETS: 362 10*3/uL (ref 150–400)
RBC: 4.55 MIL/uL (ref 4.22–5.81)
RDW: 17.1 % — ABNORMAL HIGH (ref 11.5–15.5)
WBC: 14.6 10*3/uL — AB (ref 4.0–10.5)

## 2016-03-30 LAB — URINE MICROSCOPIC-ADD ON

## 2016-03-30 LAB — COMPREHENSIVE METABOLIC PANEL
ALBUMIN: 3 g/dL — AB (ref 3.5–5.0)
ALT: 12 U/L — AB (ref 17–63)
ANION GAP: 11 (ref 5–15)
AST: 21 U/L (ref 15–41)
Alkaline Phosphatase: 74 U/L (ref 38–126)
BUN: 15 mg/dL (ref 6–20)
CHLORIDE: 102 mmol/L (ref 101–111)
CO2: 30 mmol/L (ref 22–32)
Calcium: 8.6 mg/dL — ABNORMAL LOW (ref 8.9–10.3)
Creatinine, Ser: 0.7 mg/dL (ref 0.61–1.24)
GFR calc non Af Amer: >60 mL/min (ref >60)
Glucose, Bld: 124 mg/dL — ABNORMAL HIGH (ref 65–99)
Potassium: 3.3 mmol/L — ABNORMAL LOW (ref 3.5–5.1)
SODIUM: 143 mmol/L (ref 135–145)
Total Bilirubin: 0.5 mg/dL (ref 0.3–1.2)
Total Protein: 7.2 g/dL (ref 6.5–8.1)

## 2016-03-30 LAB — URINALYSIS, ROUTINE W REFLEX MICROSCOPIC
Bilirubin Urine: NEGATIVE
Glucose, UA: >1000 mg/dL — AB
Hgb urine dipstick: NEGATIVE
Ketones, ur: NEGATIVE mg/dL
LEUKOCYTES UA: NEGATIVE
NITRITE: NEGATIVE
Protein, ur: 30 mg/dL — AB
SPECIFIC GRAVITY, URINE: 1.015 (ref 1.005–1.030)
pH: 6 (ref 5.0–8.0)

## 2016-03-30 LAB — TROPONIN I: Troponin I: 0.05 ng/mL (ref <0.03)

## 2016-03-30 LAB — GLUCOSE, CAPILLARY: Glucose-Capillary: 174 mg/dL — ABNORMAL HIGH (ref 65–99)

## 2016-03-30 LAB — MAGNESIUM: Magnesium: 1.8 mg/dL (ref 1.7–2.4)

## 2016-03-30 LAB — CBG MONITORING, ED: Glucose-Capillary: 80 mg/dL (ref 65–99)

## 2016-03-30 MED ORDER — PRAVASTATIN SODIUM 40 MG PO TABS
40.0000 mg | ORAL_TABLET | Freq: Every day | ORAL | Status: DC
  Administered 2016-03-30 – 2016-04-02 (×4): 40 mg via ORAL
  Filled 2016-03-30 (×4): qty 1

## 2016-03-30 MED ORDER — INSULIN ASPART 100 UNIT/ML ~~LOC~~ SOLN
0.0000 [IU] | Freq: Three times a day (TID) | SUBCUTANEOUS | Status: DC
  Administered 2016-04-01 – 2016-04-02 (×4): 1 [IU] via SUBCUTANEOUS
  Administered 2016-04-02 – 2016-04-03 (×2): 2 [IU] via SUBCUTANEOUS

## 2016-03-30 MED ORDER — BACLOFEN 10 MG PO TABS
20.0000 mg | ORAL_TABLET | Freq: Three times a day (TID) | ORAL | Status: DC
  Administered 2016-03-30 – 2016-03-31 (×2): 20 mg via ORAL
  Filled 2016-03-30 (×2): qty 2

## 2016-03-30 MED ORDER — ENOXAPARIN SODIUM 80 MG/0.8ML ~~LOC~~ SOLN
70.0000 mg | SUBCUTANEOUS | Status: DC
  Administered 2016-03-30 – 2016-04-02 (×4): 70 mg via SUBCUTANEOUS
  Filled 2016-03-30 (×4): qty 0.8

## 2016-03-30 MED ORDER — IOPAMIDOL (ISOVUE-300) INJECTION 61%
100.0000 mL | Freq: Once | INTRAVENOUS | Status: AC | PRN
  Administered 2016-03-30: 100 mL via INTRAVENOUS

## 2016-03-30 MED ORDER — HYDRALAZINE HCL 20 MG/ML IJ SOLN
10.0000 mg | INTRAMUSCULAR | Status: DC | PRN
  Administered 2016-03-30 – 2016-03-31 (×2): 10 mg via INTRAVENOUS
  Filled 2016-03-30 (×2): qty 1

## 2016-03-30 MED ORDER — INSULIN ASPART 100 UNIT/ML ~~LOC~~ SOLN: 0.0000 [IU] | Freq: Every day | SUBCUTANEOUS | Status: DC

## 2016-03-30 MED ORDER — OXYCODONE HCL 5 MG PO TABS
5.0000 mg | ORAL_TABLET | ORAL | Status: DC | PRN
  Administered 2016-03-30 – 2016-04-03 (×14): 5 mg via ORAL
  Filled 2016-03-30 (×14): qty 1

## 2016-03-30 MED ORDER — OXYCODONE-ACETAMINOPHEN 10-325 MG PO TABS: 1.0000 | ORAL_TABLET | ORAL | Status: DC | PRN

## 2016-03-30 MED ORDER — CARVEDILOL 12.5 MG PO TABS
12.5000 mg | ORAL_TABLET | Freq: Two times a day (BID) | ORAL | Status: DC
  Administered 2016-03-30 – 2016-04-03 (×8): 12.5 mg via ORAL
  Filled 2016-03-30 (×6): qty 1
  Filled 2016-03-30: qty 4
  Filled 2016-03-30: qty 1

## 2016-03-30 MED ORDER — POTASSIUM CHLORIDE IN NACL 20-0.9 MEQ/L-% IV SOLN
INTRAVENOUS | Status: AC
  Administered 2016-03-30: 18:00:00 via INTRAVENOUS

## 2016-03-30 MED ORDER — LISINOPRIL 10 MG PO TABS
40.0000 mg | ORAL_TABLET | Freq: Every day | ORAL | Status: DC
  Administered 2016-03-31 – 2016-04-03 (×4): 40 mg via ORAL
  Filled 2016-03-30 (×4): qty 4

## 2016-03-30 MED ORDER — DULOXETINE HCL 30 MG PO CPEP
30.0000 mg | ORAL_CAPSULE | Freq: Every day | ORAL | Status: DC
Start: 2016-03-31 — End: 2016-04-03
  Administered 2016-03-31 – 2016-04-03 (×4): 30 mg via ORAL
  Filled 2016-03-30 (×4): qty 1

## 2016-03-30 MED ORDER — ONDANSETRON HCL 4 MG PO TABS: 4.0000 mg | ORAL_TABLET | Freq: Four times a day (QID) | ORAL | Status: DC | PRN

## 2016-03-30 MED ORDER — AMLODIPINE BESYLATE 5 MG PO TABS
10.0000 mg | ORAL_TABLET | Freq: Every day | ORAL | Status: DC
  Administered 2016-03-31 – 2016-04-03 (×4): 10 mg via ORAL
  Filled 2016-03-30 (×4): qty 2

## 2016-03-30 MED ORDER — CIPROFLOXACIN IN D5W 400 MG/200ML IV SOLN
400.0000 mg | Freq: Two times a day (BID) | INTRAVENOUS | Status: AC
  Administered 2016-03-31 – 2016-04-02 (×5): 400 mg via INTRAVENOUS
  Filled 2016-03-30 (×5): qty 200

## 2016-03-30 MED ORDER — OXYCODONE-ACETAMINOPHEN 5-325 MG PO TABS
1.0000 | ORAL_TABLET | ORAL | Status: DC | PRN
  Administered 2016-03-30 – 2016-04-03 (×15): 1 via ORAL
  Filled 2016-03-30 (×15): qty 1

## 2016-03-30 MED ORDER — POTASSIUM CHLORIDE CRYS ER 20 MEQ PO TBCR
20.0000 meq | EXTENDED_RELEASE_TABLET | Freq: Two times a day (BID) | ORAL | Status: DC
  Administered 2016-03-30 – 2016-03-31 (×3): 20 meq via ORAL
  Filled 2016-03-30 (×3): qty 1

## 2016-03-30 MED ORDER — ONDANSETRON HCL 4 MG/2ML IJ SOLN: 4.0000 mg | Freq: Four times a day (QID) | INTRAMUSCULAR | Status: DC | PRN

## 2016-03-30 MED ORDER — ACETAMINOPHEN 650 MG RE SUPP: 650.0000 mg | Freq: Four times a day (QID) | RECTAL | Status: DC | PRN

## 2016-03-30 MED ORDER — SODIUM CHLORIDE 0.9 % IV SOLN
1000.0000 mL | INTRAVENOUS | Status: DC
  Administered 2016-03-30: 1000 mL via INTRAVENOUS

## 2016-03-30 MED ORDER — ACETAMINOPHEN 325 MG PO TABS: 650.0000 mg | ORAL_TABLET | Freq: Four times a day (QID) | ORAL | Status: DC | PRN

## 2016-03-30 MED ORDER — INSULIN ASPART 100 UNIT/ML ~~LOC~~ SOLN
6.0000 [IU] | Freq: Three times a day (TID) | SUBCUTANEOUS | Status: DC
  Administered 2016-03-31 – 2016-04-03 (×8): 6 [IU] via SUBCUTANEOUS

## 2016-03-30 MED ORDER — CIPROFLOXACIN IN D5W 400 MG/200ML IV SOLN
400.0000 mg | Freq: Once | INTRAVENOUS | Status: AC
  Administered 2016-03-30: 400 mg via INTRAVENOUS
  Filled 2016-03-30: qty 200

## 2016-03-30 MED ORDER — FENTANYL CITRATE (PF) 100 MCG/2ML IJ SOLN
50.0000 ug | Freq: Once | INTRAMUSCULAR | Status: AC
  Administered 2016-03-30: 50 ug via INTRAVENOUS
  Filled 2016-03-30: qty 2

## 2016-03-30 MED ORDER — SODIUM CHLORIDE 0.9 % IV SOLN
1000.0000 mL | Freq: Once | INTRAVENOUS | Status: AC
  Administered 2016-03-30: 1000 mL via INTRAVENOUS

## 2016-03-30 MED ORDER — POLYETHYLENE GLYCOL 3350 17 G PO PACK
17.0000 g | PACK | Freq: Every day | ORAL | Status: DC | PRN
  Administered 2016-04-02: 17 g via ORAL
  Filled 2016-03-30: qty 1

## 2016-03-30 MED ORDER — INSULIN GLARGINE 100 UNIT/ML ~~LOC~~ SOLN
50.0000 [IU] | Freq: Every day | SUBCUTANEOUS | Status: DC
  Administered 2016-03-30 – 2016-04-02 (×4): 50 [IU] via SUBCUTANEOUS
  Filled 2016-03-30 (×7): qty 0.5

## 2016-03-30 NOTE — ED Triage Notes (Signed)
Pt fell yesterday and has been c/o right hip/back pain. Pt ususally walks with walker. Pt states has walked since fall. A/o. gen weakness noted.

## 2016-03-30 NOTE — ED Provider Notes (Signed)
[facility] DEPT Provider Note   CSN: [id number] Arrival date & time: 03/30/16  1001  By signing my name below, I, Francisco Powell, attest that this documentation has been prepared under the direction and in the presence of [name], MD . Electronically Signed: [name], Scribe. 03/30/2016. 10:37 AM.  History   Chief Complaint Chief Complaint  Patient presents with  . Hip Pain  . Fall    The history is provided by a relative and the patient (daughter). No language interpreter was used.    HPI Comments:  Francisco Powell is a 75 y.o. male who presents to the Emergency Department complaining of generalized weakness since yesterday. Daughter states pt was going from the commode to his chair when went down to his knees. Daughter states pt struck his head. Pt seemed mentally fine last night per daughter but she reports some confusion today. Pt is also complaining of increased lower back and right hip pain. Pt has lumbar back surgery on August 4th, 2017; this was his 5th back surgery per, daughter. He was started on Cymbalta 2 weeks ago by PCP which moderate improvement. Pt and daughter deny fever, CP, SOB, and abdominal pain. Pt is on plavix and takes ASA daily.Patient brought to the emergency department because of increasing generalized weakness and difficulty ambulating today.     [name]- Surgeon  Past Medical History:  Diagnosis Date  . Anemia, iron deficiency   . Anxiety   . Asbestosis(501)   . Coronary atherosclerosis of native coronary artery    DES RCA and staged DES LAD May 2015, LVEF 55%  . Essential hypertension, benign   . Gout   . Hx of adenomatous colonic polyps   . Hyperlipidemia   . Kidney carcinoma ([place]) 1998  . Lumbar back pain    Spondylosis and spondylolisthesis   . Myocardial infarction 2004  . Nephrolithiasis   . NSTEMI (non-ST elevated myocardial infarction) [facility])    May 2015  . Obesity   . Sciatica of right side   . Type II diabetes mellitus  [facility])     Patient Active Problem List   Diagnosis Date Noted  . Right leg weakness 03/18/2016  . Diabetes type 2, uncontrolled ([name]) 08/15/2015  . History of colonic polyps   . Hx of adenomatous colonic polyps 04/04/2015  . Elevated PSA 09/04/2014  . Adenomatous polyps 03/06/2014  . FH: colon cancer 03/06/2014  . Helicobacter pylori (H. pylori) infection 03/06/2014  . Postherpetic neuralgia 02/28/2014  . Depression 01/14/2014  . Coronary atherosclerosis of native coronary artery 11/11/2013  . Bifascicular block - RBBB, LAFB 11/10/2013  . Essential hypertension 11/10/2013  . Hyperlipidemia 10/31/2008    Past Surgical History:  Procedure Laterality Date  . APPENDECTOMY    . BACK SURGERY  X 4  . CHOLECYSTECTOMY     Biliary dyskinesia  . COLONOSCOPY  2009   Dr. Arnoldo Morale: normal  . COLONOSCOPY  June 2011   Dr. Gala Romney: normal rectum, left-sided diverticula  . COLONOSCOPY N/A 04/12/2015   Procedure: COLONOSCOPY;  Surgeon: [name], MD;  Location: [facility];  Service: Endoscopy;  Laterality: N/A;  1115   . CYSTOSCOPY W/ LITHOLAPAXY / EHL  2009  . ESOPHAGOGASTRODUODENOSCOPY  June 2011   Dr. Gala Romney: erosive reflux esophagitis, small hiatal hernia, +H.pylori gastritis  . EYE SURGERY    . HOLMIUM LASER APPLICATION Right [id number]  . LEFT HEART CATHETERIZATION WITH CORONARY ANGIOGRAM N/A 11/09/2013   Procedure: LEFT HEART CATHETERIZATION WITH CORONARY ANGIOGRAM;  Surgeon: [name], MD;  Location: [facility] CATH LAB;  Service: Cardiovascular;  Laterality: N/A;  . LUMBAR DISC SURGERY  X 3  . PERCUTANEOUS CORONARY STENT INTERVENTION (PCI-S) N/A 11/10/2013   Procedure: PERCUTANEOUS CORONARY STENT INTERVENTION (PCI-S);  Surgeon: [name], MD;  Location: [facility] CATH LAB;  Service: Cardiovascular;  Laterality: N/A;  . POSTERIOR LUMBAR FUSION  2014  . RETINAL DETACHMENT SURGERY Right 1998   Secondary to injury detached retina/cataracts  . TONSILLECTOMY    . TUMOR EXCISION  1995        Home Medications    Prior to Admission medications   Medication Sig Start Date End Date Taking? Authorizing Provider  amLODipine (NORVASC) 10 MG tablet Take 1 tablet (10 mg total) by mouth daily. 03/18/16   [name], MD  aspirin 81 MG tablet Take 81 mg by mouth daily.      Historical Provider, MD  bisacodyl (BISACODYL) 5 MG EC tablet Take 1 tablet (5 mg total) by mouth as directed. 04/04/15   Mahala Menghini, PA-C  carvedilol (COREG) 12.5 MG tablet TAKE 1 TABLET BY MOUTH TWICE DAILY WITH MEALS. 03/18/16   [name], MD  DULoxetine (CYMBALTA) 30 MG capsule Take 1 capsule (30 mg total) by mouth daily. 03/18/16   [name], MD  insulin aspart (NOVOLOG FLEXPEN) 100 UNIT/ML FlexPen INJECT 25 UNITS INTO THE SKIN TWICE DAILY. 03/18/16   [name], MD  insulin glargine (LANTUS) 100 UNIT/ML injection To use 50 units nightly may titrate up to 75 units nightly 03/18/16   [name], MD  INVOKANA 300 MG TABS tablet TAKE (1) TABLET BY MOUTH ONCE DAILY. 02/20/16   [name], MD  lisinopril (PRINIVIL,ZESTRIL) 40 MG tablet Take 1 tablet (40 mg total) by mouth daily. 08/15/15   [name], MD  metFORMIN (GLUCOPHAGE) 1000 MG tablet TAKE 1 TABLET BY MOUTH TWICE DAILY WITH A MEAL. 08/15/15   [name], MD  nitroGLYCERIN (NITROSTAT) 0.4 MG SL tablet Place 1 tablet (0.4 mg total) under the tongue every 5 (five) minutes x 3 doses as needed for chest pain. 11/15/14   [name], MD  oxyCODONE-acetaminophen (PERCOCET) 10-325 MG per tablet Take 1 tablet by mouth every 4 (four) hours as needed for pain. Per DR ROY    Historical Provider, MD  pravastatin (PRAVACHOL) 40 MG tablet TAKE 1 TABLET BY MOUTH AT BEDTIME FOR CHOLESTEROL. 08/15/15   [name], MD    Family History Family History  Problem Relation Age of Onset  . Colon cancer Mother     deceased   . Cancer Maternal Grandmother     Gastric cancer  . Breast cancer Sister   . Heart attack Father   . Colon cancer  Maternal Uncle   . Colon cancer Maternal Uncle   . Colon cancer Other     maternal great uncle  . Colon polyps Sister     Social History Social History  Substance Use Topics  . Smoking status: Never Smoker  . Smokeless tobacco: Current User    Types: Chew  . Alcohol use No     Allergies   Penicillins; Other; Atorvastatin; and Celexa [citalopram]   Review of Systems Review of Systems 10 systems reviewed and all are negative for acute change except as noted in the HPI.  Physical Exam Updated Vital Signs There were no vitals taken for this visit.  Physical Exam  Constitutional: He appears well-developed and  well-nourished.  HENT:  Head: Normocephalic and atraumatic.  Eyes: EOM are normal.  Neck: Normal range of motion.  Cardiovascular: Normal rate, regular rhythm and normal heart sounds.   Pulmonary/Chest: Effort normal and breath sounds normal. No respiratory distress.  Abdominal: Soft. He exhibits no distension. There is no tenderness.  Musculoskeletal: Normal range of motion.  Neurological: He is alert.  505 then bilateral upper and lower extremity major muscle groups.  Oriented 2.  Skin: Skin is warm and dry.  Psychiatric: He has a normal mood and affect. Judgment normal.  Nursing note and vitals reviewed.    ED Treatments / Results  DIAGNOSTIC STUDIES:  Oxygen Saturation is 97% on RA, normal by my interpretation.    COORDINATION OF CARE:  10:35 AM Discussed treatment plan with pt and family at bedside and they agreed to plan.  Labs (all labs ordered are listed, but only abnormal results are displayed) Labs Reviewed  CBC WITH DIFFERENTIAL/PLATELET - Abnormal; Notable for the following:       Result Value   WBC 14.6 (*)    Hemoglobin 11.3 (*)    HCT 37.8 (*)    MCH 24.8 (*)    MCHC 29.9 (*)    RDW 17.1 (*)    Neutro Abs 10.8 (*)    Monocytes Absolute 1.6 (*)    All other components within normal limits  COMPREHENSIVE METABOLIC PANEL - Abnormal;  Notable for the following:    Potassium 3.3 (*)    Glucose, Bld 124 (*)    Calcium 8.6 (*)    Albumin 3.0 (*)    ALT 12 (*)    All other components within normal limits  TROPONIN I - Abnormal; Notable for the following:    Troponin I 0.05 (*)    All other components within normal limits  URINALYSIS, ROUTINE W REFLEX MICROSCOPIC (NOT AT [facility]) - Abnormal; Notable for the following:    Glucose, UA >1000 (*)    Protein, ur 30 (*)    All other components within normal limits  URINE MICROSCOPIC-ADD ON - Abnormal; Notable for the following:    Squamous Epithelial / LPF 0-5 (*)    Bacteria, UA FEW (*)    All other components within normal limits  URINE CULTURE  CBG MONITORING, ED    EKG  EKG Interpretation  Date/Time:  Monday March 30 2016 11:16:19 EDT Ventricular Rate:  68 PR Interval:    QRS Duration: 157 QT Interval:  441 QTC Calculation: 469 R Axis:   -57 Text Interpretation:  Sinus rhythm Borderline prolonged PR interval Right bundle branch block No significant change was found Confirmed by Avaya Mcjunkins  MD, Kishana Battey ([id number]) on 03/30/2016 1:01:27 PM       Radiology Dg Chest 2 View  Result Date: 03/30/2016 CLINICAL DATA:  Severe right lower quadrant abdominal pain. Generalized weakness. EXAM: CHEST  2 VIEW COMPARISON:  01/15/2016 FINDINGS: Pulmonary vascularity is slightly prominent but the patient is supine. Overall heart size is normal. No infiltrates or effusions. No acute bone abnormality. IMPRESSION: No acute disease in the chest. Electronically Signed   By: Lorriane Shire M.D.   On: 03/30/2016 12:42   Ct Head Wo Contrast  Result Date: 03/30/2016 CLINICAL DATA:  Pain following fall EXAM: CT HEAD WITHOUT CONTRAST TECHNIQUE: Contiguous axial images were obtained from the base of the skull through the vertex without intravenous contrast. COMPARISON:  None. FINDINGS: Brain: There is moderate diffuse atrophy with ventricles somewhat prominent with respect to the sulci. There is  no  intracranial mass, hemorrhage, extra-axial fluid collection, or midline shift. There is mild small vessel disease in the centra semiovale bilaterally. Elsewhere gray-white compartments appear normal. No acute infarct is evident on this study. Vascular: There is no demonstrable hyperdense vessel. There is calcification in the distal left vertebral artery. The vertebral arteries are quite tortuous. There is also calcification in the carotid siphon regions bilaterally. Skull: The bony calvarium appears intact. Sinuses/Orbits: There is mild mucosal thickening in several ethmoid air cells bilaterally. There is mild mucosal thickening in the posterior left maxillary antrum and in the right posterior sphenoid sinus. Orbits appear overall symmetric bilaterally. A small focus of metal is noted along the anterior most aspect of the left globe. Other: Mastoid air cells are clear. IMPRESSION: Atrophy with questionable superimposed normal pressure hydrocephalus. Mild periventricular small vessel disease. No intracranial mass, hemorrhage, or extra-axial fluid collection. No acute infarct evident. There are foci of arterial vascular calcification. There are foci of paranasal sinus disease. There is a small focus of metal along the anterior most aspect of the left globe. Direct visualization of this area advised. Electronically Signed   By: Lowella Grip III M.D.   On: 03/30/2016 12:22   Ct Abdomen Pelvis W Contrast  Result Date: 03/30/2016 CLINICAL DATA:  Worsening back and posterior pelvic pain with leukocytosis, confusion and recent fall. Previous back surgery. EXAM: CT ABDOMEN AND PELVIS WITH CONTRAST TECHNIQUE: Multidetector CT imaging of the abdomen and pelvis was performed using the standard protocol following bolus administration of intravenous contrast. CONTRAST:  153mL ISOVUE-300 IOPAMIDOL (ISOVUE-300) INJECTION 61% COMPARISON:  Abdominal pelvic CT 10/29/2009. Lumbar spine MRI 07/21/2012 and CT 12/20/2015.  FINDINGS: Lower chest: Mild atelectasis at both lung bases. The heart is enlarged. There is atherosclerosis of the aorta and coronary arteries. No significant pleural or pericardial effusion. Hepatobiliary: No suspicious hepatic findings. There are scattered calcified granulomas. Previous cholecystectomy without significant biliary dilatation. Pancreas: Unremarkable. No pancreatic ductal dilatation or surrounding inflammatory changes. Spleen: Normal in size without focal abnormality. Adrenals/Urinary Tract: Both adrenal glands appear normal. There is an enlarging nonobstructing 10 mm calculus in the lower pole of the left kidney (image 43). No other urinary tract calculi are seen. There is no evidence of hydronephrosis or perinephric soft tissue stranding. There are several left renal cysts, measuring up to 4.7 x 4.0 cm on image 36. No suspicious renal findings. The bladder appears unremarkable. Stomach/Bowel: No evidence of bowel wall thickening, distention or surrounding inflammatory change. The proximal colon is decompressed. The appendix is not seen. Vascular/Lymphatic: There are no enlarged abdominal or pelvic lymph nodes. Aortic and branch vessel atherosclerosis. No evidence of large vessel occlusion. Reproductive: Mild heterogeneous enlargement of the prostate gland. The seminal vesicles appear normal. Other: There is mildly prominent fat in both inguinal canals. There is no herniated bowel. No ascites. Musculoskeletal: No acute osseous findings are demonstrated. There are extensive postsurgical changes within the lumbar spine status post L2 through S1 fusion. Lumbar spine findings are dictated in greater detail on the separate examination of the lumbar spine. IMPRESSION: 1. No acute findings or explanation for the patient's symptoms. 2. Postsurgical and degenerative changes throughout the lumbar spine. Lumbar spine details are dictated separately. 3. Nonobstructing left renal calculus.  Left renal cysts. 4.   Aortic Atherosclerosis (ICD10-170.0) Electronically Signed   By: Richardean Sale M.D.   On: 03/30/2016 16:03   Ct L-spine No Charge  Result Date: 03/30/2016 CLINICAL DATA:  Back pain. EXAM: CT LUMBAR SPINE WITHOUT CONTRAST  TECHNIQUE: Multidetector CT imaging of the lumbar spine was performed without intravenous contrast administration. Multiplanar CT image reconstructions were also generated. COMPARISON:  Radiographs of March 05, 2016. FINDINGS: Segmentation: Partially lumbarized S1 vertebral body is noted. Alignment: Stable grade 1 anterolisthesis of L5-S1 is noted secondary to posterior facet joint hypertrophy. Otherwise good alignment of vertebral bodies is noted. Vertebrae: No acute fracture is noted. Status post surgical posterior fusion extending from L2-S1 with bilateral intrapedicular screw placement. Interbody fusion is also noted at these levels. Status post laminectomy as well extending from L2-S1. Status post left lateral fusion of L3-4. Paraspinal and other soft tissues: No definite evidence of significant bony or soft tissue stenosis seen in the central spinal canal. Disc levels: See above description for extensive postsurgical changes. IMPRESSION: Extensive postsurgical and degenerative changes as described above. No acute abnormality seen in the lumbar spine. Electronically Signed   By: Marijo Conception, M.D.   On: 03/30/2016 15:56    Procedures Procedures (including critical care time)  Medications Ordered in ED Medications  0.9 %  sodium chloride infusion (0 mLs Intravenous Stopped 03/30/16 1300)    Followed by  0.9 %  sodium chloride infusion (1,000 mLs Intravenous New Bag/Given 03/30/16 1303)  ciprofloxacin (CIPRO) IVPB 400 mg (not administered)  fentaNYL (SUBLIMAZE) injection 50 mcg (50 mcg Intravenous Given 03/30/16 1327)  iopamidol (ISOVUE-300) 61 % injection 100 mL (100 mLs Intravenous Contrast Given 03/30/16 1504)     Initial Impression / Assessment and Plan / ED  Course  I have reviewed the triage vital signs and the nursing notes.  Pertinent labs & imaging results that were available during my care of the patient were reviewed by me and considered in my medical decision making (see chart for details).  Clinical Course    No sustaining clear etiology for the patient's generalized weakness except for possible urinary tract infection.  Urine culture sent.  Patient started on IV ciprofloxacin.  At this time I think is too weak to go home.  We will initiate IV antibiotics and see if this improves his generalized weakness.  He'll benefit from physical therapy and occupational therapy in the hospital and may require placement in a facility.  Final Clinical Impressions(s) / ED Diagnoses   Final diagnoses:  Elevated WBC count  Leg weakness  Bilateral leg weakness  Weakness  Acute cystitis without hematuria    New Prescriptions New Prescriptions   No medications on file   I personally performed the services described in this documentation, which was scribed in my presence. The recorded information has been reviewed and is accurate.        [name], MD 03/30/16 [phone]

## 2016-03-30 NOTE — ED Notes (Signed)
Pt had another large bm.

## 2016-03-30 NOTE — ED Notes (Addendum)
Pt daughter at bedside. States pt wife fell onto his knees from walker. Later pt fell backwards from knees and hit back of head. Pt son found pt on his side upon his arrival after pt wife called him. Alert to most. Confused on date. Daughter states pt ROM and mobility has decreased since fall.

## 2016-03-30 NOTE — ED Notes (Signed)
cbg 143 en route.

## 2016-03-30 NOTE — ED Notes (Signed)
Pt had large mooshy bm. Pt and linens cleaned. Pt moaned with turning due to pain

## 2016-03-30 NOTE — H&P (Signed)
History and Physical    Francisco Powell [id number] DOB: [birth year] DOA: 03/30/2016  PCP: Francisco Lange, MD   Patient coming from: Home  Chief Complaint: Weakness, confusion, falls  HPI: Francisco Powell is a 75 y.o. male with medical history significant for hypertension, insulin-dependent diabetes mellitus, coronary artery disease, and chronic low back pain status post multiple spinal surgeries who presents to the emergency department with generalized weakness, confusion, and gait instability with recurrent falls. Patient is accompanied by his daughter, a former ICU nurse, who assists with the history. Patient underwent his fourth lumbar spine surgery in August of this year and struggled with significant deconditioning and generalized weakness following this. He had been working with physical therapy and making a steady improvement until recent decline with emotional lability and depression. He was started on Cymbalta 2 weeks ago after a home visit by his PCP and his daughter reports that he seems to have had a very good response to this. Unfortunately, he has again developed generalized weakness over the past 2 days, worsening progressively, and now associated with confusion. Has had difficulty getting out of bed or out of a chair for the past couple days and his gait has been very unsteady. He has fallen twice over the last 24 hours, once striking his head, but not losing consciousness. There has been no fevers or chills and the patient denies chest pain, palpitations, dyspnea, or cough. He endorses acute on chronic low back pain, but denies abdominal pain, flank pain, or dysuria.  ED Course: Upon arrival to the ED, patient is found to be afebrile, saturating well on room air, and with vital signs stable. EKG demonstrates a sinus rhythm with chronic right bundle branch block. CMP is notable for a hypokalemia to 3.3. CBC features a normocytic anemia with hemoglobin of 11.3, down from his typical  range of 13-14. Also noted on CBC is a leukocytosis to 14,600. Troponin is mildly elevated to 0.05. Urinalysis features marked glucosuria, few bacteria, and 6-30 WBC. Chest x-ray is negative for acute cardiopulmonary disease. CT of the abdomen, pelvis, and lumbar spine are negative for acute findings. Head CT findings are notably suggestive of possible NPH. Patient was given 1 L normal saline, urine was sent for culture, and he was treated with empiric ciprofloxacin for possible UTI. Symptomatic care was provided with fentanyl for his acute on chronic low back pain. Patient remained hemodynamically stable in the ED and in no apparent respiratory distress. He will be observed on the medical-surgical unit for ongoing evaluation and management of acute encephalopathy with gait instability and falls.   Review of Systems:  All other systems reviewed and apart from HPI, are negative.  Past Medical History:  Diagnosis Date  . Anemia, iron deficiency   . Anxiety   . Asbestosis(501)   . Coronary atherosclerosis of native coronary artery    DES RCA and staged DES LAD May 2015, LVEF 55%  . Essential hypertension, benign   . Gout   . Hx of adenomatous colonic polyps   . Hyperlipidemia   . Kidney carcinoma ([place]) 1998  . Lumbar back pain    Spondylosis and spondylolisthesis   . Myocardial infarction 2004  . Nephrolithiasis   . NSTEMI (non-ST elevated myocardial infarction) [facility])    May 2015  . Obesity   . Sciatica of right side   . Type II diabetes mellitus ([place])     Past Surgical History:  Procedure Laterality Date  . APPENDECTOMY    .  BACK SURGERY  X 4  . CHOLECYSTECTOMY     Biliary dyskinesia  . COLONOSCOPY  2009   Dr. Arnoldo Morale: normal  . COLONOSCOPY  June 2011   Dr. Gala Romney: normal rectum, left-sided diverticula  . COLONOSCOPY N/A 04/12/2015   Procedure: COLONOSCOPY;  Surgeon: [name], MD;  Location: [facility];  Service: Endoscopy;  Laterality: N/A;  1115   . CYSTOSCOPY W/  LITHOLAPAXY / EHL  2009  . ESOPHAGOGASTRODUODENOSCOPY  June 2011   Dr. Gala Romney: erosive reflux esophagitis, small hiatal hernia, +H.pylori gastritis  . EYE SURGERY    . HOLMIUM LASER APPLICATION Right [id number]  . LEFT HEART CATHETERIZATION WITH CORONARY ANGIOGRAM N/A 11/09/2013   Procedure: LEFT HEART CATHETERIZATION WITH CORONARY ANGIOGRAM;  Surgeon: [name], MD;  Location: [facility] CATH LAB;  Service: Cardiovascular;  Laterality: N/A;  . LUMBAR DISC SURGERY  X 3  . PERCUTANEOUS CORONARY STENT INTERVENTION (PCI-S) N/A 11/10/2013   Procedure: PERCUTANEOUS CORONARY STENT INTERVENTION (PCI-S);  Surgeon: [name], MD;  Location: [facility] CATH LAB;  Service: Cardiovascular;  Laterality: N/A;  . POSTERIOR LUMBAR FUSION  2014  . RETINAL DETACHMENT SURGERY Right 1998   Secondary to injury detached retina/cataracts  . TONSILLECTOMY    . TUMOR EXCISION  1995     reports that he has never smoked. His smokeless tobacco use includes Chew. He reports that he does not drink alcohol or use drugs.  Allergies  Allergen Reactions  . Penicillins Hives, Itching and Swelling  . Other     Bee stings  . Atorvastatin Other (See Comments)    Muscle pain/cramps   . Celexa [Citalopram] Diarrhea    Family History  Problem Relation Age of Onset  . Colon cancer Mother     deceased   . Cancer Maternal Grandmother     Gastric cancer  . Breast cancer Sister   . Heart attack Father   . Colon cancer Maternal Uncle   . Colon cancer Maternal Uncle   . Colon cancer Other     maternal great uncle  . Colon polyps Sister      Prior to Admission medications   Medication Sig Start Date End Date Taking? Authorizing Provider  amLODipine (NORVASC) 10 MG tablet Take 1 tablet (10 mg total) by mouth daily. 03/18/16  Yes [name], MD  baclofen (LIORESAL) 20 MG tablet Take 20 mg by mouth 3 (three) times daily.   Yes Historical Provider, MD  carvedilol (COREG) 12.5 MG tablet TAKE 1 TABLET BY MOUTH TWICE DAILY WITH  MEALS. 03/18/16  Yes [name], MD  DULoxetine (CYMBALTA) 30 MG capsule Take 1 capsule (30 mg total) by mouth daily. 03/18/16  Yes [name], MD  insulin aspart (NOVOLOG FLEXPEN) 100 UNIT/ML FlexPen INJECT 25 UNITS INTO THE SKIN TWICE DAILY. 03/18/16  Yes [name], MD  insulin glargine (LANTUS) 100 UNIT/ML injection To use 50 units nightly may titrate up to 75 units nightly 03/18/16  Yes Scott A Luking, MD  INVOKANA 300 MG TABS tablet TAKE (1) TABLET BY MOUTH ONCE DAILY. 02/20/16  Yes [name], MD  lisinopril (PRINIVIL,ZESTRIL) 40 MG tablet Take 1 tablet (40 mg total) by mouth daily. 08/15/15  Yes [name], MD  Melatonin 10 MG TABS Take 1 tablet by mouth at bedtime.   Yes Historical Provider, MD  metFORMIN (GLUCOPHAGE) 1000 MG tablet TAKE 1 TABLET BY MOUTH TWICE DAILY WITH A MEAL. 08/15/15  Yes [name], MD  nitroGLYCERIN (NITROSTAT) 0.4 MG SL tablet Place 1 tablet (0.4 mg total) under the tongue every 5 (five) minutes x 3 doses as needed for chest pain. 11/15/14  Yes [name], MD  oxyCODONE-acetaminophen (PERCOCET) 10-325 MG per tablet Take 1 tablet by mouth every 4 (four) hours as needed for pain. Per DR ROY   Yes Historical Provider, MD  pravastatin (PRAVACHOL) 40 MG tablet TAKE 1 TABLET BY MOUTH AT BEDTIME FOR CHOLESTEROL. 08/15/15  Yes [name], MD  VALERIAN ROOT PO Take 1 tablet by mouth at bedtime.   Yes Historical Provider, MD    Physical Exam: Vitals:   03/30/16 1230 03/30/16 1300 03/30/16 1330 03/30/16 1543  BP: 179/88 174/99 169/86 143/89  Pulse:  65 68 64  Resp: 15 25 18 17   Temp:      TempSrc:      SpO2:  94% 92% 100%  Weight:      Height:          Constitutional: NAD, calm, comfortable Eyes: PERTLA, lids and conjunctivae normal ENMT: Mucous membranes are dry. Posterior pharynx clear of any exudate or lesions.   Neck: normal, supple, no masses, no thyromegaly Respiratory: clear to auscultation bilaterally, no wheezing, no crackles.  Normal respiratory effort.    Cardiovascular: S1 & S2 heard, regular rate and rhythm, soft systolic murmur at LSB. No extremity edema. 2+ pedal pulses. No significant JVD. Abdomen: No distension, no tenderness, no masses palpated. Bowel sounds normal.  Musculoskeletal: no clubbing / cyanosis. No joint deformity upper and lower extremities. Normal muscle tone.  Skin: no significant rashes, lesions, ulcers. Warm, dry, well-perfused. Neurologic: CN 2-12 grossly intact. Sensation intact, DTR normal. Strength 3-4/5 in distal RLE, otherwise 5/5 throughout.  Psychiatric: Alert and oriented to person, place, hospital, but not month or year. Tearful.     Labs on Admission: I have personally reviewed following labs and imaging studies  CBC:  Recent Labs Lab 03/30/16 1104  WBC 14.6*  NEUTROABS 10.8*  HGB 11.3*  HCT 37.8*  MCV 83.1  PLT 123XX123   Basic Metabolic Panel:  Recent Labs Lab 03/30/16 1104  NA 143  K 3.3*  CL 102  CO2 30  GLUCOSE 124*  BUN 15  CREATININE 0.70  CALCIUM 8.6*   GFR: Estimated Creatinine Clearance: 119.9 mL/min (by C-G formula based on SCr of 0.7 mg/dL). Liver Function Tests:  Recent Labs Lab 03/30/16 1104  AST 21  ALT 12*  ALKPHOS 74  BILITOT 0.5  PROT 7.2  ALBUMIN 3.0*   No results for input(s): LIPASE, AMYLASE in the last 168 hours. No results for input(s): AMMONIA in the last 168 hours. Coagulation Profile: No results for input(s): INR, PROTIME in the last 168 hours. Cardiac Enzymes:  Recent Labs Lab 03/30/16 1104  TROPONINI 0.05*   BNP (last 3 results) No results for input(s): PROBNP in the last 8760 hours. HbA1C: No results for input(s): HGBA1C in the last 72 hours. CBG:  Recent Labs Lab 03/30/16 1437  GLUCAP 80   Lipid Profile: No results for input(s): CHOL, HDL, LDLCALC, TRIG, CHOLHDL, LDLDIRECT in the last 72 hours. Thyroid Function Tests: No results for input(s): TSH, T4TOTAL, FREET4, T3FREE, THYROIDAB in the last 72  hours. Anemia Panel: No results for input(s): VITAMINB12, FOLATE, FERRITIN, TIBC, IRON, RETICCTPCT in the last 72 hours. Urine analysis:    Component Value Date/Time   COLORURINE YELLOW 03/30/2016 1036   APPEARANCEUR CLEAR 03/30/2016 1036   LABSPEC 1.015 03/30/2016 1036   PHURINE 6.0  03/30/2016 1036   GLUCOSEU >1000 (A) 03/30/2016 1036   HGBUR NEGATIVE 03/30/2016 1036   BILIRUBINUR NEGATIVE 03/30/2016 1036   KETONESUR NEGATIVE 03/30/2016 1036   PROTEINUR 30 (A) 03/30/2016 1036   UROBILINOGEN 0.2 10/28/2008 0024   NITRITE NEGATIVE 03/30/2016 1036   LEUKOCYTESUR NEGATIVE 03/30/2016 1036   Sepsis Labs: @LABRCNTIP (procalcitonin:4,lacticidven:4) )No results found for this or any previous visit (from the past 240 hour(s)).   Radiological Exams on Admission: Dg Chest 2 View  Result Date: 03/30/2016 CLINICAL DATA:  Severe right lower quadrant abdominal pain. Generalized weakness. EXAM: CHEST  2 VIEW COMPARISON:  01/15/2016 FINDINGS: Pulmonary vascularity is slightly prominent but the patient is supine. Overall heart size is normal. No infiltrates or effusions. No acute bone abnormality. IMPRESSION: No acute disease in the chest. Electronically Signed   By: Lorriane Shire M.D.   On: 03/30/2016 12:42   Ct Head Wo Contrast  Result Date: 03/30/2016 CLINICAL DATA:  Pain following fall EXAM: CT HEAD WITHOUT CONTRAST TECHNIQUE: Contiguous axial images were obtained from the base of the skull through the vertex without intravenous contrast. COMPARISON:  None. FINDINGS: Brain: There is moderate diffuse atrophy with ventricles somewhat prominent with respect to the sulci. There is no intracranial mass, hemorrhage, extra-axial fluid collection, or midline shift. There is mild small vessel disease in the centra semiovale bilaterally. Elsewhere gray-white compartments appear normal. No acute infarct is evident on this study. Vascular: There is no demonstrable hyperdense vessel. There is calcification in  the distal left vertebral artery. The vertebral arteries are quite tortuous. There is also calcification in the carotid siphon regions bilaterally. Skull: The bony calvarium appears intact. Sinuses/Orbits: There is mild mucosal thickening in several ethmoid air cells bilaterally. There is mild mucosal thickening in the posterior left maxillary antrum and in the right posterior sphenoid sinus. Orbits appear overall symmetric bilaterally. A small focus of metal is noted along the anterior most aspect of the left globe. Other: Mastoid air cells are clear. IMPRESSION: Atrophy with questionable superimposed normal pressure hydrocephalus. Mild periventricular small vessel disease. No intracranial mass, hemorrhage, or extra-axial fluid collection. No acute infarct evident. There are foci of arterial vascular calcification. There are foci of paranasal sinus disease. There is a small focus of metal along the anterior most aspect of the left globe. Direct visualization of this area advised. Electronically Signed   By: Lowella Grip III M.D.   On: 03/30/2016 12:22   Ct Abdomen Pelvis W Contrast  Result Date: 03/30/2016 CLINICAL DATA:  Worsening back and posterior pelvic pain with leukocytosis, confusion and recent fall. Previous back surgery. EXAM: CT ABDOMEN AND PELVIS WITH CONTRAST TECHNIQUE: Multidetector CT imaging of the abdomen and pelvis was performed using the standard protocol following bolus administration of intravenous contrast. CONTRAST:  184mL ISOVUE-300 IOPAMIDOL (ISOVUE-300) INJECTION 61% COMPARISON:  Abdominal pelvic CT 10/29/2009. Lumbar spine MRI 07/21/2012 and CT 12/20/2015. FINDINGS: Lower chest: Mild atelectasis at both lung bases. The heart is enlarged. There is atherosclerosis of the aorta and coronary arteries. No significant pleural or pericardial effusion. Hepatobiliary: No suspicious hepatic findings. There are scattered calcified granulomas. Previous cholecystectomy without significant  biliary dilatation. Pancreas: Unremarkable. No pancreatic ductal dilatation or surrounding inflammatory changes. Spleen: Normal in size without focal abnormality. Adrenals/Urinary Tract: Both adrenal glands appear normal. There is an enlarging nonobstructing 10 mm calculus in the lower pole of the left kidney (image 43). No other urinary tract calculi are seen. There is no evidence of hydronephrosis or perinephric soft tissue stranding. There are  several left renal cysts, measuring up to 4.7 x 4.0 cm on image 36. No suspicious renal findings. The bladder appears unremarkable. Stomach/Bowel: No evidence of bowel wall thickening, distention or surrounding inflammatory change. The proximal colon is decompressed. The appendix is not seen. Vascular/Lymphatic: There are no enlarged abdominal or pelvic lymph nodes. Aortic and branch vessel atherosclerosis. No evidence of large vessel occlusion. Reproductive: Mild heterogeneous enlargement of the prostate gland. The seminal vesicles appear normal. Other: There is mildly prominent fat in both inguinal canals. There is no herniated bowel. No ascites. Musculoskeletal: No acute osseous findings are demonstrated. There are extensive postsurgical changes within the lumbar spine status post L2 through S1 fusion. Lumbar spine findings are dictated in greater detail on the separate examination of the lumbar spine. IMPRESSION: 1. No acute findings or explanation for the patient's symptoms. 2. Postsurgical and degenerative changes throughout the lumbar spine. Lumbar spine details are dictated separately. 3. Nonobstructing left renal calculus.  Left renal cysts. 4.  Aortic Atherosclerosis (ICD10-170.0) Electronically Signed   By: Richardean Sale M.D.   On: 03/30/2016 16:03   Ct L-spine No Charge  Result Date: 03/30/2016 CLINICAL DATA:  Back pain. EXAM: CT LUMBAR SPINE WITHOUT CONTRAST TECHNIQUE: Multidetector CT imaging of the lumbar spine was performed without intravenous  contrast administration. Multiplanar CT image reconstructions were also generated. COMPARISON:  Radiographs of March 05, 2016. FINDINGS: Segmentation: Partially lumbarized S1 vertebral body is noted. Alignment: Stable grade 1 anterolisthesis of L5-S1 is noted secondary to posterior facet joint hypertrophy. Otherwise good alignment of vertebral bodies is noted. Vertebrae: No acute fracture is noted. Status post surgical posterior fusion extending from L2-S1 with bilateral intrapedicular screw placement. Interbody fusion is also noted at these levels. Status post laminectomy as well extending from L2-S1. Status post left lateral fusion of L3-4. Paraspinal and other soft tissues: No definite evidence of significant bony or soft tissue stenosis seen in the central spinal canal. Disc levels: See above description for extensive postsurgical changes. IMPRESSION: Extensive postsurgical and degenerative changes as described above. No acute abnormality seen in the lumbar spine. Electronically Signed   By: Marijo Conception, M.D.   On: 03/30/2016 15:56    EKG: Independently reviewed. Sinus rhythm, chronic RBBB  Assessment/Plan  1. Acute encephalopathy  - Developed over the 24-48 hours leading up to admission - Head CT negative for acute etiology, but findings suggest possible NPH, but not sure that this would explain the acute confusion - UA with few bacteria and 6-30 WBC, sample sent for culture; UTI seems unlikely, but currently the best explanation for the acute presentation; will continue empiric treatment with Cipro while awaiting culture data; no other apparent foci of infection - Mild hypokalemia will be corrected as below; no other significant electrolyte derangements  - Pt appears mildly dehydrated, will provide gentle IVF hydration  - Will check TSH, B12, folate, RPR   2. Generalized weakness, falls  - Seems to be a component of deconditioning; PT eval requested  - NPH a consideration given the CT  findings and new depression  - Appears dehydrated on admission and IVF hydration is provided  - Fall precautions; follow-up PT recommendations   3. ?UTI  - UA with bacteria and WBC's, but negative nitrite and leukocyte; sample sent for culture  - With unexplained confusion, he was treated empirically with Cipro in ED; will continue while awaiting culture data  4. Hypokalemia   - Serum potassium 3.3 on admission  - 20 mEq K-Dur given and  20 mEq KCl added to IVF  - Check magnesium level and replete prn  - Repeat chem panel in am   5. Insulin-dependent DM  - A1c 6.6% in July 2017  - Managed at home with Invokana, metformin, Lantus 50 qHS, and Novolog 25 units BID; will hold the oral agents while in hospital  - Check CBG with meals and qHS  - Continue Lantus 50 units qHS with Novolog 6 units TID with meals and a moderate-intensity sliding-scale correctional    6. CAD - No anginal complaints on admission  - EKG non-ischemic, troponin mildly elevated to 0.05  - Hx DES to RCA and LAD in 2015, followed by Dr. Domenic Polite with plans for possible stress-testing in next 6 mos  - Continue Pravachol, Coreg    7. Hypertension - Mildly elevated at time of admission  - Continue Coreg and Norvasc - Hydralazine IVP's available prn   8. Chronic low back pain with RLE weakness   - Underwent 4th lumbar spine surgery in August 2017  - L-spine CT obtained in ED and negative for acute pathology  - Continue current regimen of baclofen and prn Percocet    DVT prophylaxis: sq Lovenox Code Status: Full  Family Communication: Daughter updated at bedside at patient's request Disposition Plan: Observe on med-surg Consults called: None Admission status: Observation    Vianne Bulls, MD [facility] Pager [phone]  If 7PM-7AM, please contact night-coverage [email or web address] Password [email or web address]  03/30/2016, 5:08 PM

## 2016-03-30 NOTE — ED Notes (Signed)
patient given juice and crackers

## 2016-03-30 NOTE — ED Notes (Signed)
Pt being transported to MRI.  Glucose checked per wife request.  Pt denies any complaints.

## 2016-03-31 DIAGNOSIS — D519 Vitamin B12 deficiency anemia, unspecified: Secondary | ICD-10-CM | POA: Diagnosis not present

## 2016-03-31 DIAGNOSIS — L89509 Pressure ulcer of unspecified ankle, unspecified stage: Secondary | ICD-10-CM | POA: Diagnosis present

## 2016-03-31 DIAGNOSIS — E119 Type 2 diabetes mellitus without complications: Secondary | ICD-10-CM | POA: Diagnosis present

## 2016-03-31 DIAGNOSIS — E538 Deficiency of other specified B group vitamins: Secondary | ICD-10-CM | POA: Diagnosis present

## 2016-03-31 DIAGNOSIS — I10 Essential (primary) hypertension: Secondary | ICD-10-CM | POA: Diagnosis present

## 2016-03-31 DIAGNOSIS — N390 Urinary tract infection, site not specified: Secondary | ICD-10-CM | POA: Diagnosis present

## 2016-03-31 DIAGNOSIS — F17220 Nicotine dependence, chewing tobacco, uncomplicated: Secondary | ICD-10-CM | POA: Diagnosis present

## 2016-03-31 DIAGNOSIS — G8929 Other chronic pain: Secondary | ICD-10-CM | POA: Diagnosis present

## 2016-03-31 DIAGNOSIS — D649 Anemia, unspecified: Secondary | ICD-10-CM | POA: Diagnosis present

## 2016-03-31 DIAGNOSIS — R41841 Cognitive communication deficit: Secondary | ICD-10-CM | POA: Diagnosis not present

## 2016-03-31 DIAGNOSIS — I252 Old myocardial infarction: Secondary | ICD-10-CM | POA: Diagnosis not present

## 2016-03-31 DIAGNOSIS — Z86010 Personal history of colonic polyps: Secondary | ICD-10-CM | POA: Diagnosis not present

## 2016-03-31 DIAGNOSIS — M62838 Other muscle spasm: Secondary | ICD-10-CM | POA: Diagnosis not present

## 2016-03-31 DIAGNOSIS — E785 Hyperlipidemia, unspecified: Secondary | ICD-10-CM | POA: Diagnosis present

## 2016-03-31 DIAGNOSIS — M6281 Muscle weakness (generalized): Secondary | ICD-10-CM | POA: Diagnosis not present

## 2016-03-31 DIAGNOSIS — Z803 Family history of malignant neoplasm of breast: Secondary | ICD-10-CM | POA: Diagnosis not present

## 2016-03-31 DIAGNOSIS — Z87442 Personal history of urinary calculi: Secondary | ICD-10-CM | POA: Diagnosis not present

## 2016-03-31 DIAGNOSIS — Z85528 Personal history of other malignant neoplasm of kidney: Secondary | ICD-10-CM | POA: Diagnosis not present

## 2016-03-31 DIAGNOSIS — E876 Hypokalemia: Secondary | ICD-10-CM | POA: Diagnosis present

## 2016-03-31 DIAGNOSIS — M5441 Lumbago with sciatica, right side: Secondary | ICD-10-CM | POA: Diagnosis not present

## 2016-03-31 DIAGNOSIS — L89159 Pressure ulcer of sacral region, unspecified stage: Secondary | ICD-10-CM | POA: Diagnosis present

## 2016-03-31 DIAGNOSIS — Z800 Family history of malignant neoplasm of digestive organs: Secondary | ICD-10-CM | POA: Diagnosis not present

## 2016-03-31 DIAGNOSIS — Z8249 Family history of ischemic heart disease and other diseases of the circulatory system: Secondary | ICD-10-CM | POA: Diagnosis not present

## 2016-03-31 DIAGNOSIS — R410 Disorientation, unspecified: Secondary | ICD-10-CM | POA: Diagnosis not present

## 2016-03-31 DIAGNOSIS — Z7901 Long term (current) use of anticoagulants: Secondary | ICD-10-CM | POA: Diagnosis not present

## 2016-03-31 DIAGNOSIS — I2510 Atherosclerotic heart disease of native coronary artery without angina pectoris: Secondary | ICD-10-CM | POA: Diagnosis not present

## 2016-03-31 DIAGNOSIS — Z8371 Family history of colonic polyps: Secondary | ICD-10-CM | POA: Diagnosis not present

## 2016-03-31 DIAGNOSIS — B0222 Postherpetic trigeminal neuralgia: Secondary | ICD-10-CM | POA: Diagnosis not present

## 2016-03-31 DIAGNOSIS — M545 Low back pain: Secondary | ICD-10-CM | POA: Diagnosis not present

## 2016-03-31 DIAGNOSIS — G9340 Encephalopathy, unspecified: Secondary | ICD-10-CM | POA: Diagnosis not present

## 2016-03-31 DIAGNOSIS — R269 Unspecified abnormalities of gait and mobility: Secondary | ICD-10-CM | POA: Diagnosis not present

## 2016-03-31 DIAGNOSIS — Z794 Long term (current) use of insulin: Secondary | ICD-10-CM | POA: Diagnosis not present

## 2016-03-31 DIAGNOSIS — R972 Elevated prostate specific antigen [PSA]: Secondary | ICD-10-CM | POA: Diagnosis not present

## 2016-03-31 DIAGNOSIS — Z9181 History of falling: Secondary | ICD-10-CM | POA: Diagnosis not present

## 2016-03-31 DIAGNOSIS — F0390 Unspecified dementia without behavioral disturbance: Secondary | ICD-10-CM | POA: Diagnosis not present

## 2016-03-31 DIAGNOSIS — F329 Major depressive disorder, single episode, unspecified: Secondary | ICD-10-CM | POA: Diagnosis present

## 2016-03-31 DIAGNOSIS — I452 Bifascicular block: Secondary | ICD-10-CM | POA: Diagnosis not present

## 2016-03-31 DIAGNOSIS — E860 Dehydration: Secondary | ICD-10-CM | POA: Diagnosis present

## 2016-03-31 DIAGNOSIS — R279 Unspecified lack of coordination: Secondary | ICD-10-CM | POA: Diagnosis not present

## 2016-03-31 LAB — GLUCOSE, CAPILLARY
GLUCOSE-CAPILLARY: 84 mg/dL (ref 65–99)
GLUCOSE-CAPILLARY: 131 mg/dL — AB (ref 65–99)
GLUCOSE-CAPILLARY: 120 mg/dL — AB (ref 65–99)
Glucose-Capillary: 114 mg/dL — ABNORMAL HIGH (ref 65–99)

## 2016-03-31 LAB — CBC WITH DIFFERENTIAL/PLATELET
Basophils Absolute: 0 10*3/uL (ref 0.0–0.1)
Basophils Relative: 0 %
Eosinophils Absolute: 0.1 10*3/uL (ref 0.0–0.7)
Eosinophils Relative: 1 %
HEMATOCRIT: 34.9 % — AB (ref 39.0–52.0)
HEMOGLOBIN: 10.4 g/dL — AB (ref 13.0–17.0)
LYMPHS PCT: 19 %
Lymphs Abs: 1.9 10*3/uL (ref 0.7–4.0)
MCH: 24.6 pg — ABNORMAL LOW (ref 26.0–34.0)
MCHC: 29.8 g/dL — AB (ref 30.0–36.0)
MCV: 82.5 fL (ref 78.0–100.0)
MONO ABS: 1.4 10*3/uL — AB (ref 0.1–1.0)
MONOS PCT: 14 %
NEUTROS ABS: 6.8 10*3/uL (ref 1.7–7.7)
NEUTROS PCT: 66 %
Platelets: 348 10*3/uL (ref 150–400)
RBC: 4.23 MIL/uL (ref 4.22–5.81)
RDW: 17 % — AB (ref 11.5–15.5)
WBC: 10.2 10*3/uL (ref 4.0–10.5)

## 2016-03-31 MED ORDER — HYDROMORPHONE HCL 1 MG/ML IJ SOLN
0.5000 mg | INTRAMUSCULAR | Status: DC | PRN
  Administered 2016-03-31 – 2016-04-03 (×10): 0.5 mg via INTRAVENOUS
  Filled 2016-03-31 (×10): qty 1

## 2016-03-31 MED ORDER — LIDOCAINE 5 % EX PTCH
1.0000 | MEDICATED_PATCH | CUTANEOUS | Status: DC
  Administered 2016-03-31 – 2016-04-02 (×3): 1 via TRANSDERMAL
  Filled 2016-03-31 (×3): qty 1

## 2016-03-31 MED ORDER — SODIUM CHLORIDE 0.9 % IV SOLN
INTRAVENOUS | Status: DC
  Administered 2016-03-31 – 2016-04-01 (×2): via INTRAVENOUS

## 2016-03-31 NOTE — Care Management Obs Status (Signed)
[facility] NOTIFICATION   Patient Details  Name: MERIL PELINO MRN: [id number] Date of Birth: 1940/12/31   Medicare Observation Status Notification Given:  Yes    Vivan Agostino, Chauncey Reading, RN 03/31/2016, 1:54 PM

## 2016-03-31 NOTE — Evaluation (Signed)
Physical Therapy Evaluation Patient Details Name: Francisco Powell MRN: [id number] DOB: [birth year] Today's Date: 03/31/2016   History of Present Illness  75 y.o.malewith medical history significant for hypertension, insulin-dependent diabetes mellitus, coronary artery disease, and chronic low back pain status post multiple spinal surgeries who presents to the emergency department with generalized weakness, confusion, and gait instability with recurrent falls. Patient is accompanied by his daughter, a former ICU nurse, who assists with the history. Patient underwent his fourth lumbar spine surgery in August of this year and struggled with significant deconditioning and generalized weakness following this. He had been working with physical therapy and making a steady improvement until recent decline with emotional lability and depression. He was started on Cymbalta 2 weeks ago after a home visit by his PCP and his daughter reports that he seems to have had a very good response to this. Unfortunately, he has again developed generalized weakness over the past 2 days, worsening progressively, and now associated with confusion.  Dx: Acute Encephalopathy.  Head CT negative for acute etiology, but findings suggest possible NPH, but not sure that this would explain the acute confusion  Clinical Impression  Pt received in bed, wife present, and pt is agreeable to PT evaluation.  Pt has been using a RW for ambulation since his lumbar surgery in August.  Pt has required assistance for dressing and bathing.  During PT evaluation pt requires increased time to answer questions, and multiple times, he leans over to his wife and states "what's that called" and demonstrates some word finding issues.  Pt requires Mod/Max A for all functional mobility today.  He was eventually able to stand, however his R LE demonstrates significant weakness with knee extension and would have buckled underneath him if PT was not providing  Max A.  Pt was able to take a few lateral steps along the EOB, and then returned to sitting position.  Wife expressed that they have been having difficulty caring for him at home.  Due to pt's need for significant assistance, AMS, high fall risk and decreased strength, he is recommended for SNF.      Follow Up Recommendations SNF    Equipment Recommendations  None recommended by PT    Recommendations for Other Services       Precautions / Restrictions Precautions Precautions: Fall;Other (comment) (Spinal Precautions) Precaution Comments: Pt has had 2 falls just prior to admission, has gone down onto his knees 4 times in the past 6 months.   Restrictions Weight Bearing Restrictions: No      Mobility  Bed Mobility Overal bed mobility: Needs Assistance Bed Mobility: Rolling;Sidelying to Sit;Sit to Supine Rolling: Min assist Sidelying to sit: Max assist;HOB elevated     Sit to sidelying: Total assist General bed mobility comments: Increased time sitting on the EOB due to increased pain.  Pt demonstrating strong lean to the right with impaired right/left weight shifting.    Transfers Overall transfer level: Needs assistance Equipment used: Rolling walker (2 wheeled) Transfers: Sit to/from Stand Sit to Stand: Max assist;From elevated surface            Ambulation/Gait Ambulation/Gait assistance: Mod assist;Max assist Ambulation Distance (Feet): 2 Feet Assistive device: Rolling walker (2 wheeled) Gait Pattern/deviations: Step-to pattern     General Gait Details: Pt demonstrates poor R terminal knee extension and requires cues to extend knee to be able to accept weight.  Pt also demonstrates fwd flexed posture.  Pt able to take a few lateral steps to  the HOB.    Stairs            Wheelchair Mobility    Modified Rankin (Stroke Patients Only)       Balance Overall balance assessment: Needs assistance Sitting-balance support: Bilateral upper extremity  supported;Feet supported Sitting balance-Leahy Scale: Fair Sitting balance - Comments: Pt required Max A for weight shifting to the left.  Pt is extremely rigid with difficulty relaxing due to increased pain.  Pt feels as if he is leaning backwards when he is not.  Postural control: Right lateral lean Standing balance support: Bilateral upper extremity supported Standing balance-Leahy Scale: Poor                               Pertinent Vitals/Pain Pain Assessment: 0-10 Pain Score: 7  Pain Location: lower back and down R leg > left - describes it as weakness.  Wife reports spasms.  Pain Intervention(s): Limited activity within patient's tolerance;Repositioned    Home Living   Living Arrangements: Spouse/significant other Available Help at Discharge:  (wife works at [employer] and she works) Type of Home: [facility] Access: Stairs to enter   Technical brewer of Steps: 1 step Home Layout: One level Home Equipment: Toilet riser;Walker - 2 wheels;Cane - single point;Bedside commode;Shower seat;Wheelchair - manual;Grab bars - tub/shower      Prior Function Level of Independence: Needs assistance   Gait / Transfers Assistance Needed: Pt ambulates with a RW since back surgery in August.  Pt states he requires assistance when ambulating.    ADL's / Homemaking Assistance Needed: assistance with dressing, and bathing.          Hand Dominance   Dominant Hand: Right    Extremity/Trunk Assessment   Upper Extremity Assessment: Generalized weakness           Lower Extremity Assessment: Generalized weakness         Communication   Communication: Expressive difficulties;Other (comment) (seems to have word finding issues.  )  Cognition Arousal/Alertness: Lethargic Behavior During Therapy: WFL for tasks assessed/performed Overall Cognitive Status: Within Functional Limits for tasks assessed (Extremely slowed with all answers, and poor motor  planning)                      General Comments      Exercises     Assessment/Plan    PT Assessment Patient needs continued PT services  PT Problem List Decreased strength;Decreased mobility;Decreased range of motion;Decreased activity tolerance;Decreased balance;Decreased cognition;Decreased knowledge of use of DME;Decreased safety awareness;Decreased knowledge of precautions;Pain;Obesity          PT Treatment Interventions DME instruction;Gait training;Functional mobility training;Neuromuscular re-education;Balance training;Therapeutic exercise;Patient/family education;Therapeutic activities;Cognitive remediation    PT Goals (Current goals can be found in the Care Plan section)  Acute Rehab PT Goals Patient Stated Goal: Pt wants to get stronger to walk again.  PT Goal Formulation: With patient/family Time For Goal Achievement: 04/14/16 Potential to Achieve Goals: Fair    Frequency Min 5X/week   Barriers to discharge        Co-evaluation               End of Session Equipment Utilized During Treatment: Gait belt Activity Tolerance: Patient limited by pain Patient left: in bed Nurse Communication: Mobility status Horris Latino notified that pt requires Max A due to R knee buckling.)    Functional Assessment Tool Used: [employer] "6-clicks"  Functional Limitation: Mobility: Walking and moving around Mobility: Walking and Moving Around Current Status [phone]): At least 80 percent but less than 100 percent impaired, limited or restricted Mobility: Walking and Moving Around Goal Status [phone]): At least 60 percent but less than 80 percent impaired, limited or restricted    Time: 1619-1700 PT Time Calculation (min) (ACUTE ONLY): 41 min   Charges:   PT Evaluation $PT Eval Moderate Complexity: 1 Procedure PT Treatments $Therapeutic Exercise: 8-22 mins $Therapeutic Activity: 8-22 mins   PT G Codes:   PT G-Codes **NOT FOR INPATIENT  CLASS** Functional Assessment Tool Used: [employer] "6-clicks"  Functional Limitation: Mobility: Walking and moving around Mobility: Walking and Moving Around Current Status [phone]): At least 80 percent but less than 100 percent impaired, limited or restricted Mobility: Walking and Moving Around Goal Status [phone]): At least 60 percent but less than 80 percent impaired, limited or restricted   Beth Shaterria Sager, PT, DPT X: [phone]

## 2016-03-31 NOTE — Care Management Note (Signed)
Case Management Note  Patient Details  Name: Francisco Powell MRN: [id number] Date of Birth: 1940/09/01  Subjective/Objective:   Patient adm from home with acute encephalopathy and UTI. He lives at home with wife, current [facility] with [facility] PT, RN services.                  Action/Plan: Will resume HH services at discharge. Will follow for needs, PT eval pending.   Expected Discharge Date:         04/02/2016         Expected Discharge Plan:  [place]  In-House Referral:  NA  Discharge planning Services  CM Consult  Post Acute Care Choice:  Home Health, Resumption of Svcs/PTA Provider Choice offered to:     DME Arranged:    DME Agency:     HH Arranged:    HH Agency:     Status of Service:  In process, will continue to follow  If discussed at Long Length of Stay Meetings, dates discussed:    Additional Comments:  Francisco Powell, Chauncey Reading, RN 03/31/2016, 2:39 PM

## 2016-03-31 NOTE — Progress Notes (Signed)
PROGRESS NOTE  Francisco Powell  [id number] DOB: 07-11-1940 DOA: 03/30/2016 PCP: Sallee Lange, MD Outpatient Specialists:  Subjective: Awake alert, seen with her son at bedside, son reported he still slow Had a fever of 100 last evening.  Brief Narrative:  Francisco Powell is a 75 y.o. male with medical history significant for hypertension, insulin-dependent diabetes mellitus, coronary artery disease, and chronic low back pain status post multiple spinal surgeries who presents to the emergency department with generalized weakness, confusion, and gait instability with recurrent falls. Patient is accompanied by his daughter, a former ICU nurse, who assists with the history. Patient underwent his fourth lumbar spine surgery in August of this year and struggled with significant deconditioning and generalized weakness following this. He had been working with physical therapy and making a steady improvement until recent decline with emotional lability and depression. He was started on Cymbalta 2 weeks ago after a home visit by his PCP and his daughter reports that he seems to have had a very good response to this. Unfortunately, he has again developed generalized weakness over the past 2 days, worsening progressively, and now associated with confusion.  Assessment & Plan:   Principal Problem:   Acute encephalopathy Active Problems:   Bifascicular block - RBBB, LAFB   Essential hypertension   Coronary atherosclerosis of native coronary artery   Depression   Insulin dependent diabetes mellitus (HCC)   UTI (urinary tract infection)   Fall at home   Generalized weakness   Hypokalemia   Chronic low back pain   Pressure ulcer of ankle   Pressure ulcer, sacrum   Acute encephalopathy  - Developed over the 24-48 hours leading up to admission - Head CT negative for acute etiology, but findings suggest possible NPH, but not sure that this would explain the acute confusion -Could be  multifactorial secondary to UTI, medications and dehydration. -Will check TSH, B12, folate, RPR  -Improving, he is widely awake and alert today but son at bedside reported history of all slough and answering questions.  Generalized weakness, falls  - Seems to be a component of deconditioning; PT eval requested  - NPH a consideration given the CT findings and new depression  - Appears dehydrated on admission and IVF hydration is provided  - Fall precautions; follow-up PT recommendations   UTI  - UA with bacteria and WBC's, but negative nitrite and leukocyte; sample sent for culture  - Started on Cipro, we'll adjust antibiotics according to culture results.  Hypokalemia   - Serum potassium 3.3 on admission  - Weighted with oral supplements.  Insulin-dependent DM  - A1c 6.6% in July 2017  - Managed at home with Invokana, metformin, Lantus 50 qHS, and Novolog 25 units BID; will hold the oral agents while in hospital  - Check CBG with meals and qHS  - Continue Lantus 50 units qHS with Novolog 6 units TID with meals and a moderate-intensity sliding-scale correctional    CAD - No anginal complaints on admission  - EKG non-ischemic, troponin mildly elevated to 0.05  - Hx DES to RCA and LAD in 2015, followed by Dr. Domenic Polite with plans for possible stress-testing in next 6 mos  - Continue Pravachol, Coreg    Hypertension - Mildly elevated at time of admission  - Continue Coreg and Norvasc - Hydralazine IVP's available prn   Chronic low back pain with RLE weakness   - Underwent 4th lumbar spine surgery in August 2017  - L-spine CT obtained in ED and  negative for acute pathology  - Continue Percocet, and Dilaudid for breakthrough severe pain, discontinue baclofen.   DVT prophylaxis: Lovenox Code Status: Full Code Family Communication:  Disposition Plan:  Diet: Diet heart healthy/carb modified Room service appropriate? Yes; Fluid consistency: Thin  Consultants:    None  Procedures:   None  Antimicrobials:   Rocephin  Objective: Vitals:   03/30/16 2028 03/31/16 0000 03/31/16 0208 03/31/16 0354  BP: (!) 170/76 (!) 161/68 (!) 168/66 (!) 156/67  Pulse: 69 62  63  Resp: 18   16  Temp: 98.1 F (36.7 C)   98.6 F (37 C)  TempSrc: Oral   Oral  SpO2: 95%   95%  Weight:    114.8 kg (253 lb 1.4 oz)  Height:        Intake/Output Summary (Last 24 hours) at 03/31/16 1104 Last data filed at 03/31/16 0417  Gross per 24 hour  Intake          1943.75 ml  Output             1100 ml  Net           843.75 ml   Filed Weights   03/30/16 1012 03/30/16 1802 03/31/16 0354  Weight: 130.6 kg (288 lb) 130.6 kg (288 lb) 114.8 kg (253 lb 1.4 oz)    Examination: General exam: Appears calm and comfortable  Respiratory system: Clear to auscultation. Respiratory effort normal. Cardiovascular system: S1 & S2 heard, RRR. No JVD, murmurs, rubs, gallops or clicks. No pedal edema. Gastrointestinal system: Abdomen is nondistended, soft and nontender. No organomegaly or masses felt. Normal bowel sounds heard. Central nervous system: Alert and oriented. No focal neurological deficits. Extremities: Symmetric 5 x 5 power. Skin: No rashes, lesions or ulcers Psychiatry: Judgement and insight appear normal. Mood & affect appropriate.   Data Reviewed: I have personally reviewed following labs and imaging studies  CBC:  Recent Labs Lab 03/30/16 1104 03/31/16 0614  WBC 14.6* 10.2  NEUTROABS 10.8* 6.8  HGB 11.3* 10.4*  HCT 37.8* 34.9*  MCV 83.1 82.5  PLT 362 0000000   Basic Metabolic Panel:  Recent Labs Lab 03/30/16 1104  NA 143  K 3.3*  CL 102  CO2 30  GLUCOSE 124*  BUN 15  CREATININE 0.70  CALCIUM 8.6*  MG 1.8   GFR: Estimated Creatinine Clearance: 112.4 mL/min (by C-G formula based on SCr of 0.7 mg/dL). Liver Function Tests:  Recent Labs Lab 03/30/16 1104  AST 21  ALT 12*  ALKPHOS 74  BILITOT 0.5  PROT 7.2  ALBUMIN 3.0*   No results  for input(s): LIPASE, AMYLASE in the last 168 hours. No results for input(s): AMMONIA in the last 168 hours. Coagulation Profile: No results for input(s): INR, PROTIME in the last 168 hours. Cardiac Enzymes:  Recent Labs Lab 03/30/16 1104  TROPONINI 0.05*   BNP (last 3 results) No results for input(s): PROBNP in the last 8760 hours. HbA1C: No results for input(s): HGBA1C in the last 72 hours. CBG:  Recent Labs Lab 03/30/16 1437 03/30/16 2023 03/31/16 0744  GLUCAP 80 174* 114*   Lipid Profile: No results for input(s): CHOL, HDL, LDLCALC, TRIG, CHOLHDL, LDLDIRECT in the last 72 hours. Thyroid Function Tests: No results for input(s): TSH, T4TOTAL, FREET4, T3FREE, THYROIDAB in the last 72 hours. Anemia Panel: No results for input(s): VITAMINB12, FOLATE, FERRITIN, TIBC, IRON, RETICCTPCT in the last 72 hours. Urine analysis:    Component Value Date/Time   COLORURINE YELLOW 03/30/2016 1036  APPEARANCEUR CLEAR 03/30/2016 1036   LABSPEC 1.015 03/30/2016 1036   PHURINE 6.0 03/30/2016 1036   GLUCOSEU >1000 (A) 03/30/2016 1036   HGBUR NEGATIVE 03/30/2016 1036   BILIRUBINUR NEGATIVE 03/30/2016 1036   KETONESUR NEGATIVE 03/30/2016 1036   PROTEINUR 30 (A) 03/30/2016 1036   UROBILINOGEN 0.2 10/28/2008 0024   NITRITE NEGATIVE 03/30/2016 1036   LEUKOCYTESUR NEGATIVE 03/30/2016 1036   Sepsis Labs: @LABRCNTIP (procalcitonin:4,lacticidven:4)  )No results found for this or any previous visit (from the past 240 hour(s)).   Invalid input(s): PROCALCITONIN, LACTICACIDVEN   Radiology Studies: Dg Chest 2 View  Result Date: 03/30/2016 CLINICAL DATA:  Severe right lower quadrant abdominal pain. Generalized weakness. EXAM: CHEST  2 VIEW COMPARISON:  01/15/2016 FINDINGS: Pulmonary vascularity is slightly prominent but the patient is supine. Overall heart size is normal. No infiltrates or effusions. No acute bone abnormality. IMPRESSION: No acute disease in the chest. Electronically Signed    By: Lorriane Shire M.D.   On: 03/30/2016 12:42   Ct Head Wo Contrast  Result Date: 03/30/2016 CLINICAL DATA:  Pain following fall EXAM: CT HEAD WITHOUT CONTRAST TECHNIQUE: Contiguous axial images were obtained from the base of the skull through the vertex without intravenous contrast. COMPARISON:  None. FINDINGS: Brain: There is moderate diffuse atrophy with ventricles somewhat prominent with respect to the sulci. There is no intracranial mass, hemorrhage, extra-axial fluid collection, or midline shift. There is mild small vessel disease in the centra semiovale bilaterally. Elsewhere gray-white compartments appear normal. No acute infarct is evident on this study. Vascular: There is no demonstrable hyperdense vessel. There is calcification in the distal left vertebral artery. The vertebral arteries are quite tortuous. There is also calcification in the carotid siphon regions bilaterally. Skull: The bony calvarium appears intact. Sinuses/Orbits: There is mild mucosal thickening in several ethmoid air cells bilaterally. There is mild mucosal thickening in the posterior left maxillary antrum and in the right posterior sphenoid sinus. Orbits appear overall symmetric bilaterally. A small focus of metal is noted along the anterior most aspect of the left globe. Other: Mastoid air cells are clear. IMPRESSION: Atrophy with questionable superimposed normal pressure hydrocephalus. Mild periventricular small vessel disease. No intracranial mass, hemorrhage, or extra-axial fluid collection. No acute infarct evident. There are foci of arterial vascular calcification. There are foci of paranasal sinus disease. There is a small focus of metal along the anterior most aspect of the left globe. Direct visualization of this area advised. Electronically Signed   By: Lowella Grip III M.D.   On: 03/30/2016 12:22   Ct Abdomen Pelvis W Contrast  Result Date: 03/30/2016 CLINICAL DATA:  Worsening back and posterior pelvic  pain with leukocytosis, confusion and recent fall. Previous back surgery. EXAM: CT ABDOMEN AND PELVIS WITH CONTRAST TECHNIQUE: Multidetector CT imaging of the abdomen and pelvis was performed using the standard protocol following bolus administration of intravenous contrast. CONTRAST:  133mL ISOVUE-300 IOPAMIDOL (ISOVUE-300) INJECTION 61% COMPARISON:  Abdominal pelvic CT 10/29/2009. Lumbar spine MRI 07/21/2012 and CT 12/20/2015. FINDINGS: Lower chest: Mild atelectasis at both lung bases. The heart is enlarged. There is atherosclerosis of the aorta and coronary arteries. No significant pleural or pericardial effusion. Hepatobiliary: No suspicious hepatic findings. There are scattered calcified granulomas. Previous cholecystectomy without significant biliary dilatation. Pancreas: Unremarkable. No pancreatic ductal dilatation or surrounding inflammatory changes. Spleen: Normal in size without focal abnormality. Adrenals/Urinary Tract: Both adrenal glands appear normal. There is an enlarging nonobstructing 10 mm calculus in the lower pole of the left kidney (image 43). No  other urinary tract calculi are seen. There is no evidence of hydronephrosis or perinephric soft tissue stranding. There are several left renal cysts, measuring up to 4.7 x 4.0 cm on image 36. No suspicious renal findings. The bladder appears unremarkable. Stomach/Bowel: No evidence of bowel wall thickening, distention or surrounding inflammatory change. The proximal colon is decompressed. The appendix is not seen. Vascular/Lymphatic: There are no enlarged abdominal or pelvic lymph nodes. Aortic and branch vessel atherosclerosis. No evidence of large vessel occlusion. Reproductive: Mild heterogeneous enlargement of the prostate gland. The seminal vesicles appear normal. Other: There is mildly prominent fat in both inguinal canals. There is no herniated bowel. No ascites. Musculoskeletal: No acute osseous findings are demonstrated. There are extensive  postsurgical changes within the lumbar spine status post L2 through S1 fusion. Lumbar spine findings are dictated in greater detail on the separate examination of the lumbar spine. IMPRESSION: 1. No acute findings or explanation for the patient's symptoms. 2. Postsurgical and degenerative changes throughout the lumbar spine. Lumbar spine details are dictated separately. 3. Nonobstructing left renal calculus.  Left renal cysts. 4.  Aortic Atherosclerosis (ICD10-170.0) Electronically Signed   By: Richardean Sale M.D.   On: 03/30/2016 16:03   Ct L-spine No Charge  Result Date: 03/30/2016 CLINICAL DATA:  Back pain. EXAM: CT LUMBAR SPINE WITHOUT CONTRAST TECHNIQUE: Multidetector CT imaging of the lumbar spine was performed without intravenous contrast administration. Multiplanar CT image reconstructions were also generated. COMPARISON:  Radiographs of March 05, 2016. FINDINGS: Segmentation: Partially lumbarized S1 vertebral body is noted. Alignment: Stable grade 1 anterolisthesis of L5-S1 is noted secondary to posterior facet joint hypertrophy. Otherwise good alignment of vertebral bodies is noted. Vertebrae: No acute fracture is noted. Status post surgical posterior fusion extending from L2-S1 with bilateral intrapedicular screw placement. Interbody fusion is also noted at these levels. Status post laminectomy as well extending from L2-S1. Status post left lateral fusion of L3-4. Paraspinal and other soft tissues: No definite evidence of significant bony or soft tissue stenosis seen in the central spinal canal. Disc levels: See above description for extensive postsurgical changes. IMPRESSION: Extensive postsurgical and degenerative changes as described above. No acute abnormality seen in the lumbar spine. Electronically Signed   By: Marijo Conception, M.D.   On: 03/30/2016 15:56        Scheduled Meds: . amLODipine  10 mg Oral Daily  . baclofen  20 mg Oral TID  . carvedilol  12.5 mg Oral BID WC  .  ciprofloxacin  400 mg Intravenous Q12H  . DULoxetine  30 mg Oral Daily  . enoxaparin (LOVENOX) injection  70 mg Subcutaneous Q24H  . insulin aspart  0-5 Units Subcutaneous QHS  . insulin aspart  0-9 Units Subcutaneous TID WC  . insulin aspart  6 Units Subcutaneous TID WC  . insulin glargine  50 Units Subcutaneous QHS  . lisinopril  40 mg Oral Daily  . potassium chloride  20 mEq Oral BID  . pravastatin  40 mg Oral q1800   Continuous Infusions:    LOS: 0 days    Time spent: 35 minutes    Amberlyn Martinezgarcia A, MD [facility] Pager [phone]  If 7PM-7AM, please contact night-coverage [email or web address] Password [email or web address] 03/31/2016, 11:04 AM

## 2016-04-01 ENCOUNTER — Inpatient Hospital Stay (HOSPITAL_COMMUNITY): Payer: Medicare Other

## 2016-04-01 ENCOUNTER — Ambulatory Visit (HOSPITAL_COMMUNITY)
Admission: RE | Admit: 2016-04-01 | Discharge: 2016-04-01 | Disposition: A | Discharge status: Home / Self Care | Payer: Medicare Other | Source: Ambulatory Visit | Attending: Internal Medicine | Admitting: Internal Medicine

## 2016-04-01 DIAGNOSIS — I739 Peripheral vascular disease, unspecified: Secondary | ICD-10-CM | POA: Insufficient documentation

## 2016-04-01 DIAGNOSIS — G912 (Idiopathic) normal pressure hydrocephalus: Secondary | ICD-10-CM | POA: Insufficient documentation

## 2016-04-01 DIAGNOSIS — R269 Unspecified abnormalities of gait and mobility: Secondary | ICD-10-CM | POA: Diagnosis not present

## 2016-04-01 DIAGNOSIS — G9340 Encephalopathy, unspecified: Secondary | ICD-10-CM

## 2016-04-01 DIAGNOSIS — R410 Disorientation, unspecified: Secondary | ICD-10-CM | POA: Diagnosis not present

## 2016-04-01 LAB — GLUCOSE, CAPILLARY
GLUCOSE-CAPILLARY: 136 mg/dL — AB (ref 65–99)
GLUCOSE-CAPILLARY: 131 mg/dL — AB (ref 65–99)
GLUCOSE-CAPILLARY: 148 mg/dL — AB (ref 65–99)
Glucose-Capillary: 146 mg/dL — ABNORMAL HIGH (ref 65–99)

## 2016-04-01 LAB — URINE CULTURE: CULTURE: NO GROWTH

## 2016-04-01 LAB — BASIC METABOLIC PANEL
ANION GAP: 7 (ref 5–15)
BUN: 9 mg/dL (ref 6–20)
CALCIUM: 8.3 mg/dL — AB (ref 8.9–10.3)
CO2: 27 mmol/L (ref 22–32)
CREATININE: 0.7 mg/dL (ref 0.61–1.24)
Chloride: 105 mmol/L (ref 101–111)
GLUCOSE: 137 mg/dL — AB (ref 65–99)
Potassium: 3.4 mmol/L — ABNORMAL LOW (ref 3.5–5.1)
Sodium: 139 mmol/L (ref 135–145)

## 2016-04-01 LAB — CBC
HCT: 35.2 % — ABNORMAL LOW (ref 39.0–52.0)
HEMOGLOBIN: 10.3 g/dL — AB (ref 13.0–17.0)
MCH: 24.1 pg — AB (ref 26.0–34.0)
MCHC: 29.3 g/dL — AB (ref 30.0–36.0)
MCV: 82.4 fL (ref 78.0–100.0)
PLATELETS: 333 10*3/uL (ref 150–400)
RBC: 4.27 MIL/uL (ref 4.22–5.81)
RDW: 16.9 % — ABNORMAL HIGH (ref 11.5–15.5)
WBC: 9.2 10*3/uL (ref 4.0–10.5)

## 2016-04-01 MED ORDER — POTASSIUM CHLORIDE CRYS ER 20 MEQ PO TBCR
20.0000 meq | EXTENDED_RELEASE_TABLET | Freq: Once | ORAL | Status: AC
  Administered 2016-04-01: 20 meq via ORAL
  Filled 2016-04-01: qty 1

## 2016-04-01 MED ORDER — SODIUM CHLORIDE 0.9 % IV SOLN: INTRAVENOUS | Status: AC

## 2016-04-01 MED ORDER — LORAZEPAM 2 MG/ML IJ SOLN
1.0000 mg | Freq: Once | INTRAMUSCULAR | Status: AC | PRN
  Administered 2016-04-01: 1 mg via INTRAVENOUS
  Filled 2016-04-01: qty 1

## 2016-04-01 MED ORDER — POTASSIUM CHLORIDE 20 MEQ/15ML (10%) PO SOLN
40.0000 meq | Freq: Once | ORAL | Status: AC
  Administered 2016-04-01: 40 meq via ORAL
  Filled 2016-04-01: qty 30

## 2016-04-01 NOTE — NC FL2 (Signed)
[place] LEVEL OF CARE SCREENING TOOL     IDENTIFICATION  Patient Name: Francisco Powell Birthdate: 12/02/1940 Sex: male Admission Date (Current Location): 03/30/2016  [facility] and [place] Number:  [employer] and Address:  [place] [address] [place]      Provider Number: [id number]  Attending Physician Name and Address:  [name], MD  Relative Name and Phone Number:       Current Level of Care: Hospital Recommended Level of Care: [place] Prior Approval Number:    Date Approved/Denied:   PASRR Number: [id number] ([id number])  Discharge Plan: Home    Current Diagnoses: Patient Active Problem List   Diagnosis Date Noted  . UTI (urinary tract infection) 03/30/2016  . Fall at home 03/30/2016  . Generalized weakness 03/30/2016  . Acute encephalopathy 03/30/2016  . Hypokalemia 03/30/2016  . Chronic low back pain 03/30/2016  . Pressure ulcer of ankle 03/30/2016  . Pressure ulcer, sacrum 03/30/2016  . Right leg weakness 03/18/2016  . Insulin dependent diabetes mellitus ([place]) 08/15/2015  . History of colonic polyps   . Hx of adenomatous colonic polyps 04/04/2015  . Elevated PSA 09/04/2014  . Adenomatous polyps 03/06/2014  . FH: colon cancer 03/06/2014  . Helicobacter pylori (H. pylori) infection 03/06/2014  . Postherpetic neuralgia 02/28/2014  . Depression 01/14/2014  . Coronary atherosclerosis of native coronary artery 11/11/2013  . Bifascicular block - RBBB, LAFB 11/10/2013  . Essential hypertension 11/10/2013  . Hyperlipidemia 10/31/2008    Orientation RESPIRATION BLADDER Height & Weight     Self, Time, Situation, Place  Normal Incontinent Weight: 256 lb 2.8 oz (116.2 kg) Height:  6\' 2"  (188 cm)  BEHAVIORAL SYMPTOMS/MOOD NEUROLOGICAL BOWEL NUTRITION STATUS      Continent Diet (Heart Healthy/Carb Modified)  AMBULATORY STATUS COMMUNICATION OF NEEDS Skin   Extensive Assist  Verbally Normal                       Personal Care Assistance Level of Assistance  Bathing, Feeding, Dressing Bathing Assistance: Maximum assistance Feeding assistance: Independent Dressing Assistance: Maximum assistance     Functional Limitations Info  Sight, Hearing, Speech Sight Info: Adequate Hearing Info: Adequate Speech Info: Adequate (slow to respond)    [place]  PT (By licensed PT)     PT Frequency: 5x/week              Contractures      Additional Factors Info  Code Status, Allergies, Psychotropic Code Status Info: Full Code Allergies Info: Penicillins, Other, Atorvastatin, Celexa Psychotropic Info: Cymbalta         Current Medications (04/01/2016):  This is the current hospital active medication list Current Facility-Administered Medications  Medication Dose Route Frequency Provider Last Rate Last Dose  . 0.9 %  sodium chloride infusion   Intravenous Continuous [name], MD      . acetaminophen (TYLENOL) tablet 650 mg  650 mg Oral Q6H PRN Vianne Bulls, MD       Or  . acetaminophen (TYLENOL) suppository 650 mg  650 mg Rectal Q6H PRN Vianne Bulls, MD      . amLODipine (NORVASC) tablet 10 mg  10 mg Oral Daily Vianne Bulls, MD   10 mg at 04/01/16 0916  . carvedilol (COREG) tablet 12.5 mg  12.5 mg Oral BID WC Vianne Bulls, MD   12.5 mg at 04/01/16 0916  . ciprofloxacin (  CIPRO) IVPB 400 mg  400 mg Intravenous Q12H [name], MD   400 mg at 04/01/16 0417  . DULoxetine (CYMBALTA) DR capsule 30 mg  30 mg Oral Daily Vianne Bulls, MD   30 mg at 04/01/16 0916  . enoxaparin (LOVENOX) injection 70 mg  70 mg Subcutaneous Q24H Vianne Bulls, MD   70 mg at 03/31/16 2244  . hydrALAZINE (APRESOLINE) injection 10 mg  10 mg Intravenous Q4H PRN Vianne Bulls, MD   10 mg at 03/31/16 0208  . HYDROmorphone (DILAUDID) injection 0.5 mg  0.5 mg Intravenous Q4H PRN Verlee Monte, MD   0.5 mg at 04/01/16 0526  . insulin aspart  (novoLOG) injection 0-5 Units  0-5 Units Subcutaneous QHS Timothy S Opyd, MD      . insulin aspart (novoLOG) injection 0-9 Units  0-9 Units Subcutaneous TID WC Vianne Bulls, MD   1 Units at 04/01/16 0917  . insulin aspart (novoLOG) injection 6 Units  6 Units Subcutaneous TID WC Vianne Bulls, MD   6 Units at 04/01/16 0917  . insulin glargine (LANTUS) injection 50 Units  50 Units Subcutaneous QHS Vianne Bulls, MD   50 Units at 03/31/16 2200  . lidocaine (LIDODERM) 5 % 1 patch  1 patch Transdermal Q24H Verlee Monte, MD   1 patch at 03/31/16 1131  . lisinopril (PRINIVIL,ZESTRIL) tablet 40 mg  40 mg Oral Daily Vianne Bulls, MD   40 mg at 04/01/16 0916  . LORazepam (ATIVAN) injection 1 mg  1 mg Intravenous Once PRN [name], MD      . ondansetron (ZOFRAN) tablet 4 mg  4 mg Oral Q6H PRN Vianne Bulls, MD       Or  . ondansetron (ZOFRAN) injection 4 mg  4 mg Intravenous Q6H PRN Vianne Bulls, MD      . oxyCODONE-acetaminophen (PERCOCET/ROXICET) 5-325 MG per tablet 1 tablet  1 tablet Oral Q4H PRN Vianne Bulls, MD   1 tablet at 04/01/16 0916   And  . oxyCODONE (Oxy IR/ROXICODONE) immediate release tablet 5 mg  5 mg Oral Q4H PRN Vianne Bulls, MD   5 mg at 04/01/16 0916  . polyethylene glycol (MIRALAX / GLYCOLAX) packet 17 g  17 g Oral Daily PRN Ilene Qua Opyd, MD      . potassium chloride SA (K-DUR,KLOR-CON) CR tablet 20 mEq  20 mEq Oral Once [name], MD      . pravastatin (PRAVACHOL) tablet 40 mg  40 mg Oral q1800 Vianne Bulls, MD   40 mg at 03/31/16 1716     Discharge Medications: Please see discharge summary for a list of discharge medications.  Relevant Imaging Results:  Relevant Lab Results:   Additional Information SSN [id number]   Dorethia Jeanmarie, Clydene Pugh, LCSW

## 2016-04-01 NOTE — Consult Note (Signed)
Francisco A. [name], MD     [email or web address]          Francisco Powell is an 75 y.o. male.   ASSESSMENT/PLAN: 1. Acute gait impairment and encephalopathy likely due to acute urinary tract infection and dehydration. I expect that the patient should return to baseline given enough time. 2. Chronic gait impairment primarily due to multiple lumbar spine surgeries. Other contributors includes osteoarthritis and likely diabetic neuropathy. 3. MRI finding of the ventriculomegaly due to volume loss/global cerebral atrophy and not NPH.    The patient is 75 year old white male who has a long-standing history of low back pain. The patient is status post 5 lumbar procedures. The patient apparently fell May of this year and it appears per little daughter that the patient had compression fractures from this causing severe pain. The pain became progressive and excruciating to the point that he had to revert back to using a walker to ambulate. He says quite underwent another lumbar procedure August 2017. The daughter reports that after surgery his pain improves significantly but he had significant right leg weakness which developed postoperatively and the has not improved. Additionally, the patient's affect was quite blunted and he essentially stayed in bed all day long which is unusual for the patient. Seen by his primary care provider Dr. Wolfgang Phoenix Powell to start him on Cymbalta which made a dramatic improvement in his affect, ability to move around and actually his right leg weakness. Unfortunately, about 2 weeks later the patient became relatively abruptly confused disoriented and the develop worse gait. He has been admitted to the hospital further evaluation. He has been noted to have UTI and dehydration. Other workup has been relatively unrevealing although imaging of the brain shows evidence of ventriculomegaly with a question of possible normal pressure hydrocephalus and hence the  consultation. Daughter who is in previous nurse here in the ICU does not report clear history of urinary incontinence. She reports that at baseline he has been well cognitively without significant impairments are concerned. She reports that he actually has improved since being hospitalized. His cognition has improved significantly although he did go down for an MRI today was given Ativan and also pain medication. He is quite drowsy while being examined today. I will to do review systems because of significantly reduced level of consciousness.   GENERAL: Quite drowsy status post Ativan and pain medication.  HEENT: Normal  ABDOMEN: soft  EXTREMITIES: No edema. Significant arthritic changes of the knees, feet and hands.   BACK: Unremarkable  SKIN: Normal by inspection.    MENTAL STATUS: The patient is dozing while into the exam room. He requires constant stimulation to remain awake. No verbal output is appreciated. He does follow commands consistently and bilaterally with prompting. He appears to be mild psychomotor slowing.  CRANIAL NERVES: Pupils are equal, round and reactive to light and accomodation; extra ocular movements are full, there is no significant nystagmus; visual fields are full; upper and lower facial muscles are normal in strength and symmetric, there is no flattening of the nasolabial folds; tongue is midline; uvula is midline; shoulder elevation is normal.  MOTOR: He has at least antigravity strength 3/5 in upper extremities. Legs are 2/5 with hip flexion and dorsiflexion 5.  COORDINATION: No rigidity, tremors, dysmetria or parkinsonism.  REFLEXES: Deep tendon reflexes are symmetrical and normal. Babinski reflexes are flexor bilaterally.   SENSATION: Response to painful some light bilaterally.      Blood pressure (!) 158/61, pulse 62,  temperature 98.7 F (37.1 C), resp. rate 20, height 6' 2" (1.88 m), weight 256 lb 2.8 oz (116.2 kg), SpO2 97 %.  Past Medical History:   Diagnosis Date  . Anemia, iron deficiency   . Anxiety   . Asbestosis(501)   . Coronary atherosclerosis of native coronary artery    DES RCA and staged DES LAD May 2015, LVEF 55%  . Essential hypertension, benign   . Gout   . Hx of adenomatous colonic polyps   . Hyperlipidemia   . Kidney carcinoma ([place]) 1998  . Lumbar back pain    Spondylosis and spondylolisthesis   . Myocardial infarction 2004  . Nephrolithiasis   . NSTEMI (non-ST elevated myocardial infarction) [facility])    May 2015  . Obesity   . Sciatica of right side   . Type II diabetes mellitus ([place])     Past Surgical History:  Procedure Laterality Date  . APPENDECTOMY    . BACK SURGERY  X 4  . CHOLECYSTECTOMY     Biliary dyskinesia  . COLONOSCOPY  2009   Dr. Arnoldo Morale: normal  . COLONOSCOPY  June 2011   Dr. Gala Romney: normal rectum, left-sided diverticula  . COLONOSCOPY N/A 04/12/2015   Procedure: COLONOSCOPY;  Surgeon: [name], MD;  Location: [facility]ITE;  Service: Endoscopy;  Laterality: N/A;  1115   . CYSTOSCOPY W/ LITHOLAPAXY / EHL  2009  . ESOPHAGOGASTRODUODENOSCOPY  June 2011   Dr. Gala Romney: erosive reflux esophagitis, small hiatal hernia, +H.pylori gastritis  . EYE SURGERY    . HOLMIUM LASER APPLICATION Right 5465  . LEFT HEART CATHETERIZATION WITH CORONARY ANGIOGRAM N/A 11/09/2013   Procedure: LEFT HEART CATHETERIZATION WITH CORONARY ANGIOGRAM;  Surgeon: [name], MD;  Location: [facility] CATH LAB;  Service: Cardiovascular;  Laterality: N/A;  . LUMBAR DISC SURGERY  X 3  . PERCUTANEOUS CORONARY STENT INTERVENTION (PCI-S) N/A 11/10/2013   Procedure: PERCUTANEOUS CORONARY STENT INTERVENTION (PCI-S);  Surgeon: [name], MD;  Location: [facility] CATH LAB;  Service: Cardiovascular;  Laterality: N/A;  . POSTERIOR LUMBAR FUSION  2014  . RETINAL DETACHMENT SURGERY Right 1998   Secondary to injury detached retina/cataracts  . TONSILLECTOMY    . TUMOR EXCISION  1995    Family History  Problem Relation Age of  Onset  . Colon cancer Mother     deceased   . Cancer Maternal Grandmother     Gastric cancer  . Breast cancer Sister   . Heart attack Father   . Colon cancer Maternal Uncle   . Colon cancer Maternal Uncle   . Colon cancer Other     maternal great uncle  . Colon polyps Sister     Social History:  reports that he has never smoked. His smokeless tobacco use includes Chew. He reports that he does not drink alcohol or use drugs.  Allergies:  Allergies  Allergen Reactions  . Penicillins Hives, Itching and Swelling  . Other     Bee stings  . Atorvastatin Other (See Comments)    Muscle pain/cramps   . Celexa [Citalopram] Diarrhea    Medications: Prior to Admission medications   Medication Sig Start Date End Date Taking? Authorizing Provider  amLODipine (NORVASC) 10 MG tablet Take 1 tablet (10 mg total) by mouth daily. 03/18/16  Yes [name], MD  baclofen (LIORESAL) 20 MG tablet Take 20 mg by mouth 3 (three) times daily.   Yes Historical Provider, MD  carvedilol (COREG) 12.5 MG tablet TAKE 1 TABLET  BY MOUTH TWICE DAILY WITH MEALS. 03/18/16  Yes [name], MD  DULoxetine (CYMBALTA) 30 MG capsule Take 1 capsule (30 mg total) by mouth daily. 03/18/16  Yes [name], MD  insulin aspart (NOVOLOG FLEXPEN) 100 UNIT/ML FlexPen INJECT 25 UNITS INTO THE SKIN TWICE DAILY. 03/18/16  Yes [name], MD  insulin glargine (LANTUS) 100 UNIT/ML injection To use 50 units nightly may titrate up to 75 units nightly 03/18/16  Yes Scott A Luking, MD  INVOKANA 300 MG TABS tablet TAKE (1) TABLET BY MOUTH ONCE DAILY. 02/20/16  Yes [name], MD  lisinopril (PRINIVIL,ZESTRIL) 40 MG tablet Take 1 tablet (40 mg total) by mouth daily. 08/15/15  Yes [name], MD  Melatonin 10 MG TABS Take 1 tablet by mouth at bedtime.   Yes Historical Provider, MD  metFORMIN (GLUCOPHAGE) 1000 MG tablet TAKE 1 TABLET BY MOUTH TWICE DAILY WITH A MEAL. 08/15/15  Yes [name], MD  nitroGLYCERIN (NITROSTAT)  0.4 MG SL tablet Place 1 tablet (0.4 mg total) under the tongue every 5 (five) minutes x 3 doses as needed for chest pain. 11/15/14  Yes [name], MD  oxyCODONE-acetaminophen (PERCOCET) 10-325 MG per tablet Take 1 tablet by mouth every 4 (four) hours as needed for pain. Per DR ROY   Yes Historical Provider, MD  pravastatin (PRAVACHOL) 40 MG tablet TAKE 1 TABLET BY MOUTH AT BEDTIME FOR CHOLESTEROL. 08/15/15  Yes [name], MD  VALERIAN ROOT PO Take 1 tablet by mouth at bedtime.   Yes Historical Provider, MD    Scheduled Meds: . amLODipine  10 mg Oral Daily  . carvedilol  12.5 mg Oral BID WC  . ciprofloxacin  400 mg Intravenous Q12H  . DULoxetine  30 mg Oral Daily  . enoxaparin (LOVENOX) injection  70 mg Subcutaneous Q24H  . insulin aspart  0-5 Units Subcutaneous QHS  . insulin aspart  0-9 Units Subcutaneous TID WC  . insulin aspart  6 Units Subcutaneous TID WC  . insulin glargine  50 Units Subcutaneous QHS  . lidocaine  1 patch Transdermal Q24H  . lisinopril  40 mg Oral Daily  . pravastatin  40 mg Oral q1800   Continuous Infusions:  PRN Meds:.acetaminophen **OR** acetaminophen, hydrALAZINE, HYDROmorphone (DILAUDID) injection, ondansetron **OR** ondansetron (ZOFRAN) IV, oxyCODONE-acetaminophen **AND** oxyCODONE, polyethylene glycol     Results for orders placed or performed during the hospital encounter of 03/30/16 (from the past 48 hour(s))  Glucose, capillary     Status: Abnormal   Collection Time: 03/30/16  8:23 PM  Result Value Ref Range   Glucose-Capillary 174 (H) 65 - 99 mg/dL   Comment 1 Notify RN   CBC WITH DIFFERENTIAL     Status: Abnormal   Collection Time: 03/31/16  6:14 AM  Result Value Ref Range   WBC 10.2 4.0 - 10.5 K/uL   RBC 4.23 4.22 - 5.81 MIL/uL   Hemoglobin 10.4 (L) 13.0 - 17.0 g/dL   HCT 34.9 (L) 39.0 - 52.0 %   MCV 82.5 78.0 - 100.0 fL   MCH 24.6 (L) 26.0 - 34.0 pg   MCHC 29.8 (L) 30.0 - 36.0 g/dL   RDW 17.0 (H) 11.5 - 15.5 %   Platelets 348  150 - 400 K/uL   Neutrophils Relative % 66 %   Neutro Abs 6.8 1.7 - 7.7 K/uL   Lymphocytes Relative 19 %   Lymphs Abs 1.9 0.7 - 4.0 K/uL   Monocytes Relative 14 %  Monocytes Absolute 1.4 (H) 0.1 - 1.0 K/uL   Eosinophils Relative 1 %   Eosinophils Absolute 0.1 0.0 - 0.7 K/uL   Basophils Relative 0 %   Basophils Absolute 0.0 0.0 - 0.1 K/uL  Glucose, capillary     Status: Abnormal   Collection Time: 03/31/16  7:44 AM  Result Value Ref Range   Glucose-Capillary 114 (H) 65 - 99 mg/dL   Comment 1 Notify RN   Glucose, capillary     Status: None   Collection Time: 03/31/16 11:11 AM  Result Value Ref Range   Glucose-Capillary 84 65 - 99 mg/dL  Glucose, capillary     Status: Abnormal   Collection Time: 03/31/16  5:13 PM  Result Value Ref Range   Glucose-Capillary 131 (H) 65 - 99 mg/dL   Comment 1 Notify RN   Glucose, capillary     Status: Abnormal   Collection Time: 03/31/16 10:18 PM  Result Value Ref Range   Glucose-Capillary 120 (H) 65 - 99 mg/dL  Basic metabolic panel     Status: Abnormal   Collection Time: 04/01/16  6:32 AM  Result Value Ref Range   Sodium 139 135 - 145 mmol/L   Potassium 3.4 (L) 3.5 - 5.1 mmol/L   Chloride 105 101 - 111 mmol/L   CO2 27 22 - 32 mmol/L   Glucose, Bld 137 (H) 65 - 99 mg/dL   BUN 9 6 - 20 mg/dL   Creatinine, Ser 0.70 0.61 - 1.24 mg/dL   Calcium 8.3 (L) 8.9 - 10.3 mg/dL   GFR calc non Af Amer >60 >60 mL/min   GFR calc Af Amer >60 >60 mL/min    Comment: (NOTE) The eGFR has been calculated using the CKD EPI equation. This calculation has not been validated in all clinical situations. eGFR's persistently <60 mL/min signify possible Chronic Kidney Disease.    Anion gap 7 5 - 15  CBC     Status: Abnormal   Collection Time: 04/01/16  6:32 AM  Result Value Ref Range   WBC 9.2 4.0 - 10.5 K/uL   RBC 4.27 4.22 - 5.81 MIL/uL   Hemoglobin 10.3 (L) 13.0 - 17.0 g/dL   HCT 35.2 (L) 39.0 - 52.0 %   MCV 82.4 78.0 - 100.0 fL   MCH 24.1 (L) 26.0 - 34.0  pg   MCHC 29.3 (L) 30.0 - 36.0 g/dL   RDW 16.9 (H) 11.5 - 15.5 %   Platelets 333 150 - 400 K/uL  Glucose, capillary     Status: Abnormal   Collection Time: 04/01/16  7:55 AM  Result Value Ref Range   Glucose-Capillary 136 (H) 65 - 99 mg/dL  Glucose, capillary     Status: Abnormal   Collection Time: 04/01/16 11:28 AM  Result Value Ref Range   Glucose-Capillary 146 (H) 65 - 99 mg/dL    Studies/Results:  L SPINE CT FINDINGS: Segmentation: Partially lumbarized S1 vertebral body is noted.  Alignment: Stable grade 1 anterolisthesis of L5-S1 is noted secondary to posterior facet joint hypertrophy. Otherwise good alignment of vertebral bodies is noted.  Vertebrae: No acute fracture is noted. Status post surgical posterior fusion extending from L2-S1 with bilateral intrapedicular screw placement. Interbody fusion is also noted at these levels. Status post laminectomy as well extending from L2-S1. Status post left lateral fusion of L3-4.  Paraspinal and other soft tissues: No definite evidence of significant bony or soft tissue stenosis seen in the central spinal canal.  Disc levels: See above description for  extensive postsurgical changes.  IMPRESSION: Extensive postsurgical and degenerative changes as described above. No acute abnormality seen in the lumbar spine.      HEAD CT IMPRESSION: Atrophy with questionable superimposed normal pressure hydrocephalus. Mild periventricular small vessel disease. No intracranial mass, hemorrhage, or extra-axial fluid collection. No acute infarct evident.  There are foci of arterial vascular calcification.  There are foci of paranasal sinus disease. There is a small focus of metal along the anterior most aspect of the left globe. Direct visualization of this area advised.      The brain MRI is reviewed in person. No acute findings are noted on diffusion imaging. Some of the sequences are degraded such as the  susceptibility and T1 imaging. There is good quality seen on FLAIR imaging and this shows moderate global atrophy. No significant white matter lesions are seen. There may be minimal periventricular increased signal. There is corresponding ventriculomegaly which appears mostly commensurate with a degree of global atrophy.  The CT scan is also reviewed and shows ventriculomegaly slightly out of proportion to the degree of global atrophy.   A. Merlene Powell, M.D.  Diplomate, Tax adviser of Psychiatry and Neurology ( Neurology). 04/01/2016, 6:47 PM

## 2016-04-01 NOTE — Progress Notes (Signed)
Physical Therapy Treatment Patient Details Name: Francisco Powell MRN: [id number] DOB: [birth year] Today's Date: 04/01/2016    History of Present Illness 75 y.o.malewith medical history significant for hypertension, insulin-dependent diabetes mellitus, coronary artery disease, and chronic low back pain status post multiple spinal surgeries who presents to the emergency department with generalized weakness, confusion, and gait instability with recurrent falls. Patient is accompanied by his daughter, a former ICU nurse, who assists with the history. Patient underwent his fourth lumbar spine surgery in August of this year and struggled with significant deconditioning and generalized weakness following this. He had been working with physical therapy and making a steady improvement until recent decline with emotional lability and depression. He was started on Cymbalta 2 weeks ago after a home visit by his PCP and his daughter reports that he seems to have had a very good response to this. Unfortunately, he has again developed generalized weakness over the past 2 days, worsening progressively, and now associated with confusion.  Dx: Acute Encephalopathy.  Head CT negative for acute etiology, but findings suggest possible NPH, but not sure that this would explain the acute confusion    PT Comments    Pt supine in bed and willing to participate with therapist.  Pt limited by lower back pain scale 5/10 and reports received pain meds earlier today.  Session focus on improving core strengthening to improve bed mobility and sitting balance.  Pt with increased delay with bed mobiltiy and required mod to max A with cueing for hand and LE positioning to assist.  Max A with supine to sit and to sit on EOB without posterior lean.  During bed mobility pt.'s catheter removed.  RN informed and completed bed mobility exercises while changing sheets. EOS pt reports pain reduced, pt left in bed with RN in room.     Follow  Up Recommendations  SNF     Equipment Recommendations  None recommended by PT    Recommendations for Other Services       Precautions / Restrictions Precautions Precautions: Fall;Other (comment) Precaution Comments: Pt has had 2 falls just prior to admission, has gone down onto his knees 4 times in the past 6 months.   Restrictions Weight Bearing Restrictions: No    Mobility  Bed Mobility Overal bed mobility: Needs Assistance Bed Mobility: Rolling;Sidelying to Sit;Sit to Sidelying;Sit to Supine Rolling: Min assist (cueing for handplacement with increased time) Sidelying to sit: Max assist;HOB elevated     Sit to sidelying: Max assist General bed mobility comments: Mod/Max A with bed mobilty; Increased time with bed mobiltiy and increased time sitting on the EOB; Max A with sitting to reduce posterior lean  Transfers                    Ambulation/Gait                 Stairs            Wheelchair Mobility    Modified Rankin (Stroke Patients Only)       Balance                                    Cognition Arousal/Alertness: Lethargic Behavior During Therapy: WFL for tasks assessed/performed Overall Cognitive Status: Within Functional Limits for tasks assessed (Increased time for response and poor motor control)  Exercises      General Comments        Pertinent Vitals/Pain Pain Assessment: 0-10 Pain Score: 5  Pain Location: lower back pain Pain Intervention(s): Limited activity within patient's tolerance;Repositioned;Monitored during session    Home Living                      Prior Function            PT Goals (current goals can now be found in the care plan section)      Frequency    Min 5X/week      PT Plan      Co-evaluation             End of Session Equipment Utilized During Treatment: Gait belt Activity Tolerance: Patient limited by pain Patient  left: in bed;with call bell/phone within reach;with nursing/sitter in room     Time: 1355-1440 PT Time Calculation (min) (ACUTE ONLY): 45 min  Charges:  $Therapeutic Activity: 38-52 mins                    G Codes:     Ihor Austin, LPTA; CBIS [phone]  Aldona Lento 04/01/2016, 3:10 PM

## 2016-04-01 NOTE — Clinical Social Work Note (Signed)
Clinical Social Work Assessment  Patient Details  Name: Francisco Powell MRN: [id number] Date of Birth: 1940/08/29  Date of referral:  04/01/16               Reason for consult:  Facility Placement                Permission sought to share information with:    Permission granted to share information::     Name::        Agency::     Relationship::     Contact Information:  Brayten Langa, wife, listed on chart  Housing/Transportation Living arrangements for the past 2 months:  Single Family Home Source of Information:  Patient, Spouse Patient Interpreter Needed:  None Criminal Activity/Legal Involvement Pertinent to Current Situation/Hospitalization:  No - Comment as needed Significant Relationships:  Adult Children, Spouse Lives with:  Spouse Do you feel safe going back to the place where you live?  Yes Need for family participation in patient care:  Yes (Comment)  Care giving concerns:  Mrs. Pearce reports increasing difficulty providing care for patient.    Social Worker assessment / plan:  Patient has back surgery 11 weeks ago and was have PT come to the home three times per week. Patient's wife and children have been assisting with care giving needs. Patient, at baseline, is independent in ambulation and ADLs.  He currently requires great assistance. CSW provided SNF listing and discussed Medicare's three night stay rule as well as private pay options. Patient requested clinicals be sent to [facility] and [facility].   Employment status:  Retired Forensic scientist:  Medicare PT Recommendations:  [place] / Referral to community resources:  [place]  Patient/Family's Response to care: Patient/family is agreeable to go to SNF.   Patient/Family's Understanding of and Emotional Response to Diagnosis, Current Treatment, and Prognosis:  Patient's family understands patient's diagnosis, treatment and prognosis.   Emotional  Assessment Appearance:  Appears stated age Attitude/Demeanor/Rapport:   (Cooperative/pleasant) Affect (typically observed):  Accepting Orientation:  Oriented to Self, Oriented to Place, Oriented to  Time, Oriented to Situation Alcohol / Substance use:  Not Applicable Psych involvement (Current and /or in the community):  No (Comment)  Discharge Needs  Concerns to be addressed:  Discharge Planning Concerns Readmission within the last 30 days:  No Current discharge risk:  None Barriers to Discharge:      Ihor Gully, LCSW 04/01/2016, 11:18 AM

## 2016-04-01 NOTE — Care Management Important Message (Signed)
Important Message  Patient Details  Name: Francisco Powell MRN: [id number] Date of Birth: 1941-05-03   Medicare Important Message Given:  Yes    Ilithyia Titzer, Chauncey [place], RN 04/01/2016, 3:53 PM

## 2016-04-01 NOTE — Clinical Social Work Placement (Signed)
   CLINICAL SOCIAL WORK PLACEMENT  NOTE  Date:  04/01/2016  Patient Details  Name: Francisco Powell MRN: [id number] Date of Birth: [birth year]  Clinical Social Work is seeking post-discharge placement for this patient at the [place] level of care (*CSW will initial, date and re-position this form in  chart as items are completed):  Yes   Patient/family provided with [place] Work Department's list of facilities offering this level of care within the geographic area requested by the patient (or if unable, by the patient's family).  Yes   Patient/family informed of their freedom to choose among providers that offer the needed level of care, that participate in Medicare, Medicaid or managed care program needed by the patient, have an available bed and are willing to accept the patient.  Yes   Patient/family informed of [place]'s ownership interest in [facility] and [facility], as well as of the fact that they are under no obligation to receive care at these facilities.  PASRR submitted to EDS on 04/01/16     PASRR number received on 04/01/16     Existing PASRR number confirmed on       FL2 transmitted to all facilities in geographic area requested by pt/family on 04/01/16     FL2 transmitted to all facilities within larger geographic area on       Patient informed that his/her managed care company has contracts with or will negotiate with certain facilities, including the following:            Patient/family informed of bed offers received.  Patient chooses bed at       Physician recommends and patient chooses bed at      Patient to be transferred to   on  .  Patient to be transferred to facility by       Patient family notified on   of transfer.  Name of family member notified:        PHYSICIAN       Additional Comment:    _______________________________________________ Ihor Gully, LCSW 04/01/2016, 11:16 AM

## 2016-04-01 NOTE — Progress Notes (Signed)
PROGRESS NOTE                                                                                                                                                                                                             Patient Demographics:    Saddiq Ehrhart, is a 75 y.o. male, DOB - 1940/08/25, [id number]  Admit date - 03/30/2016   Admitting Physician Vianne Bulls, MD  Outpatient Primary MD for the patient is Sallee Lange, MD  LOS - 1  Chief Complaint  Patient presents with  . Hip Pain  . Fall       Brief Narrative    PEPPER ERTMAN is a 75 y.o. male with medical history significant for hypertension, insulin-dependent diabetes mellitus, coronary artery disease, and chronic low back pain status post multiple spinal surgeries who presents to the emergency department with generalized weakness, confusion, and gait instability with recurrent falls. Patient is accompanied by his daughter, a former ICU nurse, who assists with the history. Patient underwent his fourth lumbar spine surgery in August of this year and struggled with significant deconditioning and generalized weakness following this. He had been working with physical therapy and making a steady improvement until recent decline with emotional lability and depression. He was started on Cymbalta 2 weeks ago after a home visit by his PCP and his daughter reports that he seems to have had a very good response to this. Unfortunately, he has again developed generalized weakness over the past 2 days, worsening progressively, and now associated with confusion. Has had difficulty getting out of bed or out of a chair for the past couple days and his gait has been very unsteady. He has fallen twice over the last 24 hours, once striking his head, but not losing consciousness. Further workup showed CT scan of the head suspicious for NPH and he was admitted to the hospital.     Subjective:    Pat Kocher today has, No headache, No chest pain, No abdominal pain - No Nausea, No new weakness tingling or numbness, No Cough - SOB. Does have some generalized weakness, chronic low back pain and chronically weak right lower extremity as compared to left.   Assessment  & Plan :     1. Encephalopathy with Gen weakness, Falls, possible UTI - CT scan head suggestive of possible NPH,  TSH is stable and B-12, folate and RPR are pending, no focal deficits except chronic mild right lower extremity weakness. We will get brain MRI, PTOT consult, neuro evaluation. Mentation has improved.  2. ? UTI. For now on Cipro, monitor cultures.  3. Dehydration, hydrated with IV fluids and monitor.  4. Essential hypertension - he is currently on combination of Norvasc, Coreg and lisinopril. Will continue and monitor.  5. Hypokalemia. Replaced will monitor.  6. Chronic low back pain. Multiple L-spine surgeries and chronic right lower leg weakness. Supportive care.  7. CAD with bifascicular block on EKG. Chronic. Status post drug-eluting stent to RCA and LAD in 2015. Continue Coreg, statin for secondary prevention. He follows with Dr. Domenic Polite. No acute issues.  8. DM type II. On Lantus and sliding scale will continue and monitor.  Lab Results  Component Value Date   HGBA1C 6.6 (H) 01/08/2016   CBG (last 3)   Recent Labs  03/31/16 1713 03/31/16 2218 04/01/16 0755  GLUCAP 131* 120* 136*      Family Communication  :  None  Code Status :  Full  Diet : Heart Healthy  Disposition Plan  :  TBD  Consults  :  Neuro  Procedures  :   CT head. Questionable NPH. CT abdomen pelvis and L-spine. Nonacute. MRI brain ordered.  DVT Prophylaxis  :  Lovenox    Lab Results  Component Value Date   PLT 333 04/01/2016    Inpatient Medications  Scheduled Meds:  . amLODipine  10 mg Oral Daily  . carvedilol  12.5 mg Oral BID WC  . ciprofloxacin  400 mg Intravenous Q12H  .  DULoxetine  30 mg Oral Daily  . enoxaparin (LOVENOX) injection  70 mg Subcutaneous Q24H  . insulin aspart  0-5 Units Subcutaneous QHS  . insulin aspart  0-9 Units Subcutaneous TID WC  . insulin aspart  6 Units Subcutaneous TID WC  . insulin glargine  50 Units Subcutaneous QHS  . lidocaine  1 patch Transdermal Q24H  . lisinopril  40 mg Oral Daily  . potassium chloride  20 mEq Oral Once  . pravastatin  40 mg Oral q1800   Continuous Infusions: . sodium chloride 75 mL/hr at 04/01/16 0008   PRN Meds:.acetaminophen **OR** acetaminophen, hydrALAZINE, HYDROmorphone (DILAUDID) injection, LORazepam, ondansetron **OR** ondansetron (ZOFRAN) IV, oxyCODONE-acetaminophen **AND** oxyCODONE, polyethylene glycol  Antibiotics  :    Anti-infectives    Start     Dose/Rate Route Frequency Ordered Stop   03/31/16 0400  ciprofloxacin (CIPRO) IVPB 400 mg     400 mg 200 mL/hr over 60 Minutes Intravenous Every 12 hours 03/30/16 1707     03/30/16 1600  ciprofloxacin (CIPRO) IVPB 400 mg     400 mg 200 mL/hr over 60 Minutes Intravenous  Once 03/30/16 1557 03/30/16 1726         Objective:   Vitals:   03/31/16 0354 03/31/16 1954 03/31/16 2218 04/01/16 0500  BP: (!) 156/67  132/73 (!) 175/76  Pulse: 63  60 63  Resp: 16  18 18   Temp: 98.6 F (37 C)  98 F (36.7 C) 98.9 F (37.2 C)  TempSrc: Oral  Oral Oral  SpO2: 95% 95% 94% 94%  Weight: 114.8 kg (253 lb 1.4 oz)   116.2 kg (256 lb 2.8 oz)  Height:        Wt Readings from Last 3 Encounters:  04/01/16 116.2 kg (256 lb 2.8 oz)  09/11/15 130.6 kg (288 lb)  08/15/15 129.7  kg (286 lb)     Intake/Output Summary (Last 24 hours) at 04/01/16 0927 Last data filed at 04/01/16 0500  Gross per 24 hour  Intake           1697.5 ml  Output              850 ml  Net            847.5 ml     Physical Exam  Awake Alert, Oriented X 3, No new F.N deficits, Normal affect [place].AT,PERRAL Supple Neck,No JVD, No cervical lymphadenopathy appriciated.  Symmetrical  Chest wall movement, Good air movement bilaterally, CTAB RRR,No Gallops,Rubs or new Murmurs, No Parasternal Heave +ve B.Sounds, Abd Soft, No tenderness, No organomegaly appriciated, No rebound - guarding or rigidity. No Cyanosis, Clubbing or edema, No new Rash or bruise, Chronic low back pain.    Data Review:   CBC  Recent Labs Lab 03/30/16 1104 03/31/16 0614 04/01/16 0632  WBC 14.6* 10.2 9.2  HGB 11.3* 10.4* 10.3*  HCT 37.8* 34.9* 35.2*  PLT 362 348 333  MCV 83.1 82.5 82.4  MCH 24.8* 24.6* 24.1*  MCHC 29.9* 29.8* 29.3*  RDW 17.1* 17.0* 16.9*  LYMPHSABS 2.1 1.9  --   MONOABS 1.6* 1.4*  --   EOSABS 0.1 0.1  --   BASOSABS 0.1 0.0  --     Chemistries   Recent Labs Lab 03/30/16 1104 04/01/16 0632  NA 143 139  K 3.3* 3.4*  CL 102 105  CO2 30 27  GLUCOSE 124* 137*  BUN 15 9  CREATININE 0.70 0.70  CALCIUM 8.6* 8.3*  MG 1.8  --   AST 21  --   ALT 12*  --   ALKPHOS 74  --   BILITOT 0.5  --    ------------------------------------------------------------------------------------------------------------------ No results for input(s): CHOL, HDL, LDLCALC, TRIG, CHOLHDL, LDLDIRECT in the last 72 hours.  Lab Results  Component Value Date   HGBA1C 6.6 (H) 01/08/2016   ------------------------------------------------------------------------------------------------------------------ No results for input(s): TSH, T4TOTAL, T3FREE, THYROIDAB in the last 72 hours.  Invalid input(s): FREET3 ------------------------------------------------------------------------------------------------------------------ No results for input(s): VITAMINB12, FOLATE, FERRITIN, TIBC, IRON, RETICCTPCT in the last 72 hours.  Coagulation profile No results for input(s): INR, PROTIME in the last 168 hours.  No results for input(s): DDIMER in the last 72 hours.  Cardiac Enzymes  Recent Labs Lab 03/30/16 1104  TROPONINI 0.05*    ------------------------------------------------------------------------------------------------------------------ No results found for: BNP  Micro Results  Recent Results (from the past 240 hour(s))  Urine culture     Status: None   Collection Time: 03/30/16 12:40 PM  Result Value Ref Range Status   Specimen Description URINE, CLEAN CATCH  Final   Special Requests NONE  Final   Culture   Final    NO GROWTH 1 DAY Performed at [facility]    Report Status 04/01/2016 FINAL  Final    Radiology Reports  Dg Chest 2 View  Result Date: 03/30/2016 CLINICAL DATA:  Severe right lower quadrant abdominal pain. Generalized weakness. EXAM: CHEST  2 VIEW COMPARISON:  01/15/2016 FINDINGS: Pulmonary vascularity is slightly prominent but the patient is supine. Overall heart size is normal. No infiltrates or effusions. No acute bone abnormality. IMPRESSION: No acute disease in the chest. Electronically Signed   By: Lorriane Shire M.D.   On: 03/30/2016 12:42   Ct Head Wo Contrast  Result Date: 03/30/2016 CLINICAL DATA:  Pain following fall EXAM: CT HEAD WITHOUT CONTRAST TECHNIQUE: Contiguous axial images were  obtained from the base of the skull through the vertex without intravenous contrast. COMPARISON:  None. FINDINGS: Brain: There is moderate diffuse atrophy with ventricles somewhat prominent with respect to the sulci. There is no intracranial mass, hemorrhage, extra-axial fluid collection, or midline shift. There is mild small vessel disease in the centra semiovale bilaterally. Elsewhere gray-white compartments appear normal. No acute infarct is evident on this study. Vascular: There is no demonstrable hyperdense vessel. There is calcification in the distal left vertebral artery. The vertebral arteries are quite tortuous. There is also calcification in the carotid siphon regions bilaterally. Skull: The bony calvarium appears intact. Sinuses/Orbits: There is mild mucosal thickening in several  ethmoid air cells bilaterally. There is mild mucosal thickening in the posterior left maxillary antrum and in the right posterior sphenoid sinus. Orbits appear overall symmetric bilaterally. A small focus of metal is noted along the anterior most aspect of the left globe. Other: Mastoid air cells are clear. IMPRESSION: Atrophy with questionable superimposed normal pressure hydrocephalus. Mild periventricular small vessel disease. No intracranial mass, hemorrhage, or extra-axial fluid collection. No acute infarct evident. There are foci of arterial vascular calcification. There are foci of paranasal sinus disease. There is a small focus of metal along the anterior most aspect of the left globe. Direct visualization of this area advised. Electronically Signed   By: Lowella Grip III M.D.   On: 03/30/2016 12:22   Ct Abdomen Pelvis W Contrast  Result Date: 03/30/2016 CLINICAL DATA:  Worsening back and posterior pelvic pain with leukocytosis, confusion and recent fall. Previous back surgery. EXAM: CT ABDOMEN AND PELVIS WITH CONTRAST TECHNIQUE: Multidetector CT imaging of the abdomen and pelvis was performed using the standard protocol following bolus administration of intravenous contrast. CONTRAST:  122mL ISOVUE-300 IOPAMIDOL (ISOVUE-300) INJECTION 61% COMPARISON:  Abdominal pelvic CT 10/29/2009. Lumbar spine MRI 07/21/2012 and CT 12/20/2015. FINDINGS: Lower chest: Mild atelectasis at both lung bases. The heart is enlarged. There is atherosclerosis of the aorta and coronary arteries. No significant pleural or pericardial effusion. Hepatobiliary: No suspicious hepatic findings. There are scattered calcified granulomas. Previous cholecystectomy without significant biliary dilatation. Pancreas: Unremarkable. No pancreatic ductal dilatation or surrounding inflammatory changes. Spleen: Normal in size without focal abnormality. Adrenals/Urinary Tract: Both adrenal glands appear normal. There is an enlarging  nonobstructing 10 mm calculus in the lower pole of the left kidney (image 43). No other urinary tract calculi are seen. There is no evidence of hydronephrosis or perinephric soft tissue stranding. There are several left renal cysts, measuring up to 4.7 x 4.0 cm on image 36. No suspicious renal findings. The bladder appears unremarkable. Stomach/Bowel: No evidence of bowel wall thickening, distention or surrounding inflammatory change. The proximal colon is decompressed. The appendix is not seen. Vascular/Lymphatic: There are no enlarged abdominal or pelvic lymph nodes. Aortic and branch vessel atherosclerosis. No evidence of large vessel occlusion. Reproductive: Mild heterogeneous enlargement of the prostate gland. The seminal vesicles appear normal. Other: There is mildly prominent fat in both inguinal canals. There is no herniated bowel. No ascites. Musculoskeletal: No acute osseous findings are demonstrated. There are extensive postsurgical changes within the lumbar spine status post L2 through S1 fusion. Lumbar spine findings are dictated in greater detail on the separate examination of the lumbar spine. IMPRESSION: 1. No acute findings or explanation for the patient's symptoms. 2. Postsurgical and degenerative changes throughout the lumbar spine. Lumbar spine details are dictated separately. 3. Nonobstructing left renal calculus.  Left renal cysts. 4.  Aortic Atherosclerosis (ICD10-170.0) Electronically Signed  By: Richardean Sale M.D.   On: 03/30/2016 16:03   Ct L-spine No Charge  Result Date: 03/30/2016 CLINICAL DATA:  Back pain. EXAM: CT LUMBAR SPINE WITHOUT CONTRAST TECHNIQUE: Multidetector CT imaging of the lumbar spine was performed without intravenous contrast administration. Multiplanar CT image reconstructions were also generated. COMPARISON:  Radiographs of March 05, 2016. FINDINGS: Segmentation: Partially lumbarized S1 vertebral body is noted. Alignment: Stable grade 1 anterolisthesis of  L5-S1 is noted secondary to posterior facet joint hypertrophy. Otherwise good alignment of vertebral bodies is noted. Vertebrae: No acute fracture is noted. Status post surgical posterior fusion extending from L2-S1 with bilateral intrapedicular screw placement. Interbody fusion is also noted at these levels. Status post laminectomy as well extending from L2-S1. Status post left lateral fusion of L3-4. Paraspinal and other soft tissues: No definite evidence of significant bony or soft tissue stenosis seen in the central spinal canal. Disc levels: See above description for extensive postsurgical changes. IMPRESSION: Extensive postsurgical and degenerative changes as described above. No acute abnormality seen in the lumbar spine. Electronically Signed   By: Marijo Conception, M.D.   On: 03/30/2016 15:56    Time Spent in minutes  30   Antwanette Wesche K M.D on 04/01/2016 at 9:27 AM  Between 7am to 7pm - Pager - [phone]  After 7pm go to [email or web address] - password [facility]  [facility] -  Office  [phone]

## 2016-04-02 LAB — GLUCOSE, CAPILLARY
GLUCOSE-CAPILLARY: 137 mg/dL — AB (ref 65–99)
GLUCOSE-CAPILLARY: 186 mg/dL — AB (ref 65–99)
Glucose-Capillary: 131 mg/dL — ABNORMAL HIGH (ref 65–99)
Glucose-Capillary: 163 mg/dL — ABNORMAL HIGH (ref 65–99)

## 2016-04-02 LAB — POTASSIUM: POTASSIUM: 3.6 mmol/L (ref 3.5–5.1)

## 2016-04-02 LAB — MAGNESIUM: Magnesium: 1.8 mg/dL (ref 1.7–2.4)

## 2016-04-02 LAB — VITAMIN B12: Vitamin B-12: 142 pg/mL — ABNORMAL LOW (ref 180–914)

## 2016-04-02 MED ORDER — POTASSIUM CHLORIDE CRYS ER 20 MEQ PO TBCR
20.0000 meq | EXTENDED_RELEASE_TABLET | Freq: Once | ORAL | Status: AC
  Administered 2016-04-02: 20 meq via ORAL
  Filled 2016-04-02: qty 1

## 2016-04-02 NOTE — Progress Notes (Signed)
PROGRESS NOTE                                                                                                                                                                                                             Patient Demographics:    Francisco Powell, is a 75 y.o. male, DOB - 01-28-1941, [id number]  Admit date - 03/30/2016   Admitting Physician Vianne Bulls, MD  Outpatient Primary MD for the patient is Sallee Lange, MD  LOS - 2  Chief Complaint  Patient presents with  . Hip Pain  . Fall       Brief Narrative    Francisco Powell is a 75 y.o. male with medical history significant for hypertension, insulin-dependent diabetes mellitus, coronary artery disease, and chronic low back pain status post multiple spinal surgeries who presents to the emergency department with generalized weakness, confusion, and gait instability with recurrent falls. Patient is accompanied by his daughter, a former ICU nurse, who assists with the history. Patient underwent his fourth lumbar spine surgery in August of this year and struggled with significant deconditioning and generalized weakness following this. He had been working with physical therapy and making a steady improvement until recent decline with emotional lability and depression.   He was started on Cymbalta 2 weeks ago after a home visit by his PCP and his daughter reports that he seems to have had a very good response to this. Unfortunately, he has again developed generalized weakness over the past 2 days, worsening progressively, and now associated with confusion. Has had difficulty getting out of bed or out of a chair for the past couple days and his gait has been very unsteady. He has fallen twice over the last 24 hours, once striking his head, but not losing consciousness. Further workup showed CT scan of the head suspicious for NPH and he was admitted to the hospital.      Subjective:    Francisco Powell today has, No headache, No chest pain, No abdominal pain - No Nausea, No new weakness tingling or numbness, No Cough - SOB. Does have some generalized weakness, chronic low back pain and chronically weak right lower extremity as compared to left.   Assessment  & Plan :     1. Encephalopathy with Gen weakness, Falls, possible UTI - CT scan head suggestive  of possible NPH, TSH is stable and B-12, folate and RPR are pending, no focal deficits except chronic mild right lower extremity weakness. Mentation has improved. MRI nonacute, seen by neurology who think enlarged ventricles are likely due to age-related dementia and volume loss and not NPH. Continue PT, will require placement to SNF.  2. ? UTI. For now on Cipro, monitor cultures.  3. Dehydration, hydrated with IV fluids and monitor.  4. Essential hypertension - he is currently on combination of Norvasc, Coreg and lisinopril. Will continue and monitor.  5. Hypokalemia. Replaced & stable.  6. Chronic low back pain. Multiple L-spine surgeries and chronic right lower leg weakness. Supportive care.  7. CAD with bifascicular block on EKG. Chronic. Status post drug-eluting stent to RCA and LAD in 2015. Continue Coreg, statin for secondary prevention. He follows with Dr. Domenic Polite. No acute issues.  8. DM type II. On Lantus and sliding scale will continue and monitor.  Lab Results  Component Value Date   HGBA1C 6.6 (H) 01/08/2016   CBG (last 3)   Recent Labs  04/01/16 1906 04/01/16 2052 04/02/16 0826  GLUCAP 131* 148* 131*      Family Communication  :  None  Code Status :  Full  Diet : Heart Healthy  Disposition Plan  :  TBD  Consults  :  Neuro  Procedures  :   CT head. Questionable NPH. CT abdomen pelvis and L-spine. Nonacute. MRI brain non acute.  DVT Prophylaxis  :  Lovenox    Lab Results  Component Value Date   PLT 333 04/01/2016    Inpatient Medications  Scheduled  Meds:  . amLODipine  10 mg Oral Daily  . carvedilol  12.5 mg Oral BID WC  . DULoxetine  30 mg Oral Daily  . enoxaparin (LOVENOX) injection  70 mg Subcutaneous Q24H  . insulin aspart  0-5 Units Subcutaneous QHS  . insulin aspart  0-9 Units Subcutaneous TID WC  . insulin aspart  6 Units Subcutaneous TID WC  . insulin glargine  50 Units Subcutaneous QHS  . lidocaine  1 patch Transdermal Q24H  . lisinopril  40 mg Oral Daily  . pravastatin  40 mg Oral q1800   Continuous Infusions:   PRN Meds:.acetaminophen **OR** acetaminophen, hydrALAZINE, HYDROmorphone (DILAUDID) injection, ondansetron **OR** ondansetron (ZOFRAN) IV, oxyCODONE-acetaminophen **AND** oxyCODONE, polyethylene glycol  Antibiotics  :    Anti-infectives    Start     Dose/Rate Route Frequency Ordered Stop   03/31/16 0400  ciprofloxacin (CIPRO) IVPB 400 mg     400 mg 200 mL/hr over 60 Minutes Intravenous Every 12 hours 03/30/16 1707 04/02/16 0600   03/30/16 1600  ciprofloxacin (CIPRO) IVPB 400 mg     400 mg 200 mL/hr over 60 Minutes Intravenous  Once 03/30/16 1557 03/30/16 1726         Objective:   Vitals:   04/01/16 1526 04/01/16 1956 04/01/16 2150 04/02/16 0435  BP: (!) 158/61  (!) 158/74 (!) 155/66  Pulse: 62  69 65  Resp: 20  20 20   Temp: 98.7 F (37.1 C)  97.9 F (36.6 C) 99 F (37.2 C)  TempSrc:   Oral Oral  SpO2: 97% (!) 89% 97% 96%  Weight:      Height:        Wt Readings from Last 3 Encounters:  04/01/16 116.2 kg (256 lb 2.8 oz)  09/11/15 130.6 kg (288 lb)  08/15/15 129.7 kg (286 lb)     Intake/Output Summary (Last 24  hours) at 04/02/16 1111 Last data filed at 04/02/16 0855  Gross per 24 hour  Intake          1178.75 ml  Output              200 ml  Net           978.75 ml     Physical Exam  Awake Alert, Oriented X 3, No new F.N deficits, Normal affect [place].AT,PERRAL Supple Neck,No JVD, No cervical lymphadenopathy appriciated.  Symmetrical Chest wall movement, Good air movement  bilaterally, CTAB RRR,No Gallops,Rubs or new Murmurs, No Parasternal Heave +ve B.Sounds, Abd Soft, No tenderness, No organomegaly appriciated, No rebound - guarding or rigidity. No Cyanosis, Clubbing or edema, No new Rash or bruise, Chronic low back pain.    Data Review:   CBC  Recent Labs Lab 03/30/16 1104 03/31/16 0614 04/01/16 0632  WBC 14.6* 10.2 9.2  HGB 11.3* 10.4* 10.3*  HCT 37.8* 34.9* 35.2*  PLT 362 348 333  MCV 83.1 82.5 82.4  MCH 24.8* 24.6* 24.1*  MCHC 29.9* 29.8* 29.3*  RDW 17.1* 17.0* 16.9*  LYMPHSABS 2.1 1.9  --   MONOABS 1.6* 1.4*  --   EOSABS 0.1 0.1  --   BASOSABS 0.1 0.0  --     Chemistries   Recent Labs Lab 03/30/16 1104 04/01/16 0632 04/02/16 0643  NA 143 139  --   K 3.3* 3.4* 3.6  CL 102 105  --   CO2 30 27  --   GLUCOSE 124* 137*  --   BUN 15 9  --   CREATININE 0.70 0.70  --   CALCIUM 8.6* 8.3*  --   MG 1.8  --  1.8  AST 21  --   --   ALT 12*  --   --   ALKPHOS 74  --   --   BILITOT 0.5  --   --    ------------------------------------------------------------------------------------------------------------------ No results for input(s): CHOL, HDL, LDLCALC, TRIG, CHOLHDL, LDLDIRECT in the last 72 hours.  Lab Results  Component Value Date   HGBA1C 6.6 (H) 01/08/2016   ------------------------------------------------------------------------------------------------------------------ No results for input(s): TSH, T4TOTAL, T3FREE, THYROIDAB in the last 72 hours.  Invalid input(s): FREET3 ------------------------------------------------------------------------------------------------------------------ No results for input(s): VITAMINB12, FOLATE, FERRITIN, TIBC, IRON, RETICCTPCT in the last 72 hours.  Coagulation profile No results for input(s): INR, PROTIME in the last 168 hours.  No results for input(s): DDIMER in the last 72 hours.  Cardiac Enzymes  Recent Labs Lab 03/30/16 1104  TROPONINI 0.05*    ------------------------------------------------------------------------------------------------------------------ No results found for: BNP  Micro Results  Recent Results (from the past 240 hour(s))  Urine culture     Status: None   Collection Time: 03/30/16 12:40 PM  Result Value Ref Range Status   Specimen Description URINE, CLEAN CATCH  Final   Special Requests NONE  Final   Culture   Final    NO GROWTH 1 DAY Performed at [facility]    Report Status 04/01/2016 FINAL  Final    Radiology Reports  Dg Chest 2 View  Result Date: 03/30/2016 CLINICAL DATA:  Severe right lower quadrant abdominal pain. Generalized weakness. EXAM: CHEST  2 VIEW COMPARISON:  01/15/2016 FINDINGS: Pulmonary vascularity is slightly prominent but the patient is supine. Overall heart size is normal. No infiltrates or effusions. No acute bone abnormality. IMPRESSION: No acute disease in the chest. Electronically Signed   By: Lorriane Shire M.D.   On: 03/30/2016 12:42  Ct Head Wo Contrast  Result Date: 03/30/2016 CLINICAL DATA:  Pain following fall EXAM: CT HEAD WITHOUT CONTRAST TECHNIQUE: Contiguous axial images were obtained from the base of the skull through the vertex without intravenous contrast. COMPARISON:  None. FINDINGS: Brain: There is moderate diffuse atrophy with ventricles somewhat prominent with respect to the sulci. There is no intracranial mass, hemorrhage, extra-axial fluid collection, or midline shift. There is mild small vessel disease in the centra semiovale bilaterally. Elsewhere gray-white compartments appear normal. No acute infarct is evident on this study. Vascular: There is no demonstrable hyperdense vessel. There is calcification in the distal left vertebral artery. The vertebral arteries are quite tortuous. There is also calcification in the carotid siphon regions bilaterally. Skull: The bony calvarium appears intact. Sinuses/Orbits: There is mild mucosal thickening in several  ethmoid air cells bilaterally. There is mild mucosal thickening in the posterior left maxillary antrum and in the right posterior sphenoid sinus. Orbits appear overall symmetric bilaterally. A small focus of metal is noted along the anterior most aspect of the left globe. Other: Mastoid air cells are clear. IMPRESSION: Atrophy with questionable superimposed normal pressure hydrocephalus. Mild periventricular small vessel disease. No intracranial mass, hemorrhage, or extra-axial fluid collection. No acute infarct evident. There are foci of arterial vascular calcification. There are foci of paranasal sinus disease. There is a small focus of metal along the anterior most aspect of the left globe. Direct visualization of this area advised. Electronically Signed   By: Lowella Grip III M.D.   On: 03/30/2016 12:22   Mr Brain Wo Contrast  Result Date: 04/02/2016 CLINICAL DATA:  75 year old male with confusion, generalized weakness, gait instability, recurrent falls. Multiple prior lumbar spine surgeries. Question of normal pressure hydrocephalus raised on noncontrast head CT. Initial encounter. EXAM: MRI HEAD WITHOUT CONTRAST TECHNIQUE: Multiplanar, multiecho pulse sequences of the brain and surrounding structures were obtained without intravenous contrast. COMPARISON:  Noncontrast head CT 03/30/2016 FINDINGS: Study is intermittently degraded by motion artifact despite repeated imaging attempts. Brain: No restricted diffusion to suggest acute infarction. No midline shift, mass effect, evidence of mass lesion, extra-axial collection or acute intracranial hemorrhage. Cervicomedullary junction and pituitary are within normal limits. The lateral and third ventricles are prominent as seen on the recent CT, but the temporal horns appear fairly normal. No evidence of hyperdynamic flow at the cerebral aqueduct. The fourth ventricle is normal. No transependymal edema. Minimal periventricular and mild for age other  patchy nonspecific mostly anterior frontal lobe white matter T2 and FLAIR hyperintensity. No cortical encephalomalacia or chronic cerebral blood products identified. T2 heterogeneity in the deep gray matter nuclei appears mostly due to perivascular spaces although there is evidence of a small chronic lacunar infarct in the right basal ganglia. Negative brainstem and cerebellum. Vascular: Major intracranial vascular flow voids are preserved with generalized intracranial artery tortuosity. The distal left vertebral artery is dominant and dolichoectatic. Skull and upper cervical spine: Not well visualized. Normal bone marrow signal at the skullbase and in the upper cervical spine. Sinuses/Orbits: Negative orbits soft tissues, postoperative changes to the left globe. Trace paranasal sinus mucosal thickening. Other: Visible internal auditory structures appear normal. Mastoids are clear. Negative scalp soft tissues. IMPRESSION: 1. In the appropriate clinical setting normal pressure hydrocephalus would be difficult to exclude. Prominence of the lateral and third ventricles as seen on CT, but otherwise lacking strong imaging evidence of NPH. 2. No acute intracranial abnormality. Generalized intracranial artery dolichoectasia. Mild for age chronic small vessel disease. 3. Motion  degraded exam. Electronically Signed   By: Genevie Ann M.D.   On: 04/02/2016 09:08   Ct Abdomen Pelvis W Contrast  Result Date: 03/30/2016 CLINICAL DATA:  Worsening back and posterior pelvic pain with leukocytosis, confusion and recent fall. Previous back surgery. EXAM: CT ABDOMEN AND PELVIS WITH CONTRAST TECHNIQUE: Multidetector CT imaging of the abdomen and pelvis was performed using the standard protocol following bolus administration of intravenous contrast. CONTRAST:  123mL ISOVUE-300 IOPAMIDOL (ISOVUE-300) INJECTION 61% COMPARISON:  Abdominal pelvic CT 10/29/2009. Lumbar spine MRI 07/21/2012 and CT 12/20/2015. FINDINGS: Lower chest: Mild  atelectasis at both lung bases. The heart is enlarged. There is atherosclerosis of the aorta and coronary arteries. No significant pleural or pericardial effusion. Hepatobiliary: No suspicious hepatic findings. There are scattered calcified granulomas. Previous cholecystectomy without significant biliary dilatation. Pancreas: Unremarkable. No pancreatic ductal dilatation or surrounding inflammatory changes. Spleen: Normal in size without focal abnormality. Adrenals/Urinary Tract: Both adrenal glands appear normal. There is an enlarging nonobstructing 10 mm calculus in the lower pole of the left kidney (image 43). No other urinary tract calculi are seen. There is no evidence of hydronephrosis or perinephric soft tissue stranding. There are several left renal cysts, measuring up to 4.7 x 4.0 cm on image 36. No suspicious renal findings. The bladder appears unremarkable. Stomach/Bowel: No evidence of bowel wall thickening, distention or surrounding inflammatory change. The proximal colon is decompressed. The appendix is not seen. Vascular/Lymphatic: There are no enlarged abdominal or pelvic lymph nodes. Aortic and branch vessel atherosclerosis. No evidence of large vessel occlusion. Reproductive: Mild heterogeneous enlargement of the prostate gland. The seminal vesicles appear normal. Other: There is mildly prominent fat in both inguinal canals. There is no herniated bowel. No ascites. Musculoskeletal: No acute osseous findings are demonstrated. There are extensive postsurgical changes within the lumbar spine status post L2 through S1 fusion. Lumbar spine findings are dictated in greater detail on the separate examination of the lumbar spine. IMPRESSION: 1. No acute findings or explanation for the patient's symptoms. 2. Postsurgical and degenerative changes throughout the lumbar spine. Lumbar spine details are dictated separately. 3. Nonobstructing left renal calculus.  Left renal cysts. 4.  Aortic Atherosclerosis  (ICD10-170.0) Electronically Signed   By: Richardean Sale M.D.   On: 03/30/2016 16:03   Ct L-spine No Charge  Result Date: 03/30/2016 CLINICAL DATA:  Back pain. EXAM: CT LUMBAR SPINE WITHOUT CONTRAST TECHNIQUE: Multidetector CT imaging of the lumbar spine was performed without intravenous contrast administration. Multiplanar CT image reconstructions were also generated. COMPARISON:  Radiographs of March 05, 2016. FINDINGS: Segmentation: Partially lumbarized S1 vertebral body is noted. Alignment: Stable grade 1 anterolisthesis of L5-S1 is noted secondary to posterior facet joint hypertrophy. Otherwise good alignment of vertebral bodies is noted. Vertebrae: No acute fracture is noted. Status post surgical posterior fusion extending from L2-S1 with bilateral intrapedicular screw placement. Interbody fusion is also noted at these levels. Status post laminectomy as well extending from L2-S1. Status post left lateral fusion of L3-4. Paraspinal and other soft tissues: No definite evidence of significant bony or soft tissue stenosis seen in the central spinal canal. Disc levels: See above description for extensive postsurgical changes. IMPRESSION: Extensive postsurgical and degenerative changes as described above. No acute abnormality seen in the lumbar spine. Electronically Signed   By: Marijo Conception, M.D.   On: 03/30/2016 15:56    Time Spent in minutes  30   Malon Branton K M.D on 04/02/2016 at 11:11 AM  Between 7am to 7pm - Pager -  [phone]  After 7pm go to [email or web address] - password [facility]  [facility] -  Office  [phone]

## 2016-04-03 ENCOUNTER — Inpatient Hospital Stay
Admission: RE | Admit: 2016-04-03 | Discharge: 2016-04-17 | Disposition: A | Discharge status: Nursing Home (Includes ICF) | Payer: Medicare Other | Source: Ambulatory Visit | Attending: Internal Medicine | Admitting: Internal Medicine

## 2016-04-03 ENCOUNTER — Other Ambulatory Visit: Payer: Self-pay

## 2016-04-03 DIAGNOSIS — A0472 Enterocolitis due to Clostridium difficile, not specified as recurrent: Secondary | ICD-10-CM | POA: Diagnosis not present

## 2016-04-03 DIAGNOSIS — G9340 Encephalopathy, unspecified: Secondary | ICD-10-CM | POA: Diagnosis not present

## 2016-04-03 DIAGNOSIS — I2510 Atherosclerotic heart disease of native coronary artery without angina pectoris: Secondary | ICD-10-CM | POA: Diagnosis not present

## 2016-04-03 DIAGNOSIS — R1032 Left lower quadrant pain: Secondary | ICD-10-CM | POA: Diagnosis not present

## 2016-04-03 DIAGNOSIS — I2699 Other pulmonary embolism without acute cor pulmonale: Secondary | ICD-10-CM | POA: Diagnosis not present

## 2016-04-03 DIAGNOSIS — E119 Type 2 diabetes mellitus without complications: Secondary | ICD-10-CM | POA: Diagnosis not present

## 2016-04-03 DIAGNOSIS — G8929 Other chronic pain: Secondary | ICD-10-CM | POA: Diagnosis not present

## 2016-04-03 DIAGNOSIS — Z7901 Long term (current) use of anticoagulants: Secondary | ICD-10-CM | POA: Diagnosis not present

## 2016-04-03 DIAGNOSIS — F329 Major depressive disorder, single episode, unspecified: Secondary | ICD-10-CM | POA: Diagnosis not present

## 2016-04-03 DIAGNOSIS — R0989 Other specified symptoms and signs involving the circulatory and respiratory systems: Secondary | ICD-10-CM | POA: Diagnosis not present

## 2016-04-03 DIAGNOSIS — M6281 Muscle weakness (generalized): Secondary | ICD-10-CM | POA: Diagnosis not present

## 2016-04-03 DIAGNOSIS — D519 Vitamin B12 deficiency anemia, unspecified: Secondary | ICD-10-CM | POA: Diagnosis not present

## 2016-04-03 DIAGNOSIS — R531 Weakness: Secondary | ICD-10-CM | POA: Diagnosis not present

## 2016-04-03 DIAGNOSIS — M545 Low back pain: Secondary | ICD-10-CM | POA: Diagnosis not present

## 2016-04-03 DIAGNOSIS — R279 Unspecified lack of coordination: Secondary | ICD-10-CM | POA: Diagnosis not present

## 2016-04-03 DIAGNOSIS — M7989 Other specified soft tissue disorders: Secondary | ICD-10-CM | POA: Diagnosis not present

## 2016-04-03 DIAGNOSIS — E785 Hyperlipidemia, unspecified: Secondary | ICD-10-CM | POA: Diagnosis not present

## 2016-04-03 DIAGNOSIS — R972 Elevated prostate specific antigen [PSA]: Secondary | ICD-10-CM | POA: Diagnosis not present

## 2016-04-03 DIAGNOSIS — Z79899 Other long term (current) drug therapy: Secondary | ICD-10-CM | POA: Diagnosis not present

## 2016-04-03 DIAGNOSIS — G4700 Insomnia, unspecified: Secondary | ICD-10-CM | POA: Diagnosis not present

## 2016-04-03 DIAGNOSIS — R918 Other nonspecific abnormal finding of lung field: Secondary | ICD-10-CM | POA: Diagnosis not present

## 2016-04-03 DIAGNOSIS — R410 Disorientation, unspecified: Secondary | ICD-10-CM | POA: Diagnosis not present

## 2016-04-03 DIAGNOSIS — B0222 Postherpetic trigeminal neuralgia: Secondary | ICD-10-CM | POA: Diagnosis not present

## 2016-04-03 DIAGNOSIS — R0602 Shortness of breath: Secondary | ICD-10-CM | POA: Diagnosis not present

## 2016-04-03 DIAGNOSIS — Z7401 Bed confinement status: Secondary | ICD-10-CM | POA: Diagnosis not present

## 2016-04-03 DIAGNOSIS — L89002 Pressure ulcer of unspecified elbow, stage 2: Secondary | ICD-10-CM | POA: Diagnosis not present

## 2016-04-03 DIAGNOSIS — Z7982 Long term (current) use of aspirin: Secondary | ICD-10-CM | POA: Diagnosis not present

## 2016-04-03 DIAGNOSIS — I452 Bifascicular block: Secondary | ICD-10-CM | POA: Diagnosis not present

## 2016-04-03 DIAGNOSIS — Z794 Long term (current) use of insulin: Secondary | ICD-10-CM | POA: Diagnosis not present

## 2016-04-03 DIAGNOSIS — R938 Abnormal findings on diagnostic imaging of other specified body structures: Secondary | ICD-10-CM | POA: Diagnosis not present

## 2016-04-03 DIAGNOSIS — F331 Major depressive disorder, recurrent, moderate: Secondary | ICD-10-CM | POA: Diagnosis not present

## 2016-04-03 DIAGNOSIS — F0390 Unspecified dementia without behavioral disturbance: Secondary | ICD-10-CM | POA: Diagnosis not present

## 2016-04-03 DIAGNOSIS — R197 Diarrhea, unspecified: Secondary | ICD-10-CM | POA: Diagnosis not present

## 2016-04-03 DIAGNOSIS — M62838 Other muscle spasm: Secondary | ICD-10-CM | POA: Diagnosis not present

## 2016-04-03 DIAGNOSIS — M5441 Lumbago with sciatica, right side: Secondary | ICD-10-CM | POA: Diagnosis not present

## 2016-04-03 DIAGNOSIS — F17220 Nicotine dependence, chewing tobacco, uncomplicated: Secondary | ICD-10-CM | POA: Diagnosis not present

## 2016-04-03 DIAGNOSIS — E876 Hypokalemia: Secondary | ICD-10-CM | POA: Diagnosis not present

## 2016-04-03 DIAGNOSIS — R41841 Cognitive communication deficit: Secondary | ICD-10-CM | POA: Diagnosis not present

## 2016-04-03 DIAGNOSIS — I10 Essential (primary) hypertension: Secondary | ICD-10-CM | POA: Diagnosis not present

## 2016-04-03 DIAGNOSIS — Z9181 History of falling: Secondary | ICD-10-CM | POA: Diagnosis not present

## 2016-04-03 DIAGNOSIS — R0789 Other chest pain: Secondary | ICD-10-CM | POA: Diagnosis present

## 2016-04-03 LAB — FOLATE RBC
FOLATE, HEMOLYSATE: 381.2 ng/mL
FOLATE, RBC: 1083 ng/mL (ref >498)
HEMATOCRIT: 35.2 % — AB (ref 37.5–51.0)

## 2016-04-03 LAB — GLUCOSE, CAPILLARY: Glucose-Capillary: 154 mg/dL — ABNORMAL HIGH (ref 65–99)

## 2016-04-03 MED ORDER — FUROSEMIDE 10 MG/ML IJ SOLN
40.0000 mg | Freq: Once | INTRAMUSCULAR | Status: AC
  Administered 2016-04-03: 40 mg via INTRAVENOUS
  Filled 2016-04-03: qty 4

## 2016-04-03 MED ORDER — CYANOCOBALAMIN 1000 MCG/ML IJ SOLN: INTRAMUSCULAR | 0 refills | Status: DC

## 2016-04-03 MED ORDER — CYANOCOBALAMIN 1000 MCG/ML IJ SOLN
1000.0000 ug | Freq: Once | INTRAMUSCULAR | Status: AC
  Administered 2016-04-03: 1000 ug via INTRAMUSCULAR
  Filled 2016-04-03: qty 1

## 2016-04-03 MED ORDER — OXYCODONE-ACETAMINOPHEN 10-325 MG PO TABS: 1.0000 | ORAL_TABLET | ORAL | 0 refills | Status: DC | PRN

## 2016-04-03 MED ORDER — OXYCODONE-ACETAMINOPHEN 10-325 MG PO TABS: ORAL_TABLET | ORAL | 0 refills | Status: DC

## 2016-04-03 MED ORDER — POLYETHYLENE GLYCOL 3350 17 G PO PACK: 17.0000 g | PACK | Freq: Every day | ORAL | 0 refills | Status: DC | PRN

## 2016-04-03 NOTE — Clinical Social Work Placement (Signed)
   CLINICAL SOCIAL WORK PLACEMENT  NOTE  Date:  04/03/2016  Patient Details  Name: Francisco Powell MRN: [id number] Date of Birth: 01/04/1941  Clinical Social Work is seeking post-discharge placement for this patient at the [place] level of care (*CSW will initial, date and re-position this form in  chart as items are completed):  Yes   Patient/family provided with [facility] Work Department's list of facilities offering this level of care within the geographic area requested by the patient (or if unable, by the patient's family).  Yes   Patient/family informed of their freedom to choose among providers that offer the needed level of care, that participate in Medicare, Medicaid or managed care program needed by the patient, have an available bed and are willing to accept the patient.  Yes   Patient/family informed of [facility]'s ownership interest in [facility] and [facility], as well as of the fact that they are under no obligation to receive care at these facilities.  PASRR submitted to EDS on 04/01/16     PASRR number received on 04/01/16     Existing PASRR number confirmed on       FL2 transmitted to all facilities in geographic area requested by pt/family on 04/01/16     FL2 transmitted to all facilities within larger geographic area on       Patient informed that his/her managed care company has contracts with or will negotiate with certain facilities, including the following:        Yes   Patient/family informed of bed offers received.  Patient chooses bed at [facility]     Physician recommends and patient chooses bed at      Patient to be transferred to [facility] on 04/03/16.  Patient to be transferred to facility by [facility] Staff     Patient family notified on 04/03/16 of transfer.  Name of family member notified:  Abigail Litfin, spouse     PHYSICIAN       Additional Comment: CSW signing  off.   _______________________________________________ Ihor Gully, LCSW 04/03/2016, 9:31 AM

## 2016-04-03 NOTE — Progress Notes (Signed)
Patient discharge to [facility]. Report called to Abigail Butts, nurse. Patient stable. Patient transported via staff.

## 2016-04-03 NOTE — Discharge Summary (Signed)
Francisco Powell [id number] DOB: [birth year] DOA: 03/30/2016  PCP: Sallee Lange, MD  Admit date: 03/30/2016  Discharge date: 04/03/2016  Admitted From: Home   Disposition:  SNF   Recommendations for Outpatient Follow-up:   Follow up with PCP in 1-2 weeks  PCP Please obtain BMP/CBC, 2 view CXR in 1week,  (see Discharge instructions)   PCP Please follow up on the following pending results: Pending RPR   Home Health: None   Equipment/Devices: None  Consultations: Neuro Discharge Condition: Fair   CODE STATUS: Full   Diet Recommendation: Heart Healthy Low Carb, Accu-Cheks every before meals at bedtime   Chief Complaint  Patient presents with  . Hip Pain  . Fall     Brief history of present illness from the day of admission and additional interim summary    Francisco Mclawhorn Johnsonis a 75 y.o.malewith medical history significant for hypertension, insulin-dependent diabetes mellitus, coronary artery disease, and chronic low back pain status post multiple spinal surgeries who presents to the emergency department with generalized weakness, confusion, and gait instability with recurrent falls. Patient is accompanied by his daughter, a former ICU nurse, who assists with the history. Patient underwent his fourth lumbar spine surgery in August of this year and struggled with significant deconditioning and generalized weakness following this. He had been working with physical therapy and making a steady improvement until recent decline with emotional lability and depression.   He was started on Cymbalta 2 weeks ago after a home visit by his PCP and his daughter reports that he seems to have had a very good response to this. Unfortunately, he has again developed generalized weakness over the past 2 days, worsening  progressively, and now associated with confusion. Has had difficulty getting out of bed or out of a chair for the past couple days and his gait has been very unsteady. He has fallen twice over the last 24 hours, once striking his head, but not losing consciousness. Further workup showed CT scan of the head suspicious for NPH and he was admitted to the hospital.   Hospital issues addressed    1. Encephalopathy with Gen weakness, Falls, possible UTI - CT scan head suggestive of possible NPH, TSH was stable although his B-12 was slightly low, no focal deficits except chronic mild right lower extremity weakness. Mentation has improved. MRI nonacute, seen by neurology who think enlarged ventricles are likely due to age-related dementia and volume loss and not NPH. Continue PT, will require placement to SNF. We will follow with PCP and neurology outpatient.  2. ? UTI. [name] negative treated with Cipro. Stop antibiotics now.  3. Dehydration, hydrated with IV fluids and monitor at SNF.  4. Essential hypertension - he is currently on combination of Norvasc, Coreg and lisinopril.   5. Hypokalemia. Replaced & stable.  6. Chronic low back pain. Multiple L-spine surgeries and chronic right lower leg weakness. Supportive care.  7. CAD with bifascicular block on EKG. Chronic. Status post drug-eluting stent to RCA and LAD in 2015. Continue Coreg,  statin for secondary prevention. He follows with Dr. Domenic Polite. No acute issues.  8. DM type II. On Lantus and sliding scale will continue [facility] staff to continue doing Accu-Cheks before meals at bedtime and adjust insulin as needed, Glucophage stopped due to his age.  9. B-12 deficiency. Placed on replacement, follow results, check Foley catheter levels outpatient and follow-up on pending RPR results.      Discharge diagnosis     Principal Problem:   Acute encephalopathy Active Problems:   Bifascicular block - RBBB, LAFB   Essential  hypertension   Coronary atherosclerosis of native coronary artery   Depression   Insulin dependent diabetes mellitus (HCC)   UTI (urinary tract infection)   Fall at home   Generalized weakness   Hypokalemia   Chronic low back pain   Pressure ulcer of ankle   Pressure ulcer, sacrum    Discharge instructions    Discharge Instructions    Discharge instructions    Complete by:  As directed    Follow with Primary MD Sallee Lange, MD in 7 days   Get CBC, CMP, 2 view Chest X ray checked  by Primary MD or SNF MD in 5-7 days ( we routinely change or add medications that can affect your baseline labs and fluid status, therefore we recommend that you get the mentioned basic workup next visit with your PCP, your PCP may decide not to get them or add new tests based on their clinical decision)   Activity: As tolerated with Full fall precautions use walker/cane & assistance as needed   Disposition SNF   Diet:   Heart Healthy Low Carb, with feeding assistance and aspiration precautions. Accuchecks 4 times/day, Once in AM empty stomach and then before each meal.     For Heart failure patients - Check your Weight same time everyday, if you gain over 2 pounds, or you develop in leg swelling, experience more shortness of breath or chest pain, call your Primary MD immediately. Follow Cardiac Low Salt Diet and 1.5 lit/day fluid restriction.   On your next visit with your primary care physician please Get Medicines reviewed and adjusted.   Please request your Prim.MD to go over all Hospital Tests and Procedure/Radiological results at the follow up, please get all Hospital records sent to your Prim MD by signing hospital release before you go home.   If you experience worsening of your admission symptoms, develop shortness of breath, life threatening emergency, suicidal or homicidal thoughts you must seek medical attention immediately by calling 911 or calling your MD immediately  if symptoms  less severe.  You Must read complete instructions/literature along with all the possible adverse reactions/side effects for all the Medicines you take and that have been prescribed to you. Take any new Medicines after you have completely understood and accpet all the possible adverse reactions/side effects.   Do not drive, operate heavy machinery, perform activities at heights, swimming or participation in water activities or provide baby sitting services if your were admitted for syncope or siezures until you have seen by Primary MD or a Neurologist and advised to do so again.  Do not drive when taking Pain medications.    Do not take more than prescribed Pain, Sleep and Anxiety Medications  Special Instructions: If you have smoked or chewed Tobacco  in the last 2 yrs please stop smoking, stop any regular Alcohol  and or any Recreational drug use.  Wear Seat belts while driving.   Please note  You were cared for by a hospitalist during your hospital stay. If you have any questions about your discharge medications or the care you received while you were in the hospital after you are discharged, you can call the unit and asked to speak with the hospitalist on call if the hospitalist that took care of you is not available. Once you are discharged, your primary care physician will handle any further medical issues. Please note that NO REFILLS for any discharge medications will be authorized once you are discharged, as it is imperative that you return to your primary care physician (or establish a relationship with a primary care physician if you do not have one) for your aftercare needs so that they can reassess your need for medications and monitor your lab values.   Increase activity slowly    Complete by:  As directed       Discharge Medications     Medication List    STOP taking these medications   metFORMIN 1000 MG tablet Commonly known as:  GLUCOPHAGE     TAKE these medications     amLODipine 10 MG tablet Commonly known as:  NORVASC Take 1 tablet (10 mg total) by mouth daily.   baclofen 20 MG tablet Commonly known as:  LIORESAL Take 20 mg by mouth 3 (three) times daily.   carvedilol 12.5 MG tablet Commonly known as:  COREG TAKE 1 TABLET BY MOUTH TWICE DAILY WITH MEALS.   cyanocobalamin 1000 MCG/ML injection Commonly known as:  (VITAMIN B-12) 1 ml IM daily x 7 days then every 2 weeks   DULoxetine 30 MG capsule Commonly known as:  CYMBALTA Take 1 capsule (30 mg total) by mouth daily.   insulin aspart 100 UNIT/ML FlexPen Commonly known as:  NOVOLOG FLEXPEN INJECT 25 UNITS INTO THE SKIN TWICE DAILY.   insulin glargine 100 UNIT/ML injection Commonly known as:  LANTUS To use 50 units nightly may titrate up to 75 units nightly   INVOKANA 300 MG Tabs tablet Generic drug:  canagliflozin TAKE (1) TABLET BY MOUTH ONCE DAILY.   lisinopril 40 MG tablet Commonly known as:  PRINIVIL,ZESTRIL Take 1 tablet (40 mg total) by mouth daily.   Melatonin 10 MG Tabs Take 1 tablet by mouth at bedtime.   nitroGLYCERIN 0.4 MG SL tablet Commonly known as:  NITROSTAT Place 1 tablet (0.4 mg total) under the tongue every 5 (five) minutes x 3 doses as needed for chest pain.   oxyCODONE-acetaminophen 10-325 MG tablet Commonly known as:  PERCOCET Take 1 tablet by mouth every 4 (four) hours as needed for pain. Per DR ROY   polyethylene glycol packet Commonly known as:  MIRALAX / GLYCOLAX Take 17 g by mouth daily as needed for mild constipation.   pravastatin 40 MG tablet Commonly known as:  PRAVACHOL TAKE 1 TABLET BY MOUTH AT BEDTIME FOR CHOLESTEROL.   VALERIAN ROOT PO Take 1 tablet by mouth at bedtime.        Contact information for follow-up providers    Sallee Lange, MD. Schedule an appointment as soon as possible for a visit in 1 week(s).   Specialty:  Family Medicine Contact information: [place] [address] [phone]         [facility], Trey Sailors, MD. Schedule an appointment as soon as possible for a visit in 1 week(s).   Specialty:  Neurology Contact information: [address] [place] [address] [phone]  Contact information for after-discharge care    [facility] .   Specialty:  Skilled Nursing Facility Contact information: [address] [place] [phone]                  Major procedures and Radiology Reports - PLEASE review detailed and final reports thoroughly  -        Dg Chest 2 View  Result Date: 03/30/2016 CLINICAL DATA:  Severe right lower quadrant abdominal pain. Generalized weakness. EXAM: CHEST  2 VIEW COMPARISON:  01/15/2016 FINDINGS: Pulmonary vascularity is slightly prominent but the patient is supine. Overall heart size is normal. No infiltrates or effusions. No acute bone abnormality. IMPRESSION: No acute disease in the chest. Electronically Signed   By: Lorriane Shire M.D.   On: 03/30/2016 12:42   Ct Head Wo Contrast  Result Date: 03/30/2016 CLINICAL DATA:  Pain following fall EXAM: CT HEAD WITHOUT CONTRAST TECHNIQUE: Contiguous axial images were obtained from the base of the skull through the vertex without intravenous contrast. COMPARISON:  None. FINDINGS: Brain: There is moderate diffuse atrophy with ventricles somewhat prominent with respect to the sulci. There is no intracranial mass, hemorrhage, extra-axial fluid collection, or midline shift. There is mild small vessel disease in the centra semiovale bilaterally. Elsewhere gray-white compartments appear normal. No acute infarct is evident on this study. Vascular: There is no demonstrable hyperdense vessel. There is calcification in the distal left vertebral artery. The vertebral arteries are quite tortuous. There is also calcification in the carotid siphon regions bilaterally. Skull: The bony calvarium appears intact. Sinuses/Orbits:  There is mild mucosal thickening in several ethmoid air cells bilaterally. There is mild mucosal thickening in the posterior left maxillary antrum and in the right posterior sphenoid sinus. Orbits appear overall symmetric bilaterally. A small focus of metal is noted along the anterior most aspect of the left globe. Other: Mastoid air cells are clear. IMPRESSION: Atrophy with questionable superimposed normal pressure hydrocephalus. Mild periventricular small vessel disease. No intracranial mass, hemorrhage, or extra-axial fluid collection. No acute infarct evident. There are foci of arterial vascular calcification. There are foci of paranasal sinus disease. There is a small focus of metal along the anterior most aspect of the left globe. Direct visualization of this area advised. Electronically Signed   By: Lowella Grip III M.D.   On: 03/30/2016 12:22   Mr Brain Wo Contrast  Result Date: 04/02/2016 CLINICAL DATA:  75 year old male with confusion, generalized weakness, gait instability, recurrent falls. Multiple prior lumbar spine surgeries. Question of normal pressure hydrocephalus raised on noncontrast head CT. Initial encounter. EXAM: MRI HEAD WITHOUT CONTRAST TECHNIQUE: Multiplanar, multiecho pulse sequences of the brain and surrounding structures were obtained without intravenous contrast. COMPARISON:  Noncontrast head CT 03/30/2016 FINDINGS: Study is intermittently degraded by motion artifact despite repeated imaging attempts. Brain: No restricted diffusion to suggest acute infarction. No midline shift, mass effect, evidence of mass lesion, extra-axial collection or acute intracranial hemorrhage. Cervicomedullary junction and pituitary are within normal limits. The lateral and third ventricles are prominent as seen on the recent CT, but the temporal horns appear fairly normal. No evidence of hyperdynamic flow at the cerebral aqueduct. The fourth ventricle is normal. No transependymal edema.  Minimal periventricular and mild for age other patchy nonspecific mostly anterior frontal lobe white matter T2 and FLAIR hyperintensity. No cortical encephalomalacia or chronic cerebral blood products identified. T2 heterogeneity in the deep gray matter nuclei appears mostly  due to perivascular spaces although there is evidence of a small chronic lacunar infarct in the right basal ganglia. Negative brainstem and cerebellum. Vascular: Major intracranial vascular flow voids are preserved with generalized intracranial artery tortuosity. The distal left vertebral artery is dominant and dolichoectatic. Skull and upper cervical spine: Not well visualized. Normal bone marrow signal at the skullbase and in the upper cervical spine. Sinuses/Orbits: Negative orbits soft tissues, postoperative changes to the left globe. Trace paranasal sinus mucosal thickening. Other: Visible internal auditory structures appear normal. Mastoids are clear. Negative scalp soft tissues. IMPRESSION: 1. In the appropriate clinical setting normal pressure hydrocephalus would be difficult to exclude. Prominence of the lateral and third ventricles as seen on CT, but otherwise lacking strong imaging evidence of NPH. 2. No acute intracranial abnormality. Generalized intracranial artery dolichoectasia. Mild for age chronic small vessel disease. 3. Motion degraded exam. Electronically Signed   By: Genevie Ann M.D.   On: 04/02/2016 09:08   Ct Abdomen Pelvis W Contrast  Result Date: 03/30/2016 CLINICAL DATA:  Worsening back and posterior pelvic pain with leukocytosis, confusion and recent fall. Previous back surgery. EXAM: CT ABDOMEN AND PELVIS WITH CONTRAST TECHNIQUE: Multidetector CT imaging of the abdomen and pelvis was performed using the standard protocol following bolus administration of intravenous contrast. CONTRAST:  184mL ISOVUE-300 IOPAMIDOL (ISOVUE-300) INJECTION 61% COMPARISON:  Abdominal pelvic CT 10/29/2009. Lumbar spine MRI 07/21/2012 and  CT 12/20/2015. FINDINGS: Lower chest: Mild atelectasis at both lung bases. The heart is enlarged. There is atherosclerosis of the aorta and coronary arteries. No significant pleural or pericardial effusion. Hepatobiliary: No suspicious hepatic findings. There are scattered calcified granulomas. Previous cholecystectomy without significant biliary dilatation. Pancreas: Unremarkable. No pancreatic ductal dilatation or surrounding inflammatory changes. Spleen: Normal in size without focal abnormality. Adrenals/Urinary Tract: Both adrenal glands appear normal. There is an enlarging nonobstructing 10 mm calculus in the lower pole of the left kidney (image 43). No other urinary tract calculi are seen. There is no evidence of hydronephrosis or perinephric soft tissue stranding. There are several left renal cysts, measuring up to 4.7 x 4.0 cm on image 36. No suspicious renal findings. The bladder appears unremarkable. Stomach/Bowel: No evidence of bowel wall thickening, distention or surrounding inflammatory change. The proximal colon is decompressed. The appendix is not seen. Vascular/Lymphatic: There are no enlarged abdominal or pelvic lymph nodes. Aortic and branch vessel atherosclerosis. No evidence of large vessel occlusion. Reproductive: Mild heterogeneous enlargement of the prostate gland. The seminal vesicles appear normal. Other: There is mildly prominent fat in both inguinal canals. There is no herniated bowel. No ascites. Musculoskeletal: No acute osseous findings are demonstrated. There are extensive postsurgical changes within the lumbar spine status post L2 through S1 fusion. Lumbar spine findings are dictated in greater detail on the separate examination of the lumbar spine. IMPRESSION: 1. No acute findings or explanation for the patient's symptoms. 2. Postsurgical and degenerative changes throughout the lumbar spine. Lumbar spine details are dictated separately. 3. Nonobstructing left renal calculus.  Left  renal cysts. 4.  Aortic Atherosclerosis (ICD10-170.0) Electronically Signed   By: Richardean Sale M.D.   On: 03/30/2016 16:03   Ct L-spine No Charge  Result Date: 03/30/2016 CLINICAL DATA:  Back pain. EXAM: CT LUMBAR SPINE WITHOUT CONTRAST TECHNIQUE: Multidetector CT imaging of the lumbar spine was performed without intravenous contrast administration. Multiplanar CT image reconstructions were also generated. COMPARISON:  Radiographs of March 05, 2016. FINDINGS: Segmentation: Partially lumbarized S1 vertebral body is noted. Alignment: Stable grade 1 anterolisthesis  of L5-S1 is noted secondary to posterior facet joint hypertrophy. Otherwise good alignment of vertebral bodies is noted. Vertebrae: No acute fracture is noted. Status post surgical posterior fusion extending from L2-S1 with bilateral intrapedicular screw placement. Interbody fusion is also noted at these levels. Status post laminectomy as well extending from L2-S1. Status post left lateral fusion of L3-4. Paraspinal and other soft tissues: No definite evidence of significant bony or soft tissue stenosis seen in the central spinal canal. Disc levels: See above description for extensive postsurgical changes. IMPRESSION: Extensive postsurgical and degenerative changes as described above. No acute abnormality seen in the lumbar spine. Electronically Signed   By: Marijo Conception, M.D.   On: 03/30/2016 15:56    Micro Results    Recent Results (from the past 240 hour(s))  Urine culture     Status: None   Collection Time: 03/30/16 12:40 PM  Result Value Ref Range Status   Specimen Description URINE, CLEAN CATCH  Final   Special Requests NONE  Final   Culture   Final    NO GROWTH 1 DAY Performed at [facility]    Report Status 04/01/2016 FINAL  Final    Today   Subjective    Francisco Powell today has no headache,no chest abdominal pain,no new weakness tingling or numbness, feels much better wants to go home today. Has  chronic low back pain.   Objective   Blood pressure (!) 155/64, pulse (!) 55, temperature 98.3 F (36.8 C), temperature source Oral, resp. rate 20, height 6\' 2"  (1.88 m), weight 113.2 kg (249 lb 9.6 oz), SpO2 94 %.   Intake/Output Summary (Last 24 hours) at 04/03/16 0836 Last data filed at 04/03/16 0608  Gross per 24 hour  Intake              180 ml  Output             1750 ml  Net            -1570 ml    Exam Awake Alert, Oriented x 3, No new F.N deficits, Normal affect [place].AT,PERRAL Supple Neck,No JVD, No cervical lymphadenopathy appriciated.  Symmetrical Chest wall movement, Good air movement bilaterally, CTAB RRR,No Gallops,Rubs or new Murmurs, No Parasternal Heave +ve B.Sounds, Abd Soft, Non tender, No organomegaly appriciated, No rebound -guarding or rigidity. No Cyanosis, Clubbing or edema, No new Rash or bruise   Data Review   CBC w Diff:  Lab Results  Component Value Date   WBC 9.2 04/01/2016   HGB 10.3 (L) 04/01/2016   HCT 35.2 (L) 04/01/2016   HCT 43.5 01/08/2016   PLT 333 04/01/2016   PLT 264 01/08/2016   LYMPHOPCT 19 03/31/2016   MONOPCT 14 03/31/2016   EOSPCT 1 03/31/2016   BASOPCT 0 03/31/2016    CMP:  Lab Results  Component Value Date   NA 139 04/01/2016   NA 148 (H) 01/08/2016   K 3.6 04/02/2016   CL 105 04/01/2016   CO2 27 04/01/2016   BUN 9 04/01/2016   BUN 11 01/08/2016   CREATININE 0.70 04/01/2016   CREATININE 1.06 12/20/2012   PROT 7.2 03/30/2016   PROT 7.0 01/08/2016   ALBUMIN 3.0 (L) 03/30/2016   ALBUMIN 4.5 01/08/2016   BILITOT 0.5 03/30/2016   BILITOT 0.4 01/08/2016   ALKPHOS 74 03/30/2016   AST 21 03/30/2016   ALT 12 (L) 03/30/2016  .   Total Time in preparing paper work, data evaluation and todays exam -  35 minutes  [name] M.D on 04/03/2016 at 8:36 AM  [facility]   Office  [phone]

## 2016-04-03 NOTE — Clinical Social Work Note (Signed)
CSW advised Keri at [facility] advising that patient was discharging and that clinicals had been sent via [employer].  CSW spoke with patient's wife and advised of patient discharge.  CSW signing off.       Lira Stephen, Clydene Pugh, LCSW

## 2016-04-03 NOTE — Care Management Note (Signed)
Case Management Note  Patient Details  Name: JUJUAN GALLIA MRN: [id number] Date of Birth: [birth year]    Expected Discharge Date:                  Expected Discharge Plan:  [place]  In-House Referral:  NA  Discharge planning Services  CM Consult  Post Acute Care Choice:  [place], Resumption of Svcs/PTA Provider Choice offered to:     DME Arranged:    DME Agency:     HH Arranged:    [place] Agency:     Status of Service:  Completed, signed off  If discussed at [employer], dates discussed:    Additional Comments: Patient discharging today to SNF. CSW making arrangements. No CM needs.  Romy Mcgue, Chauncey Reading, RN 04/03/2016, 9:42 AM

## 2016-04-03 NOTE — Telephone Encounter (Signed)
rx request from [employer]. Patient just d/c from hospital. Got #10 from hospital

## 2016-04-03 NOTE — Discharge Instructions (Signed)
Follow with Primary MD Sallee Lange, MD in 7 days   Get CBC, CMP, 2 view Chest X ray checked  by Primary MD or SNF MD in 5-7 days ( we routinely change or add medications that can affect your baseline labs and fluid status, therefore we recommend that you get the mentioned basic workup next visit with your PCP, your PCP may decide not to get them or add new tests based on their clinical decision)   Activity: As tolerated with Full fall precautions use walker/cane & assistance as needed   Disposition SNF   Diet:   Heart Healthy Low Carb, with feeding assistance and aspiration precautions. Accuchecks 4 times/day, Once in AM empty stomach and then before each meal.     For Heart failure patients - Check your Weight same time everyday, if you gain over 2 pounds, or you develop in leg swelling, experience more shortness of breath or chest pain, call your Primary MD immediately. Follow Cardiac Low Salt Diet and 1.5 lit/day fluid restriction.   On your next visit with your primary care physician please Get Medicines reviewed and adjusted.   Please request your Prim.MD to go over all Hospital Tests and Procedure/Radiological results at the follow up, please get all Hospital records sent to your Prim MD by signing hospital release before you go home.   If you experience worsening of your admission symptoms, develop shortness of breath, life threatening emergency, suicidal or homicidal thoughts you must seek medical attention immediately by calling 911 or calling your MD immediately  if symptoms less severe.  You Must read complete instructions/literature along with all the possible adverse reactions/side effects for all the Medicines you take and that have been prescribed to you. Take any new Medicines after you have completely understood and accpet all the possible adverse reactions/side effects.   Do not drive, operate heavy machinery, perform activities at heights, swimming or participation in  water activities or provide baby sitting services if your were admitted for syncope or siezures until you have seen by Primary MD or a Neurologist and advised to do so again.  Do not drive when taking Pain medications.    Do not take more than prescribed Pain, Sleep and Anxiety Medications  Special Instructions: If you have smoked or chewed Tobacco  in the last 2 yrs please stop smoking, stop any regular Alcohol  and or any Recreational drug use.  Wear Seat belts while driving.   Please note  You were cared for by a hospitalist during your hospital stay. If you have any questions about your discharge medications or the care you received while you were in the hospital after you are discharged, you can call the unit and asked to speak with the hospitalist on call if the hospitalist that took care of you is not available. Once you are discharged, your primary care physician will handle any further medical issues. Please note that NO REFILLS for any discharge medications will be authorized once you are discharged, as it is imperative that you return to your primary care physician (or establish a relationship with a primary care physician if you do not have one) for your aftercare needs so that they can reassess your need for medications and monitor your lab values.

## 2016-04-06 ENCOUNTER — Non-Acute Institutional Stay (SKILLED_NURSING_FACILITY): Payer: Medicare Other | Admitting: Internal Medicine

## 2016-04-06 ENCOUNTER — Encounter: Payer: Self-pay | Admitting: Internal Medicine

## 2016-04-06 DIAGNOSIS — R531 Weakness: Secondary | ICD-10-CM

## 2016-04-06 DIAGNOSIS — E119 Type 2 diabetes mellitus without complications: Secondary | ICD-10-CM

## 2016-04-06 DIAGNOSIS — L89002 Pressure ulcer of unspecified elbow, stage 2: Secondary | ICD-10-CM

## 2016-04-06 DIAGNOSIS — I10 Essential (primary) hypertension: Secondary | ICD-10-CM | POA: Diagnosis not present

## 2016-04-06 DIAGNOSIS — I2510 Atherosclerotic heart disease of native coronary artery without angina pectoris: Secondary | ICD-10-CM

## 2016-04-06 DIAGNOSIS — IMO0001 Reserved for inherently not codable concepts without codable children: Secondary | ICD-10-CM

## 2016-04-06 DIAGNOSIS — Z794 Long term (current) use of insulin: Secondary | ICD-10-CM | POA: Diagnosis not present

## 2016-04-06 DIAGNOSIS — F331 Major depressive disorder, recurrent, moderate: Secondary | ICD-10-CM | POA: Diagnosis not present

## 2016-04-06 NOTE — Progress Notes (Addendum)
Provider:  Veleta Miners Location:   [facility] Room Number: 148/D Place of Service:  SNF (31)  PCP: Sallee Lange, MD Patient Care Team: [name], MD as PCP - General (Family Medicine) [name], MD as Consulting Physician (Gastroenterology) [name], MD as Consulting Physician (Cardiology)  Extended Emergency Contact Information Primary Emergency Contact: Helene Shoe Address: [address]          [place], [place] [address] Johnnette Litter of [place] Phone: [phone] Mobile Phone: [phone] Relation: Spouse Secondary Emergency Contact: Kizzie Furnish [place] of [place] Phone: [phone] Mobile Phone: [phone] Relation: Daughter  Code Status: DNR Goals of Care: Advanced Directive information Advanced Directives 04/06/2016  Does patient have an advance directive? Yes  Type of Advance Directive Out of facility DNR (pink MOST or yellow form)  Does patient want to make changes to advanced directive? No - Patient declined  Copy of advanced directive(s) in chart? Yes  Would patient like information on creating an advanced directive? -  Pre-existing out of facility DNR order (yellow form or pink MOST form) -      Chief Complaint  Patient presents with  . New Admit To SNF    HPI: Patient is a 75 y.o. male seen today for admission to SNF for Rehab. It seems patient has had Multiple Back surgeries for chronic Back pain. Since his last surgery in August patient had significant decondition and weakness. He was unable to walk without assistance and continued to have weakness specially in right leg. During this time he was seen by his PCP who started him on Cymbalta. It improved his mood but he continued with weakness and Confusion.He also had multiple falls at home. He was admitted with questionable UTI though his cultures were eventually negative. He was hydrated. His MRI showed ? NPH but Neurologist think that it is progressive  due to Age related atrophy. His Vit B12 was low and he was started on supplement. Patient is discharged For therapy. He lives with his wife and daughter who is nurse usually helps them. He was walking with walker at home till this Admission. They want him to get independent so he can go home. Patient continues to C/o Pain in his back and right leg.  Past Medical History:  Diagnosis Date  . Anemia, iron deficiency   . Anxiety   . Asbestosis(501)   . Coronary atherosclerosis of native coronary artery    DES RCA and staged DES LAD May 2015, LVEF 55%  . Essential hypertension, benign   . Gout   . Hx of adenomatous colonic polyps   . Hyperlipidemia   . Kidney carcinoma ([place]) 1998  . Lumbar back pain    Spondylosis and spondylolisthesis   . Myocardial infarction 2004  . Nephrolithiasis   . NSTEMI (non-ST elevated myocardial infarction) [facility])    May 2015  . Obesity   . Sciatica of right side   . Type II diabetes mellitus ([place])    Past Surgical History:  Procedure Laterality Date  . APPENDECTOMY    . BACK SURGERY  X 4  . CHOLECYSTECTOMY     Biliary dyskinesia  . COLONOSCOPY  2009   Dr. Arnoldo Morale: normal  . COLONOSCOPY  June 2011   Dr. Gala Romney: normal rectum, left-sided diverticula  . COLONOSCOPY N/A 04/12/2015   Procedure: COLONOSCOPY;  Surgeon: [name], MD;  Location: [facility];  Service: Endoscopy;  Laterality: N/A;  1115   . CYSTOSCOPY W/  LITHOLAPAXY / EHL  2009  . ESOPHAGOGASTRODUODENOSCOPY  June 2011   Dr. Gala Romney: erosive reflux esophagitis, small hiatal hernia, +H.pylori gastritis  . EYE SURGERY    . HOLMIUM LASER APPLICATION Right [id number]  . LEFT HEART CATHETERIZATION WITH CORONARY ANGIOGRAM N/A 11/09/2013   Procedure: LEFT HEART CATHETERIZATION WITH CORONARY ANGIOGRAM;  Surgeon: [name], MD;  Location: [facility] CATH LAB;  Service: Cardiovascular;  Laterality: N/A;  . LUMBAR DISC SURGERY  X 3  . PERCUTANEOUS CORONARY STENT INTERVENTION (PCI-S) N/A 11/10/2013    Procedure: PERCUTANEOUS CORONARY STENT INTERVENTION (PCI-S);  Surgeon: [name], MD;  Location: [facility] C[facility] LAB;  Service: Cardiovascular;  Laterality: N/A;  . POSTERIOR LUMBAR FUSION  2014  . RETINAL DETACHMENT SURGERY Right 1998   Secondary to injury detached retina/cataracts  . TONSILLECTOMY    . TUMOR EXCISION  1995    reports that he has never smoked. His smokeless tobacco use includes Chew. He reports that he does not drink alcohol or use drugs. Social History   Social History  . Marital status: Married    Spouse name: N/A  . Number of children: N/A  . Years of education: N/A   Occupational History  . Not on file.   Social History Main Topics  . Smoking status: Never Smoker  . Smokeless tobacco: Current User    Types: Chew  . Alcohol use No  . Drug use: No  . Sexual activity: Not on file   Other Topics Concern  . Not on file   Social History Narrative  . No narrative on file    Functional Status Survey:    Family History  Problem Relation Age of Onset  . Colon cancer Mother     deceased   . Cancer Maternal Grandmother     Gastric cancer  . Breast cancer Sister   . Heart attack Father   . Colon cancer Maternal Uncle   . Colon cancer Maternal Uncle   . Colon cancer Other     maternal great uncle  . Colon polyps Sister     Health Maintenance  Topic Date Due  . Samul Dada  05/15/1960  . INFLUENZA VACCINE  05/15/2016 (Originally 01/14/2016)  . HEMOGLOBIN A1C  07/10/2016  . URINE MICROALBUMIN  08/12/2016  . FOOT EXAM  08/14/2016  . OPHTHALMOLOGY EXAM  08/29/2016  . COLONOSCOPY  04/11/2020  . ZOSTAVAX  Completed  . PNA vac Low Risk Adult  Completed    Allergies  Allergen Reactions  . Penicillins Hives, Itching and Swelling  . Other     Bee stings  . Atorvastatin Other (See Comments)    Muscle pain/cramps   . Celexa [Citalopram] Diarrhea    Current Outpatient Prescriptions on File Prior to Visit  Medication Sig Dispense Refill  .  amLODipine (NORVASC) 10 MG tablet Take 1 tablet (10 mg total) by mouth daily. 90 tablet 2  . baclofen (LIORESAL) 20 MG tablet Take 20 mg by mouth 3 (three) times daily.    . carvedilol (COREG) 12.5 MG tablet TAKE 1 TABLET BY MOUTH TWICE DAILY WITH MEALS. 60 tablet 6  . cyanocobalamin (,VITAMIN B-12,) 1000 MCG/ML injection 1 ml IM daily x 7 days then every 2 weeks 1 mL 0  . DULoxetine (CYMBALTA) 30 MG capsule Take 1 capsule (30 mg total) by mouth daily. 30 capsule 5  . insulin aspart (NOVOLOG FLEXPEN) 100 UNIT/ML FlexPen INJECT 25 UNITS INTO THE SKIN TWICE DAILY. 15 mL 5  . insulin glargine (LANTUS)  100 UNIT/ML injection To use 50 units nightly may titrate up to 75 units nightly 60 mL 6  . INVOKANA 300 MG TABS tablet TAKE (1) TABLET BY MOUTH ONCE DAILY. 30 tablet 5  . lisinopril (PRINIVIL,ZESTRIL) 40 MG tablet Take 1 tablet (40 mg total) by mouth daily. 90 tablet 3  . Melatonin 10 MG TABS Take 1 tablet by mouth at bedtime.    . nitroGLYCERIN (NITROSTAT) 0.4 MG SL tablet Place 1 tablet (0.4 mg total) under the tongue every 5 (five) minutes x 3 doses as needed for chest pain. 25 tablet 4  . oxyCODONE-acetaminophen (PERCOCET) 10-325 MG tablet Take one tablet by mouth every 4 hours as needed for pain 180 tablet 0  . polyethylene glycol (MIRALAX / GLYCOLAX) packet Take 17 g by mouth daily as needed for mild constipation. 14 each 0  . pravastatin (PRAVACHOL) 40 MG tablet TAKE 1 TABLET BY MOUTH AT BEDTIME FOR CHOLESTEROL. 90 tablet 3  . VALERIAN ROOT PO Take 1 tablet by mouth at bedtime.     No current facility-administered medications on file prior to visit.      Review of Systems  Constitutional: Positive for activity change, appetite change and fatigue. Negative for chills, diaphoresis and fever.  HENT: Negative for congestion and rhinorrhea.   Respiratory: Negative for apnea, cough, choking, chest tightness, shortness of breath, wheezing and stridor.   Cardiovascular: Negative for chest pain,  palpitations and leg swelling.  Gastrointestinal: Negative for abdominal distention, abdominal pain, blood in stool, constipation, diarrhea and nausea.  Neurological: Positive for weakness. Negative for dizziness, tremors, seizures, syncope, light-headedness and numbness.  Psychiatric/Behavioral: Positive for confusion and dysphoric mood.    Vitals:   04/06/16 1322  BP: (!) 148/88  Pulse: 76  Resp: 20  Temp: 98.3 F (36.8 C)  TempSrc: Oral   There is no height or weight on file to calculate BMI. Physical Exam  Constitutional: He appears well-developed and well-nourished.  HENT:  Mouth/Throat: Oropharynx is clear and moist.  Cardiovascular: Normal rate, regular rhythm and normal heart sounds.   Pulmonary/Chest: Effort normal and breath sounds normal. No respiratory distress. He has no wheezes. He has no rales. He exhibits no tenderness.  Abdominal: Soft. Bowel sounds are normal. He exhibits no distension and no mass. There is no tenderness. There is no rebound and no guarding.  Musculoskeletal: He exhibits no edema.  Neurological: He is alert.  Was oriented to time got confused about the year. Knew about place and person. Had Good strength in UE. Had 3/5 in Left leg and 2/5 in Right LE    Labs reviewed: Basic Metabolic Panel:  Recent Labs  01/08/16 0833 03/30/16 1104 04/01/16 0632 04/02/16 0643  NA 148* 143 139  --   K 3.9 3.3* 3.4* 3.6  CL 106 102 105  --   CO2 22 30 27   --   GLUCOSE 90 124* 137*  --   BUN 11 15 9   --   CREATININE 0.90 0.70 0.70  --   CALCIUM 9.3 8.6* 8.3*  --   MG  --  1.8  --  1.8   Liver Function Tests:  Recent Labs  08/13/15 0818 01/08/16 0833 03/30/16 1104  AST 28 37 21  ALT 24 22 12*  ALKPHOS 60 58 74  BILITOT 0.3 0.4 0.5  PROT 6.8 7.0 7.2  ALBUMIN 4.3 4.5 3.0*   No results for input(s): LIPASE, AMYLASE in the last 8760 hours. No results for input(s): AMMONIA in the  last 8760 hours. CBC:  Recent Labs  01/08/16 0833   03/30/16 1104 03/31/16 0614 04/01/16 0632 04/02/16 1353  WBC 10.5  --  14.6* 10.2 9.2  --   NEUTROABS 6.8  --  10.8* 6.8  --   --   HGB  --   --  11.3* 10.4* 10.3*  --   HCT 43.5  < > 37.8* 34.9* 35.2* 35.2*  MCV 81  --  83.1 82.5 82.4  --   PLT 264  --  362 348 333  --   < > = values in this interval not displayed. Cardiac Enzymes:  Recent Labs  03/30/16 1104  TROPONINI 0.05*   BNP: Invalid input(s): POCBNP Lab Results  Component Value Date   HGBA1C 6.6 (H) 01/08/2016   Lab Results  Component Value Date   TSH 3.190 11/09/2013   Lab Results  Component Value Date   VITAMINB12 142 (L) 04/02/2016   No results found for: FOLATE No results found for: IRON, TIBC, FERRITIN  Imaging and Procedures obtained prior to SNF admission: No results found.  Assessment/Plan  Generalized weakness Can be due to multiple factors. Patient wife was concerned about the high dose of baclofen Which patient has been on recently. But patient is still having lot of pain due to Surgery. Will continue same dose. Will try to eventually taper him of it. Also on high dose of oxycodone.   Essential hypertension BP slightly elevated. Will monitor. Patient is on Amlodipine and carvedilol  Insulin dependent diabetes mellitus Type 2 Was taken of Metformin. Continue Invokana and Insulin. Monitor Accu check. His BS are running above 200 in morning. Will restart his metformin.  CAD S/P Stent placement. Will restart Aspirin. Already took Plavix for 1 year. Patient on Statin  Chronic Low back pain. On Oxycodone and baclofen. If pain continues will try Neurontin.  Decubitus ulcer of elbow, stage 2,  Continue dry dressing.  Depression Continue Cymbalta  Vitamin B12  Deficency found in the hospital on replacement therapy. Will need repeat levels in 4 weeks.   Family/ staff Communication:  D/W Wife   Labs/tests ordered: CBC and CMP in one week.

## 2016-04-13 ENCOUNTER — Encounter (HOSPITAL_COMMUNITY)
Admission: RE | Admit: 2016-04-13 | Discharge: 2016-04-13 | Disposition: A | Discharge status: Home / Self Care | Payer: Medicare Other | Source: Skilled Nursing Facility | Attending: Internal Medicine | Admitting: Internal Medicine

## 2016-04-13 DIAGNOSIS — I10 Essential (primary) hypertension: Secondary | ICD-10-CM | POA: Insufficient documentation

## 2016-04-13 DIAGNOSIS — E785 Hyperlipidemia, unspecified: Secondary | ICD-10-CM | POA: Insufficient documentation

## 2016-04-13 DIAGNOSIS — B0222 Postherpetic trigeminal neuralgia: Secondary | ICD-10-CM | POA: Insufficient documentation

## 2016-04-13 DIAGNOSIS — E119 Type 2 diabetes mellitus without complications: Secondary | ICD-10-CM | POA: Insufficient documentation

## 2016-04-13 LAB — CBC
HEMATOCRIT: 38 % — AB (ref 39.0–52.0)
Hemoglobin: 11.5 g/dL — ABNORMAL LOW (ref 13.0–17.0)
MCH: 24.8 pg — ABNORMAL LOW (ref 26.0–34.0)
MCHC: 30.3 g/dL (ref 30.0–36.0)
MCV: 81.9 fL (ref 78.0–100.0)
Platelets: 369 10*3/uL (ref 150–400)
RBC: 4.64 MIL/uL (ref 4.22–5.81)
RDW: 17.1 % — AB (ref 11.5–15.5)
WBC: 9.8 10*3/uL (ref 4.0–10.5)

## 2016-04-13 LAB — COMPREHENSIVE METABOLIC PANEL
ALBUMIN: 2.8 g/dL — AB (ref 3.5–5.0)
ALT: 17 U/L (ref 17–63)
ANION GAP: 8 (ref 5–15)
AST: 20 U/L (ref 15–41)
Alkaline Phosphatase: 83 U/L (ref 38–126)
BILIRUBIN TOTAL: 0.5 mg/dL (ref 0.3–1.2)
BUN: 18 mg/dL (ref 6–20)
CHLORIDE: 107 mmol/L (ref 101–111)
CO2: 27 mmol/L (ref 22–32)
Calcium: 8.8 mg/dL — ABNORMAL LOW (ref 8.9–10.3)
Creatinine, Ser: 0.79 mg/dL (ref 0.61–1.24)
GFR calc Af Amer: >60 mL/min (ref >60)
GFR calc non Af Amer: >60 mL/min (ref >60)
GLUCOSE: 187 mg/dL — AB (ref 65–99)
POTASSIUM: 3.6 mmol/L (ref 3.5–5.1)
SODIUM: 142 mmol/L (ref 135–145)
Total Protein: 6.9 g/dL (ref 6.5–8.1)

## 2016-04-17 ENCOUNTER — Telehealth: Payer: Self-pay | Admitting: Family Medicine

## 2016-04-17 ENCOUNTER — Encounter: Payer: Self-pay | Admitting: Internal Medicine

## 2016-04-17 ENCOUNTER — Encounter (HOSPITAL_COMMUNITY): Payer: Self-pay | Admitting: Emergency Medicine

## 2016-04-17 ENCOUNTER — Encounter (HOSPITAL_COMMUNITY)
Admission: RE | Admit: 2016-04-17 | Discharge: 2016-04-17 | Disposition: A | Discharge status: Home / Self Care | Payer: Medicare Other | Source: Skilled Nursing Facility | Attending: Internal Medicine | Admitting: Internal Medicine

## 2016-04-17 ENCOUNTER — Non-Acute Institutional Stay (SKILLED_NURSING_FACILITY): Payer: Medicare Other | Admitting: Internal Medicine

## 2016-04-17 ENCOUNTER — Emergency Department (HOSPITAL_COMMUNITY): Payer: Medicare Other

## 2016-04-17 ENCOUNTER — Emergency Department (HOSPITAL_COMMUNITY)
Admission: EM | Admit: 2016-04-17 | Discharge: 2016-04-17 | Disposition: A | Discharge status: Home / Self Care | Payer: Medicare Other | Attending: Emergency Medicine | Admitting: Emergency Medicine

## 2016-04-17 DIAGNOSIS — R0602 Shortness of breath: Secondary | ICD-10-CM | POA: Insufficient documentation

## 2016-04-17 DIAGNOSIS — R197 Diarrhea, unspecified: Secondary | ICD-10-CM | POA: Insufficient documentation

## 2016-04-17 DIAGNOSIS — M7989 Other specified soft tissue disorders: Secondary | ICD-10-CM | POA: Diagnosis not present

## 2016-04-17 DIAGNOSIS — R410 Disorientation, unspecified: Secondary | ICD-10-CM | POA: Insufficient documentation

## 2016-04-17 DIAGNOSIS — I2510 Atherosclerotic heart disease of native coronary artery without angina pectoris: Secondary | ICD-10-CM | POA: Insufficient documentation

## 2016-04-17 DIAGNOSIS — A0472 Enterocolitis due to Clostridium difficile, not specified as recurrent: Secondary | ICD-10-CM | POA: Insufficient documentation

## 2016-04-17 DIAGNOSIS — R1032 Left lower quadrant pain: Secondary | ICD-10-CM | POA: Insufficient documentation

## 2016-04-17 DIAGNOSIS — F17220 Nicotine dependence, chewing tobacco, uncomplicated: Secondary | ICD-10-CM | POA: Diagnosis not present

## 2016-04-17 DIAGNOSIS — Z794 Long term (current) use of insulin: Secondary | ICD-10-CM | POA: Insufficient documentation

## 2016-04-17 DIAGNOSIS — Z7982 Long term (current) use of aspirin: Secondary | ICD-10-CM | POA: Insufficient documentation

## 2016-04-17 DIAGNOSIS — R0789 Other chest pain: Secondary | ICD-10-CM

## 2016-04-17 DIAGNOSIS — R938 Abnormal findings on diagnostic imaging of other specified body structures: Secondary | ICD-10-CM

## 2016-04-17 DIAGNOSIS — E119 Type 2 diabetes mellitus without complications: Secondary | ICD-10-CM | POA: Diagnosis not present

## 2016-04-17 DIAGNOSIS — I2699 Other pulmonary embolism without acute cor pulmonale: Secondary | ICD-10-CM | POA: Diagnosis not present

## 2016-04-17 DIAGNOSIS — R9389 Abnormal findings on diagnostic imaging of other specified body structures: Secondary | ICD-10-CM

## 2016-04-17 DIAGNOSIS — Z79899 Other long term (current) drug therapy: Secondary | ICD-10-CM | POA: Insufficient documentation

## 2016-04-17 LAB — URINE MICROSCOPIC-ADD ON: RBC / HPF: NONE SEEN RBC/hpf (ref 0–5)

## 2016-04-17 LAB — COMPREHENSIVE METABOLIC PANEL
ALT: 16 U/L — ABNORMAL LOW (ref 17–63)
ANION GAP: 9 (ref 5–15)
AST: 22 U/L (ref 15–41)
Albumin: 2.9 g/dL — ABNORMAL LOW (ref 3.5–5.0)
Alkaline Phosphatase: 93 U/L (ref 38–126)
BUN: 13 mg/dL (ref 6–20)
CHLORIDE: 105 mmol/L (ref 101–111)
CO2: 28 mmol/L (ref 22–32)
Calcium: 8.9 mg/dL (ref 8.9–10.3)
Creatinine, Ser: 0.72 mg/dL (ref 0.61–1.24)
Glucose, Bld: 170 mg/dL — ABNORMAL HIGH (ref 65–99)
Potassium: 3.4 mmol/L — ABNORMAL LOW (ref 3.5–5.1)
Sodium: 142 mmol/L (ref 135–145)
Total Bilirubin: 0.6 mg/dL (ref 0.3–1.2)
Total Protein: 7.3 g/dL (ref 6.5–8.1)

## 2016-04-17 LAB — CBC WITH DIFFERENTIAL/PLATELET
Basophils Absolute: 0 10*3/uL (ref 0.0–0.1)
Basophils Absolute: 0 10*3/uL (ref 0.0–0.1)
Basophils Relative: 0 %
Basophils Relative: 0 %
EOS ABS: 0.1 10*3/uL (ref 0.0–0.7)
Eosinophils Absolute: 0.1 10*3/uL (ref 0.0–0.7)
Eosinophils Relative: 1 %
Eosinophils Relative: 1 %
HCT: 37.9 % — ABNORMAL LOW (ref 39.0–52.0)
HCT: 38.2 % — ABNORMAL LOW (ref 39.0–52.0)
HEMOGLOBIN: 11.3 g/dL — AB (ref 13.0–17.0)
Hemoglobin: 11.2 g/dL — ABNORMAL LOW (ref 13.0–17.0)
LYMPHS ABS: 2.3 10*3/uL (ref 0.7–4.0)
LYMPHS PCT: 24 %
Lymphocytes Relative: 23 %
Lymphs Abs: 2.1 10*3/uL (ref 0.7–4.0)
MCH: 24.1 pg — ABNORMAL LOW (ref 26.0–34.0)
MCH: 24.4 pg — AB (ref 26.0–34.0)
MCHC: 29.6 g/dL — ABNORMAL LOW (ref 30.0–36.0)
MCHC: 29.6 g/dL — AB (ref 30.0–36.0)
MCV: 81.7 fL (ref 78.0–100.0)
MCV: 82.3 fL (ref 78.0–100.0)
MONOS PCT: 11 %
Monocytes Absolute: 1.1 10*3/uL — ABNORMAL HIGH (ref 0.1–1.0)
Monocytes Absolute: 1.1 10*3/uL — ABNORMAL HIGH (ref 0.1–1.0)
Monocytes Relative: 12 %
NEUTROS PCT: 64 %
Neutro Abs: 5.9 10*3/uL (ref 1.7–7.7)
Neutro Abs: 6.4 10*3/uL (ref 1.7–7.7)
Neutrophils Relative %: 64 %
Platelets: 348 10*3/uL (ref 150–400)
Platelets: 397 10*3/uL (ref 150–400)
RBC: 4.64 MIL/uL (ref 4.22–5.81)
RBC: 4.64 MIL/uL (ref 4.22–5.81)
RDW: 17.2 % — ABNORMAL HIGH (ref 11.5–15.5)
RDW: 17.2 % — ABNORMAL HIGH (ref 11.5–15.5)
WBC: 9.2 10*3/uL (ref 4.0–10.5)
WBC: 9.8 10*3/uL (ref 4.0–10.5)

## 2016-04-17 LAB — URINALYSIS, ROUTINE W REFLEX MICROSCOPIC
Bilirubin Urine: NEGATIVE
Glucose, UA: >1000 mg/dL — AB
HGB URINE DIPSTICK: NEGATIVE
KETONES UR: NEGATIVE mg/dL
LEUKOCYTES UA: NEGATIVE
Nitrite: NEGATIVE
PROTEIN: NEGATIVE mg/dL
Specific Gravity, Urine: 1.01 (ref 1.005–1.030)
pH: 5.5 (ref 5.0–8.0)

## 2016-04-17 LAB — BRAIN NATRIURETIC PEPTIDE: B NATRIURETIC PEPTIDE 5: 58 pg/mL (ref 0.0–100.0)

## 2016-04-17 LAB — BASIC METABOLIC PANEL
Anion gap: 8 (ref 5–15)
BUN: 14 mg/dL (ref 6–20)
CO2: 28 mmol/L (ref 22–32)
Calcium: 8.7 mg/dL — ABNORMAL LOW (ref 8.9–10.3)
Chloride: 107 mmol/L (ref 101–111)
Creatinine, Ser: 0.74 mg/dL (ref 0.61–1.24)
GFR calc Af Amer: >60 mL/min (ref >60)
GFR calc non Af Amer: >60 mL/min (ref >60)
Glucose, Bld: 174 mg/dL — ABNORMAL HIGH (ref 65–99)
Potassium: 3.2 mmol/L — ABNORMAL LOW (ref 3.5–5.1)
Sodium: 143 mmol/L (ref 135–145)

## 2016-04-17 LAB — TROPONIN I
TROPONIN I: 0.03 ng/mL — AB (ref <0.03)
Troponin I: 0.03 ng/mL (ref <0.03)

## 2016-04-17 MED ORDER — IOPAMIDOL (ISOVUE-370) INJECTION 76%
100.0000 mL | Freq: Once | INTRAVENOUS | Status: AC | PRN
  Administered 2016-04-17: 100 mL via INTRAVENOUS

## 2016-04-17 NOTE — ED Provider Notes (Signed)
[place] DEPT Provider Note   CSN: [id number] Arrival date & time: 04/17/16  1119  By signing my name below, I, Francisco Powell, attest that this documentation has been prepared under the direction and in the presence of Francisco Rice, MD . Electronically Signed: Higinio Powell, Scribe. 04/17/2016. 11:50 AM.  History   Chief Complaint Chief Complaint  Patient presents with  . Follow-up   The history is provided by the patient. No language interpreter was used.   HPI Comments: Francisco Powell is a 75 y.o. male with PMHx of asbestos exposure, DM and diverticulitis, who presents to the Emergency Department from [facility], for a follow-up evaluation. Pt reports he experienced brief episode of chest pain and shortness of breath this morning in which resolved spontaneously. He was recently admitted to the hospital and had a follow-up chest x-ray and labs performed this morning. Radiologist's reading of the chest x-ray indicated there was some concern for PE. He was sent to the emergency department to rule out PE. Patient does have abdominal pain of unknown duration. States he's had 5 episodes of diarrhea since yesterday. Does not know if there is any blood in stool.  Past Medical History:  Diagnosis Date  . Anemia, iron deficiency   . Anxiety   . Asbestosis(501)   . Coronary atherosclerosis of native coronary artery    DES RCA and staged DES LAD May 2015, LVEF 55%  . Essential hypertension, benign   . Gout   . Hx of adenomatous colonic polyps   . Hyperlipidemia   . Kidney carcinoma ([place]) 1998  . Lumbar back pain    Spondylosis and spondylolisthesis   . Myocardial infarction 2004  . Nephrolithiasis   . NSTEMI (non-ST elevated myocardial infarction) [facility])    May 2015  . Obesity   . Sciatica of right side   . Type II diabetes mellitus [name])     Patient Active Problem List   Diagnosis Date Noted  . UTI (urinary tract infection) 03/30/2016  . Fall at home 03/30/2016  . Generalized  weakness 03/30/2016  . Acute encephalopathy 03/30/2016  . Hypokalemia 03/30/2016  . Chronic low back pain 03/30/2016  . Pressure ulcer of ankle 03/30/2016  . Pressure ulcer, sacrum 03/30/2016  . Right leg weakness 03/18/2016  . Insulin dependent diabetes mellitus ([place]) 08/15/2015  . History of colonic polyps   . Hx of adenomatous colonic polyps 04/04/2015  . Elevated PSA 09/04/2014  . Adenomatous polyps 03/06/2014  . FH: colon cancer 03/06/2014  . Helicobacter pylori (H. pylori) infection 03/06/2014  . Postherpetic neuralgia 02/28/2014  . Depression 01/14/2014  . Coronary atherosclerosis of native coronary artery 11/11/2013  . Bifascicular block - RBBB, LAFB 11/10/2013  . Essential hypertension 11/10/2013  . Hyperlipidemia 10/31/2008    Past Surgical History:  Procedure Laterality Date  . APPENDECTOMY    . BACK SURGERY  X 4  . CHOLECYSTECTOMY     Biliary dyskinesia  . COLONOSCOPY  2009   Dr. Arnoldo Morale: normal  . COLONOSCOPY  June 2011   Dr. Gala Romney: normal rectum, left-sided diverticula  . COLONOSCOPY N/A 04/12/2015   Procedure: COLONOSCOPY;  Surgeon: [name], MD;  Location: [facility];  Service: Endoscopy;  Laterality: N/A;  1115   . CYSTOSCOPY W/ LITHOLAPAXY / EHL  2009  . ESOPHAGOGASTRODUODENOSCOPY  June 2011   Dr. Gala Romney: erosive reflux esophagitis, small hiatal hernia, +H.pylori gastritis  . EYE SURGERY    . HOLMIUM LASER APPLICATION Right [id number]  . LEFT HEART  CATHETERIZATION WITH CORONARY ANGIOGRAM N/A 11/09/2013   Procedure: LEFT HEART CATHETERIZATION WITH CORONARY ANGIOGRAM;  Surgeon: [name], MD;  Location: [facility] C[facility] LAB;  Service: Cardiovascular;  Laterality: N/A;  . LUMBAR DISC SURGERY  X 3  . PERCUTANEOUS CORONARY STENT INTERVENTION (PCI-S) N/A 11/10/2013   Procedure: PERCUTANEOUS CORONARY STENT INTERVENTION (PCI-S);  Surgeon: [name], MD;  Location: [facility] CATH LAB;  Service: Cardiovascular;  Laterality: N/A;  . POSTERIOR LUMBAR FUSION  2014  .  RETINAL DETACHMENT SURGERY Right 1998   Secondary to injury detached retina/cataracts  . TONSILLECTOMY    . TUMOR EXCISION  1995       Home Medications    Prior to Admission medications   Medication Sig Start Date End Date Taking? Authorizing Provider  amLODipine (NORVASC) 10 MG tablet Take 1 tablet (10 mg total) by mouth daily. 03/18/16  Yes [name], MD  carvedilol (COREG) 12.5 MG tablet TAKE 1 TABLET BY MOUTH TWICE DAILY WITH MEALS. 03/18/16  Yes [name], MD  aspirin (ASPIRIN 81) 81 MG chewable tablet Chew 81 mg by mouth daily.    Historical Provider, MD  baclofen (LIORESAL) 20 MG tablet Take 20 mg by mouth 3 (three) times daily.    Historical Provider, MD  cyanocobalamin (,VITAMIN B-12,) 1000 MCG/ML injection 1 ml IM daily x 7 days then every 2 weeks 04/03/16   [name], MD  DULoxetine (CYMBALTA) 30 MG capsule Take 1 capsule (30 mg total) by mouth daily. 03/18/16   [name], MD  GLUCERNA (GLUCERNA) LIQD Take 237 mLs by mouth 2 (two) times daily between meals.    Historical Provider, MD  insulin aspart (NOVOLOG FLEXPEN) 100 UNIT/ML FlexPen INJECT 25 UNITS INTO THE SKIN TWICE DAILY. 03/18/16   [name], MD  insulin glargine (LANTUS) 100 UNIT/ML injection Inject 50 Units into the skin at bedtime.    Historical Provider, MD  INVOKANA 300 MG TABS tablet TAKE (1) TABLET BY MOUTH ONCE DAILY. 02/20/16   [name], MD  lisinopril (PRINIVIL,ZESTRIL) 40 MG tablet Take 1 tablet (40 mg total) by mouth daily. 08/15/15   [name], MD  Melatonin 10 MG TABS Take 1 tablet by mouth at bedtime.    Historical Provider, MD  Menthol, Topical Analgesic, (BIOFREEZE) 4 % GEL Apply Biofreeze to spine for pain management    Historical Provider, MD  metFORMIN (GLUCOPHAGE) 500 MG tablet Take 500 mg by mouth every evening.    Historical Provider, MD  nitroGLYCERIN (NITROSTAT) 0.4 MG SL tablet Place 1 tablet (0.4 mg total) under the tongue every 5 (five) minutes x 3 doses as needed  for chest pain. 11/15/14   [name], MD  oxyCODONE-acetaminophen (PERCOCET) 10-325 MG tablet Take one tablet by mouth every 4 hours as needed for pain 04/03/16   Gildardo Cranker, DO  polyethylene glycol (MIRALAX / GLYCOLAX) packet Take 17 g by mouth daily as needed for mild constipation. 04/03/16   [name], MD  pravastatin (PRAVACHOL) 40 MG tablet TAKE 1 TABLET BY MOUTH AT BEDTIME FOR CHOLESTEROL. 08/15/15   [name], MD    Family History Family History  Problem Relation Age of Onset  . Colon cancer Mother     deceased   . Cancer Maternal Grandmother     Gastric cancer  . Breast cancer Sister   . Heart attack Father   . Colon cancer Maternal Uncle   . Colon cancer Maternal Uncle   .  Colon cancer Other     maternal great uncle  . Colon polyps Sister     Social History Social History  Substance Use Topics  . Smoking status: Never Smoker  . Smokeless tobacco: Current User    Types: Chew  . Alcohol use No   Allergies   Penicillins; Other; Atorvastatin; and Celexa [citalopram]   Review of Systems Review of Systems  Constitutional: Negative for chills and fever.  Respiratory: Positive for shortness of breath (resolved). Negative for cough and chest tightness.   Cardiovascular: Positive for chest pain (resolved) and leg swelling. Negative for palpitations.  Gastrointestinal: Positive for abdominal pain and diarrhea. Negative for nausea and vomiting.  Genitourinary: Negative for flank pain and frequency.  Musculoskeletal: Negative for back pain, myalgias, neck pain and neck stiffness.  Neurological: Negative for dizziness, weakness, light-headedness, numbness and headaches.  Psychiatric/Behavioral: Positive for confusion.  All other systems reviewed and are negative.  Physical Exam Updated Vital Signs BP 162/82 (BP Location: Right Arm)   Pulse 61   Temp 97.8 F (36.6 C) (Oral)   Resp 20   Ht 5\' 11"  (1.803 m)   Wt 249 lb (112.9 kg)   SpO2 94%   BMI  34.73 kg/m   Physical Exam  Constitutional: He is oriented to person, place, and time. He appears well-developed and well-nourished. No distress.  HENT:  Head: Normocephalic and atraumatic.  Mouth/Throat: Oropharynx is clear and moist.  Eyes: EOM are normal. Pupils are equal, round, and reactive to light.  Neck: Normal range of motion. Neck supple.  Cardiovascular: Normal rate and regular rhythm.   Pulmonary/Chest: Effort normal and breath sounds normal. No respiratory distress. He has no wheezes. He has no rales. He exhibits no tenderness.  Abdominal: Soft. Bowel sounds are normal. There is tenderness (patient with left lower quadrant tenderness to palpati). There is no rebound and no guarding.  Musculoskeletal: Normal range of motion. He exhibits edema. He exhibits no tenderness.  1+ bilateral pitting edema.  Neurological: He is alert and oriented to person, place, and time.  Moves all extremities without deficit. Sensation fully intact. Mild confusion  Skin: Skin is warm and dry. Capillary refill takes less than 2 seconds. No rash noted. No erythema.  Psychiatric: He has a normal mood and affect. His behavior is normal.  Nursing note and vitals reviewed.    ED Treatments / Results  Labs (all labs ordered are listed, but only abnormal results are displayed) Labs Reviewed  CBC WITH DIFFERENTIAL/PLATELET - Abnormal; Notable for the following:       Result Value   Hemoglobin 11.3 (*)    HCT 38.2 (*)    MCH 24.4 (*)    MCHC 29.6 (*)    RDW 17.2 (*)    Monocytes Absolute 1.1 (*)    All other components within normal limits  COMPREHENSIVE METABOLIC PANEL - Abnormal; Notable for the following:    Potassium 3.4 (*)    Glucose, Bld 170 (*)    Albumin 2.9 (*)    ALT 16 (*)    All other components within normal limits  TROPONIN I - Abnormal; Notable for the following:    Troponin I 0.03 (*)    All other components within normal limits  URINALYSIS, ROUTINE W REFLEX MICROSCOPIC (NOT  AT [facility]) - Abnormal; Notable for the following:    Glucose, UA >1000 (*)    All other components within normal limits  URINE MICROSCOPIC-ADD ON - Abnormal; Notable for the following:  Squamous Epithelial / LPF 0-5 (*)    Bacteria, UA RARE (*)    Casts HYALINE CASTS (*)    All other components within normal limits  TROPONIN I - Abnormal; Notable for the following:    Troponin I 0.03 (*)    All other components within normal limits  BRAIN NATRIURETIC PEPTIDE    EKG  EKG Interpretation None       Radiology Ct Angio Chest Pe W And/or Wo Contrast  Result Date: 04/17/2016 CLINICAL DATA:  Shortness of breath. Diarrhea beginning April 16, 2016. EXAM: CT ANGIOGRAPHY CHEST CT ABDOMEN AND PELVIS WITH CONTRAST TECHNIQUE: Multidetector CT imaging of the chest was performed using the standard protocol during bolus administration of intravenous contrast. Multiplanar CT image reconstructions and MIPs were obtained to evaluate the vascular anatomy. Multidetector CT imaging of the abdomen and pelvis was performed using the standard protocol during bolus administration of intravenous contrast. CONTRAST:  100 mL of Isovue 370 COMPARISON:  CT the abdomen and pelvis March 30, 2016 and CT of the chest August 28, 2015 FINDINGS: CTA CHEST FINDINGS Cardiovascular: 3 vessel coronary artery disease. The heart size is stable without gross cardiomegaly. No effusions. Atherosclerosis seen in the thoracic aorta. No aneurysm or dissection. No pulmonary emboli. Mediastinum/Nodes: No enlarged mediastinal, hilar, or axillary lymph nodes. Thyroid gland, trachea, and esophagus demonstrate no significant findings. Lungs/Pleura: The central airways are normal. No pneumothorax. No suspicious pulmonary nodules, masses, or focal infiltrates. No pleural plaques to suggest asbestos exposure. Musculoskeletal: Multilevel degenerative changes in the thoracic spine. Scoliotic curvature persists. No acute bony abnormalities. Review of  the MIP images confirms the above findings. CT ABDOMEN and PELVIS FINDINGS Hepatobiliary: The patient is status post cholecystectomy. The liver and portal vein are otherwise within normal limits. Pancreas: Unremarkable. No pancreatic ductal dilatation or surrounding inflammatory changes. Spleen: Normal in size without focal abnormality. Adrenals/Urinary Tract: The lower pole the right kidney is not well assessed due to streak artifact off of lumbar spine hardware. There are multiple small scattered low-attenuation lesions in both kidneys consistent with cysts but too small to completely characterize. A larger cyst is also seen on the right. The 10 mm stone in the lower left kidney or cysts. No hydronephrosis or acute perinephric stranding. No ureterectasis or ureteral stones. Stomach/Bowel: The stomach and small bowel are within normal limits. The colon is normal. The appendix has been surgically removed. Vascular/Lymphatic: Atherosclerotic change involves the aorta in addition to the iliac and femoral vessels with no aneurysm or dissection. No adenopathy. Reproductive: Prostate is unremarkable. Other: Fat containing inguinal hernias remain.  Postsurgical changes Musculoskeletal: Postsurgical changes remain in the lumbar spine. Chronic changes to the lumbar spine are stable. No acute bony abnormalities. Review of the MIP images confirms the above findings. IMPRESSION: 1. No pulmonary emboli or acute abnormality in the chest. 2. No cause for diarrhea identified. 3. Chronic changes in lumbar spine including surgical hardware and significant erosion to the superior half of L3 and the inferior endplate of L2. Electronically Signed   By: Dorise Bullion III M.D   On: 04/17/2016 13:49   Ct Abdomen Pelvis W Contrast  Result Date: 04/17/2016 CLINICAL DATA:  Shortness of breath. Diarrhea beginning April 16, 2016. EXAM: CT ANGIOGRAPHY CHEST CT ABDOMEN AND PELVIS WITH CONTRAST TECHNIQUE: Multidetector CT imaging of the  chest was performed using the standard protocol during bolus administration of intravenous contrast. Multiplanar CT image reconstructions and MIPs were obtained to evaluate the vascular anatomy. Multidetector CT imaging of  the abdomen and pelvis was performed using the standard protocol during bolus administration of intravenous contrast. CONTRAST:  100 mL of Isovue 370 COMPARISON:  CT the abdomen and pelvis March 30, 2016 and CT of the chest August 28, 2015 FINDINGS: CTA CHEST FINDINGS Cardiovascular: 3 vessel coronary artery disease. The heart size is stable without gross cardiomegaly. No effusions. Atherosclerosis seen in the thoracic aorta. No aneurysm or dissection. No pulmonary emboli. Mediastinum/Nodes: No enlarged mediastinal, hilar, or axillary lymph nodes. Thyroid gland, trachea, and esophagus demonstrate no significant findings. Lungs/Pleura: The central airways are normal. No pneumothorax. No suspicious pulmonary nodules, masses, or focal infiltrates. No pleural plaques to suggest asbestos exposure. Musculoskeletal: Multilevel degenerative changes in the thoracic spine. Scoliotic curvature persists. No acute bony abnormalities. Review of the MIP images confirms the above findings. CT ABDOMEN and PELVIS FINDINGS Hepatobiliary: The patient is status post cholecystectomy. The liver and portal vein are otherwise within normal limits. Pancreas: Unremarkable. No pancreatic ductal dilatation or surrounding inflammatory changes. Spleen: Normal in size without focal abnormality. Adrenals/Urinary Tract: The lower pole the right kidney is not well assessed due to streak artifact off of lumbar spine hardware. There are multiple small scattered low-attenuation lesions in both kidneys consistent with cysts but too small to completely characterize. A larger cyst is also seen on the right. The 10 mm stone in the lower left kidney or cysts. No hydronephrosis or acute perinephric stranding. No ureterectasis or ureteral  stones. Stomach/Bowel: The stomach and small bowel are within normal limits. The colon is normal. The appendix has been surgically removed. Vascular/Lymphatic: Atherosclerotic change involves the aorta in addition to the iliac and femoral vessels with no aneurysm or dissection. No adenopathy. Reproductive: Prostate is unremarkable. Other: Fat containing inguinal hernias remain.  Postsurgical changes Musculoskeletal: Postsurgical changes remain in the lumbar spine. Chronic changes to the lumbar spine are stable. No acute bony abnormalities. Review of the MIP images confirms the above findings. IMPRESSION: 1. No pulmonary emboli or acute abnormality in the chest. 2. No cause for diarrhea identified. 3. Chronic changes in lumbar spine including surgical hardware and significant erosion to the superior half of L3 and the inferior endplate of L2. Electronically Signed   By: Dorise Bullion III M.D   On: 04/17/2016 13:49    Procedures Procedures (including critical care time)  Medications Ordered in ED Medications  iopamidol (ISOVUE-370) 76 % injection 100 mL (100 mLs Intravenous Contrast Given 04/17/16 1304)    DIAGNOSTIC STUDIES:  Oxygen Saturation is 94% on RA, normal by my interpretation.    COORDINATION OF CARE:  11:46 AM Discussed treatment Powell with pt at bedside and pt agreed to Powell.  Initial Impression / Assessment and Powell / ED Course  I have reviewed the triage vital signs and the nursing notes.  Pertinent labs & imaging results that were available during my care of the patient were reviewed by me and considered in my medical decision making (see chart for details).  Clinical Course      I personally performed the services described in this documentation, which was scribed in my presence. The recorded information has been reviewed and is accurate.   Final Clinical Impressions(s) / ED Diagnoses   Final diagnoses:  Atypical chest pain   CT without any acute findings. Vital  signs remained stable. Patient is chest pain-free. Initial troponin was 0.03. Delta troponin was unchanged. We'll discharge home to follow-up with his primary physician. Return precautions given. New Prescriptions New Prescriptions   No medications  on file     Francisco Rice, MD 04/17/16 1535

## 2016-04-17 NOTE — ED Notes (Signed)
Critical lab:  Troponin of 0.03

## 2016-04-17 NOTE — Telephone Encounter (Signed)
Calling to let you know that Francisco Powell is currently in the [facility] and the appointment on 04/22/16 was cancelled.

## 2016-04-17 NOTE — ED Notes (Signed)
Critical lab: Troponin of 0.03 given to MD [place].

## 2016-04-17 NOTE — Progress Notes (Signed)
Location:   [facility] Room Number: 135/P Place of Service:  SNF (31) Provider:  Jolayne Haines, MD  Patient Care Team: [name], MD as PCP - General (Family Medicine) [name], MD as Consulting Physician (Gastroenterology) [name], MD as Consulting Physician (Cardiology)  Extended Emergency Contact Information Primary Emergency Contact: Morency,Rebecca Address: [address]          [place], [place] [address] Johnnette Litter of [place] Phone: [phone] Mobile Phone: [phone] Relation: Spouse Secondary Emergency Contact: Kizzie Furnish [place] of [place] Phone: [phone] Mobile Phone: [phone] Relation: Daughter  Code Status:  Full Code Goals of care: Advanced Directive information Advanced Directives 04/17/2016  Does patient have an advance directive? Yes  Type of Advance Directive (No Data)  Does patient want to make changes to advanced directive? No - Patient declined  Copy of advanced directive(s) in chart? Yes  Would patient like information on creating an advanced directive? -  Pre-existing out of facility DNR order (yellow form or pink MOST form) -     Chief Complaint  Patient presents with  . Acute Visit   Secondary to check x-ray showing possible pulmonary embolic disease HPI:  Pt is a 75 y.o. male seen today for an acute visit for a chest x-ray which shows possible pulmonary embolic disease.  Patient is a 75 year old male who was admitted recently the hospital with questionable UTI but cultures were negative-he was hydrated.  His MRI showed questionable NPH but neurology thought it was progressive due to age-related atrophy.  He does have a history of chronic back pain which is been significant and he was discharged here for therapy.    Apparently had a follow-up chest x-ray ordered for today.  He did have a chest x-ray on 03/30/2016. That showed pulmonary vascularity slightly  prominent but patient was supine-no infiltrates or effusions-no acute disease in the chest.  However the x-ray done today by [facility] service shows possible pulmonary embolic disease.  His O2 sats rations are in the 90s he is not complaining of any chest pain or shortness of breath appears this with an incidental finding.  He does have a history of asbestos exposure but I note on CT scan done in March 2017 it did not show suspicions of asbestosis    I   Past Medical History:  Diagnosis Date  . Anemia, iron deficiency   . Anxiety   . Asbestosis(501)   . Coronary atherosclerosis of native coronary artery    DES RCA and staged DES LAD May 2015, LVEF 55%  . Essential hypertension, benign   . Gout   . Hx of adenomatous colonic polyps   . Hyperlipidemia   . Kidney carcinoma ([facility]) 1998  . Lumbar back pain    Spondylosis and spondylolisthesis   . Myocardial infarction 2004  . Nephrolithiasis   . NSTEMI (non-ST elevated myocardial infarction) [facility])    May 2015  . Obesity   . Sciatica of right side   . Type II diabetes mellitus ([place])    Past Surgical History:  Procedure Laterality Date  . APPENDECTOMY    . BACK SURGERY  X 4  . CHOLECYSTECTOMY     Biliary dyskinesia  . COLONOSCOPY  2009   Dr. Arnoldo Morale: normal  . COLONOSCOPY  June 2011   Dr. Gala Romney: normal rectum, left-sided diverticula  . COLONOSCOPY N/A 04/12/2015   Procedure: COLONOSCOPY;  Surgeon: [name], MD;  Location: [facility]  ENDO SUITE;  Service: Endoscopy;  Laterality: N/A;  1115   . CYSTOSCOPY W/ LITHOLAPAXY / EHL  2009  . ESOPHAGOGASTRODUODENOSCOPY  June 2011   Dr. Gala Romney: erosive reflux esophagitis, small hiatal hernia, +H.pylori gastritis  . EYE SURGERY    . HOLMIUM LASER APPLICATION Right [id number]  . LEFT HEART CATHETERIZATION WITH CORONARY ANGIOGRAM N/A 11/09/2013   Procedure: LEFT HEART CATHETERIZATION WITH CORONARY ANGIOGRAM;  Surgeon: [name], MD;  Location: [facility] CATH LAB;  Service: Cardiovascular;   Laterality: N/A;  . LUMBAR DISC SURGERY  X 3  . PERCUTANEOUS CORONARY STENT INTERVENTION (PCI-S) N/A 11/10/2013   Procedure: PERCUTANEOUS CORONARY STENT INTERVENTION (PCI-S);  Surgeon: [name], MD;  Location: [facility] CATH LAB;  Service: Cardiovascular;  Laterality: N/A;  . POSTERIOR LUMBAR FUSION  2014  . RETINAL DETACHMENT SURGERY Right 1998   Secondary to injury detached retina/cataracts  . TONSILLECTOMY    . TUMOR EXCISION  1995    Allergies  Allergen Reactions  . Penicillins Hives, Itching and Swelling  . Other     Bee stings  . Atorvastatin Other (See Comments)    Muscle pain/cramps   . Celexa [Citalopram] Diarrhea    Current Outpatient Prescriptions on File Prior to Visit  Medication Sig Dispense Refill  . amLODipine (NORVASC) 10 MG tablet Take 1 tablet (10 mg total) by mouth daily. 90 tablet 2  . baclofen (LIORESAL) 20 MG tablet Take 20 mg by mouth 3 (three) times daily.    . carvedilol (COREG) 12.5 MG tablet TAKE 1 TABLET BY MOUTH TWICE DAILY WITH MEALS. 60 tablet 6  . cyanocobalamin (,VITAMIN B-12,) 1000 MCG/ML injection 1 ml IM daily x 7 days then every 2 weeks 1 mL 0  . DULoxetine (CYMBALTA) 30 MG capsule Take 1 capsule (30 mg total) by mouth daily. 30 capsule 5  . insulin aspart (NOVOLOG FLEXPEN) 100 UNIT/ML FlexPen INJECT 25 UNITS INTO THE SKIN TWICE DAILY. 15 mL 5  . INVOKANA 300 MG TABS tablet TAKE (1) TABLET BY MOUTH ONCE DAILY. 30 tablet 5  . lisinopril (PRINIVIL,ZESTRIL) 40 MG tablet Take 1 tablet (40 mg total) by mouth daily. 90 tablet 3  . Melatonin 10 MG TABS Take 1 tablet by mouth at bedtime.    . nitroGLYCERIN (NITROSTAT) 0.4 MG SL tablet Place 1 tablet (0.4 mg total) under the tongue every 5 (five) minutes x 3 doses as needed for chest pain. 25 tablet 4  . oxyCODONE-acetaminophen (PERCOCET) 10-325 MG tablet Take one tablet by mouth every 4 hours as needed for pain 180 tablet 0  . polyethylene glycol (MIRALAX / GLYCOLAX) packet Take 17 g by mouth daily as  needed for mild constipation. 14 each 0  . pravastatin (PRAVACHOL) 40 MG tablet TAKE 1 TABLET BY MOUTH AT BEDTIME FOR CHOLESTEROL. 90 tablet 3   No current facility-administered medications on file prior to visit.     Review of Systems Constitutional: Complains of weakness and generalized pain. Negative for chills, diaphoresis and fever.  HENT: Negative for congestion and rhinorrhea.   Respiratory: . Is not complaining of shortness of breath or cough   Cardiovascular: Negative for chest pain, palpitations and leg swelling.  Gastrointestinal: Negative for abdominal distention, abdominal pain, blood in stool, constipation, diarrhea and nausea.  Neurological: Positive for weakness. Negative for dizziness, tremors, seizures, syncope, light-headedness and numbness.  Psychiatric/Behavioral: Positive for confusion and dysphoric mood.   Immunization History  Administered Date(s) Administered  . Influenza Split 03/28/2013  . Influenza,inj,Quad PF,36+ Mos  03/22/2014  . Influenza-Unspecified 03/15/2012, 04/15/2015  . Pneumococcal Conjugate-13 03/22/2014  . Pneumococcal Polysaccharide-23 03/15/2008, 12/23/2012  . Zoster 10/14/2007   Pertinent  Health Maintenance Due  Topic Date Due  . INFLUENZA VACCINE  05/15/2016 (Originally 01/14/2016)  . HEMOGLOBIN A1C  07/10/2016  . URINE MICROALBUMIN  08/12/2016  . FOOT EXAM  08/14/2016  . OPHTHALMOLOGY EXAM  08/29/2016  . COLONOSCOPY  04/11/2020  . PNA vac Low Risk Adult  Completed   Fall Risk  08/15/2015 06/01/2014 06/01/2014 11/28/2013  Falls in the past year? No No No No   Functional Status Survey:    Vitals:   04/17/16 1040  BP: (!) 173/78  Pulse: (!) 55  Resp: 18  Temp: 97.3 F (36.3 C)  TempSrc: Oral  SpO2: 94%   There is no height or weight on file to calculate BMI. Physical Exam   In general this is a alert male in no distress resting comfortably in bed somewhat somnolent but becomes alert with stimulation verbal or  tactile.  His skin is warm and dry.  Chest has shallow air entry but there is no labored breathing no sign of distress.  Heart is regular rate and rhythm without murmur gallop or rub he does not really have significant lower extremity edema.  Abdomen somewhat obese soft nontender with positive bowel sounds.  Musculoskeletal is able to move all extremities 4 limited exam since he is in bed but appears to have some bilateral lower extremity weakness.  Neurologic appears grossly intact no lateralizing findings her speech is clear.  Psych he is oriented to self does follow verbal commands without difficulty appears to have some confusion at times--as he became more alert after sleeping he was more cognitively intact and conversational    Labs reviewed:  Recent Labs  03/30/16 1104 04/01/16 0632 04/02/16 0643 04/13/16 0700 04/17/16 0700  NA 143 139  --  142 143  K 3.3* 3.4* 3.6 3.6 3.2*  CL 102 105  --  107 107  CO2 30 27  --  27 28  GLUCOSE 124* 137*  --  187* 174*  BUN 15 9  --  18 14  CREATININE 0.70 0.70  --  0.79 0.74  CALCIUM 8.6* 8.3*  --  8.8* 8.7*  MG 1.8  --  1.8  --   --     Recent Labs  01/08/16 0833 03/30/16 1104 04/13/16 0700  AST 37 21 20  ALT 22 12* 17  ALKPHOS 58 74 83  BILITOT 0.4 0.5 0.5  PROT 7.0 7.2 6.9  ALBUMIN 4.5 3.0* 2.8*    Recent Labs  03/30/16 1104 03/31/16 0614 04/01/16 0632 04/02/16 1353 04/13/16 0700 04/17/16 0700  WBC 14.6* 10.2 9.2  --  9.8 9.2  NEUTROABS 10.8* 6.8  --   --   --  5.9  HGB 11.3* 10.4* 10.3*  --  11.5* 11.2*  HCT 37.8* 34.9* 35.2* 35.2* 38.0* 37.9*  MCV 83.1 82.5 82.4  --  81.9 81.7  PLT 362 348 333  --  369 348   Lab Results  Component Value Date   TSH 3.190 11/09/2013   Lab Results  Component Value Date   HGBA1C 6.6 (H) 01/08/2016   Lab Results  Component Value Date   CHOL 162 01/08/2016   HDL 36 (L) 01/08/2016   LDLCALC 65 01/08/2016   TRIG 305 (H) 01/08/2016   CHOLHDL 4.5 01/08/2016     Significant Diagnostic Results in last 30 days:  Dg Chest 2  View  Result Date: 03/30/2016 CLINICAL DATA:  Severe right lower quadrant abdominal pain. Generalized weakness. EXAM: CHEST  2 VIEW COMPARISON:  01/15/2016 FINDINGS: Pulmonary vascularity is slightly prominent but the patient is supine. Overall heart size is normal. No infiltrates or effusions. No acute bone abnormality. IMPRESSION: No acute disease in the chest. Electronically Signed   By: Lorriane Shire M.D.   On: 03/30/2016 12:42   Ct Head Wo Contrast  Result Date: 03/30/2016 CLINICAL DATA:  Pain following fall EXAM: CT HEAD WITHOUT CONTRAST TECHNIQUE: Contiguous axial images were obtained from the base of the skull through the vertex without intravenous contrast. COMPARISON:  None. FINDINGS: Brain: There is moderate diffuse atrophy with ventricles somewhat prominent with respect to the sulci. There is no intracranial mass, hemorrhage, extra-axial fluid collection, or midline shift. There is mild small vessel disease in the centra semiovale bilaterally. Elsewhere gray-white compartments appear normal. No acute infarct is evident on this study. Vascular: There is no demonstrable hyperdense vessel. There is calcification in the distal left vertebral artery. The vertebral arteries are quite tortuous. There is also calcification in the carotid siphon regions bilaterally. Skull: The bony calvarium appears intact. Sinuses/Orbits: There is mild mucosal thickening in several ethmoid air cells bilaterally. There is mild mucosal thickening in the posterior left maxillary antrum and in the right posterior sphenoid sinus. Orbits appear overall symmetric bilaterally. A small focus of metal is noted along the anterior most aspect of the left globe. Other: Mastoid air cells are clear. IMPRESSION: Atrophy with questionable superimposed normal pressure hydrocephalus. Mild periventricular small vessel disease. No intracranial mass, hemorrhage, or  extra-axial fluid collection. No acute infarct evident. There are foci of arterial vascular calcification. There are foci of paranasal sinus disease. There is a small focus of metal along the anterior most aspect of the left globe. Direct visualization of this area advised. Electronically Signed   By: Lowella Grip III M.D.   On: 03/30/2016 12:22   Mr Brain Wo Contrast  Result Date: 04/02/2016 CLINICAL DATA:  75 year old male with confusion, generalized weakness, gait instability, recurrent falls. Multiple prior lumbar spine surgeries. Question of normal pressure hydrocephalus raised on noncontrast head CT. Initial encounter. EXAM: MRI HEAD WITHOUT CONTRAST TECHNIQUE: Multiplanar, multiecho pulse sequences of the brain and surrounding structures were obtained without intravenous contrast. COMPARISON:  Noncontrast head CT 03/30/2016 FINDINGS: Study is intermittently degraded by motion artifact despite repeated imaging attempts. Brain: No restricted diffusion to suggest acute infarction. No midline shift, mass effect, evidence of mass lesion, extra-axial collection or acute intracranial hemorrhage. Cervicomedullary junction and pituitary are within normal limits. The lateral and third ventricles are prominent as seen on the recent CT, but the temporal horns appear fairly normal. No evidence of hyperdynamic flow at the cerebral aqueduct. The fourth ventricle is normal. No transependymal edema. Minimal periventricular and mild for age other patchy nonspecific mostly anterior frontal lobe white matter T2 and FLAIR hyperintensity. No cortical encephalomalacia or chronic cerebral blood products identified. T2 heterogeneity in the deep gray matter nuclei appears mostly due to perivascular spaces although there is evidence of a small chronic lacunar infarct in the right basal ganglia. Negative brainstem and cerebellum. Vascular: Major intracranial vascular flow voids are preserved with generalized  intracranial artery tortuosity. The distal left vertebral artery is dominant and dolichoectatic. Skull and upper cervical spine: Not well visualized. Normal bone marrow signal at the skullbase and in the upper cervical spine. Sinuses/Orbits: Negative orbits soft tissues, postoperative changes to the left globe.  Trace paranasal sinus mucosal thickening. Other: Visible internal auditory structures appear normal. Mastoids are clear. Negative scalp soft tissues. IMPRESSION: 1. In the appropriate clinical setting normal pressure hydrocephalus would be difficult to exclude. Prominence of the lateral and third ventricles as seen on CT, but otherwise lacking strong imaging evidence of NPH. 2. No acute intracranial abnormality. Generalized intracranial artery dolichoectasia. Mild for age chronic small vessel disease. 3. Motion degraded exam. Electronically Signed   By: Genevie Ann M.D.   On: 04/02/2016 09:08   Ct Abdomen Pelvis W Contrast  Result Date: 03/30/2016 CLINICAL DATA:  Worsening back and posterior pelvic pain with leukocytosis, confusion and recent fall. Previous back surgery. EXAM: CT ABDOMEN AND PELVIS WITH CONTRAST TECHNIQUE: Multidetector CT imaging of the abdomen and pelvis was performed using the standard protocol following bolus administration of intravenous contrast. CONTRAST:  185mL ISOVUE-300 IOPAMIDOL (ISOVUE-300) INJECTION 61% COMPARISON:  Abdominal pelvic CT 10/29/2009. Lumbar spine MRI 07/21/2012 and CT 12/20/2015. FINDINGS: Lower chest: Mild atelectasis at both lung bases. The heart is enlarged. There is atherosclerosis of the aorta and coronary arteries. No significant pleural or pericardial effusion. Hepatobiliary: No suspicious hepatic findings. There are scattered calcified granulomas. Previous cholecystectomy without significant biliary dilatation. Pancreas: Unremarkable. No pancreatic ductal dilatation or surrounding inflammatory changes. Spleen: Normal in size without focal abnormality.  Adrenals/Urinary Tract: Both adrenal glands appear normal. There is an enlarging nonobstructing 10 mm calculus in the lower pole of the left kidney (image 43). No other urinary tract calculi are seen. There is no evidence of hydronephrosis or perinephric soft tissue stranding. There are several left renal cysts, measuring up to 4.7 x 4.0 cm on image 36. No suspicious renal findings. The bladder appears unremarkable. Stomach/Bowel: No evidence of bowel wall thickening, distention or surrounding inflammatory change. The proximal colon is decompressed. The appendix is not seen. Vascular/Lymphatic: There are no enlarged abdominal or pelvic lymph nodes. Aortic and branch vessel atherosclerosis. No evidence of large vessel occlusion. Reproductive: Mild heterogeneous enlargement of the prostate gland. The seminal vesicles appear normal. Other: There is mildly prominent fat in both inguinal canals. There is no herniated bowel. No ascites. Musculoskeletal: No acute osseous findings are demonstrated. There are extensive postsurgical changes within the lumbar spine status post L2 through S1 fusion. Lumbar spine findings are dictated in greater detail on the separate examination of the lumbar spine. IMPRESSION: 1. No acute findings or explanation for the patient's symptoms. 2. Postsurgical and degenerative changes throughout the lumbar spine. Lumbar spine details are dictated separately. 3. Nonobstructing left renal calculus.  Left renal cysts. 4.  Aortic Atherosclerosis (ICD10-170.0) Electronically Signed   By: Richardean Sale M.D.   On: 03/30/2016 16:03   Ct L-spine No Charge  Result Date: 03/30/2016 CLINICAL DATA:  Back pain. EXAM: CT LUMBAR SPINE WITHOUT CONTRAST TECHNIQUE: Multidetector CT imaging of the lumbar spine was performed without intravenous contrast administration. Multiplanar CT image reconstructions were also generated. COMPARISON:  Radiographs of March 05, 2016. FINDINGS: Segmentation: Partially  lumbarized S1 vertebral body is noted. Alignment: Stable grade 1 anterolisthesis of L5-S1 is noted secondary to posterior facet joint hypertrophy. Otherwise good alignment of vertebral bodies is noted. Vertebrae: No acute fracture is noted. Status post surgical posterior fusion extending from L2-S1 with bilateral intrapedicular screw placement. Interbody fusion is also noted at these levels. Status post laminectomy as well extending from L2-S1. Status post left lateral fusion of L3-4. Paraspinal and other soft tissues: No definite evidence of significant bony or soft tissue stenosis seen in  the central spinal canal. Disc levels: See above description for extensive postsurgical changes. IMPRESSION: Extensive postsurgical and degenerative changes as described above. No acute abnormality seen in the lumbar spine. Electronically Signed   By: Marijo Conception, M.D.   On: 03/30/2016 15:56    Assessment/Plan #1-chest x-ray that showed possible pulmonary embolic disease-clinically patient appears stable however chest x-ray results are somewhat concerning-will send him to the ER for expedient evaluation to rule out pulmonary embolism.  I have reviewed previous chest x-ray as well as CT scan which did not really show this-will await Hospital evaluation.  \In regards to labs that appear to show a low potassium of 3.2 if patient returns this will need to be supplemented otherwise labs look to be fairly unremarkable  [place], [place], [place]

## 2016-04-17 NOTE — ED Notes (Signed)
Report given to [facility]

## 2016-04-17 NOTE — ED Triage Notes (Signed)
Pt from [facility], EMS reports pt has history of asbestos exposure and had routine chest xray that should "pulmonic embolic disease". Pt denies any pain or shortness of breath.

## 2016-04-19 NOTE — Telephone Encounter (Signed)
noted 

## 2016-04-20 ENCOUNTER — Non-Acute Institutional Stay (SKILLED_NURSING_FACILITY): Payer: Medicare Other | Admitting: Internal Medicine

## 2016-04-20 ENCOUNTER — Encounter: Payer: Self-pay | Admitting: Internal Medicine

## 2016-04-20 DIAGNOSIS — E876 Hypokalemia: Secondary | ICD-10-CM

## 2016-04-20 DIAGNOSIS — R531 Weakness: Secondary | ICD-10-CM

## 2016-04-20 DIAGNOSIS — I10 Essential (primary) hypertension: Secondary | ICD-10-CM | POA: Diagnosis not present

## 2016-04-20 DIAGNOSIS — E119 Type 2 diabetes mellitus without complications: Secondary | ICD-10-CM | POA: Diagnosis not present

## 2016-04-20 DIAGNOSIS — IMO0001 Reserved for inherently not codable concepts without codable children: Secondary | ICD-10-CM

## 2016-04-20 DIAGNOSIS — Z794 Long term (current) use of insulin: Secondary | ICD-10-CM

## 2016-04-20 DIAGNOSIS — R197 Diarrhea, unspecified: Secondary | ICD-10-CM

## 2016-04-20 NOTE — Progress Notes (Signed)
Location:   [facility] Room Number: 135/P Place of Service:  SNF (31) Provider:  Maxwell Marion, MD  Patient Care Team: [name], MD as PCP - General (Family Medicine) [name], MD as Consulting Physician (Gastroenterology) [name], MD as Consulting Physician (Cardiology)  Extended Emergency Contact Information Primary Emergency Contact: Helene Shoe Address: [address]          [place], [place] [address] Johnnette Litter of [place] Phone: [phone] Mobile Phone: [phone] Relation: Spouse Secondary Emergency Contact: Kizzie Furnish [place] of [place] Phone: [phone] Mobile Phone: [phone] Relation: Daughter  Code Status:  Full Code Goals of care: Advanced Directive information Advanced Directives 04/20/2016  Does patient have an advance directive? Yes  Type of Advance Directive (No Data)  Does patient want to make changes to advanced directive? No - Patient declined  Copy of advanced directive(s) in chart? Yes  Would patient like information on creating an advanced directive? -  Pre-existing out of facility DNR order (yellow form or pink MOST form) -     Chief Complaint  Patient presents with  . Acute Visit    [place]    HPI:  Pt is a 75 y.o. male seen today for an acute visit for Diarrhea. Patient and his wife say that patient has had off and on diarrhea in past few weeks. But it has gotten worse since yesterday.He has been to the bathroom 5 times since this morning. Patient denies any nausea , Vomiting.or abdominal pain. No blood in stool.  Patient had abnormal Chest Xray on Friday and was sent to hospital for further evaluation. His CT scan of Abdomen and Chest were all negative for any acute process. Patient is doing well with therapy. Does continue to have pain in his back.   Past Medical History:  Diagnosis Date  . Anemia, iron deficiency   . Anxiety   . Asbestosis(501)   .  Coronary atherosclerosis of native coronary artery    DES RCA and staged DES LAD May 2015, LVEF 55%  . Essential hypertension, benign   . Gout   . Hx of adenomatous colonic polyps   . Hyperlipidemia   . Kidney carcinoma ([place]) 1998  . Lumbar back pain    Spondylosis and spondylolisthesis   . Myocardial infarction 2004  . Nephrolithiasis   . NSTEMI (non-ST elevated myocardial infarction) [facility])    May 2015  . Obesity   . Sciatica of right side   . Type II diabetes mellitus ([place])    Past Surgical History:  Procedure Laterality Date  . APPENDECTOMY    . BACK SURGERY  X 4  . CHOLECYSTECTOMY     Biliary dyskinesia  . COLONOSCOPY  2009   Dr. Arnoldo Morale: normal  . COLONOSCOPY  June 2011   Dr. Gala Romney: normal rectum, left-sided diverticula  . COLONOSCOPY N/A 04/12/2015   Procedure: COLONOSCOPY;  Surgeon: [name], MD;  Location: [facility];  Service: Endoscopy;  Laterality: N/A;  1115   . CYSTOSCOPY W/ LITHOLAPAXY / EHL  2009  . ESOPHAGOGASTRODUODENOSCOPY  June 2011   Dr. Gala Romney: erosive reflux esophagitis, small hiatal hernia, +H.pylori gastritis  . EYE SURGERY    . HOLMIUM LASER APPLICATION Right [id number]  . LEFT HEART CATHETERIZATION WITH CORONARY ANGIOGRAM N/A 11/09/2013   Procedure: LEFT HEART CATHETERIZATION WITH CORONARY ANGIOGRAM;  Surgeon: [name], MD;  Location: [facility] C[facility] LAB;  Service: Cardiovascular;  Laterality: N/A;  . [place]  SURGERY  X 3  . PERCUTANEOUS CORONARY STENT INTERVENTION (PCI-S) N/A 11/10/2013   Procedure: PERCUTANEOUS CORONARY STENT INTERVENTION (PCI-S);  Surgeon: [name], MD;  Location: [facility] C[facility] LAB;  Service: Cardiovascular;  Laterality: N/A;  . POSTERIOR LUMBAR FUSION  2014  . RETINAL DETACHMENT SURGERY Right 1998   Secondary to injury detached retina/cataracts  . TONSILLECTOMY    . TUMOR EXCISION  1995    Allergies  Allergen Reactions  . Penicillins Hives, Itching and Swelling  . Other     Bee stings  . Atorvastatin Other (See  Comments)    Muscle pain/cramps   . Celexa [Citalopram] Diarrhea      Medication List       Accurate as of 04/20/16 11:37 AM. Always use your most recent med list.          amLODipine 10 MG tablet Commonly known as:  NORVASC Take 1 tablet (10 mg total) by mouth daily.   ASPIRIN 81 81 MG chewable tablet Generic drug:  aspirin Chew 81 mg by mouth daily.   baclofen 20 MG tablet Commonly known as:  LIORESAL Take 20 mg by mouth 3 (three) times daily.   BIOFREEZE 4 % Gel Generic drug:  Menthol (Topical Analgesic) Apply Biofreeze to spine for pain management   carvedilol 12.5 MG tablet Commonly known as:  COREG TAKE 1 TABLET BY MOUTH TWICE DAILY WITH MEALS.   cyanocobalamin 1000 MCG/ML injection Commonly known as:  (VITAMIN B-12) Give 1000 mcg/ml; injection: q 2 weeks once a day every 14 days   DULoxetine 30 MG capsule Commonly known as:  CYMBALTA Take 1 capsule (30 mg total) by mouth daily.   GLUCERNA Liqd Take 237 mLs by mouth 2 (two) times daily between meals.   insulin aspart 100 UNIT/ML FlexPen Commonly known as:  NOVOLOG FLEXPEN INJECT 25 UNITS INTO THE SKIN TWICE DAILY.   insulin glargine 100 UNIT/ML injection Commonly known as:  LANTUS Inject 50 Units into the skin at bedtime.   INVOKANA 300 MG Tabs tablet Generic drug:  canagliflozin TAKE (1) TABLET BY MOUTH ONCE DAILY.   lisinopril 40 MG tablet Commonly known as:  PRINIVIL,ZESTRIL Take 1 tablet (40 mg total) by mouth daily.   loperamide 2 MG tablet Commonly known as:  IMODIUM A-D Take 2 mg by mouth daily at 6 (six) AM.   Melatonin 10 MG Tabs Take 1 tablet by mouth at bedtime.   metFORMIN 500 MG tablet Commonly known as:  GLUCOPHAGE Take 500 mg by mouth every evening.   nitroGLYCERIN 0.4 MG SL tablet Commonly known as:  NITROSTAT Place 1 tablet (0.4 mg total) under the tongue every 5 (five) minutes x 3 doses as needed for chest pain.   oxyCODONE-acetaminophen 10-325 MG tablet Commonly  known as:  PERCOCET Take one tablet by mouth every 4 hours as needed for pain   polyethylene glycol packet Commonly known as:  MIRALAX / GLYCOLAX Take 17 g by mouth daily as needed for mild constipation.   pravastatin 40 MG tablet Commonly known as:  PRAVACHOL TAKE 1 TABLET BY MOUTH AT BEDTIME FOR CHOLESTEROL.       Review of Systems  Constitutional: Positive for activity change and fatigue. Negative for appetite change, chills, diaphoresis and fever.  Respiratory: Negative for apnea, cough, choking, chest tightness, shortness of breath and stridor.   Cardiovascular: Negative for chest pain, palpitations and leg swelling.  Gastrointestinal: Positive for diarrhea. Negative for abdominal distention, abdominal pain, anal bleeding, blood in stool,  constipation and nausea.  Musculoskeletal: Positive for back pain.  Neurological: Negative for dizziness, light-headedness and headaches.    Immunization History  Administered Date(s) Administered  . Influenza Split 03/28/2013  . Influenza,inj,Quad PF,36+ Mos 03/22/2014  . Influenza-Unspecified 03/15/2012, 04/15/2015  . Pneumococcal Conjugate-13 03/22/2014  . Pneumococcal Polysaccharide-23 03/15/2008, 12/23/2012  . Zoster 10/14/2007   Pertinent  Health Maintenance Due  Topic Date Due  . INFLUENZA VACCINE  05/15/2016 (Originally 01/14/2016)  . HEMOGLOBIN A1C  07/10/2016  . FOOT EXAM  08/14/2016  . OPHTHALMOLOGY EXAM  08/29/2016  . COLONOSCOPY  04/11/2020  . PNA vac Low Risk Adult  Completed   Fall Risk  08/15/2015 06/01/2014 06/01/2014 11/28/2013  Falls in the past year? No No No No   Functional Status Survey:    Vitals:   04/20/16 1124  BP: (!) 183/70 repeat was 176/70  Pulse: (!) 59  Resp: 18  Temp: 97.8 F (36.6 C)  TempSrc: Oral   There is no height or weight on file to calculate BMI. Physical Exam  Constitutional: He appears well-developed and well-nourished.  HENT:  Head: Normocephalic.  Mouth/Throat: Oropharynx is  clear and moist.  Cardiovascular: Normal rate, regular rhythm and normal heart sounds.   Pulmonary/Chest: Effort normal and breath sounds normal. No respiratory distress. He has no wheezes. He has no rales.  Abdominal: Soft. Bowel sounds are normal. He exhibits no distension. There is no tenderness. There is no rebound and no guarding.  Musculoskeletal: He exhibits no edema.  Neurological: He is alert.  Skin: Skin is warm. No rash noted. No erythema.    Labs reviewed:  Recent Labs  03/30/16 1104  04/02/16 0643 04/13/16 0700 04/17/16 0700 04/17/16 1155  NA 143  < >  --  142 143 142  K 3.3*  < > 3.6 3.6 3.2* 3.4*  CL 102  < >  --  107 107 105  CO2 30  < >  --  27 28 28   GLUCOSE 124*  < >  --  187* 174* 170*  BUN 15  < >  --  18 14 13   CREATININE 0.70  < >  --  0.79 0.74 0.72  CALCIUM 8.6*  < >  --  8.8* 8.7* 8.9  MG 1.8  --  1.8  --   --   --   < > = values in this interval not displayed.  Recent Labs  03/30/16 1104 04/13/16 0700 04/17/16 1155  AST 21 20 22   ALT 12* 17 16*  ALKPHOS 74 83 93  BILITOT 0.5 0.5 0.6  PROT 7.2 6.9 7.3  ALBUMIN 3.0* 2.8* 2.9*    Recent Labs  03/31/16 0614  04/13/16 0700 04/17/16 0700 04/17/16 1155  WBC 10.2  < > 9.8 9.2 9.8  NEUTROABS 6.8  --   --  5.9 6.4  HGB 10.4*  < > 11.5* 11.2* 11.3*  HCT 34.9*  < > 38.0* 37.9* 38.2*  MCV 82.5  < > 81.9 81.7 82.3  PLT 348  < > 369 348 397  < > = values in this interval not displayed. Lab Results  Component Value Date   TSH 3.190 11/09/2013   Lab Results  Component Value Date   HGBA1C 6.6 (H) 01/08/2016   Lab Results  Component Value Date   CHOL 162 01/08/2016   HDL 36 (L) 01/08/2016   LDLCALC 65 01/08/2016   TRIG 305 (H) 01/08/2016   CHOLHDL 4.5 01/08/2016    Significant Diagnostic Results in last  30 days:  Dg Chest 2 View  Result Date: 03/30/2016 CLINICAL DATA:  Severe right lower quadrant abdominal pain. Generalized weakness. EXAM: CHEST  2 VIEW COMPARISON:  01/15/2016  FINDINGS: Pulmonary vascularity is slightly prominent but the patient is supine. Overall heart size is normal. No infiltrates or effusions. No acute bone abnormality. IMPRESSION: No acute disease in the chest. Electronically Signed   By: Lorriane Shire M.D.   On: 03/30/2016 12:42   Ct Head Wo Contrast  Result Date: 03/30/2016 CLINICAL DATA:  Pain following fall EXAM: CT HEAD WITHOUT CONTRAST TECHNIQUE: Contiguous axial images were obtained from the base of the skull through the vertex without intravenous contrast. COMPARISON:  None. FINDINGS: Brain: There is moderate diffuse atrophy with ventricles somewhat prominent with respect to the sulci. There is no intracranial mass, hemorrhage, extra-axial fluid collection, or midline shift. There is mild small vessel disease in the centra semiovale bilaterally. Elsewhere gray-white compartments appear normal. No acute infarct is evident on this study. Vascular: There is no demonstrable hyperdense vessel. There is calcification in the distal left vertebral artery. The vertebral arteries are quite tortuous. There is also calcification in the carotid siphon regions bilaterally. Skull: The bony calvarium appears intact. Sinuses/Orbits: There is mild mucosal thickening in several ethmoid air cells bilaterally. There is mild mucosal thickening in the posterior left maxillary antrum and in the right posterior sphenoid sinus. Orbits appear overall symmetric bilaterally. A small focus of metal is noted along the anterior most aspect of the left globe. Other: Mastoid air cells are clear. IMPRESSION: Atrophy with questionable superimposed normal pressure hydrocephalus. Mild periventricular small vessel disease. No intracranial mass, hemorrhage, or extra-axial fluid collection. No acute infarct evident. There are foci of arterial vascular calcification. There are foci of paranasal sinus disease. There is a small focus of metal along the anterior most aspect of the left globe.  Direct visualization of this area advised. Electronically Signed   By: Lowella Grip III M.D.   On: 03/30/2016 12:22   Ct Angio Chest Pe W And/or Wo Contrast  Result Date: 04/17/2016 CLINICAL DATA:  Shortness of breath. Diarrhea beginning April 16, 2016. EXAM: CT ANGIOGRAPHY CHEST CT ABDOMEN AND PELVIS WITH CONTRAST TECHNIQUE: Multidetector CT imaging of the chest was performed using the standard protocol during bolus administration of intravenous contrast. Multiplanar CT image reconstructions and MIPs were obtained to evaluate the vascular anatomy. Multidetector CT imaging of the abdomen and pelvis was performed using the standard protocol during bolus administration of intravenous contrast. CONTRAST:  100 mL of Isovue 370 COMPARISON:  CT the abdomen and pelvis March 30, 2016 and CT of the chest August 28, 2015 FINDINGS: CTA CHEST FINDINGS Cardiovascular: 3 vessel coronary artery disease. The heart size is stable without gross cardiomegaly. No effusions. Atherosclerosis seen in the thoracic aorta. No aneurysm or dissection. No pulmonary emboli. Mediastinum/Nodes: No enlarged mediastinal, hilar, or axillary lymph nodes. Thyroid gland, trachea, and esophagus demonstrate no significant findings. Lungs/Pleura: The central airways are normal. No pneumothorax. No suspicious pulmonary nodules, masses, or focal infiltrates. No pleural plaques to suggest asbestos exposure. Musculoskeletal: Multilevel degenerative changes in the thoracic spine. Scoliotic curvature persists. No acute bony abnormalities. Review of the MIP images confirms the above findings. CT ABDOMEN and PELVIS FINDINGS Hepatobiliary: The patient is status post cholecystectomy. The liver and portal vein are otherwise within normal limits. Pancreas: Unremarkable. No pancreatic ductal dilatation or surrounding inflammatory changes. Spleen: Normal in size without focal abnormality. Adrenals/Urinary Tract: The lower pole the right kidney is  not well  assessed due to streak artifact off of lumbar spine hardware. There are multiple small scattered low-attenuation lesions in both kidneys consistent with cysts but too small to completely characterize. A larger cyst is also seen on the right. The 10 mm stone in the lower left kidney or cysts. No hydronephrosis or acute perinephric stranding. No ureterectasis or ureteral stones. Stomach/Bowel: The stomach and small bowel are within normal limits. The colon is normal. The appendix has been surgically removed. Vascular/Lymphatic: Atherosclerotic change involves the aorta in addition to the iliac and femoral vessels with no aneurysm or dissection. No adenopathy. Reproductive: Prostate is unremarkable. Other: Fat containing inguinal hernias remain.  Postsurgical changes Musculoskeletal: Postsurgical changes remain in the lumbar spine. Chronic changes to the lumbar spine are stable. No acute bony abnormalities. Review of the MIP images confirms the above findings. IMPRESSION: 1. No pulmonary emboli or acute abnormality in the chest. 2. No cause for diarrhea identified. 3. Chronic changes in lumbar spine including surgical hardware and significant erosion to the superior half of L3 and the inferior endplate of L2. Electronically Signed   By: Dorise Bullion III M.D   On: 04/17/2016 13:49   Mr Brain Wo Contrast  Result Date: 04/02/2016 CLINICAL DATA:  75 year old male with confusion, generalized weakness, gait instability, recurrent falls. Multiple prior lumbar spine surgeries. Question of normal pressure hydrocephalus raised on noncontrast head CT. Initial encounter. EXAM: MRI HEAD WITHOUT CONTRAST TECHNIQUE: Multiplanar, multiecho pulse sequences of the brain and surrounding structures were obtained without intravenous contrast. COMPARISON:  Noncontrast head CT 03/30/2016 FINDINGS: Study is intermittently degraded by motion artifact despite repeated imaging attempts. Brain: No restricted diffusion to suggest  acute infarction. No midline shift, mass effect, evidence of mass lesion, extra-axial collection or acute intracranial hemorrhage. Cervicomedullary junction and pituitary are within normal limits. The lateral and third ventricles are prominent as seen on the recent CT, but the temporal horns appear fairly normal. No evidence of hyperdynamic flow at the cerebral aqueduct. The fourth ventricle is normal. No transependymal edema. Minimal periventricular and mild for age other patchy nonspecific mostly anterior frontal lobe white matter T2 and FLAIR hyperintensity. No cortical encephalomalacia or chronic cerebral blood products identified. T2 heterogeneity in the deep gray matter nuclei appears mostly due to perivascular spaces although there is evidence of a small chronic lacunar infarct in the right basal ganglia. Negative brainstem and cerebellum. Vascular: Major intracranial vascular flow voids are preserved with generalized intracranial artery tortuosity. The distal left vertebral artery is dominant and dolichoectatic. Skull and upper cervical spine: Not well visualized. Normal bone marrow signal at the skullbase and in the upper cervical spine. Sinuses/Orbits: Negative orbits soft tissues, postoperative changes to the left globe. Trace paranasal sinus mucosal thickening. Other: Visible internal auditory structures appear normal. Mastoids are clear. Negative scalp soft tissues. IMPRESSION: 1. In the appropriate clinical setting normal pressure hydrocephalus would be difficult to exclude. Prominence of the lateral and third ventricles as seen on CT, but otherwise lacking strong imaging evidence of NPH. 2. No acute intracranial abnormality. Generalized intracranial artery dolichoectasia. Mild for age chronic small vessel disease. 3. Motion degraded exam. Electronically Signed   By: Genevie Ann M.D.   On: 04/02/2016 09:08   Ct Abdomen Pelvis W Contrast  Result Date: 04/17/2016 CLINICAL DATA:  Shortness of breath.  Diarrhea beginning April 16, 2016. EXAM: CT ANGIOGRAPHY CHEST CT ABDOMEN AND PELVIS WITH CONTRAST TECHNIQUE: Multidetector CT imaging of the chest was performed using the standard protocol  during bolus administration of intravenous contrast. Multiplanar CT image reconstructions and MIPs were obtained to evaluate the vascular anatomy. Multidetector CT imaging of the abdomen and pelvis was performed using the standard protocol during bolus administration of intravenous contrast. CONTRAST:  100 mL of Isovue 370 COMPARISON:  CT the abdomen and pelvis March 30, 2016 and CT of the chest August 28, 2015 FINDINGS: CTA CHEST FINDINGS Cardiovascular: 3 vessel coronary artery disease. The heart size is stable without gross cardiomegaly. No effusions. Atherosclerosis seen in the thoracic aorta. No aneurysm or dissection. No pulmonary emboli. Mediastinum/Nodes: No enlarged mediastinal, hilar, or axillary lymph nodes. Thyroid gland, trachea, and esophagus demonstrate no significant findings. Lungs/Pleura: The central airways are normal. No pneumothorax. No suspicious pulmonary nodules, masses, or focal infiltrates. No pleural plaques to suggest asbestos exposure. Musculoskeletal: Multilevel degenerative changes in the thoracic spine. Scoliotic curvature persists. No acute bony abnormalities. Review of the MIP images confirms the above findings. CT ABDOMEN and PELVIS FINDINGS Hepatobiliary: The patient is status post cholecystectomy. The liver and portal vein are otherwise within normal limits. Pancreas: Unremarkable. No pancreatic ductal dilatation or surrounding inflammatory changes. Spleen: Normal in size without focal abnormality. Adrenals/Urinary Tract: The lower pole the right kidney is not well assessed due to streak artifact off of lumbar spine hardware. There are multiple small scattered low-attenuation lesions in both kidneys consistent with cysts but too small to completely characterize. A larger cyst is also seen on  the right. The 10 mm stone in the lower left kidney or cysts. No hydronephrosis or acute perinephric stranding. No ureterectasis or ureteral stones. Stomach/Bowel: The stomach and small bowel are within normal limits. The colon is normal. The appendix has been surgically removed. Vascular/Lymphatic: Atherosclerotic change involves the aorta in addition to the iliac and femoral vessels with no aneurysm or dissection. No adenopathy. Reproductive: Prostate is unremarkable. Other: Fat containing inguinal hernias remain.  Postsurgical changes Musculoskeletal: Postsurgical changes remain in the lumbar spine. Chronic changes to the lumbar spine are stable. No acute bony abnormalities. Review of the MIP images confirms the above findings. IMPRESSION: 1. No pulmonary emboli or acute abnormality in the chest. 2. No cause for diarrhea identified. 3. Chronic changes in lumbar spine including surgical hardware and significant erosion to the superior half of L3 and the inferior endplate of L2. Electronically Signed   By: Dorise Bullion III M.D   On: 04/17/2016 13:49   Ct Abdomen Pelvis W Contrast  Result Date: 03/30/2016 CLINICAL DATA:  Worsening back and posterior pelvic pain with leukocytosis, confusion and recent fall. Previous back surgery. EXAM: CT ABDOMEN AND PELVIS WITH CONTRAST TECHNIQUE: Multidetector CT imaging of the abdomen and pelvis was performed using the standard protocol following bolus administration of intravenous contrast. CONTRAST:  143mL ISOVUE-300 IOPAMIDOL (ISOVUE-300) INJECTION 61% COMPARISON:  Abdominal pelvic CT 10/29/2009. Lumbar spine MRI 07/21/2012 and CT 12/20/2015. FINDINGS: Lower chest: Mild atelectasis at both lung bases. The heart is enlarged. There is atherosclerosis of the aorta and coronary arteries. No significant pleural or pericardial effusion. Hepatobiliary: No suspicious hepatic findings. There are scattered calcified granulomas. Previous cholecystectomy without significant biliary  dilatation. Pancreas: Unremarkable. No pancreatic ductal dilatation or surrounding inflammatory changes. Spleen: Normal in size without focal abnormality. Adrenals/Urinary Tract: Both adrenal glands appear normal. There is an enlarging nonobstructing 10 mm calculus in the lower pole of the left kidney (image 43). No other urinary tract calculi are seen. There is no evidence of hydronephrosis or perinephric soft tissue stranding. There are several left renal  cysts, measuring up to 4.7 x 4.0 cm on image 36. No suspicious renal findings. The bladder appears unremarkable. Stomach/Bowel: No evidence of bowel wall thickening, distention or surrounding inflammatory change. The proximal colon is decompressed. The appendix is not seen. Vascular/Lymphatic: There are no enlarged abdominal or pelvic lymph nodes. Aortic and branch vessel atherosclerosis. No evidence of large vessel occlusion. Reproductive: Mild heterogeneous enlargement of the prostate gland. The seminal vesicles appear normal. Other: There is mildly prominent fat in both inguinal canals. There is no herniated bowel. No ascites. Musculoskeletal: No acute osseous findings are demonstrated. There are extensive postsurgical changes within the lumbar spine status post L2 through S1 fusion. Lumbar spine findings are dictated in greater detail on the separate examination of the lumbar spine. IMPRESSION: 1. No acute findings or explanation for the patient's symptoms. 2. Postsurgical and degenerative changes throughout the lumbar spine. Lumbar spine details are dictated separately. 3. Nonobstructing left renal calculus.  Left renal cysts. 4.  Aortic Atherosclerosis (ICD10-170.0) Electronically Signed   By: Richardean Sale M.D.   On: 03/30/2016 16:03   Ct L-spine No Charge  Result Date: 03/30/2016 CLINICAL DATA:  Back pain. EXAM: CT LUMBAR SPINE WITHOUT CONTRAST TECHNIQUE: Multidetector CT imaging of the lumbar spine was performed without intravenous contrast  administration. Multiplanar CT image reconstructions were also generated. COMPARISON:  Radiographs of March 05, 2016. FINDINGS: Segmentation: Partially lumbarized S1 vertebral body is noted. Alignment: Stable grade 1 anterolisthesis of L5-S1 is noted secondary to posterior facet joint hypertrophy. Otherwise good alignment of vertebral bodies is noted. Vertebrae: No acute fracture is noted. Status post surgical posterior fusion extending from L2-S1 with bilateral intrapedicular screw placement. Interbody fusion is also noted at these levels. Status post laminectomy as well extending from L2-S1. Status post left lateral fusion of L3-4. Paraspinal and other soft tissues: No definite evidence of significant bony or soft tissue stenosis seen in the central spinal canal. Disc levels: See above description for extensive postsurgical changes. IMPRESSION: Extensive postsurgical and degenerative changes as described above. No acute abnormality seen in the lumbar spine. Electronically Signed   By: Marijo Conception, M.D.   On: 03/30/2016 15:56    Assessment/Plan  Diarrhea Patient has recently been on Antibiotics Check Stool for C.diff.  Essential hypertension  Patient BP has been consistently high. Will increase his carvedilol.  Generalized weakness  Patient doing well with pain control and thearapy  Hypokalemia Will start on potassium supplement.     Family/ staff Communication:   Labs/tests ordered:

## 2016-04-21 ENCOUNTER — Other Ambulatory Visit (HOSPITAL_COMMUNITY)
Admission: RE | Admit: 2016-04-21 | Discharge: 2016-04-21 | Disposition: A | Discharge status: Home / Self Care | Payer: Medicare Other | Source: Skilled Nursing Facility | Attending: Internal Medicine | Admitting: Internal Medicine

## 2016-04-21 DIAGNOSIS — A0472 Enterocolitis due to Clostridium difficile, not specified as recurrent: Secondary | ICD-10-CM | POA: Insufficient documentation

## 2016-04-21 LAB — C DIFFICILE QUICK SCREEN W PCR REFLEX
C DIFFICILE (CDIFF) INTERP: NOT DETECTED
C Diff antigen: NEGATIVE
C Diff toxin: NEGATIVE

## 2016-04-22 ENCOUNTER — Ambulatory Visit: Payer: Medicare Other | Admitting: Family Medicine

## 2016-04-26 ENCOUNTER — Encounter (HOSPITAL_COMMUNITY)
Admission: RE | Admit: 2016-04-26 | Discharge: 2016-04-26 | Disposition: A | Discharge status: Home / Self Care | Payer: Medicare Other | Attending: Internal Medicine | Admitting: Internal Medicine

## 2016-04-26 LAB — C DIFFICILE QUICK SCREEN W PCR REFLEX
C DIFFICILE (CDIFF) INTERP: NOT DETECTED
C Diff antigen: NEGATIVE
C Diff toxin: NEGATIVE

## 2016-04-27 ENCOUNTER — Encounter (HOSPITAL_COMMUNITY)
Admission: RE | Admit: 2016-04-27 | Discharge: 2016-04-27 | Disposition: A | Discharge status: Home / Self Care | Payer: Medicare Other | Source: Skilled Nursing Facility | Attending: Internal Medicine | Admitting: Internal Medicine

## 2016-04-27 LAB — BASIC METABOLIC PANEL
ANION GAP: 7 (ref 5–15)
BUN: 22 mg/dL — ABNORMAL HIGH (ref 6–20)
CALCIUM: 8.9 mg/dL (ref 8.9–10.3)
CO2: 28 mmol/L (ref 22–32)
CREATININE: 1.04 mg/dL (ref 0.61–1.24)
Chloride: 108 mmol/L (ref 101–111)
Glucose, Bld: 179 mg/dL — ABNORMAL HIGH (ref 65–99)
Potassium: 4.6 mmol/L (ref 3.5–5.1)
SODIUM: 143 mmol/L (ref 135–145)

## 2016-04-30 ENCOUNTER — Encounter: Payer: Self-pay | Admitting: Family Medicine

## 2016-05-04 ENCOUNTER — Non-Acute Institutional Stay (SKILLED_NURSING_FACILITY): Payer: Medicare Other | Admitting: Internal Medicine

## 2016-05-04 ENCOUNTER — Encounter: Payer: Self-pay | Admitting: Internal Medicine

## 2016-05-04 DIAGNOSIS — I10 Essential (primary) hypertension: Secondary | ICD-10-CM | POA: Diagnosis not present

## 2016-05-04 DIAGNOSIS — E876 Hypokalemia: Secondary | ICD-10-CM

## 2016-05-04 DIAGNOSIS — Z794 Long term (current) use of insulin: Secondary | ICD-10-CM

## 2016-05-04 DIAGNOSIS — M5441 Lumbago with sciatica, right side: Secondary | ICD-10-CM | POA: Diagnosis not present

## 2016-05-04 DIAGNOSIS — I2510 Atherosclerotic heart disease of native coronary artery without angina pectoris: Secondary | ICD-10-CM | POA: Diagnosis not present

## 2016-05-04 DIAGNOSIS — E119 Type 2 diabetes mellitus without complications: Secondary | ICD-10-CM

## 2016-05-04 DIAGNOSIS — G8929 Other chronic pain: Secondary | ICD-10-CM

## 2016-05-04 DIAGNOSIS — R531 Weakness: Secondary | ICD-10-CM | POA: Diagnosis not present

## 2016-05-04 DIAGNOSIS — IMO0001 Reserved for inherently not codable concepts without codable children: Secondary | ICD-10-CM

## 2016-05-04 NOTE — Progress Notes (Signed)
Location:   [facility] Room Number: 135/P Place of Service:  SNF (31)  Provider: Dixie Jafri,Suhaas Agena  PCP: Sallee Lange, MD Patient Care Team: [name], MD as PCP - General (Family Medicine) [name], MD as Consulting Physician (Gastroenterology) [name], MD as Consulting Physician (Cardiology)  Extended Emergency Contact Information Primary Emergency Contact: Shurtz,Rebecca Address: [address]          [place], [place] [address] Johnnette Litter of [place] Phone: [phone] Mobile Phone: [phone] Relation: Spouse Secondary Emergency Contact: Kizzie Furnish [place] of [place] Phone: [phone] Mobile Phone: [phone] Relation: Daughter  Code Status: Full Code Goals of care:  Advanced Directive information Advanced Directives 05/04/2016  Does patient have an advance directive? Yes  Type of Advance Directive Out of facility DNR (pink MOST or yellow form)  Does patient want to make changes to advanced directive? No - Patient declined  Copy of advanced directive(s) in chart? Yes  Would patient like information on creating an advanced directive? -  Pre-existing out of facility DNR order (yellow form or pink MOST form) -     Allergies  Allergen Reactions  . Penicillins Hives, Itching and Swelling  . Other     Bee stings  . Atorvastatin Other (See Comments)    Muscle pain/cramps   . Celexa [Citalopram] Diarrhea    Chief Complaint  Patient presents with  . Discharge Note    HPI:  75 y.o. male  seen today for discharge--. He has been here for rehabilitation with a history of chronic back pain with weakness.  Apparently had surgery in August and had continued significant deconditioning and weakness.  Unable to walk without assistance.  He had been started on Cymbalta and apparently improved his mood but continued to be weak and confused.  He also had multiple falls at home.  He was admitted to hospital with  question UTI though cultures were eventually negative-he was hydrated MRI showed possible NPH but it was thought this was progressive because of age-related atrophy.  His B12 level was low and this was supplemented.  He lives with his wife and daughter and he'll be returning there.  His rehabilitation here appears to have been quite challenging he is still nonambulatory and will need significant assistance including a wheelchair slide board lift and a drop arm bedside commode.  Also will need continued PT and OT as well as nursing CNA support.  At this point his pain appears to be fairly well controlled on Percocet 10-3 25 mg every 4 hours when necessary he is also receiving baclofen.20 milligrams 3 times a day as well as topical bio freeze  He says he usually feels the pain when he is sitting up when lying down apparently is not having significant pain but feels he needs these pain medications on a when necessary basis stand only control this discomfort.  In regards to other medical issues he has run somewhat high blood pressures Dr. Lyndel Safe did increase his Coreg he's also on Norvasc 10 mg a day and lisinopril 40 mg a day this appears to have moderated some at one point systolics appear to be in the 170 to 180s now appears to be more in the 130s to 160 range this will probably need further titration by primary care provider.  Regards to diabetes type 2 he is on  Ivokana Lantus 50 units daily at bedtime as well as NovoLog 25 units at 8 AM and 5 PM, as  well as Glucophage which was restarted  sugars appear to be more in the lower mid 100s up to the lower 200s at times.  Regards coronary artery disease this is been relatively asymptomatic E is on aspirin.  At one point he was complaining of diarrhea C. difficile culture was obtained which was negative.  His potassium was also found be slightly low and this was supplemented and normalized we will update this before discharge.  He did have a recent  chest x-ray earlier this month that showed possible pulmonary embolic disease-however he went to the ER were a CTof the chest was unremarkable  Currently he is resting in bed comfortably but will need extensive assistance when he goes home again his daughter is a nurse which apparently is of considerable help he does live with his wife      Past Medical History:  Diagnosis Date  . Anemia, iron deficiency   . Anxiety   . Asbestosis(501)   . Coronary atherosclerosis of native coronary artery    DES RCA and staged DES LAD May 2015, LVEF 55%  . Essential hypertension, benign   . Gout   . Hx of adenomatous colonic polyps   . Hyperlipidemia   . Kidney carcinoma ([place]) 1998  . Lumbar back pain    Spondylosis and spondylolisthesis   . Myocardial infarction 2004  . Nephrolithiasis   . NSTEMI (non-ST elevated myocardial infarction) [facility])    May 2015  . Obesity   . Sciatica of right side   . Type II diabetes mellitus ([place])     Past Surgical History:  Procedure Laterality Date  . APPENDECTOMY    . BACK SURGERY  X 4  . CHOLECYSTECTOMY     Biliary dyskinesia  . COLONOSCOPY  2009   Dr. Arnoldo Morale: normal  . COLONOSCOPY  June 2011   Dr. Gala Romney: normal rectum, left-sided diverticula  . COLONOSCOPY N/A 04/12/2015   Procedure: COLONOSCOPY;  Surgeon: [name], MD;  Location: [facility];  Service: Endoscopy;  Laterality: N/A;  1115   . CYSTOSCOPY W/ LITHOLAPAXY / EHL  2009  . ESOPHAGOGASTRODUODENOSCOPY  June 2011   Dr. Gala Romney: erosive reflux esophagitis, small hiatal hernia, +H.pylori gastritis  . EYE SURGERY    . HOLMIUM LASER APPLICATION Right [id number]  . LEFT HEART CATHETERIZATION WITH CORONARY ANGIOGRAM N/A 11/09/2013   Procedure: LEFT HEART CATHETERIZATION WITH CORONARY ANGIOGRAM;  Surgeon: [name], MD;  Location: [facility] CATH LAB;  Service: Cardiovascular;  Laterality: N/A;  . LUMBAR DISC SURGERY  X 3  . PERCUTANEOUS CORONARY STENT INTERVENTION (PCI-S) N/A 11/10/2013    Procedure: PERCUTANEOUS CORONARY STENT INTERVENTION (PCI-S);  Surgeon: [name], MD;  Location: [facility] CATH LAB;  Service: Cardiovascular;  Laterality: N/A;  . POSTERIOR LUMBAR FUSION  2014  . RETINAL DETACHMENT SURGERY Right 1998   Secondary to injury detached retina/cataracts  . TONSILLECTOMY    . TUMOR EXCISION  1995      reports that he has never smoked. His smokeless tobacco use includes Chew. He reports that he does not drink alcohol or use drugs. Social History   Social History  . Marital status: Married    Spouse name: N/A  . Number of children: N/A  . Years of education: N/A   Occupational History  . Not on file.   Social History Main Topics  . Smoking status: Never Smoker  . Smokeless tobacco: Current User    Types: Chew  . Alcohol use No  .  Drug use: No  . Sexual activity: Not on file   Other Topics Concern  . Not on file   Social History Narrative  . No narrative on file   Functional Status Survey:    Allergies  Allergen Reactions  . Penicillins Hives, Itching and Swelling  . Other     Bee stings  . Atorvastatin Other (See Comments)    Muscle pain/cramps   . Celexa [Citalopram] Diarrhea    Pertinent  Health Maintenance Due  Topic Date Due  . INFLUENZA VACCINE  05/15/2016 (Originally 01/14/2016)  . HEMOGLOBIN A1C  07/10/2016  . FOOT EXAM  08/14/2016  . OPHTHALMOLOGY EXAM  08/29/2016  . COLONOSCOPY  04/11/2020  . PNA vac Low Risk Adult  Completed    Medications:   Medication List       Accurate as of 05/04/16  1:49 PM. Always use your most recent med list.          amLODipine 10 MG tablet Commonly known as:  NORVASC Take 1 tablet (10 mg total) by mouth daily.   ASPIRIN 81 81 MG chewable tablet Generic drug:  aspirin Chew 81 mg by mouth daily.   baclofen 20 MG tablet Commonly known as:  LIORESAL Take 20 mg by mouth 3 (three) times daily.   BIOFREEZE 4 % Gel Generic drug:  Menthol (Topical Analgesic) Apply Biofreeze to spine  for pain management   carvedilol 12.5 MG tablet Commonly known as:  COREG TAKE 1 TABLET BY MOUTH TWICE DAILY WITH MEALS.   cyanocobalamin 1000 MCG/ML injection Commonly known as:  (VITAMIN B-12) Give 1000 mcg/ml; injection: q 2 weeks once a day every 14 days   DULoxetine 60 MG capsule Commonly known as:  CYMBALTA Take 60 mg by mouth daily.   GLUCERNA Liqd Take 237 mLs by mouth 2 (two) times daily between meals.   insulin aspart 100 UNIT/ML FlexPen Commonly known as:  NOVOLOG FLEXPEN INJECT 25 UNITS INTO THE SKIN TWICE DAILY.   insulin glargine 100 UNIT/ML injection Commonly known as:  LANTUS Inject 50 Units into the skin at bedtime.   INVOKANA 300 MG Tabs tablet Generic drug:  canagliflozin TAKE (1) TABLET BY MOUTH ONCE DAILY.   lisinopril 40 MG tablet Commonly known as:  PRINIVIL,ZESTRIL Take 1 tablet (40 mg total) by mouth daily.   Melatonin 10 MG Tabs Take 1 tablet by mouth at bedtime.   metFORMIN 500 MG tablet Commonly known as:  GLUCOPHAGE Take 500 mg by mouth every evening.   nitroGLYCERIN 0.4 MG SL tablet Commonly known as:  NITROSTAT Place 1 tablet (0.4 mg total) under the tongue every 5 (five) minutes x 3 doses as needed for chest pain.   oxyCODONE-acetaminophen 10-325 MG tablet Commonly known as:  PERCOCET Take one tablet by mouth every 4 hours as needed for pain   polyethylene glycol packet Commonly known as:  MIRALAX / GLYCOLAX Take 17 g by mouth daily as needed for mild constipation.   pravastatin 40 MG tablet Commonly known as:  PRAVACHOL TAKE 1 TABLET BY MOUTH AT BEDTIME FOR CHOLESTEROL.       Review of Systems  Is not complaining of any fever or chills continues to have  weakness as noted above  Skin does not complain of rashes or itching.  Head ears eyes nose mouth and throat does not complaining of sore throat or visual changes.  Her story denies shortness of breath or cough.  Cardiac denies chest pain or significant leg  edema.  GI  is not complaining of nausea vomiting diarrhea or constipation or abdominal discomfort.  Musculoskeletal continues to have significant weakness and back pain at times especially when sitting up.  Neurologic is not complaining of dizziness headache syncope does have weakness.  Psych is not complaining of depression or anxiety he is on Cymbalta appears to be tolerating this well he does have some confusion which is baseline    Vitals:   05/04/16 1337  BP: 135/68  Pulse: (!) 57  Resp: 20  Temp: 97.4 F (36.3 C)  TempSrc: Oral   There is no height or weight on file to calculate BMI. Physical Exam Constitutional: He appears well-developed and well-nourished. Lying in bed he does not appear to be uncomfortable.  His skin is warm and dry HENT:  Mouth/Throat: Oropharynx is clear and moist.  Cardiovascular: Normal rate, regular rhythm and normal heart sounds.   Pulmonary/Chest: Effort normal and breath sounds normal. No respiratory distress. He has no wheezes. He has no rales. He exhibits no tenderness.  Abdominal: Soft. Bowel sounds are normal. He exhibits no distension and no mass. There is no tenderness. There is no rebound and no guarding.  Musculoskeletal: He exhibits no edema. Pedal pulses are intact.  Does appear to have lower extremity weakness as a parasite 3 out of 5 in both extremities-complains of lower left back pain with significant movement-upper extremity strength appears preserved  Neurological: He is alert. With significant weakness   was able to carry on  appropriate conversation but does have history of confusion   Labs reviewed: Basic Metabolic Panel:  Recent Labs  03/30/16 1104  04/02/16 0643  04/17/16 0700 04/17/16 1155 04/27/16 0930  NA 143  < >  --   < > 143 142 143  K 3.3*  < > 3.6  < > 3.2* 3.4* 4.6  CL 102  < >  --   < > 107 105 108  CO2 30  < >  --   < > 28 28 28   GLUCOSE 124*  < >  --   < > 174* 170* 179*  BUN 15  < >  --   < > 14  13 22*  CREATININE 0.70  < >  --   < > 0.74 0.72 1.04  CALCIUM 8.6*  < >  --   < > 8.7* 8.9 8.9  MG 1.8  --  1.8  --   --   --   --   < > = values in this interval not displayed. Liver Function Tests:  Recent Labs  03/30/16 1104 04/13/16 0700 04/17/16 1155  AST 21 20 22   ALT 12* 17 16*  ALKPHOS 74 83 93  BILITOT 0.5 0.5 0.6  PROT 7.2 6.9 7.3  ALBUMIN 3.0* 2.8* 2.9*   No results for input(s): LIPASE, AMYLASE in the last 8760 hours. No results for input(s): AMMONIA in the last 8760 hours. CBC:  Recent Labs  03/31/16 0614  04/13/16 0700 04/17/16 0700 04/17/16 1155  WBC 10.2  < > 9.8 9.2 9.8  NEUTROABS 6.8  --   --  5.9 6.4  HGB 10.4*  < > 11.5* 11.2* 11.3*  HCT 34.9*  < > 38.0* 37.9* 38.2*  MCV 82.5  < > 81.9 81.7 82.3  PLT 348  < > 369 348 397  < > = values in this interval not displayed. Cardiac Enzymes:  Recent Labs  03/30/16 1104 04/17/16 1155 04/17/16 1441  TROPONINI 0.05* 0.03* 0.03*   BNP:  Invalid input(s): POCBNP CBG:  Recent Labs  04/02/16 1634 04/02/16 2124 04/03/16 0734  GLUCAP 163* 186* 154*    Procedures and Imaging Studies During Stay: Ct Angio Chest Pe W And/or Wo Contrast  Result Date: 04/17/2016 CLINICAL DATA:  Shortness of breath. Diarrhea beginning April 16, 2016. EXAM: CT ANGIOGRAPHY CHEST CT ABDOMEN AND PELVIS WITH CONTRAST TECHNIQUE: Multidetector CT imaging of the chest was performed using the standard protocol during bolus administration of intravenous contrast. Multiplanar CT image reconstructions and MIPs were obtained to evaluate the vascular anatomy. Multidetector CT imaging of the abdomen and pelvis was performed using the standard protocol during bolus administration of intravenous contrast. CONTRAST:  100 mL of Isovue 370 COMPARISON:  CT the abdomen and pelvis March 30, 2016 and CT of the chest August 28, 2015 FINDINGS: CTA CHEST FINDINGS Cardiovascular: 3 vessel coronary artery disease. The heart size is stable without gross  cardiomegaly. No effusions. Atherosclerosis seen in the thoracic aorta. No aneurysm or dissection. No pulmonary emboli. Mediastinum/Nodes: No enlarged mediastinal, hilar, or axillary lymph nodes. Thyroid gland, trachea, and esophagus demonstrate no significant findings. Lungs/Pleura: The central airways are normal. No pneumothorax. No suspicious pulmonary nodules, masses, or focal infiltrates. No pleural plaques to suggest asbestos exposure. Musculoskeletal: Multilevel degenerative changes in the thoracic spine. Scoliotic curvature persists. No acute bony abnormalities. Review of the MIP images confirms the above findings. CT ABDOMEN and PELVIS FINDINGS Hepatobiliary: The patient is status post cholecystectomy. The liver and portal vein are otherwise within normal limits. Pancreas: Unremarkable. No pancreatic ductal dilatation or surrounding inflammatory changes. Spleen: Normal in size without focal abnormality. Adrenals/Urinary Tract: The lower pole the right kidney is not well assessed due to streak artifact off of lumbar spine hardware. There are multiple small scattered low-attenuation lesions in both kidneys consistent with cysts but too small to completely characterize. A larger cyst is also seen on the right. The 10 mm stone in the lower left kidney or cysts. No hydronephrosis or acute perinephric stranding. No ureterectasis or ureteral stones. Stomach/Bowel: The stomach and small bowel are within normal limits. The colon is normal. The appendix has been surgically removed. Vascular/Lymphatic: Atherosclerotic change involves the aorta in addition to the iliac and femoral vessels with no aneurysm or dissection. No adenopathy. Reproductive: Prostate is unremarkable. Other: Fat containing inguinal hernias remain.  Postsurgical changes Musculoskeletal: Postsurgical changes remain in the lumbar spine. Chronic changes to the lumbar spine are stable. No acute bony abnormalities. Review of the MIP images confirms the  above findings. IMPRESSION: 1. No pulmonary emboli or acute abnormality in the chest. 2. No cause for diarrhea identified. 3. Chronic changes in lumbar spine including surgical hardware and significant erosion to the superior half of L3 and the inferior endplate of L2. Electronically Signed   By: Dorise Bullion III M.D   On: 04/17/2016 13:49   Ct Abdomen Pelvis W Contrast  Result Date: 04/17/2016 CLINICAL DATA:  Shortness of breath. Diarrhea beginning April 16, 2016. EXAM: CT ANGIOGRAPHY CHEST CT ABDOMEN AND PELVIS WITH CONTRAST TECHNIQUE: Multidetector CT imaging of the chest was performed using the standard protocol during bolus administration of intravenous contrast. Multiplanar CT image reconstructions and MIPs were obtained to evaluate the vascular anatomy. Multidetector CT imaging of the abdomen and pelvis was performed using the standard protocol during bolus administration of intravenous contrast. CONTRAST:  100 mL of Isovue 370 COMPARISON:  CT the abdomen and pelvis March 30, 2016 and CT of the chest August 28, 2015 FINDINGS: CTA CHEST FINDINGS  Cardiovascular: 3 vessel coronary artery disease. The heart size is stable without gross cardiomegaly. No effusions. Atherosclerosis seen in the thoracic aorta. No aneurysm or dissection. No pulmonary emboli. Mediastinum/Nodes: No enlarged mediastinal, hilar, or axillary lymph nodes. Thyroid gland, trachea, and esophagus demonstrate no significant findings. Lungs/Pleura: The central airways are normal. No pneumothorax. No suspicious pulmonary nodules, masses, or focal infiltrates. No pleural plaques to suggest asbestos exposure. Musculoskeletal: Multilevel degenerative changes in the thoracic spine. Scoliotic curvature persists. No acute bony abnormalities. Review of the MIP images confirms the above findings. CT ABDOMEN and PELVIS FINDINGS Hepatobiliary: The patient is status post cholecystectomy. The liver and portal vein are otherwise within normal limits.  Pancreas: Unremarkable. No pancreatic ductal dilatation or surrounding inflammatory changes. Spleen: Normal in size without focal abnormality. Adrenals/Urinary Tract: The lower pole the right kidney is not well assessed due to streak artifact off of lumbar spine hardware. There are multiple small scattered low-attenuation lesions in both kidneys consistent with cysts but too small to completely characterize. A larger cyst is also seen on the right. The 10 mm stone in the lower left kidney or cysts. No hydronephrosis or acute perinephric stranding. No ureterectasis or ureteral stones. Stomach/Bowel: The stomach and small bowel are within normal limits. The colon is normal. The appendix has been surgically removed. Vascular/Lymphatic: Atherosclerotic change involves the aorta in addition to the iliac and femoral vessels with no aneurysm or dissection. No adenopathy. Reproductive: Prostate is unremarkable. Other: Fat containing inguinal hernias remain.  Postsurgical changes Musculoskeletal: Postsurgical changes remain in the lumbar spine. Chronic changes to the lumbar spine are stable. No acute bony abnormalities. Review of the MIP images confirms the above findings. IMPRESSION: 1. No pulmonary emboli or acute abnormality in the chest. 2. No cause for diarrhea identified. 3. Chronic changes in lumbar spine including surgical hardware and significant erosion to the superior half of L3 and the inferior endplate of L2. Electronically Signed   By: Dorise Bullion III M.D   On: 04/17/2016 13:49    Assessment/Plan:    #1 generalized weakness-this continues to be quite a challenge he continues on oxycodone 10-3 25 mg every 4 hours as needed for pain he says this is necessary when sitting up also on baclofen 20 mg 3 times a day-he continues to need extensive support secondary to nonambulatory status will need PT and OT at home as well as nursing support in the CNA to help with ADLs in regards to equipment will need a  wheelchair a left slight board any drop arm bedside commode.  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  #2 hypertension he is on Norvasc Coreg and lisinopril Coreg as noted above was recently increased systolic blood pressures do appear somewhat improved although at times elevated since recent adjustments were made will defer further adjustments to primary care provider.  #3 diabetes type 2 he is on 50 units of Lantus as well as NovoLog 25 units twice a day-at breakfast and supper-as well as Glucophage 500 mg every evening   And Ivokana 300 mg a day-- -blood sugars somewhat variable appears to be mainly more in the mid 100s to lower 200s average at this point will defer to primary care provider.  #4 history coronary artery disease this is been largely symptomatic he is on aspirin.  #5 history of depression he is on Cymbalta appears to be tolerating this well hopefully this will have some benefits with his  pain control as well.   #6 history B12 deficiency this is being supplemented Will update level before discharge.  Of note patient also has a history of hypokalemia which has been supplemented in the past-we will update this to ensure stability  Patient is being discharged with the following home health services: PT OT for strengthening with history of significant weakness and pain as well as nursing support CNA to help with activities of daily living   Patient is being discharged with the following durable medical equipment: Including a wheelchair-left-slide board-drop arm bedside commode-secondary to nonambulatory status   Patient has been advised to f/u with their PCP in 1-2 weeks to bring them up to date on their rehab stay.  Social services at facility was responsible for arranging this appointment.  Pt was provided with a 30 day supply of prescriptions for medications and refills must be obtained from their PCP.  For controlled substances,  a more limited supply may be provided adequate until PCP appointment only.  Future labs/tests needed:   B12 level and BMP which will be drawn before discharge  [id number]-of note greater than 30 minutes spent on this discharge summary-greater than 50% of time spent coordinating plan of care for numerous diagnoses

## 2016-05-05 ENCOUNTER — Encounter (HOSPITAL_COMMUNITY)
Admission: RE | Admit: 2016-05-05 | Discharge: 2016-05-05 | Disposition: A | Discharge status: Home / Self Care | Payer: Medicare Other | Source: Skilled Nursing Facility | Attending: *Deleted | Admitting: *Deleted

## 2016-05-05 LAB — BASIC METABOLIC PANEL
Anion gap: 8 (ref 5–15)
BUN: 17 mg/dL (ref 6–20)
CHLORIDE: 106 mmol/L (ref 101–111)
CO2: 28 mmol/L (ref 22–32)
CREATININE: 0.89 mg/dL (ref 0.61–1.24)
Calcium: 9.2 mg/dL (ref 8.9–10.3)
GFR calc Af Amer: >60 mL/min (ref >60)
GFR calc non Af Amer: >60 mL/min (ref >60)
Glucose, Bld: 213 mg/dL — ABNORMAL HIGH (ref 65–99)
Potassium: 4.7 mmol/L (ref 3.5–5.1)
SODIUM: 142 mmol/L (ref 135–145)

## 2016-05-05 LAB — CBC
HCT: 38.5 % — ABNORMAL LOW (ref 39.0–52.0)
Hemoglobin: 11.3 g/dL — ABNORMAL LOW (ref 13.0–17.0)
MCH: 24.2 pg — AB (ref 26.0–34.0)
MCHC: 29.4 g/dL — AB (ref 30.0–36.0)
MCV: 82.6 fL (ref 78.0–100.0)
PLATELETS: 266 10*3/uL (ref 150–400)
RBC: 4.66 MIL/uL (ref 4.22–5.81)
RDW: 18 % — ABNORMAL HIGH (ref 11.5–15.5)
WBC: 8.1 10*3/uL (ref 4.0–10.5)

## 2016-05-05 LAB — VITAMIN B12: VITAMIN B 12: 378 pg/mL (ref 180–914)

## 2016-05-08 DIAGNOSIS — M47816 Spondylosis without myelopathy or radiculopathy, lumbar region: Secondary | ICD-10-CM | POA: Diagnosis not present

## 2016-05-08 DIAGNOSIS — I10 Essential (primary) hypertension: Secondary | ICD-10-CM | POA: Diagnosis not present

## 2016-05-08 DIAGNOSIS — E119 Type 2 diabetes mellitus without complications: Secondary | ICD-10-CM | POA: Diagnosis not present

## 2016-05-08 DIAGNOSIS — M48061 Spinal stenosis, lumbar region without neurogenic claudication: Secondary | ICD-10-CM | POA: Diagnosis not present

## 2016-05-08 DIAGNOSIS — E7800 Pure hypercholesterolemia, unspecified: Secondary | ICD-10-CM | POA: Diagnosis not present

## 2016-05-08 DIAGNOSIS — L89522 Pressure ulcer of left ankle, stage 2: Secondary | ICD-10-CM | POA: Diagnosis not present

## 2016-05-12 DIAGNOSIS — E7800 Pure hypercholesterolemia, unspecified: Secondary | ICD-10-CM | POA: Diagnosis not present

## 2016-05-12 DIAGNOSIS — L89522 Pressure ulcer of left ankle, stage 2: Secondary | ICD-10-CM | POA: Diagnosis not present

## 2016-05-12 DIAGNOSIS — I10 Essential (primary) hypertension: Secondary | ICD-10-CM | POA: Diagnosis not present

## 2016-05-12 DIAGNOSIS — M47816 Spondylosis without myelopathy or radiculopathy, lumbar region: Secondary | ICD-10-CM | POA: Diagnosis not present

## 2016-05-12 DIAGNOSIS — E119 Type 2 diabetes mellitus without complications: Secondary | ICD-10-CM | POA: Diagnosis not present

## 2016-05-12 DIAGNOSIS — M48061 Spinal stenosis, lumbar region without neurogenic claudication: Secondary | ICD-10-CM | POA: Diagnosis not present

## 2016-05-13 DIAGNOSIS — M48061 Spinal stenosis, lumbar region without neurogenic claudication: Secondary | ICD-10-CM | POA: Diagnosis not present

## 2016-05-13 DIAGNOSIS — M47816 Spondylosis without myelopathy or radiculopathy, lumbar region: Secondary | ICD-10-CM | POA: Diagnosis not present

## 2016-05-13 DIAGNOSIS — I10 Essential (primary) hypertension: Secondary | ICD-10-CM | POA: Diagnosis not present

## 2016-05-13 DIAGNOSIS — L89522 Pressure ulcer of left ankle, stage 2: Secondary | ICD-10-CM | POA: Diagnosis not present

## 2016-05-13 DIAGNOSIS — E119 Type 2 diabetes mellitus without complications: Secondary | ICD-10-CM | POA: Diagnosis not present

## 2016-05-13 DIAGNOSIS — E7800 Pure hypercholesterolemia, unspecified: Secondary | ICD-10-CM | POA: Diagnosis not present

## 2016-05-15 DIAGNOSIS — M47816 Spondylosis without myelopathy or radiculopathy, lumbar region: Secondary | ICD-10-CM | POA: Diagnosis not present

## 2016-05-15 DIAGNOSIS — I10 Essential (primary) hypertension: Secondary | ICD-10-CM | POA: Diagnosis not present

## 2016-05-15 DIAGNOSIS — L89522 Pressure ulcer of left ankle, stage 2: Secondary | ICD-10-CM | POA: Diagnosis not present

## 2016-05-15 DIAGNOSIS — E119 Type 2 diabetes mellitus without complications: Secondary | ICD-10-CM | POA: Diagnosis not present

## 2016-05-15 DIAGNOSIS — M48061 Spinal stenosis, lumbar region without neurogenic claudication: Secondary | ICD-10-CM | POA: Diagnosis not present

## 2016-05-15 DIAGNOSIS — E7800 Pure hypercholesterolemia, unspecified: Secondary | ICD-10-CM | POA: Diagnosis not present

## 2016-05-18 DIAGNOSIS — I10 Essential (primary) hypertension: Secondary | ICD-10-CM | POA: Diagnosis not present

## 2016-05-18 DIAGNOSIS — E119 Type 2 diabetes mellitus without complications: Secondary | ICD-10-CM | POA: Diagnosis not present

## 2016-05-18 DIAGNOSIS — L89522 Pressure ulcer of left ankle, stage 2: Secondary | ICD-10-CM | POA: Diagnosis not present

## 2016-05-18 DIAGNOSIS — E7800 Pure hypercholesterolemia, unspecified: Secondary | ICD-10-CM | POA: Diagnosis not present

## 2016-05-18 DIAGNOSIS — M47816 Spondylosis without myelopathy or radiculopathy, lumbar region: Secondary | ICD-10-CM | POA: Diagnosis not present

## 2016-05-18 DIAGNOSIS — M48061 Spinal stenosis, lumbar region without neurogenic claudication: Secondary | ICD-10-CM | POA: Diagnosis not present

## 2016-05-19 ENCOUNTER — Encounter: Payer: Self-pay | Admitting: Family Medicine

## 2016-05-19 ENCOUNTER — Ambulatory Visit (INDEPENDENT_AMBULATORY_CARE_PROVIDER_SITE_OTHER): Payer: Medicare Other | Admitting: Family Medicine

## 2016-05-19 VITALS — BP 140/88 | Ht 74.0 in | Wt 286.0 lb

## 2016-05-19 DIAGNOSIS — I10 Essential (primary) hypertension: Secondary | ICD-10-CM

## 2016-05-19 DIAGNOSIS — I2510 Atherosclerotic heart disease of native coronary artery without angina pectoris: Secondary | ICD-10-CM

## 2016-05-19 DIAGNOSIS — M5441 Lumbago with sciatica, right side: Secondary | ICD-10-CM

## 2016-05-19 DIAGNOSIS — F324 Major depressive disorder, single episode, in partial remission: Secondary | ICD-10-CM

## 2016-05-19 DIAGNOSIS — E1310 Other specified diabetes mellitus with ketoacidosis without coma: Secondary | ICD-10-CM

## 2016-05-19 DIAGNOSIS — G8929 Other chronic pain: Secondary | ICD-10-CM

## 2016-05-19 LAB — POCT GLYCOSYLATED HEMOGLOBIN (HGB A1C): Hemoglobin A1C: 5.9

## 2016-05-19 MED ORDER — EMPAGLIFLOZIN 25 MG PO TABS: 25.0000 mg | ORAL_TABLET | Freq: Every day | ORAL | 0 refills | Status: DC

## 2016-05-19 NOTE — Progress Notes (Signed)
   Subjective:    Patient ID: Francisco Powell, male    DOB: Mar 26, 1941, 75 y.o.   MRN: [id number]  [facility]   non concerns.  Comprehensive evaluation of the patient was completed today I reviewed over his sugar readings. Also reviewed over his medication list. We also discussed his pain medicati Patient also is now starting to walk. Face-to-face evaluation was done othe patient for the purpose of ongoing physical therapy. The patient would benefit from physical therapy. It is a hardship for him to leave the home. Patient's depression seems to be going well Patients heart condition diabetes overall seems stable according to family. No recent infections or setbacks.patient also relates compliance with taking blood pressure medicine.patient did not is low sugar spells Pt brought in blood sugar reading. A1C today.   25 minutes was spent with the patient. Greater than half the time was spent in discussion and answering questions and counseling regarding the issues that the patient came in for today.   Review of Systems  Constitutional: Negative for activity change, appetite change and fatigue.  HENT: Negative for congestion.   Respiratory: Negative for cough.   Cardiovascular: Negative for chest pain.  Gastrointestinal: Negative for abdominal pain.  Endocrine: Negative for polydipsia and polyphagia.  Musculoskeletal: Positive for arthralgias, back pain and gait problem.  Neurological: Negative for weakness and headaches.  Psychiatric/Behavioral: Negative for behavioral problems and confusion.       Objective:   Physical Exam  Constitutional: He appears well-nourished. No distress.  Cardiovascular: Normal rate, regular rhythm and normal heart sounds.   No murmur heard. Pulmonary/Chest: Effort normal and breath sounds normal. No respiratory distress.  Abdominal: Soft. He exhibits no distension.  Musculoskeletal: He exhibits no edema.  Lymphadenopathy:    He has no  cervical adenopathy.  Neurological: He is alert.  Skin: Skin is warm and dry.  Psychiatric: His behavior is normal.  Vitals reviewed.         Assessment & Plan:  1. Uncontrolled type 2 diabetes mellitus with ketoacidosis without coma, unspecified long term insulin use status ([place]) His diabetes is actually under surprisingly good control we adjusted his long-acting insulin to 55 units twicdailyin order to try to get his numbers looked betternow that he is at home he is more than likely to eat more of a in rich diet - POCT glycosylated hemoglobin (Hb A1C)  2. Essential hypertension Blood pressure under good control slightly up today we will monitor continue as is  3. Chronic midline low back pain with right-sided sciatica Pain medication sparingly continue physical therapy, patient does not need prescription for pain medicine currently  4. Major depressive disorder with single episode, in partial remission ([place]) Depression under decent control with medication continue medication  Recheck patient in 4 months sooner problems

## 2016-05-20 ENCOUNTER — Telehealth: Payer: Self-pay | Admitting: Family Medicine

## 2016-05-20 DIAGNOSIS — E7800 Pure hypercholesterolemia, unspecified: Secondary | ICD-10-CM | POA: Diagnosis not present

## 2016-05-20 DIAGNOSIS — M47816 Spondylosis without myelopathy or radiculopathy, lumbar region: Secondary | ICD-10-CM | POA: Diagnosis not present

## 2016-05-20 DIAGNOSIS — E119 Type 2 diabetes mellitus without complications: Secondary | ICD-10-CM | POA: Diagnosis not present

## 2016-05-20 DIAGNOSIS — I10 Essential (primary) hypertension: Secondary | ICD-10-CM | POA: Diagnosis not present

## 2016-05-20 DIAGNOSIS — M48061 Spinal stenosis, lumbar region without neurogenic claudication: Secondary | ICD-10-CM | POA: Diagnosis not present

## 2016-05-20 DIAGNOSIS — L89522 Pressure ulcer of left ankle, stage 2: Secondary | ICD-10-CM | POA: Diagnosis not present

## 2016-05-20 MED ORDER — DAPAGLIFLOZIN PROPANEDIOL 10 MG PO TABS: 10.0000 mg | ORAL_TABLET | Freq: Every day | ORAL | 5 refills | Status: DC

## 2016-05-20 NOTE — Telephone Encounter (Signed)
Wife notified that med was sent to pharmacy.

## 2016-05-20 NOTE — Telephone Encounter (Signed)
Left message return call 05/20/16

## 2016-05-20 NOTE — Telephone Encounter (Signed)
Left message return call 05/20/16. New medication sent into pharmacy.

## 2016-05-20 NOTE — Telephone Encounter (Signed)
Cancel Jardiance, I recommend Farxiga 10 mg 1 daily, #30, 5 refills, please make sure pharmacy let the family know that this medicine replaces Invokana

## 2016-05-20 NOTE — Progress Notes (Signed)
Patient had flu shot on 03/20/16

## 2016-05-20 NOTE — Telephone Encounter (Signed)
Received prior authorization for Jardiance that was prescribed on yesterday. Medication is a non formulary. Please see formulary in Red folder in office with Alternatives to medication.

## 2016-05-20 NOTE — Telephone Encounter (Signed)
Please give verbal order.

## 2016-05-20 NOTE — Telephone Encounter (Signed)
(  Marie) from [place] needing a viral order to continue serves for patient. [phone]

## 2016-05-20 NOTE — Telephone Encounter (Signed)
Notified Verdis Frederickson that she can have verbal order.

## 2016-05-21 DIAGNOSIS — I10 Essential (primary) hypertension: Secondary | ICD-10-CM | POA: Diagnosis not present

## 2016-05-21 DIAGNOSIS — E119 Type 2 diabetes mellitus without complications: Secondary | ICD-10-CM | POA: Diagnosis not present

## 2016-05-21 DIAGNOSIS — E7800 Pure hypercholesterolemia, unspecified: Secondary | ICD-10-CM | POA: Diagnosis not present

## 2016-05-21 DIAGNOSIS — L89522 Pressure ulcer of left ankle, stage 2: Secondary | ICD-10-CM | POA: Diagnosis not present

## 2016-05-21 DIAGNOSIS — M47816 Spondylosis without myelopathy or radiculopathy, lumbar region: Secondary | ICD-10-CM | POA: Diagnosis not present

## 2016-05-21 DIAGNOSIS — M48061 Spinal stenosis, lumbar region without neurogenic claudication: Secondary | ICD-10-CM | POA: Diagnosis not present

## 2016-05-22 DIAGNOSIS — E7800 Pure hypercholesterolemia, unspecified: Secondary | ICD-10-CM | POA: Diagnosis not present

## 2016-05-22 DIAGNOSIS — I10 Essential (primary) hypertension: Secondary | ICD-10-CM | POA: Diagnosis not present

## 2016-05-22 DIAGNOSIS — Z9181 History of falling: Secondary | ICD-10-CM | POA: Diagnosis not present

## 2016-05-22 DIAGNOSIS — Z7984 Long term (current) use of oral hypoglycemic drugs: Secondary | ICD-10-CM | POA: Diagnosis not present

## 2016-05-22 DIAGNOSIS — I252 Old myocardial infarction: Secondary | ICD-10-CM | POA: Diagnosis not present

## 2016-05-22 DIAGNOSIS — Z794 Long term (current) use of insulin: Secondary | ICD-10-CM | POA: Diagnosis not present

## 2016-05-22 DIAGNOSIS — Z7982 Long term (current) use of aspirin: Secondary | ICD-10-CM | POA: Diagnosis not present

## 2016-05-22 DIAGNOSIS — E119 Type 2 diabetes mellitus without complications: Secondary | ICD-10-CM | POA: Diagnosis not present

## 2016-05-22 DIAGNOSIS — M48061 Spinal stenosis, lumbar region without neurogenic claudication: Secondary | ICD-10-CM | POA: Diagnosis not present

## 2016-05-22 DIAGNOSIS — M47816 Spondylosis without myelopathy or radiculopathy, lumbar region: Secondary | ICD-10-CM | POA: Diagnosis not present

## 2016-05-25 DIAGNOSIS — E7800 Pure hypercholesterolemia, unspecified: Secondary | ICD-10-CM | POA: Diagnosis not present

## 2016-05-25 DIAGNOSIS — M47816 Spondylosis without myelopathy or radiculopathy, lumbar region: Secondary | ICD-10-CM | POA: Diagnosis not present

## 2016-05-25 DIAGNOSIS — I10 Essential (primary) hypertension: Secondary | ICD-10-CM | POA: Diagnosis not present

## 2016-05-25 DIAGNOSIS — E119 Type 2 diabetes mellitus without complications: Secondary | ICD-10-CM | POA: Diagnosis not present

## 2016-05-25 DIAGNOSIS — M48061 Spinal stenosis, lumbar region without neurogenic claudication: Secondary | ICD-10-CM | POA: Diagnosis not present

## 2016-05-25 DIAGNOSIS — I252 Old myocardial infarction: Secondary | ICD-10-CM | POA: Diagnosis not present

## 2016-05-26 DIAGNOSIS — E7800 Pure hypercholesterolemia, unspecified: Secondary | ICD-10-CM | POA: Diagnosis not present

## 2016-05-26 DIAGNOSIS — I10 Essential (primary) hypertension: Secondary | ICD-10-CM | POA: Diagnosis not present

## 2016-05-26 DIAGNOSIS — M48061 Spinal stenosis, lumbar region without neurogenic claudication: Secondary | ICD-10-CM | POA: Diagnosis not present

## 2016-05-26 DIAGNOSIS — E119 Type 2 diabetes mellitus without complications: Secondary | ICD-10-CM | POA: Diagnosis not present

## 2016-05-26 DIAGNOSIS — M47816 Spondylosis without myelopathy or radiculopathy, lumbar region: Secondary | ICD-10-CM | POA: Diagnosis not present

## 2016-05-26 DIAGNOSIS — I252 Old myocardial infarction: Secondary | ICD-10-CM | POA: Diagnosis not present

## 2016-05-27 DIAGNOSIS — E119 Type 2 diabetes mellitus without complications: Secondary | ICD-10-CM | POA: Diagnosis not present

## 2016-05-27 DIAGNOSIS — I252 Old myocardial infarction: Secondary | ICD-10-CM | POA: Diagnosis not present

## 2016-05-27 DIAGNOSIS — M47816 Spondylosis without myelopathy or radiculopathy, lumbar region: Secondary | ICD-10-CM | POA: Diagnosis not present

## 2016-05-27 DIAGNOSIS — M48061 Spinal stenosis, lumbar region without neurogenic claudication: Secondary | ICD-10-CM | POA: Diagnosis not present

## 2016-05-27 DIAGNOSIS — I10 Essential (primary) hypertension: Secondary | ICD-10-CM | POA: Diagnosis not present

## 2016-05-27 DIAGNOSIS — E7800 Pure hypercholesterolemia, unspecified: Secondary | ICD-10-CM | POA: Diagnosis not present

## 2016-05-28 DIAGNOSIS — E119 Type 2 diabetes mellitus without complications: Secondary | ICD-10-CM | POA: Diagnosis not present

## 2016-05-28 DIAGNOSIS — M47816 Spondylosis without myelopathy or radiculopathy, lumbar region: Secondary | ICD-10-CM | POA: Diagnosis not present

## 2016-05-28 DIAGNOSIS — I10 Essential (primary) hypertension: Secondary | ICD-10-CM | POA: Diagnosis not present

## 2016-05-28 DIAGNOSIS — I252 Old myocardial infarction: Secondary | ICD-10-CM | POA: Diagnosis not present

## 2016-05-28 DIAGNOSIS — E7800 Pure hypercholesterolemia, unspecified: Secondary | ICD-10-CM | POA: Diagnosis not present

## 2016-05-28 DIAGNOSIS — M48061 Spinal stenosis, lumbar region without neurogenic claudication: Secondary | ICD-10-CM | POA: Diagnosis not present

## 2016-05-29 DIAGNOSIS — E7800 Pure hypercholesterolemia, unspecified: Secondary | ICD-10-CM | POA: Diagnosis not present

## 2016-05-29 DIAGNOSIS — E119 Type 2 diabetes mellitus without complications: Secondary | ICD-10-CM | POA: Diagnosis not present

## 2016-05-29 DIAGNOSIS — I252 Old myocardial infarction: Secondary | ICD-10-CM | POA: Diagnosis not present

## 2016-05-29 DIAGNOSIS — I10 Essential (primary) hypertension: Secondary | ICD-10-CM | POA: Diagnosis not present

## 2016-05-29 DIAGNOSIS — M47816 Spondylosis without myelopathy or radiculopathy, lumbar region: Secondary | ICD-10-CM | POA: Diagnosis not present

## 2016-05-29 DIAGNOSIS — M48061 Spinal stenosis, lumbar region without neurogenic claudication: Secondary | ICD-10-CM | POA: Diagnosis not present

## 2016-06-01 ENCOUNTER — Telehealth: Payer: Self-pay | Admitting: Family Medicine

## 2016-06-01 DIAGNOSIS — E7800 Pure hypercholesterolemia, unspecified: Secondary | ICD-10-CM | POA: Diagnosis not present

## 2016-06-01 DIAGNOSIS — M48061 Spinal stenosis, lumbar region without neurogenic claudication: Secondary | ICD-10-CM | POA: Diagnosis not present

## 2016-06-01 DIAGNOSIS — E119 Type 2 diabetes mellitus without complications: Secondary | ICD-10-CM | POA: Diagnosis not present

## 2016-06-01 DIAGNOSIS — I252 Old myocardial infarction: Secondary | ICD-10-CM | POA: Diagnosis not present

## 2016-06-01 DIAGNOSIS — I10 Essential (primary) hypertension: Secondary | ICD-10-CM | POA: Diagnosis not present

## 2016-06-01 DIAGNOSIS — M47816 Spondylosis without myelopathy or radiculopathy, lumbar region: Secondary | ICD-10-CM | POA: Diagnosis not present

## 2016-06-01 NOTE — Telephone Encounter (Signed)
If he has ongoing trouble with the right knee I would recommend x-ray. It would be fine to give it another 2-3 days then let us know.

## 2016-06-01 NOTE — Telephone Encounter (Signed)
Tressia Danas, PT at [facility], wanted to let Dr. Nicki Reaper know that patient had a fall on this past Saturday.  He fell to his knees and he is having quite a bit of pain in his right knee and swelling.  He is able to tolerate moderate exercise.  They are elevating and icing.  It seems to be soft tissue, but that is just what Suezanne Jacquet can see.  Overall, he has been doing well with mobility and walking short distances.

## 2016-06-01 NOTE — Telephone Encounter (Signed)
Particia Lather at home health pt-Dr Nicki Reaper recommends If patient has ongoing trouble with the right knee Dr Nicki Reaper would recommend x-ray. It would be fine to give it another 2-3 days then let us know. Physical therapist verbalized understanding and advised patient.

## 2016-06-02 DIAGNOSIS — I10 Essential (primary) hypertension: Secondary | ICD-10-CM | POA: Diagnosis not present

## 2016-06-02 DIAGNOSIS — I252 Old myocardial infarction: Secondary | ICD-10-CM | POA: Diagnosis not present

## 2016-06-02 DIAGNOSIS — M47816 Spondylosis without myelopathy or radiculopathy, lumbar region: Secondary | ICD-10-CM | POA: Diagnosis not present

## 2016-06-02 DIAGNOSIS — E119 Type 2 diabetes mellitus without complications: Secondary | ICD-10-CM | POA: Diagnosis not present

## 2016-06-02 DIAGNOSIS — E7800 Pure hypercholesterolemia, unspecified: Secondary | ICD-10-CM | POA: Diagnosis not present

## 2016-06-02 DIAGNOSIS — M48061 Spinal stenosis, lumbar region without neurogenic claudication: Secondary | ICD-10-CM | POA: Diagnosis not present

## 2016-06-03 DIAGNOSIS — E7800 Pure hypercholesterolemia, unspecified: Secondary | ICD-10-CM | POA: Diagnosis not present

## 2016-06-03 DIAGNOSIS — I252 Old myocardial infarction: Secondary | ICD-10-CM | POA: Diagnosis not present

## 2016-06-03 DIAGNOSIS — M47816 Spondylosis without myelopathy or radiculopathy, lumbar region: Secondary | ICD-10-CM | POA: Diagnosis not present

## 2016-06-03 DIAGNOSIS — I10 Essential (primary) hypertension: Secondary | ICD-10-CM | POA: Diagnosis not present

## 2016-06-03 DIAGNOSIS — M48061 Spinal stenosis, lumbar region without neurogenic claudication: Secondary | ICD-10-CM | POA: Diagnosis not present

## 2016-06-03 DIAGNOSIS — E119 Type 2 diabetes mellitus without complications: Secondary | ICD-10-CM | POA: Diagnosis not present

## 2016-06-04 ENCOUNTER — Telehealth: Payer: Self-pay | Admitting: Family Medicine

## 2016-06-04 ENCOUNTER — Telehealth: Payer: Self-pay | Admitting: Pediatrics

## 2016-06-04 DIAGNOSIS — I252 Old myocardial infarction: Secondary | ICD-10-CM | POA: Diagnosis not present

## 2016-06-04 DIAGNOSIS — E119 Type 2 diabetes mellitus without complications: Secondary | ICD-10-CM | POA: Diagnosis not present

## 2016-06-04 DIAGNOSIS — E7800 Pure hypercholesterolemia, unspecified: Secondary | ICD-10-CM | POA: Diagnosis not present

## 2016-06-04 DIAGNOSIS — I10 Essential (primary) hypertension: Secondary | ICD-10-CM | POA: Diagnosis not present

## 2016-06-04 DIAGNOSIS — M47816 Spondylosis without myelopathy or radiculopathy, lumbar region: Secondary | ICD-10-CM | POA: Diagnosis not present

## 2016-06-04 DIAGNOSIS — M48061 Spinal stenosis, lumbar region without neurogenic claudication: Secondary | ICD-10-CM | POA: Diagnosis not present

## 2016-06-04 MED ORDER — DULOXETINE HCL 60 MG PO CPEP: 60.0000 mg | ORAL_CAPSULE | Freq: Every day | ORAL | 11 refills | Status: DC

## 2016-06-04 MED ORDER — ALPRAZOLAM 1 MG PO TABS: ORAL_TABLET | ORAL | 1 refills | Status: DC

## 2016-06-04 NOTE — Telephone Encounter (Signed)
Patient is having trouble sleeping due to finding out his wife's new diagnosis.  Pam says that Dr. Wolfgang Phoenix is aware of what is going on and said that if anything was needed, we would be able to call something in to help him.  Please advise.   [facility]

## 2016-06-04 NOTE — Telephone Encounter (Signed)
Prescription faxed to pharmacy. Patient notified. 

## 2016-06-04 NOTE — Telephone Encounter (Signed)
Xanax 1.0 mg one half to one qhs prn sleep numb 24, one ref

## 2016-06-04 NOTE — Telephone Encounter (Signed)
Pharmacy faxed refill requests on Carvedilol, Cymbalta, and Metformin.    The rx request for Carvedilol was 25 mg BID, med list states 12.5 mg BID, which strength would you like?    I noticed you switched patient from [place] to [place] because of insurance.  When he was discharged from [facility] he was taking Metformin and Farxiga (per [place] at [facility]).  Would you like to continue Metformin?  I refilled Cymbalta.

## 2016-06-09 ENCOUNTER — Other Ambulatory Visit: Payer: Self-pay | Admitting: Family Medicine

## 2016-06-09 DIAGNOSIS — M47816 Spondylosis without myelopathy or radiculopathy, lumbar region: Secondary | ICD-10-CM | POA: Diagnosis not present

## 2016-06-09 DIAGNOSIS — M48061 Spinal stenosis, lumbar region without neurogenic claudication: Secondary | ICD-10-CM | POA: Diagnosis not present

## 2016-06-09 DIAGNOSIS — I10 Essential (primary) hypertension: Secondary | ICD-10-CM | POA: Diagnosis not present

## 2016-06-09 DIAGNOSIS — E119 Type 2 diabetes mellitus without complications: Secondary | ICD-10-CM | POA: Diagnosis not present

## 2016-06-09 DIAGNOSIS — I252 Old myocardial infarction: Secondary | ICD-10-CM | POA: Diagnosis not present

## 2016-06-09 DIAGNOSIS — E7800 Pure hypercholesterolemia, unspecified: Secondary | ICD-10-CM | POA: Diagnosis not present

## 2016-06-10 ENCOUNTER — Other Ambulatory Visit: Payer: Self-pay | Admitting: Family Medicine

## 2016-06-10 DIAGNOSIS — E119 Type 2 diabetes mellitus without complications: Secondary | ICD-10-CM | POA: Diagnosis not present

## 2016-06-10 DIAGNOSIS — I10 Essential (primary) hypertension: Secondary | ICD-10-CM | POA: Diagnosis not present

## 2016-06-10 DIAGNOSIS — M48061 Spinal stenosis, lumbar region without neurogenic claudication: Secondary | ICD-10-CM | POA: Diagnosis not present

## 2016-06-10 DIAGNOSIS — E7800 Pure hypercholesterolemia, unspecified: Secondary | ICD-10-CM | POA: Diagnosis not present

## 2016-06-10 DIAGNOSIS — I252 Old myocardial infarction: Secondary | ICD-10-CM | POA: Diagnosis not present

## 2016-06-10 DIAGNOSIS — M47816 Spondylosis without myelopathy or radiculopathy, lumbar region: Secondary | ICD-10-CM | POA: Diagnosis not present

## 2016-06-10 NOTE — Telephone Encounter (Signed)
The initial refill request needed dosage clarification. It was routed to Dr. Richardson Landry and Dr. Nicki Reaper, routed back to me, and closed by Dr. Richardson Landry.  I created an addendum and re-routed the clarification to Dr. Nicki Reaper.

## 2016-06-10 NOTE — Telephone Encounter (Signed)
This note was closed by a provider, then routed back to me.  I am rerouting to Dr. Nicki Reaper for followup.

## 2016-06-10 NOTE — Telephone Encounter (Signed)
This was routed to physician for clarification. Duplicate

## 2016-06-10 NOTE — Telephone Encounter (Signed)
Metformin may be continued. As for the [facility] dialogue I would recommend talking with the patient's family find out the specific dose patient is currently using. According to the last cardiologist know in the spring as well as even are refills it was 12.5 twice a day therefore 25 twice a day seems wrong. Let's verify if it is 12.5 go ahead with 6 months refill if the family states it's 25 mg twice a day please discuss with me thank you

## 2016-06-10 NOTE — Telephone Encounter (Signed)
This dose does not correlate with what was stated in the cardiologist note from spring time as well as our previous notes. It must be a previous dose. I recommend confirming the dose with the patient's family. More than likely he is on 12.5 mg twice a day and this is a request from pharmacy based on previous information. Therefore needs to be verified with patient then the correct dose reordered for 6 months and the pharmacy needs to know the correct dose as well as elimination of the faulty dose. Obviously if there is confusion please let me know.

## 2016-06-11 NOTE — Telephone Encounter (Signed)
It is fine to refill this time 6 please make sure that Epic reflects that this is the dose he is taking

## 2016-06-11 NOTE — Telephone Encounter (Signed)
Wife states he has been taking 25mg  one bid.

## 2016-06-11 NOTE — Telephone Encounter (Signed)
Left message on voicemail to return call.

## 2016-06-12 DIAGNOSIS — I10 Essential (primary) hypertension: Secondary | ICD-10-CM | POA: Diagnosis not present

## 2016-06-12 DIAGNOSIS — I252 Old myocardial infarction: Secondary | ICD-10-CM | POA: Diagnosis not present

## 2016-06-12 DIAGNOSIS — E7800 Pure hypercholesterolemia, unspecified: Secondary | ICD-10-CM | POA: Diagnosis not present

## 2016-06-12 DIAGNOSIS — M48061 Spinal stenosis, lumbar region without neurogenic claudication: Secondary | ICD-10-CM | POA: Diagnosis not present

## 2016-06-12 DIAGNOSIS — M47816 Spondylosis without myelopathy or radiculopathy, lumbar region: Secondary | ICD-10-CM | POA: Diagnosis not present

## 2016-06-12 DIAGNOSIS — E119 Type 2 diabetes mellitus without complications: Secondary | ICD-10-CM | POA: Diagnosis not present

## 2016-06-16 ENCOUNTER — Telehealth: Payer: Self-pay | Admitting: Family Medicine

## 2016-06-16 DIAGNOSIS — I252 Old myocardial infarction: Secondary | ICD-10-CM | POA: Diagnosis not present

## 2016-06-16 DIAGNOSIS — E7800 Pure hypercholesterolemia, unspecified: Secondary | ICD-10-CM | POA: Diagnosis not present

## 2016-06-16 DIAGNOSIS — M48061 Spinal stenosis, lumbar region without neurogenic claudication: Secondary | ICD-10-CM | POA: Diagnosis not present

## 2016-06-16 DIAGNOSIS — M47816 Spondylosis without myelopathy or radiculopathy, lumbar region: Secondary | ICD-10-CM | POA: Diagnosis not present

## 2016-06-16 DIAGNOSIS — E119 Type 2 diabetes mellitus without complications: Secondary | ICD-10-CM | POA: Diagnosis not present

## 2016-06-16 DIAGNOSIS — I10 Essential (primary) hypertension: Secondary | ICD-10-CM | POA: Diagnosis not present

## 2016-06-16 NOTE — Telephone Encounter (Signed)
Please give notice that they may continue occupational therapy

## 2016-06-16 NOTE — Telephone Encounter (Signed)
Notified Kat to continue OT

## 2016-06-16 NOTE — Telephone Encounter (Signed)
Notified Pam script will be faxed to pharmacy today.

## 2016-06-16 NOTE — Telephone Encounter (Signed)
Pt is needing a new glucose machine as well as strips and lancets. Pt's previous machine is giving incorrect readings.    [facility]

## 2016-06-16 NOTE — Telephone Encounter (Signed)
[facility] occupational therapist is needing verbal orders to continue OT two times a week for 3 weeks.

## 2016-06-17 DIAGNOSIS — M47816 Spondylosis without myelopathy or radiculopathy, lumbar region: Secondary | ICD-10-CM | POA: Diagnosis not present

## 2016-06-17 DIAGNOSIS — E119 Type 2 diabetes mellitus without complications: Secondary | ICD-10-CM | POA: Diagnosis not present

## 2016-06-17 DIAGNOSIS — I252 Old myocardial infarction: Secondary | ICD-10-CM | POA: Diagnosis not present

## 2016-06-17 DIAGNOSIS — E7800 Pure hypercholesterolemia, unspecified: Secondary | ICD-10-CM | POA: Diagnosis not present

## 2016-06-17 DIAGNOSIS — M48061 Spinal stenosis, lumbar region without neurogenic claudication: Secondary | ICD-10-CM | POA: Diagnosis not present

## 2016-06-17 DIAGNOSIS — I10 Essential (primary) hypertension: Secondary | ICD-10-CM | POA: Diagnosis not present

## 2016-06-17 NOTE — Telephone Encounter (Signed)
Spoke with son.  He is aware of instructions on Metformin and Carvedilol.    Is also asking if a handicap placard application can be filled out.  He will bring this by the office.

## 2016-06-18 DIAGNOSIS — I10 Essential (primary) hypertension: Secondary | ICD-10-CM | POA: Diagnosis not present

## 2016-06-18 DIAGNOSIS — I252 Old myocardial infarction: Secondary | ICD-10-CM | POA: Diagnosis not present

## 2016-06-18 DIAGNOSIS — M47816 Spondylosis without myelopathy or radiculopathy, lumbar region: Secondary | ICD-10-CM | POA: Diagnosis not present

## 2016-06-18 DIAGNOSIS — E119 Type 2 diabetes mellitus without complications: Secondary | ICD-10-CM | POA: Diagnosis not present

## 2016-06-18 DIAGNOSIS — M48061 Spinal stenosis, lumbar region without neurogenic claudication: Secondary | ICD-10-CM | POA: Diagnosis not present

## 2016-06-18 DIAGNOSIS — E7800 Pure hypercholesterolemia, unspecified: Secondary | ICD-10-CM | POA: Diagnosis not present

## 2016-06-19 DIAGNOSIS — I10 Essential (primary) hypertension: Secondary | ICD-10-CM | POA: Diagnosis not present

## 2016-06-19 DIAGNOSIS — E7800 Pure hypercholesterolemia, unspecified: Secondary | ICD-10-CM | POA: Diagnosis not present

## 2016-06-19 DIAGNOSIS — E119 Type 2 diabetes mellitus without complications: Secondary | ICD-10-CM | POA: Diagnosis not present

## 2016-06-19 DIAGNOSIS — M47816 Spondylosis without myelopathy or radiculopathy, lumbar region: Secondary | ICD-10-CM | POA: Diagnosis not present

## 2016-06-19 DIAGNOSIS — M48061 Spinal stenosis, lumbar region without neurogenic claudication: Secondary | ICD-10-CM | POA: Diagnosis not present

## 2016-06-19 DIAGNOSIS — I252 Old myocardial infarction: Secondary | ICD-10-CM | POA: Diagnosis not present

## 2016-06-22 DIAGNOSIS — M47816 Spondylosis without myelopathy or radiculopathy, lumbar region: Secondary | ICD-10-CM | POA: Diagnosis not present

## 2016-06-22 DIAGNOSIS — I10 Essential (primary) hypertension: Secondary | ICD-10-CM | POA: Diagnosis not present

## 2016-06-22 DIAGNOSIS — E119 Type 2 diabetes mellitus without complications: Secondary | ICD-10-CM | POA: Diagnosis not present

## 2016-06-22 DIAGNOSIS — M48061 Spinal stenosis, lumbar region without neurogenic claudication: Secondary | ICD-10-CM | POA: Diagnosis not present

## 2016-06-22 DIAGNOSIS — I252 Old myocardial infarction: Secondary | ICD-10-CM | POA: Diagnosis not present

## 2016-06-22 DIAGNOSIS — E7800 Pure hypercholesterolemia, unspecified: Secondary | ICD-10-CM | POA: Diagnosis not present

## 2016-06-23 DIAGNOSIS — M48061 Spinal stenosis, lumbar region without neurogenic claudication: Secondary | ICD-10-CM | POA: Diagnosis not present

## 2016-06-23 DIAGNOSIS — M47816 Spondylosis without myelopathy or radiculopathy, lumbar region: Secondary | ICD-10-CM | POA: Diagnosis not present

## 2016-06-23 DIAGNOSIS — I252 Old myocardial infarction: Secondary | ICD-10-CM | POA: Diagnosis not present

## 2016-06-23 DIAGNOSIS — I10 Essential (primary) hypertension: Secondary | ICD-10-CM | POA: Diagnosis not present

## 2016-06-23 DIAGNOSIS — E119 Type 2 diabetes mellitus without complications: Secondary | ICD-10-CM | POA: Diagnosis not present

## 2016-06-23 DIAGNOSIS — E7800 Pure hypercholesterolemia, unspecified: Secondary | ICD-10-CM | POA: Diagnosis not present

## 2016-06-24 DIAGNOSIS — I10 Essential (primary) hypertension: Secondary | ICD-10-CM | POA: Diagnosis not present

## 2016-06-24 DIAGNOSIS — I252 Old myocardial infarction: Secondary | ICD-10-CM | POA: Diagnosis not present

## 2016-06-24 DIAGNOSIS — M48061 Spinal stenosis, lumbar region without neurogenic claudication: Secondary | ICD-10-CM | POA: Diagnosis not present

## 2016-06-24 DIAGNOSIS — E7800 Pure hypercholesterolemia, unspecified: Secondary | ICD-10-CM | POA: Diagnosis not present

## 2016-06-24 DIAGNOSIS — E119 Type 2 diabetes mellitus without complications: Secondary | ICD-10-CM | POA: Diagnosis not present

## 2016-06-24 DIAGNOSIS — M47816 Spondylosis without myelopathy or radiculopathy, lumbar region: Secondary | ICD-10-CM | POA: Diagnosis not present

## 2016-06-25 DIAGNOSIS — M48061 Spinal stenosis, lumbar region without neurogenic claudication: Secondary | ICD-10-CM | POA: Diagnosis not present

## 2016-06-25 DIAGNOSIS — M47816 Spondylosis without myelopathy or radiculopathy, lumbar region: Secondary | ICD-10-CM | POA: Diagnosis not present

## 2016-06-25 DIAGNOSIS — I252 Old myocardial infarction: Secondary | ICD-10-CM | POA: Diagnosis not present

## 2016-06-25 DIAGNOSIS — E119 Type 2 diabetes mellitus without complications: Secondary | ICD-10-CM | POA: Diagnosis not present

## 2016-06-25 DIAGNOSIS — I10 Essential (primary) hypertension: Secondary | ICD-10-CM | POA: Diagnosis not present

## 2016-06-25 DIAGNOSIS — E7800 Pure hypercholesterolemia, unspecified: Secondary | ICD-10-CM | POA: Diagnosis not present

## 2016-06-30 DIAGNOSIS — I10 Essential (primary) hypertension: Secondary | ICD-10-CM | POA: Diagnosis not present

## 2016-06-30 DIAGNOSIS — I252 Old myocardial infarction: Secondary | ICD-10-CM | POA: Diagnosis not present

## 2016-06-30 DIAGNOSIS — E7800 Pure hypercholesterolemia, unspecified: Secondary | ICD-10-CM | POA: Diagnosis not present

## 2016-06-30 DIAGNOSIS — M48061 Spinal stenosis, lumbar region without neurogenic claudication: Secondary | ICD-10-CM | POA: Diagnosis not present

## 2016-06-30 DIAGNOSIS — E119 Type 2 diabetes mellitus without complications: Secondary | ICD-10-CM | POA: Diagnosis not present

## 2016-06-30 DIAGNOSIS — M47816 Spondylosis without myelopathy or radiculopathy, lumbar region: Secondary | ICD-10-CM | POA: Diagnosis not present

## 2016-07-02 DIAGNOSIS — I252 Old myocardial infarction: Secondary | ICD-10-CM | POA: Diagnosis not present

## 2016-07-02 DIAGNOSIS — M47816 Spondylosis without myelopathy or radiculopathy, lumbar region: Secondary | ICD-10-CM | POA: Diagnosis not present

## 2016-07-02 DIAGNOSIS — I10 Essential (primary) hypertension: Secondary | ICD-10-CM | POA: Diagnosis not present

## 2016-07-02 DIAGNOSIS — E119 Type 2 diabetes mellitus without complications: Secondary | ICD-10-CM | POA: Diagnosis not present

## 2016-07-02 DIAGNOSIS — E7800 Pure hypercholesterolemia, unspecified: Secondary | ICD-10-CM | POA: Diagnosis not present

## 2016-07-02 DIAGNOSIS — M48061 Spinal stenosis, lumbar region without neurogenic claudication: Secondary | ICD-10-CM | POA: Diagnosis not present

## 2016-07-10 DIAGNOSIS — E119 Type 2 diabetes mellitus without complications: Secondary | ICD-10-CM | POA: Diagnosis not present

## 2016-07-10 DIAGNOSIS — I252 Old myocardial infarction: Secondary | ICD-10-CM | POA: Diagnosis not present

## 2016-07-10 DIAGNOSIS — E7800 Pure hypercholesterolemia, unspecified: Secondary | ICD-10-CM | POA: Diagnosis not present

## 2016-07-10 DIAGNOSIS — M48061 Spinal stenosis, lumbar region without neurogenic claudication: Secondary | ICD-10-CM | POA: Diagnosis not present

## 2016-07-10 DIAGNOSIS — I10 Essential (primary) hypertension: Secondary | ICD-10-CM | POA: Diagnosis not present

## 2016-07-10 DIAGNOSIS — M47816 Spondylosis without myelopathy or radiculopathy, lumbar region: Secondary | ICD-10-CM | POA: Diagnosis not present

## 2016-07-16 DIAGNOSIS — E7800 Pure hypercholesterolemia, unspecified: Secondary | ICD-10-CM | POA: Diagnosis not present

## 2016-07-16 DIAGNOSIS — I10 Essential (primary) hypertension: Secondary | ICD-10-CM | POA: Diagnosis not present

## 2016-07-16 DIAGNOSIS — E119 Type 2 diabetes mellitus without complications: Secondary | ICD-10-CM | POA: Diagnosis not present

## 2016-07-16 DIAGNOSIS — M47816 Spondylosis without myelopathy or radiculopathy, lumbar region: Secondary | ICD-10-CM | POA: Diagnosis not present

## 2016-07-16 DIAGNOSIS — I252 Old myocardial infarction: Secondary | ICD-10-CM | POA: Diagnosis not present

## 2016-07-16 DIAGNOSIS — M48061 Spinal stenosis, lumbar region without neurogenic claudication: Secondary | ICD-10-CM | POA: Diagnosis not present

## 2016-07-20 DIAGNOSIS — E7800 Pure hypercholesterolemia, unspecified: Secondary | ICD-10-CM | POA: Diagnosis not present

## 2016-07-20 DIAGNOSIS — I10 Essential (primary) hypertension: Secondary | ICD-10-CM | POA: Diagnosis not present

## 2016-07-20 DIAGNOSIS — E119 Type 2 diabetes mellitus without complications: Secondary | ICD-10-CM | POA: Diagnosis not present

## 2016-07-20 DIAGNOSIS — M47816 Spondylosis without myelopathy or radiculopathy, lumbar region: Secondary | ICD-10-CM | POA: Diagnosis not present

## 2016-07-20 DIAGNOSIS — I252 Old myocardial infarction: Secondary | ICD-10-CM | POA: Diagnosis not present

## 2016-07-20 DIAGNOSIS — M48061 Spinal stenosis, lumbar region without neurogenic claudication: Secondary | ICD-10-CM | POA: Diagnosis not present

## 2016-08-19 ENCOUNTER — Other Ambulatory Visit: Payer: Self-pay | Admitting: Family Medicine

## 2016-08-26 ENCOUNTER — Telehealth: Payer: Self-pay

## 2016-08-26 MED ORDER — INSULIN DETEMIR 100 UNIT/ML ~~LOC~~ SOLN: 50.0000 [IU] | Freq: Every day | SUBCUTANEOUS | 2 refills | Status: DC

## 2016-08-26 NOTE — Telephone Encounter (Signed)
May use Levemir same units. Also please send patient noticed that he will need a office visit in the spring

## 2016-08-26 NOTE — Telephone Encounter (Signed)
Prescription sent electronically to pharmacy. Patient notified he needs office visit in spring.

## 2016-08-26 NOTE — Telephone Encounter (Signed)
Lantus 100UNIT/ML solution has been rejected by insurance.  44% The chance that your patient will receive their medication if you attempt a prior authorization. These medications below are more likely to be covered by this insurance plan.  Using [employer] [phone] published formulary we have determined that preferred agents for your patient are likely: Basaglar KwikPen T2  88%  Levemir T2-3  90%  Levemir FlexTouch T2-3  93%  Tresiba FlexTouch T2-3  85%   Proceed with PA or prescribe one of the covered medications?

## 2016-09-02 ENCOUNTER — Other Ambulatory Visit: Payer: Self-pay | Admitting: Family Medicine

## 2016-09-15 ENCOUNTER — Telehealth: Payer: Self-pay | Admitting: Family Medicine

## 2016-09-15 DIAGNOSIS — D649 Anemia, unspecified: Secondary | ICD-10-CM

## 2016-09-15 DIAGNOSIS — E119 Type 2 diabetes mellitus without complications: Secondary | ICD-10-CM

## 2016-09-15 DIAGNOSIS — E785 Hyperlipidemia, unspecified: Secondary | ICD-10-CM

## 2016-09-15 DIAGNOSIS — I10 Essential (primary) hypertension: Secondary | ICD-10-CM

## 2016-09-15 NOTE — Telephone Encounter (Signed)
Daughter called wanting to know when her Dad should come back in for an appointment. She keeps a track on his sugars and there has recently been a change in meds from Lantus to Levemir so she is just wondering if they are due for a visit. Still had some Lantus leftover so they haven't started the new medicine yet. Said Dr. Nicki Reaper recently visited the home when wife died.  He is getting around a lot better now and they could bring him in if needed but wanted to check to see what they should do or be doing. Daughter [phone]

## 2016-09-15 NOTE — Telephone Encounter (Signed)
I would recommend the patient follow-up with [place] in April no later than May. I would prefer for the patient to do lab work before the office visit including lipid, liver, metabolic 7, urine ACR, hemoglobin A1c, CBC-anemia, hyperlipidemia, hypertension, diabetes

## 2016-09-15 NOTE — Telephone Encounter (Signed)
Spoke with patient's daughter and informed her per Dr.Scott Luking- office visit no later than May. We have ordered labs to have drawn before appointment. Patient's daughter verbalized understanding.

## 2016-09-22 ENCOUNTER — Other Ambulatory Visit: Payer: Self-pay | Admitting: Family Medicine

## 2016-09-22 DIAGNOSIS — E119 Type 2 diabetes mellitus without complications: Secondary | ICD-10-CM | POA: Diagnosis not present

## 2016-09-22 DIAGNOSIS — E785 Hyperlipidemia, unspecified: Secondary | ICD-10-CM | POA: Diagnosis not present

## 2016-09-22 DIAGNOSIS — D649 Anemia, unspecified: Secondary | ICD-10-CM | POA: Diagnosis not present

## 2016-09-22 DIAGNOSIS — I10 Essential (primary) hypertension: Secondary | ICD-10-CM | POA: Diagnosis not present

## 2016-09-23 LAB — CBC WITH DIFFERENTIAL/PLATELET
BASOS ABS: 0.1 10*3/uL (ref 0.0–0.2)
BASOS: 1 %
EOS (ABSOLUTE): 0.1 10*3/uL (ref 0.0–0.4)
Eos: 1 %
HEMOGLOBIN: 14 g/dL (ref 13.0–17.7)
Hematocrit: 44.8 % (ref 37.5–51.0)
Immature Grans (Abs): 0.1 10*3/uL (ref 0.0–0.1)
Immature Granulocytes: 1 %
LYMPHS ABS: 2.3 10*3/uL (ref 0.7–3.1)
Lymphs: 21 %
MCH: 25.1 pg — AB (ref 26.6–33.0)
MCHC: 31.3 g/dL — AB (ref 31.5–35.7)
MCV: 80 fL (ref 79–97)
MONOCYTES: 10 %
Monocytes Absolute: 1 10*3/uL — ABNORMAL HIGH (ref 0.1–0.9)
Neutrophils Absolute: 7.2 10*3/uL — ABNORMAL HIGH (ref 1.4–7.0)
Neutrophils: 66 %
Platelets: 246 10*3/uL (ref 150–379)
RBC: 5.58 x10E6/uL (ref 4.14–5.80)
RDW: 18.8 % — ABNORMAL HIGH (ref 12.3–15.4)
WBC: 10.8 10*3/uL (ref 3.4–10.8)

## 2016-09-23 LAB — HEPATIC FUNCTION PANEL
ALK PHOS: 94 IU/L (ref 39–117)
ALT: 10 IU/L (ref 0–44)
AST: 17 IU/L (ref 0–40)
Albumin: 4.5 g/dL (ref 3.5–4.8)
Bilirubin Total: 0.3 mg/dL (ref 0.0–1.2)
Bilirubin, Direct: 0.11 mg/dL (ref 0.00–0.40)
Total Protein: 7.6 g/dL (ref 6.0–8.5)

## 2016-09-23 LAB — BASIC METABOLIC PANEL
BUN/Creatinine Ratio: 15 (ref 10–24)
BUN: 17 mg/dL (ref 8–27)
CO2: 20 mmol/L (ref 18–29)
Calcium: 9.9 mg/dL (ref 8.6–10.2)
Chloride: 104 mmol/L (ref 96–106)
Creatinine, Ser: 1.1 mg/dL (ref 0.76–1.27)
GFR calc Af Amer: 77 mL/min/{1.73_m2} (ref >59)
GFR calc non Af Amer: 66 mL/min/{1.73_m2} (ref >59)
GLUCOSE: 263 mg/dL — AB (ref 65–99)
POTASSIUM: 4.9 mmol/L (ref 3.5–5.2)
SODIUM: 145 mmol/L — AB (ref 134–144)

## 2016-09-23 LAB — HEMOGLOBIN A1C
ESTIMATED AVERAGE GLUCOSE: 177 mg/dL
HEMOGLOBIN A1C: 7.8 % — AB (ref 4.8–5.6)

## 2016-09-23 LAB — LIPID PANEL
CHOLESTEROL TOTAL: 194 mg/dL (ref 100–199)
Chol/HDL Ratio: 4.9 ratio (ref 0.0–5.0)
HDL: 40 mg/dL (ref >39)
LDL Calculated: 98 mg/dL (ref 0–99)
TRIGLYCERIDES: 278 mg/dL — AB (ref 0–149)
VLDL CHOLESTEROL CAL: 56 mg/dL — AB (ref 5–40)

## 2016-09-23 LAB — UNABLE TO VOID

## 2016-09-30 ENCOUNTER — Ambulatory Visit: Payer: Medicare Other | Admitting: Family Medicine

## 2016-10-01 ENCOUNTER — Ambulatory Visit (INDEPENDENT_AMBULATORY_CARE_PROVIDER_SITE_OTHER): Payer: Medicare Other | Admitting: Family Medicine

## 2016-10-01 ENCOUNTER — Encounter: Payer: Self-pay | Admitting: Family Medicine

## 2016-10-01 VITALS — BP 122/86 | Ht 74.0 in | Wt 286.0 lb

## 2016-10-01 DIAGNOSIS — E119 Type 2 diabetes mellitus without complications: Secondary | ICD-10-CM

## 2016-10-01 DIAGNOSIS — M5441 Lumbago with sciatica, right side: Secondary | ICD-10-CM | POA: Diagnosis not present

## 2016-10-01 DIAGNOSIS — I10 Essential (primary) hypertension: Secondary | ICD-10-CM

## 2016-10-01 DIAGNOSIS — E7849 Other hyperlipidemia: Secondary | ICD-10-CM

## 2016-10-01 DIAGNOSIS — F324 Major depressive disorder, single episode, in partial remission: Secondary | ICD-10-CM | POA: Diagnosis not present

## 2016-10-01 DIAGNOSIS — Z794 Long term (current) use of insulin: Secondary | ICD-10-CM | POA: Diagnosis not present

## 2016-10-01 DIAGNOSIS — E784 Other hyperlipidemia: Secondary | ICD-10-CM

## 2016-10-01 DIAGNOSIS — E1142 Type 2 diabetes mellitus with diabetic polyneuropathy: Secondary | ICD-10-CM | POA: Diagnosis not present

## 2016-10-01 DIAGNOSIS — G8929 Other chronic pain: Secondary | ICD-10-CM

## 2016-10-01 DIAGNOSIS — IMO0001 Reserved for inherently not codable concepts without codable children: Secondary | ICD-10-CM

## 2016-10-01 MED ORDER — INSULIN DETEMIR 100 UNIT/ML ~~LOC~~ SOLN
50.0000 [IU] | Freq: Two times a day (BID) | SUBCUTANEOUS | 1 refills | Status: DC
Start: 2016-10-01 — End: 2016-11-11

## 2016-10-01 MED ORDER — METFORMIN HCL 1000 MG PO TABS: 1000.0000 mg | ORAL_TABLET | Freq: Two times a day (BID) | ORAL | 1 refills | Status: DC

## 2016-10-01 MED ORDER — PRAVASTATIN SODIUM 80 MG PO TABS: 80.0000 mg | ORAL_TABLET | Freq: Every day | ORAL | 1 refills | Status: DC

## 2016-10-01 MED ORDER — LISINOPRIL 20 MG PO TABS: 20.0000 mg | ORAL_TABLET | Freq: Every day | ORAL | 1 refills | Status: DC

## 2016-10-01 MED ORDER — INSULIN ASPART 100 UNIT/ML FLEXPEN: PEN_INJECTOR | SUBCUTANEOUS | 1 refills | Status: DC

## 2016-10-01 NOTE — Progress Notes (Signed)
   Subjective:    Patient ID: Francisco Powell, male    DOB: 08/31/1940, 76 y.o.   MRN: [id number]  Diabetes  He presents for his follow-up diabetic visit. He has type 2 diabetes mellitus. Pertinent negatives for hypoglycemia include no confusion. Pertinent negatives for diabetes include no chest pain, no fatigue, no polydipsia, no polyphagia and no weakness. Risk factors for coronary artery disease include diabetes mellitus, dyslipidemia, hypertension, sedentary lifestyle and tobacco exposure. Current diabetic treatment includes insulin injections and oral agent (monotherapy). His weight is stable. He is following a diabetic diet.   Patient patient recently went through loss of wife Moderate grieving some depression Doing better now Partial remission He still has some times where he feels blue Takes his medicines as directed Tries the healthy Difficult to lose weight because of morbid obesity and physical impairments Has neuropathy in the feet from diabetes plus also nerve damage from surgery with some weakness intermittently in the right leg   Review of Systems  Constitutional: Negative for activity change, appetite change and fatigue.  HENT: Negative for congestion.   Respiratory: Negative for cough.   Cardiovascular: Negative for chest pain.  Gastrointestinal: Negative for abdominal pain.  Endocrine: Negative for polydipsia and polyphagia.  Neurological: Negative for weakness.  Psychiatric/Behavioral: Negative for confusion.       Objective:   Physical Exam  Constitutional: He appears well-nourished. No distress.  Cardiovascular: Normal rate, regular rhythm and normal heart sounds.   No murmur heard. Pulmonary/Chest: Effort normal and breath sounds normal. No respiratory distress.  Musculoskeletal: He exhibits no edema.  Lymphadenopathy:    He has no cervical adenopathy.  Neurological: He is alert.  Psychiatric: His behavior is normal.  Vitals reviewed.           Assessment & Plan:  Diabetes subpar control. Currently taking Levemir 50 units twice daily. Need to increase short acting insulin to each mealtime 25 units. In addition to this metformin currently taking 1000 mg twice a day  Moods overall doing fairly well. Continue antidepressant recent loss of wife  Blood pressure there is some orthostatic changes continue all medications but reduce lisinopril new dose 20 mg  Neuropathy in the feet. Warned to watch for any type of blisters in the feet  Hyperlipidemia subpar increase pravastatin 80 mg daily  Morbid obesity the importance of healthy diet regular physical activity recommended  Depression continue antidepressant

## 2016-10-12 ENCOUNTER — Telehealth: Payer: Self-pay | Admitting: Family Medicine

## 2016-10-12 NOTE — Telephone Encounter (Signed)
Daughter is wanting to know if the pt can get a prescription for the freestyle libre machine and three disks a month. Daughter states that the pt hates to be stuck and thinks that this may help. Please advise.

## 2016-10-13 NOTE — Telephone Encounter (Signed)
Spoke with patient's daughter and informed her per Dr.Scott Luking- would be fine to give them a prescription for this not sure if his insurance will cover it? Patient's daughter verbalized understanding and stated that they would still like to try it. Prescription to be fax to [facility].

## 2016-10-13 NOTE — Telephone Encounter (Signed)
It would be fine to give them a prescription for this (not sure if his insurance will cover it?) Please talk with the daughter help her out accordingly

## 2016-10-15 ENCOUNTER — Telehealth: Payer: Self-pay | Admitting: Family Medicine

## 2016-10-15 NOTE — Telephone Encounter (Signed)
Patient had Rx for freestyle libre machine sent to [facility] earlier this week.  They do not have it in stock and would like this sent to [facility]

## 2016-10-15 NOTE — Telephone Encounter (Signed)
Spoke with patient's daughter and informed her that we sent the prescription to the [facility]. Patient's daughter verbalized understanding.

## 2016-10-16 ENCOUNTER — Other Ambulatory Visit: Payer: Self-pay | Admitting: *Deleted

## 2016-10-16 ENCOUNTER — Telehealth: Payer: Self-pay | Admitting: *Deleted

## 2016-10-16 NOTE — Telephone Encounter (Signed)
Left message to return call to let pt's daughter know that insurance will not cover freestyle libre reader device.

## 2016-10-16 NOTE — Telephone Encounter (Signed)
Spoke with patient's daughter and informed her that [facility] reader device is not covered. Patient's daughter verbalized understanding and stated that they will continue using previous glucometer.

## 2016-11-11 ENCOUNTER — Other Ambulatory Visit: Payer: Self-pay | Admitting: Family Medicine

## 2016-11-18 ENCOUNTER — Other Ambulatory Visit: Payer: Self-pay | Admitting: Family Medicine

## 2016-12-03 ENCOUNTER — Other Ambulatory Visit: Payer: Self-pay | Admitting: Family Medicine

## 2016-12-07 ENCOUNTER — Other Ambulatory Visit: Payer: Self-pay | Admitting: Family Medicine

## 2016-12-21 ENCOUNTER — Other Ambulatory Visit: Payer: Self-pay | Admitting: *Deleted

## 2016-12-21 ENCOUNTER — Telehealth: Payer: Self-pay | Admitting: *Deleted

## 2016-12-21 MED ORDER — ALPRAZOLAM 1 MG PO TABS: ORAL_TABLET | ORAL | 1 refills | Status: DC

## 2016-12-21 NOTE — Telephone Encounter (Signed)
May refill Xanax with one additional refill

## 2016-12-21 NOTE — Telephone Encounter (Signed)
rx faxed to [place] [facility]. Daughter notified.

## 2016-12-21 NOTE — Telephone Encounter (Signed)
Patient's daughter called requesting a refill on patient's xanax. Please advise [phone]

## 2017-01-12 ENCOUNTER — Other Ambulatory Visit: Payer: Self-pay | Admitting: Family Medicine

## 2017-01-13 ENCOUNTER — Other Ambulatory Visit: Payer: Self-pay | Admitting: *Deleted

## 2017-01-13 MED ORDER — INSULIN DETEMIR 100 UNIT/ML ~~LOC~~ SOLN: SUBCUTANEOUS | 2 refills | Status: DC

## 2017-01-26 DIAGNOSIS — E784 Other hyperlipidemia: Secondary | ICD-10-CM | POA: Diagnosis not present

## 2017-01-26 DIAGNOSIS — E119 Type 2 diabetes mellitus without complications: Secondary | ICD-10-CM | POA: Diagnosis not present

## 2017-01-26 DIAGNOSIS — Z794 Long term (current) use of insulin: Secondary | ICD-10-CM | POA: Diagnosis not present

## 2017-01-26 DIAGNOSIS — I10 Essential (primary) hypertension: Secondary | ICD-10-CM | POA: Diagnosis not present

## 2017-01-27 LAB — LIPID PANEL
CHOL/HDL RATIO: 3.1 ratio (ref 0.0–5.0)
CHOLESTEROL TOTAL: 113 mg/dL (ref 100–199)
HDL: 36 mg/dL — ABNORMAL LOW (ref >39)
LDL CALC: 43 mg/dL (ref 0–99)
Triglycerides: 169 mg/dL — ABNORMAL HIGH (ref 0–149)
VLDL CHOLESTEROL CAL: 34 mg/dL (ref 5–40)

## 2017-01-27 LAB — BASIC METABOLIC PANEL
BUN / CREAT RATIO: 12 (ref 10–24)
BUN: 9 mg/dL (ref 8–27)
CO2: 22 mmol/L (ref 20–29)
CREATININE: 0.78 mg/dL (ref 0.76–1.27)
Calcium: 9.3 mg/dL (ref 8.6–10.2)
Chloride: 104 mmol/L (ref 96–106)
GFR calc Af Amer: 104 mL/min/{1.73_m2} (ref >59)
GFR, EST NON AFRICAN AMERICAN: 90 mL/min/{1.73_m2} (ref >59)
Glucose: 189 mg/dL — ABNORMAL HIGH (ref 65–99)
Potassium: 3.8 mmol/L (ref 3.5–5.2)
Sodium: 145 mmol/L — ABNORMAL HIGH (ref 134–144)

## 2017-01-27 LAB — HEMOGLOBIN A1C
ESTIMATED AVERAGE GLUCOSE: 163 mg/dL
Hgb A1c MFr Bld: 7.3 % — ABNORMAL HIGH (ref 4.8–5.6)

## 2017-02-02 ENCOUNTER — Ambulatory Visit (INDEPENDENT_AMBULATORY_CARE_PROVIDER_SITE_OTHER): Payer: Medicare Other | Admitting: Family Medicine

## 2017-02-02 ENCOUNTER — Other Ambulatory Visit: Payer: Self-pay | Admitting: Family Medicine

## 2017-02-02 ENCOUNTER — Encounter: Payer: Self-pay | Admitting: Family Medicine

## 2017-02-02 VITALS — BP 138/76 | Ht 74.0 in

## 2017-02-02 DIAGNOSIS — M5441 Lumbago with sciatica, right side: Secondary | ICD-10-CM

## 2017-02-02 DIAGNOSIS — E784 Other hyperlipidemia: Secondary | ICD-10-CM | POA: Diagnosis not present

## 2017-02-02 DIAGNOSIS — F324 Major depressive disorder, single episode, in partial remission: Secondary | ICD-10-CM

## 2017-02-02 DIAGNOSIS — I10 Essential (primary) hypertension: Secondary | ICD-10-CM

## 2017-02-02 DIAGNOSIS — G8929 Other chronic pain: Secondary | ICD-10-CM | POA: Diagnosis not present

## 2017-02-02 DIAGNOSIS — E7849 Other hyperlipidemia: Secondary | ICD-10-CM

## 2017-02-02 DIAGNOSIS — R197 Diarrhea, unspecified: Secondary | ICD-10-CM | POA: Diagnosis not present

## 2017-02-02 DIAGNOSIS — E1142 Type 2 diabetes mellitus with diabetic polyneuropathy: Secondary | ICD-10-CM | POA: Diagnosis not present

## 2017-02-02 MED ORDER — TRAZODONE HCL 50 MG PO TABS: 25.0000 mg | ORAL_TABLET | Freq: Every evening | ORAL | 3 refills | Status: DC | PRN

## 2017-02-02 MED ORDER — ZOSTER VAC RECOMB ADJUVANTED 50 MCG/0.5ML IM SUSR: 0.5000 mL | Freq: Once | INTRAMUSCULAR | 1 refills | Status: AC

## 2017-02-02 NOTE — Progress Notes (Signed)
   Subjective:    Patient ID: Francisco Powell, male    DOB: 02-11-1941, 76 y.o.   MRN: [id number]  Diabetes  He presents for his follow-up diabetic visit. He has type 2 diabetes mellitus. Pertinent negatives for hypoglycemia include no confusion. Pertinent negatives for diabetes include no chest pain, no fatigue, no polydipsia, no polyphagia and no weakness.  On levemir and novolog,and metformin Need refills on Nitroglycerin. Eats healthy and walks daily around the house. See a Eye Dr,but no foot Dr. A1C was 7.3 on August 14,2018.  Review of Systems  Constitutional: Negative for activity change, appetite change and fatigue.  HENT: Negative for congestion.   Respiratory: Negative for cough.   Cardiovascular: Negative for chest pain.  Gastrointestinal: Negative for abdominal pain.  Endocrine: Negative for polydipsia and polyphagia.  Neurological: Negative for weakness.  Psychiatric/Behavioral: Negative for confusion.   Patients moods overall are doing well although at times he still depressed about loss of his wife Takes his blood pressure medicine regular basis does have history of heart disease denies any chest tightness pressure pain or shortness of breath currently Still suffering with weakness in his legs related to chronic conditions as well as recent back surgery a year ago does walk with a walker no falls Denies get dizzy when he stands up Uses pain medicine very infrequently Takes his cholesterol medicine previous labs reviewed with the patient    Objective:   Physical Exam  Constitutional: He appears well-nourished. No distress.  Cardiovascular: Normal rate, regular rhythm and normal heart sounds.   No murmur heard. Pulmonary/Chest: Effort normal and breath sounds normal. No respiratory distress.  Musculoskeletal: He exhibits no edema.  Lymphadenopathy:    He has no cervical adenopathy.  Neurological: He is alert.  Psychiatric: His behavior is normal.  Vitals  reviewed.  Patient does have some neuropathy in his toes but otherwise doing well no ulcers      25 minutes was spent with the patient. Greater than half the time was spent in discussion and answering questions and counseling regarding the issues that the patient came in for today.  Assessment & Plan:  Blood pressure good control his daughter who is a nurse will check it intermittently let us know  Chronic low back pain pain medication when absolutely necessary cautioned drowsiness not for frequent use  Diarrhea associated with metformin reduce it to 1000 mg tablet, take a half a tablet 3 times daily  Hyperlipidemia previous labs reviewed continue current medications watch diet  Depression partial remission continue Cymbalta  Diabetes decent control with polyneuropathy continue current medication watch diet closely adjust upward on the long-acting insulin  Follow-up in 4 months to recheck A1c here in the

## 2017-02-19 ENCOUNTER — Other Ambulatory Visit: Payer: Self-pay | Admitting: Family Medicine

## 2017-03-16 ENCOUNTER — Other Ambulatory Visit: Payer: Self-pay | Admitting: Family Medicine

## 2017-04-02 ENCOUNTER — Other Ambulatory Visit: Payer: Self-pay | Admitting: Family Medicine

## 2017-04-05 ENCOUNTER — Other Ambulatory Visit: Payer: Self-pay | Admitting: Family Medicine

## 2017-04-05 DIAGNOSIS — Z23 Encounter for immunization: Secondary | ICD-10-CM | POA: Diagnosis not present

## 2017-04-30 ENCOUNTER — Other Ambulatory Visit: Payer: Self-pay | Admitting: Family Medicine

## 2017-04-30 NOTE — Telephone Encounter (Signed)
Last seen 02/02/17 

## 2017-05-20 ENCOUNTER — Ambulatory Visit (INDEPENDENT_AMBULATORY_CARE_PROVIDER_SITE_OTHER): Payer: Medicare Other | Admitting: Family Medicine

## 2017-05-20 ENCOUNTER — Encounter: Payer: Self-pay | Admitting: Family Medicine

## 2017-05-20 VITALS — BP 158/82 | Ht 74.0 in

## 2017-05-20 DIAGNOSIS — M5441 Lumbago with sciatica, right side: Secondary | ICD-10-CM | POA: Diagnosis not present

## 2017-05-20 DIAGNOSIS — E7849 Other hyperlipidemia: Secondary | ICD-10-CM | POA: Diagnosis not present

## 2017-05-20 DIAGNOSIS — I10 Essential (primary) hypertension: Secondary | ICD-10-CM

## 2017-05-20 DIAGNOSIS — G8929 Other chronic pain: Secondary | ICD-10-CM | POA: Diagnosis not present

## 2017-05-20 DIAGNOSIS — E119 Type 2 diabetes mellitus without complications: Secondary | ICD-10-CM | POA: Diagnosis not present

## 2017-05-20 LAB — POCT GLYCOSYLATED HEMOGLOBIN (HGB A1C): Hemoglobin A1C: 6.8

## 2017-05-20 MED ORDER — DOXYCYCLINE HYCLATE 100 MG PO TABS: 100.0000 mg | ORAL_TABLET | Freq: Two times a day (BID) | ORAL | 0 refills | Status: DC

## 2017-05-20 MED ORDER — ALPRAZOLAM 1 MG PO TABS: ORAL_TABLET | ORAL | 5 refills | Status: DC

## 2017-05-20 NOTE — Progress Notes (Signed)
   Subjective:    Patient ID: Francisco Powell, male    DOB: 1941-02-11, 76 y.o.   MRN: [id number]  HPI Pt is here today to follow up on DM.He is currently on Metformin 500 mg TID.Novolog 25u  TID,Levemir 50 u am 55u pm. Eats what he wants.He gets exercise. Does not see an eye Dr or Foot Dr. The patient states he uses Xanax occasionally at night to help him sleep Does take his blood pressure medicine regular basis watches his diet Denies any CHF symptoms Is tolerating his diabetes medicine no low sugar spells Tolerates his cholesterol medicine tries to watch diet to some degree Uses trazodone at night to help sleep Does not use oxycodone any longer for his back pain he tolerates it as best he can Left buttock region somewhat tender sore and red best I can tell this is from him sitting on it a lot more than normal  Review of Systems Results for orders placed or performed in visit on 05/20/17  POCT glycosylated hemoglobin (Hb A1C)  Result Value Ref Range   Hemoglobin A1C 6.8   Denies chest tightness pressure pain shortness of breath nausea vomiting diarrhea     Objective:   Physical Exam Lungs are clear no crackles heart regular pulse normal BP is good extremities no edema diabetic foot exam normal buttock area has redness on the left cheek  25 minutes was spent with the patient. Greater than half the time was spent in discussion and answering questions and counseling regarding the issues that the patient came in for today.  Refill of Xanax given    Assessment & Plan:  Buttock area antibiotic prescribed warning signs discussed follow-up if problems  HTN good control continue current measures  Chronic back pain try to get up and move about as much as possible Diabetes good control continue current measures  Hyperlipidemia previous labs reviewed We will do comprehensive lab work when the patient follows up in the spring

## 2017-06-16 ENCOUNTER — Other Ambulatory Visit: Payer: Self-pay | Admitting: Family Medicine

## 2017-07-05 ENCOUNTER — Other Ambulatory Visit: Payer: Self-pay | Admitting: Family Medicine

## 2017-07-06 ENCOUNTER — Other Ambulatory Visit: Payer: Self-pay | Admitting: Family Medicine

## 2017-07-12 ENCOUNTER — Other Ambulatory Visit: Payer: Self-pay | Admitting: Family Medicine

## 2017-07-24 ENCOUNTER — Emergency Department (HOSPITAL_COMMUNITY): Payer: Medicare Other

## 2017-07-24 ENCOUNTER — Other Ambulatory Visit: Payer: Self-pay

## 2017-07-24 ENCOUNTER — Encounter (HOSPITAL_COMMUNITY): Payer: Self-pay

## 2017-07-24 ENCOUNTER — Observation Stay (HOSPITAL_COMMUNITY)
Admission: EM | Admit: 2017-07-24 | Discharge: 2017-07-25 | Disposition: A | Discharge status: Home / Self Care | Payer: Medicare Other | Attending: Internal Medicine | Admitting: Internal Medicine

## 2017-07-24 DIAGNOSIS — I452 Bifascicular block: Secondary | ICD-10-CM | POA: Insufficient documentation

## 2017-07-24 DIAGNOSIS — M545 Low back pain, unspecified: Secondary | ICD-10-CM | POA: Diagnosis present

## 2017-07-24 DIAGNOSIS — I2510 Atherosclerotic heart disease of native coronary artery without angina pectoris: Secondary | ICD-10-CM | POA: Diagnosis not present

## 2017-07-24 DIAGNOSIS — E119 Type 2 diabetes mellitus without complications: Secondary | ICD-10-CM | POA: Diagnosis not present

## 2017-07-24 DIAGNOSIS — G8929 Other chronic pain: Secondary | ICD-10-CM | POA: Insufficient documentation

## 2017-07-24 DIAGNOSIS — Z85528 Personal history of other malignant neoplasm of kidney: Secondary | ICD-10-CM | POA: Insufficient documentation

## 2017-07-24 DIAGNOSIS — Z79899 Other long term (current) drug therapy: Secondary | ICD-10-CM | POA: Diagnosis not present

## 2017-07-24 DIAGNOSIS — R072 Precordial pain: Secondary | ICD-10-CM | POA: Diagnosis not present

## 2017-07-24 DIAGNOSIS — Z7982 Long term (current) use of aspirin: Secondary | ICD-10-CM | POA: Insufficient documentation

## 2017-07-24 DIAGNOSIS — M5441 Lumbago with sciatica, right side: Secondary | ICD-10-CM | POA: Diagnosis not present

## 2017-07-24 DIAGNOSIS — E785 Hyperlipidemia, unspecified: Secondary | ICD-10-CM | POA: Insufficient documentation

## 2017-07-24 DIAGNOSIS — R079 Chest pain, unspecified: Secondary | ICD-10-CM | POA: Diagnosis not present

## 2017-07-24 DIAGNOSIS — Z794 Long term (current) use of insulin: Secondary | ICD-10-CM | POA: Diagnosis not present

## 2017-07-24 DIAGNOSIS — I10 Essential (primary) hypertension: Secondary | ICD-10-CM | POA: Insufficient documentation

## 2017-07-24 DIAGNOSIS — I252 Old myocardial infarction: Secondary | ICD-10-CM | POA: Diagnosis not present

## 2017-07-24 LAB — CBC
HCT: 46.3 % (ref 39.0–52.0)
HEMOGLOBIN: 13.7 g/dL (ref 13.0–17.0)
MCH: 25.3 pg — ABNORMAL LOW (ref 26.0–34.0)
MCHC: 29.6 g/dL — AB (ref 30.0–36.0)
MCV: 85.6 fL (ref 78.0–100.0)
PLATELETS: 281 10*3/uL (ref 150–400)
RBC: 5.41 MIL/uL (ref 4.22–5.81)
RDW: 17.9 % — AB (ref 11.5–15.5)
WBC: 11.1 10*3/uL — ABNORMAL HIGH (ref 4.0–10.5)

## 2017-07-24 LAB — URINALYSIS, ROUTINE W REFLEX MICROSCOPIC
Bacteria, UA: NONE SEEN
Bilirubin Urine: NEGATIVE
Hgb urine dipstick: NEGATIVE
Ketones, ur: 5 mg/dL — AB
Leukocytes, UA: NEGATIVE
Nitrite: NEGATIVE
PH: 5 (ref 5.0–8.0)
Protein, ur: 100 mg/dL — AB
SPECIFIC GRAVITY, URINE: 1.023 (ref 1.005–1.030)
Squamous Epithelial / LPF: NONE SEEN

## 2017-07-24 LAB — BASIC METABOLIC PANEL
ANION GAP: 14 (ref 5–15)
BUN: 12 mg/dL (ref 6–20)
CALCIUM: 9.3 mg/dL (ref 8.9–10.3)
CO2: 27 mmol/L (ref 22–32)
Chloride: 102 mmol/L (ref 101–111)
Creatinine, Ser: 0.82 mg/dL (ref 0.61–1.24)
GFR calc Af Amer: >60 mL/min (ref >60)
GLUCOSE: 134 mg/dL — AB (ref 65–99)
Potassium: 3.2 mmol/L — ABNORMAL LOW (ref 3.5–5.1)
Sodium: 143 mmol/L (ref 135–145)

## 2017-07-24 LAB — GLUCOSE, CAPILLARY: Glucose-Capillary: 108 mg/dL — ABNORMAL HIGH (ref 65–99)

## 2017-07-24 LAB — TROPONIN I

## 2017-07-24 MED ORDER — OXYCODONE-ACETAMINOPHEN 5-325 MG PO TABS: 1.0000 | ORAL_TABLET | Freq: Four times a day (QID) | ORAL | Status: DC | PRN

## 2017-07-24 MED ORDER — ACETAMINOPHEN 325 MG PO TABS: 650.0000 mg | ORAL_TABLET | ORAL | Status: DC | PRN

## 2017-07-24 MED ORDER — ASPIRIN EC 325 MG PO TBEC
325.0000 mg | DELAYED_RELEASE_TABLET | Freq: Every day | ORAL | Status: DC
  Filled 2017-07-24: qty 1

## 2017-07-24 MED ORDER — LISINOPRIL 10 MG PO TABS
20.0000 mg | ORAL_TABLET | Freq: Every day | ORAL | Status: DC
  Administered 2017-07-25: 20 mg via ORAL
  Filled 2017-07-24: qty 2

## 2017-07-24 MED ORDER — TRAZODONE HCL 50 MG PO TABS
25.0000 mg | ORAL_TABLET | Freq: Every evening | ORAL | Status: DC | PRN
  Administered 2017-07-24: 50 mg via ORAL
  Filled 2017-07-24: qty 1

## 2017-07-24 MED ORDER — OXYCODONE HCL 5 MG PO TABS: 5.0000 mg | ORAL_TABLET | Freq: Four times a day (QID) | ORAL | Status: DC | PRN

## 2017-07-24 MED ORDER — CARVEDILOL 12.5 MG PO TABS
25.0000 mg | ORAL_TABLET | Freq: Two times a day (BID) | ORAL | Status: DC
  Administered 2017-07-25: 25 mg via ORAL
  Filled 2017-07-24: qty 2

## 2017-07-24 MED ORDER — POLYETHYLENE GLYCOL 3350 17 G PO PACK: 17.0000 g | PACK | Freq: Every day | ORAL | Status: DC | PRN

## 2017-07-24 MED ORDER — MELATONIN 5 MG PO TABS
10.0000 mg | ORAL_TABLET | Freq: Every day | ORAL | Status: DC
  Filled 2017-07-24 (×2): qty 2

## 2017-07-24 MED ORDER — INSULIN ASPART 100 UNIT/ML ~~LOC~~ SOLN
0.0000 [IU] | Freq: Three times a day (TID) | SUBCUTANEOUS | Status: DC
  Administered 2017-07-25: 2 [IU] via SUBCUTANEOUS
  Administered 2017-07-25: 3 [IU] via SUBCUTANEOUS

## 2017-07-24 MED ORDER — NITROGLYCERIN 0.4 MG SL SUBL: 0.4000 mg | SUBLINGUAL_TABLET | SUBLINGUAL | Status: DC | PRN

## 2017-07-24 MED ORDER — DULOXETINE HCL 30 MG PO CPEP
60.0000 mg | ORAL_CAPSULE | Freq: Every day | ORAL | Status: DC
  Administered 2017-07-25: 60 mg via ORAL
  Filled 2017-07-24: qty 2

## 2017-07-24 MED ORDER — ASPIRIN 81 MG PO CHEW
81.0000 mg | CHEWABLE_TABLET | Freq: Every day | ORAL | Status: DC
  Administered 2017-07-25: 81 mg via ORAL
  Filled 2017-07-24: qty 1

## 2017-07-24 MED ORDER — BACLOFEN 10 MG PO TABS
20.0000 mg | ORAL_TABLET | Freq: Three times a day (TID) | ORAL | Status: DC
  Filled 2017-07-24: qty 2

## 2017-07-24 MED ORDER — ONDANSETRON HCL 4 MG/2ML IJ SOLN
4.0000 mg | Freq: Four times a day (QID) | INTRAMUSCULAR | Status: DC | PRN
Start: 2017-07-24 — End: 2017-07-25

## 2017-07-24 MED ORDER — AMLODIPINE BESYLATE 5 MG PO TABS
10.0000 mg | ORAL_TABLET | Freq: Every day | ORAL | Status: DC
  Administered 2017-07-25: 10 mg via ORAL
  Filled 2017-07-24: qty 2

## 2017-07-24 NOTE — H&P (Signed)
History and Physical    Francisco Powell QVZ:[id number] DOB: 1940-07-20 DOA: 07/24/2017  PCP: [name], MD  Patient coming from: Home  Chief Complaint: Chest pain  HPI: Francisco Powell is a 77 y.o. male with medical history significant of coronary artery disease status post stents in 2015, diabetes, hypertension comes in with pressure-like sensation in his substernal area earlier this afternoon that was associated with a lot of belching.  This lasted for about 2 hours and was partially relieved by a home nitroglycerin pill.  It then spontaneously resolved on its own.  He denies any coughing  fevers or shortness of breath.  He denies any lower extremity edema or pain.  This did not feel like his previous heart attacks.  Patient is referred for admission for rule out.  Review of Systems: As per HPI otherwise 10 point review of systems negative.   Past Medical History:  Diagnosis Date  . Anemia, iron deficiency   . Anxiety   . Asbestosis(501)   . Coronary atherosclerosis of native coronary artery    DES RCA and staged DES LAD May 2015, LVEF 55%  . Essential hypertension, benign   . Gout   . Hx of adenomatous colonic polyps   . Hyperlipidemia   . Kidney carcinoma ([place]) 1998  . Lumbar back pain    Spondylosis and spondylolisthesis   . Myocardial infarction ([place]) 2004  . Nephrolithiasis   . NSTEMI (non-ST elevated myocardial infarction) [facility])    May 2015  . Obesity   . Sciatica of right side   . Type II diabetes mellitus ([place])     Past Surgical History:  Procedure Laterality Date  . APPENDECTOMY    . BACK SURGERY  X 4  . CHOLECYSTECTOMY     Biliary dyskinesia  . COLONOSCOPY  2009   Dr. Arnoldo Morale: normal  . COLONOSCOPY  June 2011   Dr. Gala Romney: normal rectum, left-sided diverticula  . COLONOSCOPY N/A 04/12/2015   Procedure: COLONOSCOPY;  Surgeon: [name], MD;  [facility];  Service: Endoscopy;  Laterality: N/A;  1115   . CYSTOSCOPY W/ LITHOLAPAXY / EHL   2009  . ESOPHAGOGASTRODUODENOSCOPY  June 2011   Dr. Gala Romney: erosive reflux esophagitis, small hiatal hernia, +H.pylori gastritis  . EYE SURGERY    . HOLMIUM LASER APPLICATION Right 3295  . LEFT HEART CATHETERIZATION WITH CORONARY ANGIOGRAM N/A 11/09/2013   Procedure: LEFT HEART CATHETERIZATION WITH CORONARY ANGIOGRAM;  Surgeon: [name], MD;  Location: [facility] CATH LAB;  Service: Cardiovascular;  Laterality: N/A;  . LUMBAR DISC SURGERY  X 3  . PERCUTANEOUS CORONARY STENT INTERVENTION (PCI-S) N/A 11/10/2013   Procedure: PERCUTANEOUS CORONARY STENT INTERVENTION (PCI-S);  Surgeon: [name], MD;  Location: [facility] CATH LAB;  Service: Cardiovascular;  Laterality: N/A;  . POSTERIOR LUMBAR FUSION  2014  . RETINAL DETACHMENT SURGERY Right 1998   Secondary to injury detached retina/cataracts  . TONSILLECTOMY    . TUMOR EXCISION  1995     reports that  has never smoked. His smokeless tobacco use includes chew. He reports that he does not drink alcohol or use drugs.  Allergies  Allergen Reactions  . Penicillins Hives, Itching and Swelling    Has patient had a PCN reaction causing immediate rash, facial/tongue/throat swelling, SOB or lightheadedness with hypotension: Unknown Has patient had a PCN reaction causing severe rash involving mucus membranes or skin necrosis: Unknown Has patient had a PCN reaction that required hospitalization: Unknown Has patient  had a PCN reaction occurring within the last 10 years: Unknown If all of the above answers are "NO", then may proceed with Cephalosporin use.   . Other     Bee stings  . Atorvastatin Other (See Comments)    Muscle pain/cramps   . Celexa [Citalopram] Diarrhea    Family History  Problem Relation Age of Onset  . Colon cancer Mother        deceased   . Cancer Maternal Grandmother        Gastric cancer  . Breast cancer Sister   . Heart attack Father   . Colon cancer Maternal Uncle   . Colon cancer Maternal Uncle   . Colon cancer  Other        maternal great uncle  . Colon polyps Sister     Prior to Admission medications   Medication Sig Start Date End Date Taking? Authorizing Provider  ALPRAZolam Duanne Moron) 1 MG tablet 0.5-1 tablet at bedtime as needed for sleep 05/20/17  Yes Luking, Scott A, MD  amLODipine (NORVASC) 10 MG tablet TAKE (1) TABLET BY MOUTH ONCE DAILY. 01/12/17  Yes [name], MD  aspirin (ASPIRIN 81) 81 MG chewable tablet Chew 81 mg by mouth daily.   Yes [provider]  carvedilol (COREG) 25 MG tablet TAKE (1) TABLET BY MOUTH TWICE DAILY. 03/16/17  Yes Luking, Scott A, MD  Diphenhydramine-APAP, sleep, (TYLENOL PM EXTRA STRENGTH PO) Take 1 tablet by mouth every 4 (four) hours as needed.   Yes [provider]  DULoxetine (CYMBALTA) 60 MG capsule TAKE ONE CAPSULE BY MOUTH DAILY. 06/16/17  Yes Luking, Elayne Snare, MD  FARXIGA 10 MG TABS tablet TAKE ONE TABLET BY MOUTH ONCE DAILY. 02/22/17  Yes Luking, Scott A, MD  LEVEMIR 100 UNIT/ML injection INJECT 50 UNITS INTO THE SKIN TWICE DAILY. Patient taking differently: INJECT 55 UNITS INTO THE SKIN TWICE DAILY. 07/12/17  Yes Luking, Elayne Snare, MD  lisinopril (PRINIVIL,ZESTRIL) 20 MG tablet TAKE 1 TABLET BY MOUTH ONCE A DAY. 04/05/17  Yes Luking, Elayne Snare, MD  Melatonin 10 MG TABS Take 1 tablet by mouth at bedtime.   Yes [provider]  metFORMIN (GLUCOPHAGE) 1000 MG tablet TAKE 1 TABLET BY MOUTH TWICE DAILY WITH A MEAL. Patient taking differently: 500 mg TID 01/12/17  Yes Luking, Elayne Snare, MD  nitroGLYCERIN (NITROSTAT) 0.4 MG SL tablet Place 1 tablet (0.4 mg total) under the tongue every 5 (five) minutes x 3 doses as needed for chest pain. 11/15/14  Yes [name], MD  NOVOLOG FLEXPEN 100 UNIT/ML FlexPen INJECT 25 UNITS INTO THE SKIN 3 TIMES DAILY. 07/05/17  Yes Luking, Elayne Snare, MD  OVER THE COUNTER MEDICATION Take 1 tablet by mouth as needed (constipation).   Yes [provider]  oxyCODONE-acetaminophen (PERCOCET) 10-325 MG tablet  Take one tablet by mouth every 4 hours as needed for pain 04/03/16  Yes Eulas Post, Harvel, DO  polyethylene glycol (MIRALAX / GLYCOLAX) packet Take 17 g by mouth daily as needed for mild constipation. 04/03/16  Yes [name], MD  pravastatin (PRAVACHOL) 80 MG tablet TAKE 1 TABLET BY MOUTH AT BEDTIME FOR CHOLESTEROL. 01/12/17  Yes [name], MD  traZODone (DESYREL) 50 MG tablet Take 0.5-1 tablets (25-50 mg total) by mouth at bedtime as needed for sleep. 02/02/17  Yes Luking, Elayne Snare, MD  B-D ULTRAFINE III SHORT PEN 31G X 8 MM MISC USE AS DIRECTED. 06/09/16   [name], MD  baclofen (LIORESAL)  20 MG tablet Take 20 mg by mouth 3 (three) times daily. Takes one tablet (1)daily    [provider]  doxycycline (VIBRA-TABS) 100 MG tablet Take 1 tablet (100 mg total) by mouth 2 (two) times daily. Patient not taking: Reported on 07/24/2017 05/20/17   [name], MD  GLUCERNA [facility]) LIQD Take 237 mLs by mouth 2 (two) times daily between meals.    [provider]  Menthol, Topical Analgesic, (BIOFREEZE) 4 % GEL Apply Biofreeze to spine for pain management    [provider]    Physical Exam: Vitals:   07/24/17 1930 07/24/17 2000 07/24/17 2030 07/24/17 2049  BP: (!) 186/91 (!) 180/92 (!) 188/98 (!) 188/98  Pulse: 62 62 60 71  Resp:    18  Temp:      TempSrc:      SpO2: 95% 95% 94% 93%  Weight:      Height:          Constitutional: NAD, calm, comfortable Vitals:   07/24/17 1930 07/24/17 2000 07/24/17 2030 07/24/17 2049  BP: (!) 186/91 (!) 180/92 (!) 188/98 (!) 188/98  Pulse: 62 62 60 71  Resp:    18  Temp:      TempSrc:      SpO2: 95% 95% 94% 93%  Weight:      Height:       Eyes: PERRL, lids and conjunctivae normal ENMT: Mucous membranes are moist. Posterior pharynx clear of any exudate or lesions.Normal dentition.  Neck: normal, supple, no masses, no thyromegaly Respiratory: clear to auscultation bilaterally, no wheezing, no crackles.  Normal respiratory effort. No accessory muscle use.  Cardiovascular: Regular rate and rhythm, no murmurs / rubs / gallops. No extremity edema. 2+ pedal pulses. No carotid bruits.  Abdomen: no tenderness, no masses palpated. No hepatosplenomegaly. Bowel sounds positive.  Musculoskeletal: no clubbing / cyanosis. No joint deformity upper and lower extremities. Good ROM, no contractures. Normal muscle tone.  Skin: no rashes, lesions, ulcers. No induration Neurologic: CN 2-12 grossly intact. Sensation intact, DTR normal. Strength 5/5 in all 4.  Psychiatric: Normal judgment and insight. Alert and oriented x 3. Normal mood.    Labs on Admission: I have personally reviewed following labs and imaging studies  CBC: Recent Labs  Lab 07/24/17 1704  WBC 11.1*  HGB 13.7  HCT 46.3  MCV 85.6  PLT 409   Basic Metabolic Panel: Recent Labs  Lab 07/24/17 1704  NA 143  K 3.2*  CL 102  CO2 27  GLUCOSE 134*  BUN 12  CREATININE 0.82  CALCIUM 9.3   GFR: Estimated Creatinine Clearance: 110.4 mL/min (by C-G formula based on SCr of 0.82 mg/dL). Liver Function Tests: No results for input(s): AST, ALT, ALKPHOS, BILITOT, PROT, ALBUMIN in the last 168 hours. No results for input(s): LIPASE, AMYLASE in the last 168 hours. No results for input(s): AMMONIA in the last 168 hours. Coagulation Profile: No results for input(s): INR, PROTIME in the last 168 hours. Cardiac Enzymes: Recent Labs  Lab 07/24/17 1704  TROPONINI <0.03   BNP (last 3 results) No results for input(s): PROBNP in the last 8760 hours. HbA1C: No results for input(s): HGBA1C in the last 72 hours. CBG: No results for input(s): GLUCAP in the last 168 hours. Lipid Profile: No results for input(s): CHOL, HDL, LDLCALC, TRIG, CHOLHDL, LDLDIRECT in the last 72 hours. Thyroid Function Tests: No results for input(s): TSH, T4TOTAL, FREET4, T3FREE, THYROIDAB in the last 72 hours. Anemia Panel: No results for  input(s): VITAMINB12, FOLATE,  FERRITIN, TIBC, IRON, RETICCTPCT in the last 72 hours. Urine analysis:    Component Value Date/Time   COLORURINE YELLOW 07/24/2017 1948   APPEARANCEUR CLEAR 07/24/2017 1948   LABSPEC 1.023 07/24/2017 1948   PHURINE 5.0 07/24/2017 1948   GLUCOSEU >=500 (A) 07/24/2017 1948   HGBUR NEGATIVE 07/24/2017 1948   BILIRUBINUR NEGATIVE 07/24/2017 1948   KETONESUR 5 (A) 07/24/2017 1948   PROTEINUR 100 (A) 07/24/2017 1948   UROBILINOGEN 0.2 10/28/2008 0024   NITRITE NEGATIVE 07/24/2017 1948   LEUKOCYTESUR NEGATIVE 07/24/2017 1948   Sepsis Labs: !!!!!!!!!!!!!!!!!!!!!!!!!!!!!!!!!!!!!!!!!!!! @LABRCNTIP (procalcitonin:4,lacticidven:4) )No results found for this or any previous visit (from the past 240 hour(s)).   Radiological Exams on Admission: Dg Chest 2 View  Result Date: 07/24/2017 CLINICAL DATA:  Chest pain EXAM: CHEST  2 VIEW COMPARISON:  Chest CT April 17, 2016 and chest radiograph March 30, 2016 FINDINGS: There is no edema or consolidation. Heart is upper normal in size with pulmonary vascularity within normal limits. No adenopathy. There are foci of degenerative change in the thoracic spine. No pneumothorax. IMPRESSION: No edema or consolidation Electronically Signed   By: Lowella Grip III M.D.   On: 07/24/2017 16:11    EKG: Independently reviewed.  Normal sinus rhythm right bundle branch block old compared with old Old chart reviewed Discussed with EDP Chest x-ray reviewed no edema or infiltrate  Assessment/Plan 77 year old male with a history of coronary artery disease comes in with atypical chest pain  Principal Problem:   Chest pain-patient is agreeable to stay here in [place] despite is not having cardiology coverage over the weekend our capability to do an echo in the morning.  If patient have recurrence of his symptoms or his troponin rises significantly with transfer to [facility] he is agreeable to this at this time.  Placed on aspirin.  Serial cardiac enzymes.   Outpatient follow-up with Dr. Alene Mires.  Active Problems:   Bifascicular block - RBBB, LAFB-stable   Essential hypertension-continue his Coreg and Norvasc   Coronary atherosclerosis of native coronary artery-continue aspirin   Chronic low back pain-continue his home Percocet dosing    DVT prophylaxis: SCDs  code Status: Full Family Communication: Son Disposition Plan: Per day team Consults called: None Admission status: Observation   Francisco Powell A MD [facility]  If 7PM-7AM, please contact night-coverage [email or web address] Password [email or web address]  07/24/2017, 9:01 PM

## 2017-07-24 NOTE — ED Provider Notes (Signed)
[facility] EMERGENCY DEPARTMENT Provider Note   CSN: [id number] Arrival date & time: 07/24/17  1531     History   Chief Complaint Chief Complaint  Patient presents with  . Chest Pain    HPI Francisco Powell is a 77 y.o. male.  Patient with a history of cardiac disease.  Status post non-STEMI in 2015 and had stents placed.  Last seen by cardiology here in [place] in 2017.  Patient with onset of anterior chest pain at 1445 today lasted until 1615.  At home he took one nitroglycerin and 3 baby aspirin without any resolution of the pain but it did improve.  It went away spontaneously while he was getting chest x-ray here.  Patient stated above has had stents placed normally does not get chest pain at all.  Last seen by cardiology in 2017.  Has remained pain-free no some other symptoms with it no nausea vomiting no shortness of breath.      Past Medical History:  Diagnosis Date  . Anemia, iron deficiency   . Anxiety   . Asbestosis(501)   . Coronary atherosclerosis of native coronary artery    DES RCA and staged DES LAD May 2015, LVEF 55%  . Essential hypertension, benign   . Gout   . Hx of adenomatous colonic polyps   . Hyperlipidemia   . Kidney carcinoma ([place]) 1998  . Lumbar back pain    Spondylosis and spondylolisthesis   . Myocardial infarction ([place]) 2004  . Nephrolithiasis   . NSTEMI (non-ST elevated myocardial infarction) [facility])    May 2015  . Obesity   . Sciatica of right side   . Type II diabetes mellitus [facility])     Patient Active Problem List   Diagnosis Date Noted  . Depression, major, single episode, in partial remission ([place]) 10/01/2016  . Diarrhea 04/20/2016  . UTI (urinary tract infection) 03/30/2016  . Fall at home 03/30/2016  . Generalized weakness 03/30/2016  . Acute encephalopathy 03/30/2016  . Hypokalemia 03/30/2016  . Chronic low back pain 03/30/2016  . Pressure ulcer of ankle 03/30/2016  . Pressure ulcer, sacrum 03/30/2016  . Right leg  weakness 03/18/2016  . Insulin dependent diabetes mellitus ([place]) 08/15/2015  . History of colonic polyps   . Hx of adenomatous colonic polyps 04/04/2015  . Elevated PSA 09/04/2014  . Adenomatous polyps 03/06/2014  . FH: colon cancer 03/06/2014  . Helicobacter pylori (H. pylori) infection 03/06/2014  . Postherpetic neuralgia 02/28/2014  . Depression 01/14/2014  . Coronary atherosclerosis of native coronary artery 11/11/2013  . Bifascicular block - RBBB, LAFB 11/10/2013  . Morbid obesity-may need sleep study has sleep bradycardia 11/10/2013  . Essential hypertension 11/10/2013  . Hyperlipidemia 10/31/2008    Past Surgical History:  Procedure Laterality Date  . APPENDECTOMY    . BACK SURGERY  X 4  . CHOLECYSTECTOMY     Biliary dyskinesia  . COLONOSCOPY  2009   Dr. Arnoldo Morale: normal  . COLONOSCOPY  June 2011   Dr. Gala Romney: normal rectum, left-sided diverticula  . COLONOSCOPY N/A 04/12/2015   Procedure: COLONOSCOPY;  Surgeon: [name], MD;  [facility];  Service: Endoscopy;  Laterality: N/A;  1115   . CYSTOSCOPY W/ LITHOLAPAXY / EHL  2009  . ESOPHAGOGASTRODUODENOSCOPY  June 2011   Dr. Gala Romney: erosive reflux esophagitis, small hiatal hernia, +H.pylori gastritis  . EYE SURGERY    . HOLMIUM LASER APPLICATION Right 3419  . LEFT HEART CATHETERIZATION WITH CORONARY ANGIOGRAM N/A 11/09/2013  Procedure: LEFT HEART CATHETERIZATION WITH CORONARY ANGIOGRAM;  Surgeon: [name], MD;  Location: [facility] CATH LAB;  Service: Cardiovascular;  Laterality: N/A;  . LUMBAR DISC SURGERY  X 3  . PERCUTANEOUS CORONARY STENT INTERVENTION (PCI-S) N/A 11/10/2013   Procedure: PERCUTANEOUS CORONARY STENT INTERVENTION (PCI-S);  Surgeon: [name], MD;  Location: [facility] CATH LAB;  Service: Cardiovascular;  Laterality: N/A;  . POSTERIOR LUMBAR FUSION  2014  . RETINAL DETACHMENT SURGERY Right 1998   Secondary to injury detached retina/cataracts  . TONSILLECTOMY    . TUMOR EXCISION  1995        Home Medications    Prior to Admission medications   Medication Sig Start Date End Date Taking? Authorizing Provider  ALPRAZolam Duanne Moron) 1 MG tablet 0.5-1 tablet at bedtime as needed for sleep 05/20/17   [name], MD  amLODipine (NORVASC) 10 MG tablet TAKE (1) TABLET BY MOUTH ONCE DAILY. 01/12/17   [name], MD  aspirin (ASPIRIN 81) 81 MG chewable tablet Chew 81 mg by mouth daily.    [provider]  B-D ULTRAFINE III SHORT PEN 31G X 8 MM MISC USE AS DIRECTED. 06/09/16   [name], MD  baclofen (LIORESAL) 20 MG tablet Take 20 mg by mouth 3 (three) times daily. Takes one tablet (1)daily    [provider]  carvedilol (COREG) 25 MG tablet TAKE (1) TABLET BY MOUTH TWICE DAILY. 03/16/17   [name], MD  doxycycline (VIBRA-TABS) 100 MG tablet Take 1 tablet (100 mg total) by mouth 2 (two) times daily. 05/20/17   [name], MD  DULoxetine (CYMBALTA) 60 MG capsule TAKE ONE CAPSULE BY MOUTH DAILY. 06/16/17   Luking, Elayne Snare, MD  FARXIGA 10 MG TABS tablet TAKE ONE TABLET BY MOUTH ONCE DAILY. 02/22/17   Luking, Elayne Snare, MD  GLUCERNA (GLUCERNA) LIQD Take 237 mLs by mouth 2 (two) times daily between meals.    [provider]  LEVEMIR 100 UNIT/ML injection INJECT 50 UNITS INTO THE SKIN TWICE DAILY. 07/12/17   [name], MD  lisinopril (PRINIVIL,ZESTRIL) 20 MG tablet TAKE 1 TABLET BY MOUTH ONCE A DAY. 04/05/17   [name], MD  Melatonin 10 MG TABS Take 1 tablet by mouth at bedtime.    [provider]  Menthol, Topical Analgesic, (BIOFREEZE) 4 % GEL Apply Biofreeze to spine for pain management    [provider]  metFORMIN (GLUCOPHAGE) 1000 MG tablet TAKE 1 TABLET BY MOUTH TWICE DAILY WITH A MEAL. Patient taking differently: 500 mg TID 01/12/17   [name], MD  nitroGLYCERIN (NITROSTAT) 0.4 MG SL tablet Place 1 tablet (0.4 mg total) under the tongue every 5 (five) minutes x 3 doses as needed for chest pain. 11/15/14    [name], MD  NOVOLOG FLEXPEN 100 UNIT/ML FlexPen INJECT 25 UNITS INTO THE SKIN 3 TIMES DAILY. 07/05/17   [name], MD  oxyCODONE-acetaminophen (PERCOCET) 10-325 MG tablet Take one tablet by mouth every 4 hours as needed for pain 04/03/16   Gildardo Cranker, DO  polyethylene glycol [facility] / GLYCOLAX) packet Take 17 g by mouth daily as needed for mild constipation. 04/03/16   [name], MD  pravastatin (PRAVACHOL) 80 MG tablet TAKE 1 TABLET BY MOUTH AT BEDTIME FOR CHOLESTEROL. 01/12/17   [name], MD  traZODone (DESYREL) 50 MG tablet Take 0.5-1 tablets (25-50 mg total) by mouth at bedtime as needed for sleep. 02/02/17   Luking,  Elayne Snare, MD  traZODone (DESYREL) 50 MG tablet TAKE 1/2-1 TABLET BY MOUTH AT BEDTIME AS NEEDED FOR SLEEP. 04/30/17   Mikey Kirschner, MD  traZODone (DESYREL) 50 MG tablet TAKE 1/2-1 TABLET BY MOUTH AT BEDTIME AS NEEDED FOR SLEEP. 07/07/17   [name], MD    Family History Family History  Problem Relation Age of Onset  . Colon cancer Mother        deceased   . Cancer Maternal Grandmother        Gastric cancer  . Breast cancer Sister   . Heart attack Father   . Colon cancer Maternal Uncle   . Colon cancer Maternal Uncle   . Colon cancer Other        maternal great uncle  . Colon polyps Sister     Social History Social History   Tobacco Use  . Smoking status: Never Smoker  . Smokeless tobacco: Current User    Types: Chew  Substance Use Topics  . Alcohol use: No    Alcohol/week: 0.0 oz  . Drug use: No     Allergies   Penicillins; Other; Atorvastatin; and Celexa [citalopram]   Review of Systems Review of Systems  Constitutional: Negative for fever.  HENT: Negative for congestion.   Eyes: Negative for visual disturbance.  Respiratory: Negative for shortness of breath.   Cardiovascular: Positive for chest pain.  Gastrointestinal: Negative for abdominal pain.  Genitourinary: Negative for dysuria.  Musculoskeletal:  Negative for back pain.  Skin: Negative for rash.  Neurological: Negative for syncope and headaches.  Hematological: Does not bruise/bleed easily.  Psychiatric/Behavioral: Negative for confusion.     Physical Exam Updated Vital Signs BP (!) 196/88   Pulse 63   Temp 98.6 F (37 C) (Oral)   Resp 17   Ht 1.88 m (6\' 2" )   Wt 123.8 kg (273 lb)   SpO2 94%   BMI 35.05 kg/m   Physical Exam  Constitutional: He is oriented to person, place, and time. He appears well-developed and well-nourished.  HENT:  Head: Normocephalic and atraumatic.  Mouth/Throat: Oropharynx is clear and moist.  Eyes: Conjunctivae and EOM are normal. Pupils are equal, round, and reactive to light.  Neck: Normal range of motion. Neck supple.  Cardiovascular: Normal rate, regular rhythm and normal heart sounds.  Pulmonary/Chest: Effort normal and breath sounds normal. No respiratory distress.  Abdominal: Soft. Bowel sounds are normal. There is no tenderness.  Musculoskeletal: Normal range of motion. He exhibits no edema.  Neurological: He is alert and oriented to person, place, and time. No cranial nerve deficit or sensory deficit. He exhibits normal muscle tone. Coordination normal.  Nursing note and vitals reviewed.    ED Treatments / Results  Labs (all labs ordered are listed, but only abnormal results are displayed) Labs Reviewed  BASIC METABOLIC PANEL - Abnormal; Notable for the following components:      Result Value   Potassium 3.2 (*)    Glucose, Bld 134 (*)    All other components within normal limits  CBC - Abnormal; Notable for the following components:   WBC 11.1 (*)    MCH 25.3 (*)    MCHC 29.6 (*)    RDW 17.9 (*)    All other components within normal limits  TROPONIN I  URINALYSIS, ROUTINE W REFLEX MICROSCOPIC    EKG  EKG Interpretation  Date/Time:  Saturday July 24 2017 15:47:53 EST Ventricular Rate:  110 PR Interval:    QRS Duration: 48  QT Interval:  254 QTC  Calculation: 343 R Axis:   -62 Text Interpretation:  Undetermined rhythm Left axis deviation Low voltage QRS Inferior-posterior infarct , age undetermined Anterolateral infarct , age undetermined Abnormal ECG Interpretation limited secondary to artifact Confirmed by Fredia Sorrow [phone]) on 07/24/2017 3:54:29 PM       Radiology Dg Chest 2 View  Result Date: 07/24/2017 CLINICAL DATA:  Chest pain EXAM: CHEST  2 VIEW COMPARISON:  Chest CT April 17, 2016 and chest radiograph March 30, 2016 FINDINGS: There is no edema or consolidation. Heart is upper normal in size with pulmonary vascularity within normal limits. No adenopathy. There are foci of degenerative change in the thoracic spine. No pneumothorax. IMPRESSION: No edema or consolidation Electronically Signed   By: Lowella Grip III M.D.   On: 07/24/2017 16:11    Procedures Procedures (including critical care time)  Medications Ordered in ED Medications - No data to display   Initial Impression / Assessment and Plan / ED Course  I have reviewed the triage vital signs and the nursing notes.  Pertinent labs & imaging results that were available during my care of the patient were reviewed by me and considered in my medical decision making (see chart for details).    Patient with worrisome chest pain fortunately it has resolved.  First troponin negative.  Chest x-ray without any acute findings.  Discussed with hospitalist discussed with cardiology at [facility].  Since we do not have cardiology coverage this weekend figured that patient would be admitted to the hospitalist service at [facility] so the cardiology consult.  However family did not want to go to [facility].  Hospitalist with Dr. Shanon Brow admitted the patient here.    Final Clinical Impressions(s) / ED Diagnoses   Final diagnoses:  Precordial pain    ED Discharge Orders    None       Fredia Sorrow, MD 07/25/17 [phone]

## 2017-07-24 NOTE — ED Notes (Signed)
Report to Christy, RN

## 2017-07-24 NOTE — ED Notes (Signed)
Pt and family member asks why pt cannot stay at this facility and see his cardiologist in Monday  Pt was very exasperated due to wait time to get back into a room and now is upset with need to await bed placement

## 2017-07-24 NOTE — ED Triage Notes (Addendum)
Pt states his chest pain started around 2:45 pm today. States it feels like chest tightness. Pt has been belching in triage. Rates pain as an 8/10.Pt states it feels better when family member presses in the center of his chest.   Family member given pt 1 nitro as well as 3 baby aspirin with relief

## 2017-07-24 NOTE — ED Notes (Signed)
Call for report Alyse Low, RN will call back

## 2017-07-25 ENCOUNTER — Observation Stay (HOSPITAL_COMMUNITY): Payer: Medicare Other

## 2017-07-25 DIAGNOSIS — M5441 Lumbago with sciatica, right side: Secondary | ICD-10-CM

## 2017-07-25 DIAGNOSIS — R079 Chest pain, unspecified: Secondary | ICD-10-CM | POA: Diagnosis not present

## 2017-07-25 DIAGNOSIS — I10 Essential (primary) hypertension: Secondary | ICD-10-CM | POA: Diagnosis not present

## 2017-07-25 DIAGNOSIS — R072 Precordial pain: Secondary | ICD-10-CM | POA: Diagnosis not present

## 2017-07-25 DIAGNOSIS — G8929 Other chronic pain: Secondary | ICD-10-CM | POA: Diagnosis not present

## 2017-07-25 LAB — TROPONIN I: Troponin I: <0.03 ng/mL (ref <0.03)

## 2017-07-25 LAB — GLUCOSE, CAPILLARY
Glucose-Capillary: 174 mg/dL — ABNORMAL HIGH (ref 65–99)
Glucose-Capillary: 213 mg/dL — ABNORMAL HIGH (ref 65–99)

## 2017-07-25 MED ORDER — PANTOPRAZOLE SODIUM 40 MG PO TBEC: 40.0000 mg | DELAYED_RELEASE_TABLET | Freq: Every day | ORAL | 0 refills | Status: DC

## 2017-07-25 MED ORDER — ORAL CARE MOUTH RINSE
15.0000 mL | Freq: Two times a day (BID) | OROMUCOSAL | Status: DC
  Administered 2017-07-25: 15 mL via OROMUCOSAL

## 2017-07-25 MED ORDER — POTASSIUM CHLORIDE CRYS ER 20 MEQ PO TBCR
40.0000 meq | EXTENDED_RELEASE_TABLET | Freq: Once | ORAL | Status: AC
  Administered 2017-07-25: 40 meq via ORAL
  Filled 2017-07-25: qty 2

## 2017-07-25 MED ORDER — PANTOPRAZOLE SODIUM 40 MG PO TBEC
40.0000 mg | DELAYED_RELEASE_TABLET | Freq: Every day | ORAL | Status: DC
  Administered 2017-07-25: 40 mg via ORAL
  Filled 2017-07-25: qty 1

## 2017-07-25 MED ORDER — CHLORHEXIDINE GLUCONATE 0.12 % MT SOLN
15.0000 mL | Freq: Two times a day (BID) | OROMUCOSAL | Status: DC
  Administered 2017-07-25: 15 mL via OROMUCOSAL
  Filled 2017-07-25: qty 15

## 2017-07-25 NOTE — Progress Notes (Signed)
Patient discharged home.  IV removed - WNL.  Reviewed DC instructions and medications.  Verbalizes understanding.  No questions at this time.  Assisted off unit via WC in NAD

## 2017-07-25 NOTE — Discharge Summary (Signed)
Physician Discharge Summary  Francisco Powell:[id number] DOB: August 25, 1940 DOA: 07/24/2017  PCP: [name], MD  Admit date: 07/24/2017 Discharge date: 07/25/2017  Admitted From: Home Disposition: Home  Recommendations for Outpatient Follow-up:  1. Follow up with PCP in 1-2 weeks 2. Please obtain BMP/CBC in one week  Discharge Condition: Stable CODE STATUS: Full code Diet recommendation: Heart Healthy   Brief/Interim Summary: 77 year old male with a history of coronary artery disease, hypertension, presents to the hospital with chest discomfort.  He felt it was related to dyspepsia and buildup of gas.  He noticed significant relief after belching.  He did take nitroglycerin at home without any benefit.  The patient was admitted to the hospital for observation.  He ruled out for ACS with negative cardiac markers.  EKG did not show any acute findings.  He did not have any recurrence of discomfort after belching.  The cause of his discomfort was likely significant dyspepsia.  Patient was started on Protonix and advised to take Tums/Maalox as needed.  He will be discharged home in stable condition.  Discharge Diagnoses:  Principal Problem:   Chest pain Active Problems:   Bifascicular block - RBBB, LAFB   Essential hypertension   Coronary atherosclerosis of native coronary artery   Chronic low back pain    Discharge Instructions  Discharge Instructions    Diet - low sodium heart healthy   Complete by:  As directed    Increase activity slowly   Complete by:  As directed      Allergies as of 07/25/2017      Reactions   Penicillins Hives, Itching, Swelling   Has patient had a PCN reaction causing immediate rash, facial/tongue/throat swelling, SOB or lightheadedness with hypotension: Unknown Has patient had a PCN reaction causing severe rash involving mucus membranes or skin necrosis: Unknown Has patient had a PCN reaction that required hospitalization: Unknown Has patient had a  PCN reaction occurring within the last 10 years: Unknown If all of the above answers are "NO", then may proceed with Cephalosporin use.   Other    Bee stings   Atorvastatin Other (See Comments)   Muscle pain/cramps   Celexa [citalopram] Diarrhea      Medication List    STOP taking these medications   doxycycline 100 MG tablet Commonly known as:  VIBRA-TABS     TAKE these medications   ALPRAZolam 1 MG tablet Commonly known as:  XANAX 0.5-1 tablet at bedtime as needed for sleep   amLODipine 10 MG tablet Commonly known as:  NORVASC TAKE (1) TABLET BY MOUTH ONCE DAILY.   ASPIRIN 81 81 MG chewable tablet Generic drug:  aspirin Chew 81 mg by mouth daily.   B-D ULTRAFINE III SHORT PEN 31G X 8 MM Misc Generic drug:  Insulin Pen Needle USE AS DIRECTED.   baclofen 20 MG tablet Commonly known as:  LIORESAL Take 20 mg by mouth 3 (three) times daily. Takes one tablet (1)daily   BIOFREEZE 4 % Gel Generic drug:  Menthol (Topical Analgesic) Apply Biofreeze to spine for pain management   carvedilol 25 MG tablet Commonly known as:  COREG TAKE (1) TABLET BY MOUTH TWICE DAILY.   DULoxetine 60 MG capsule Commonly known as:  CYMBALTA TAKE ONE CAPSULE BY MOUTH DAILY.   FARXIGA 10 MG Tabs tablet Generic drug:  dapagliflozin propanediol TAKE ONE TABLET BY MOUTH ONCE DAILY.   GLUCERNA Liqd Take 237 mLs by mouth 2 (two) times daily between meals.   LEVEMIR 100  UNIT/ML injection Generic drug:  insulin detemir INJECT 50 UNITS INTO THE SKIN TWICE DAILY. What changed:  See the new instructions.   lisinopril 20 MG tablet Commonly known as:  PRINIVIL,ZESTRIL TAKE 1 TABLET BY MOUTH ONCE A DAY.   Melatonin 10 MG Tabs Take 1 tablet by mouth at bedtime.   metFORMIN 1000 MG tablet Commonly known as:  GLUCOPHAGE TAKE 1 TABLET BY MOUTH TWICE DAILY WITH A MEAL. What changed:  See the new instructions.   nitroGLYCERIN 0.4 MG SL tablet Commonly known as:  NITROSTAT Place 1 tablet (0.4  mg total) under the tongue every 5 (five) minutes x 3 doses as needed for chest pain.   NOVOLOG FLEXPEN 100 UNIT/ML FlexPen Generic drug:  insulin aspart INJECT 25 UNITS INTO THE SKIN 3 TIMES DAILY.   OVER THE COUNTER MEDICATION Take 1 tablet by mouth as needed (constipation).   oxyCODONE-acetaminophen 10-325 MG tablet Commonly known as:  PERCOCET Take one tablet by mouth every 4 hours as needed for pain   pantoprazole 40 MG tablet Commonly known as:  PROTONIX Take 1 tablet (40 mg total) by mouth daily.   polyethylene glycol packet Commonly known as:  MIRALAX / GLYCOLAX Take 17 g by mouth daily as needed for mild constipation.   pravastatin 80 MG tablet Commonly known as:  PRAVACHOL TAKE 1 TABLET BY MOUTH AT BEDTIME FOR CHOLESTEROL.   traZODone 50 MG tablet Commonly known as:  DESYREL Take 0.5-1 tablets (25-50 mg total) by mouth at bedtime as needed for sleep.   TYLENOL PM EXTRA STRENGTH PO Take 1 tablet by mouth every 4 (four) hours as needed.       Allergies  Allergen Reactions  . Penicillins Hives, Itching and Swelling    Has patient had a PCN reaction causing immediate rash, facial/tongue/throat swelling, SOB or lightheadedness with hypotension: Unknown Has patient had a PCN reaction causing severe rash involving mucus membranes or skin necrosis: Unknown Has patient had a PCN reaction that required hospitalization: Unknown Has patient had a PCN reaction occurring within the last 10 years: Unknown If all of the above answers are "NO", then may proceed with Cephalosporin use.   . Other     Bee stings  . Atorvastatin Other (See Comments)    Muscle pain/cramps   . Celexa [Citalopram] Diarrhea    Consultations:     Procedures/Studies: Dg Chest 2 View  Result Date: 07/24/2017 CLINICAL DATA:  Chest pain EXAM: CHEST  2 VIEW COMPARISON:  Chest CT April 17, 2016 and chest radiograph March 30, 2016 FINDINGS: There is no edema or consolidation. Heart is upper  normal in size with pulmonary vascularity within normal limits. No adenopathy. There are foci of degenerative change in the thoracic spine. No pneumothorax. IMPRESSION: No edema or consolidation Electronically Signed   By: Lowella Grip III M.D.   On: 07/24/2017 16:11       Subjective: He is not had any further chest discomfort after he belched  Discharge Exam: Vitals:   07/24/17 2224 07/25/17 0500  BP: (!) 205/86 (!) 178/84  Pulse: 63 75  Resp: 18 18  Temp: 98.1 F (36.7 C) 98.3 F (36.8 C)  SpO2: 95% 94%   Vitals:   07/24/17 2049 07/24/17 2100 07/24/17 2224 07/25/17 0500  BP: (!) 188/98 (!) 180/103 (!) 205/86 (!) 178/84  Pulse: 71 72 63 75  Resp: 18  18 18   Temp:   98.1 F (36.7 C) 98.3 F (36.8 C)  TempSrc:   Oral Oral  SpO2: 93% 93% 95% 94%  Weight:   121.4 kg (267 lb 10.2 oz)   Height:   6\' 2"  (1.88 m)     General: Pt is alert, awake, not in acute distress Cardiovascular: RRR, S1/S2 +, no rubs, no gallops Respiratory: CTA bilaterally, no wheezing, no rhonchi Abdominal: Soft, NT, ND, bowel sounds + Extremities: no edema, no cyanosis    The results of significant diagnostics from this hospitalization (including imaging, microbiology, ancillary and laboratory) are listed below for reference.     Microbiology: No results found for this or any previous visit (from the past 240 hour(s)).   Labs: BNP (last 3 results) No results for input(s): BNP in the last 8760 hours. Basic Metabolic Panel: Recent Labs  Lab 07/24/17 1704  NA 143  K 3.2*  CL 102  CO2 27  GLUCOSE 134*  BUN 12  CREATININE 0.82  CALCIUM 9.3   Liver Function Tests: No results for input(s): AST, ALT, ALKPHOS, BILITOT, PROT, ALBUMIN in the last 168 hours. No results for input(s): LIPASE, AMYLASE in the last 168 hours. No results for input(s): AMMONIA in the last 168 hours. CBC: Recent Labs  Lab 07/24/17 1704  WBC 11.1*  HGB 13.7  HCT 46.3  MCV 85.6  PLT 281   Cardiac  Enzymes: Recent Labs  Lab 07/24/17 1704 07/25/17 0137 07/25/17 0530  TROPONINI <0.03 <0.03 <0.03   BNP: Invalid input(s): POCBNP CBG: Recent Labs  Lab 07/24/17 2250 07/25/17 0808 07/25/17 1226  GLUCAP 108* 174* 213*   D-Dimer No results for input(s): DDIMER in the last 72 hours. Hgb A1c No results for input(s): HGBA1C in the last 72 hours. Lipid Profile No results for input(s): CHOL, HDL, LDLCALC, TRIG, CHOLHDL, LDLDIRECT in the last 72 hours. Thyroid function studies No results for input(s): TSH, T4TOTAL, T3FREE, THYROIDAB in the last 72 hours.  Invalid input(s): FREET3 Anemia work up No results for input(s): VITAMINB12, FOLATE, FERRITIN, TIBC, IRON, RETICCTPCT in the last 72 hours. Urinalysis    Component Value Date/Time   COLORURINE YELLOW 07/24/2017 1948   APPEARANCEUR CLEAR 07/24/2017 1948   LABSPEC 1.023 07/24/2017 1948   PHURINE 5.0 07/24/2017 1948   GLUCOSEU >=500 (A) 07/24/2017 1948   HGBUR NEGATIVE 07/24/2017 1948   BILIRUBINUR NEGATIVE 07/24/2017 1948   KETONESUR 5 (A) 07/24/2017 1948   PROTEINUR 100 (A) 07/24/2017 1948   UROBILINOGEN 0.2 10/28/2008 0024   NITRITE NEGATIVE 07/24/2017 1948   LEUKOCYTESUR NEGATIVE 07/24/2017 1948   Sepsis Labs Invalid input(s): PROCALCITONIN,  WBC,  LACTICIDVEN Microbiology No results found for this or any previous visit (from the past 240 hour(s)).   Time coordinating discharge: Over 30 minutes  SIGNED:   Kathie Dike, MD  [facility] 07/25/2017, 7:20 PM Pager   If 7PM-7AM, please contact night-coverage [email or web address] Password [email or web address]

## 2017-07-25 NOTE — Progress Notes (Signed)
PHARMACIST - PHYSICIAN ORDER COMMUNICATION  CONCERNING: P&T Medication Policy on Herbal Medications  DESCRIPTION:  This patient's order for:  melatonin has been noted.  This product(s) is classified as an "herbal" or natural product. Due to a lack of definitive safety studies or FDA approval, nonstandard manufacturing practices, plus the potential risk of unknown drug-drug interactions while on inpatient medications, the Pharmacy and Therapeutics Committee does not permit the use of "herbal" or natural products of this type within [facility].   ACTION TAKEN: The pharmacy department is unable to verify this order at this time and your patient has been informed of this safety policy. Please reevaluate patient's clinical condition at discharge and address if the herbal or natural product(s) should be resumed at that time.  Isac Sarna, BS Vena [place], BCPS Clinical Pharmacist Pager [phone]

## 2017-07-25 NOTE — Discharge Instructions (Signed)
Nonspecific Chest Pain °Chest pain can be caused by many different conditions. There is always a chance that your pain could be related to something serious, such as a heart attack or a blood clot in your lungs. Chest pain can also be caused by conditions that are not life-threatening. If you have chest pain, it is very important to follow up with your health care provider. °What are the causes? °Causes of this condition include: °· Heartburn. °· Pneumonia or bronchitis. °· Anxiety or stress. °· Inflammation around your heart (pericarditis) or lung (pleuritis or pleurisy). °· A blood clot in your lung. °· A collapsed lung (pneumothorax). This can develop suddenly on its own (spontaneous pneumothorax) or from trauma to the chest. °· Shingles infection (varicella-zoster virus). °· Heart attack. °· Damage to the bones, muscles, and cartilage that make up your chest wall. This can include: °? Bruised bones due to injury. °? Strained muscles or cartilage due to frequent or repeated coughing or overwork. °? Fracture to one or more ribs. °? Sore cartilage due to inflammation (costochondritis). ° °What increases the risk? °Risk factors for this condition may include: °· Activities that increase your risk for trauma or injury to your chest. °· Respiratory infections or conditions that cause frequent coughing. °· Medical conditions or overeating that can cause heartburn. °· Heart disease or family history of heart disease. °· Conditions or health behaviors that increase your risk of developing a blood clot. °· Having had chicken pox (varicella zoster). ° °What are the signs or symptoms? °Chest pain can feel like: °· Burning or tingling on the surface of your chest or deep in your chest. °· Crushing, pressure, aching, or squeezing pain. °· Dull or sharp pain that is worse when you move, cough, or take a deep breath. °· Pain that is also felt in your back, neck, shoulder, or arm, or pain that spreads to any of these  areas. ° °Your chest pain may come and go, or it may stay constant. °How is this diagnosed? °Lab tests or other studies may be needed to find the cause of your pain. Your health care provider may have you take a test called an ECG (electrocardiogram). An ECG records your heartbeat patterns at the time the test is performed. You may also have other tests, such as: °· Transthoracic echocardiogram (TTE). In this test, sound waves are used to create a picture of the heart structures and to look at how blood flows through your heart. °· Transesophageal echocardiogram (TEE). This is a more advanced imaging test that takes images from inside your body. It allows your health care provider to see your heart in finer detail. °· Cardiac monitoring. This allows your health care provider to monitor your heart rate and rhythm in real time. °· Holter monitor. This is a portable device that records your heartbeat and can help to diagnose abnormal heartbeats. It allows your health care provider to track your heart activity for several days, if needed. °· Stress tests. These can be done through exercise or by taking medicine that makes your heart beat more quickly. °· Blood tests. °· Other imaging tests. ° °How is this treated? °Treatment depends on what is causing your chest pain. Treatment may include: °· Medicines. These may include: °? Acid blockers for heartburn. °? Anti-inflammatory medicine. °? Pain medicine for inflammatory conditions. °? Antibiotic medicine, if an infection is present. °? Medicines to dissolve blood clots. °? Medicines to treat coronary artery disease (CAD). °· Supportive care for conditions that   do not require medicines. This may include: °? Resting. °? Applying heat or cold packs to injured areas. °? Limiting activities until pain decreases. ° °Follow these instructions at home: °Medicines °· If you were prescribed an antibiotic, take it as told by your health care provider. Do not stop taking the  antibiotic even if you start to feel better. °· Take over-the-counter and prescription medicines only as told by your health care provider. °Lifestyle °· Do not use any products that contain nicotine or tobacco, such as cigarettes and e-cigarettes. If you need help quitting, ask your health care provider. °· Do not drink alcohol. °· Make lifestyle changes as directed by your health care provider. These may include: °? Getting regular exercise. Ask your health care provider to suggest some activities that are safe for you. °? Eating a heart-healthy diet. A registered dietitian can help you to learn healthy eating options. °? Maintaining a healthy weight. °? Managing diabetes, if necessary. °? Reducing stress, such as with yoga or relaxation techniques. °General instructions °· Avoid any activities that bring on chest pain. °· If heartburn is the cause for your chest pain, raise (elevate) the head of your bed about 6 inches (15 cm) by putting blocks under the legs. Sleeping with more pillows does not effectively relieve heartburn because it only changes the position of your head. °· Keep all follow-up visits as told by your health care provider. This is important. This includes any further testing if your chest pain does not go away. °Contact a health care provider if: °· Your chest pain does not go away. °· You have a rash with blisters on your chest. °· You have a fever. °· You have chills. °Get help right away if: °· Your chest pain is worse. °· You have a cough that gets worse, or you cough up blood. °· You have severe pain in your abdomen. °· You have severe weakness. °· You faint. °· You have sudden, unexplained chest discomfort. °· You have sudden, unexplained discomfort in your arms, back, neck, or jaw. °· You have shortness of breath at any time. °· You suddenly start to sweat, or your skin gets clammy. °· You feel nauseous or you vomit. °· You suddenly feel light-headed or dizzy. °· Your heart begins to beat  quickly, or it feels like it is skipping beats. °These symptoms may represent a serious problem that is an emergency. Do not wait to see if the symptoms will go away. Get medical help right away. Call your local emergency services (911 in the [place] Do not drive yourself to the hospital. °This information is not intended to replace advice given to you by your health care provider. Make sure you discuss any questions you have with your health care provider. °Document Released: 03/11/2005 Document Revised: 02/24/2016 Document Reviewed: 02/24/2016 °Elsevier Interactive Patient Education © 2017 [employer] ° °

## 2017-07-26 ENCOUNTER — Telehealth: Payer: Self-pay | Admitting: Family Medicine

## 2017-07-26 ENCOUNTER — Other Ambulatory Visit: Payer: Self-pay | Admitting: Family Medicine

## 2017-07-26 NOTE — Telephone Encounter (Signed)
Nurse's-patient recently discharged from the hospital. Please call patient, let them know that we are aware that they were discharged from the hospital. Please schedule them to follow-up with us within the next 14 days. Advised the patient to bring all of their medications with him to the visit. Please inquire if they are having any acute issues currently and documented accordingly.  

## 2017-07-26 NOTE — Telephone Encounter (Addendum)
Patient scheduled follow up office visit for ER follow up 08/04/17 with Dr Nicki Reaper

## 2017-07-26 NOTE — Telephone Encounter (Signed)
Telephone call no answer 

## 2017-08-04 ENCOUNTER — Encounter: Payer: Self-pay | Admitting: Family Medicine

## 2017-08-04 ENCOUNTER — Ambulatory Visit (INDEPENDENT_AMBULATORY_CARE_PROVIDER_SITE_OTHER): Payer: Medicare Other | Admitting: Family Medicine

## 2017-08-04 VITALS — BP 132/86 | Ht 74.0 in | Wt 286.0 lb

## 2017-08-04 DIAGNOSIS — E876 Hypokalemia: Secondary | ICD-10-CM | POA: Diagnosis not present

## 2017-08-04 DIAGNOSIS — IMO0001 Reserved for inherently not codable concepts without codable children: Secondary | ICD-10-CM

## 2017-08-04 DIAGNOSIS — Z794 Long term (current) use of insulin: Secondary | ICD-10-CM

## 2017-08-04 DIAGNOSIS — D72829 Elevated white blood cell count, unspecified: Secondary | ICD-10-CM | POA: Diagnosis not present

## 2017-08-04 DIAGNOSIS — R0789 Other chest pain: Secondary | ICD-10-CM

## 2017-08-04 DIAGNOSIS — E119 Type 2 diabetes mellitus without complications: Secondary | ICD-10-CM | POA: Diagnosis not present

## 2017-08-04 DIAGNOSIS — E7849 Other hyperlipidemia: Secondary | ICD-10-CM | POA: Diagnosis not present

## 2017-08-04 MED ORDER — LISINOPRIL 40 MG PO TABS: 40.0000 mg | ORAL_TABLET | Freq: Every day | ORAL | 5 refills | Status: DC

## 2017-08-04 MED ORDER — CARVEDILOL 25 MG PO TABS: ORAL_TABLET | ORAL | 5 refills | Status: DC

## 2017-08-04 MED ORDER — METFORMIN HCL 1000 MG PO TABS: ORAL_TABLET | ORAL | 1 refills | Status: DC

## 2017-08-04 MED ORDER — AMLODIPINE BESYLATE 10 MG PO TABS: ORAL_TABLET | ORAL | 2 refills | Status: DC

## 2017-08-04 MED ORDER — DAPAGLIFLOZIN PROPANEDIOL 10 MG PO TABS: 10.0000 mg | ORAL_TABLET | Freq: Every day | ORAL | 5 refills | Status: DC

## 2017-08-04 MED ORDER — PRAVASTATIN SODIUM 80 MG PO TABS: ORAL_TABLET | ORAL | 1 refills | Status: DC

## 2017-08-04 MED ORDER — LIDOCAINE HCL 2 % EX GEL: 1.0000 "application " | CUTANEOUS | 2 refills | Status: DC | PRN

## 2017-08-04 MED ORDER — NITROGLYCERIN 0.4 MG SL SUBL: 0.4000 mg | SUBLINGUAL_TABLET | SUBLINGUAL | 4 refills | Status: DC | PRN

## 2017-08-04 NOTE — Progress Notes (Signed)
   Subjective:    Patient ID: Francisco Powell, male    DOB: 11/04/1940, 77 y.o.   MRN: [id number]  HPI Patient arrives for a follow up from a recent hospitalization for chest pain- Cardiac was ruled out -it was gas pain per patient. Patient recently in the hospital with chest pain discomfort after his stay in the hospital he was able to belch and when he did the pain went away so he does not feel he was having heart disease problems he does have a history of stents and does have significant risk factors for heart disease and would not get classic angina because he is not able to be physically active.  Review of Systems  Constitutional: Negative for activity change, fatigue and fever.  HENT: Negative for congestion and rhinorrhea.   Respiratory: Negative for cough and shortness of breath.   Cardiovascular: Negative for chest pain and leg swelling.  Gastrointestinal: Negative for abdominal pain, diarrhea and nausea.  Genitourinary: Negative for dysuria and hematuria.  Neurological: Negative for weakness and headaches.  Psychiatric/Behavioral: Negative for behavioral problems.       Objective:   Physical Exam  Constitutional: He appears well-nourished. No distress.  HENT:  Head: Normocephalic and atraumatic.  Eyes: Right eye exhibits no discharge. Left eye exhibits no discharge.  Neck: No tracheal deviation present.  Cardiovascular: Normal rate, regular rhythm and normal heart sounds.  No murmur heard. Pulmonary/Chest: Effort normal and breath sounds normal. No respiratory distress. He has no wheezes.  Musculoskeletal: He exhibits no edema.  Lymphadenopathy:    He has no cervical adenopathy.  Neurological: He is alert.  Skin: Skin is warm. No rash noted.  Psychiatric: His behavior is normal.  Vitals reviewed.  Hospital records as well as lab work was reviewed with the patient       Assessment & Plan:  Patient will do comprehensive lab work before his next visit regarding his  kidney function diabetes hyperlipidemia and leukocytosis he will follow-up in April for this  Chest pain resolved possibility of underlying heart disease with his risk factors it would benefit the patient to be seen by cardiology he has an appointment coming up more than likely will need to have a Myoview

## 2017-08-06 ENCOUNTER — Telehealth: Payer: Self-pay | Admitting: *Deleted

## 2017-08-06 NOTE — Telephone Encounter (Signed)
Lidocaine jelly approved by insurance via cover my meds- Pharmacy notified.

## 2017-08-18 DIAGNOSIS — Z794 Long term (current) use of insulin: Secondary | ICD-10-CM | POA: Diagnosis not present

## 2017-08-18 DIAGNOSIS — D72829 Elevated white blood cell count, unspecified: Secondary | ICD-10-CM | POA: Diagnosis not present

## 2017-08-18 DIAGNOSIS — E119 Type 2 diabetes mellitus without complications: Secondary | ICD-10-CM | POA: Diagnosis not present

## 2017-08-18 DIAGNOSIS — E7849 Other hyperlipidemia: Secondary | ICD-10-CM | POA: Diagnosis not present

## 2017-08-18 DIAGNOSIS — E876 Hypokalemia: Secondary | ICD-10-CM | POA: Diagnosis not present

## 2017-08-19 LAB — LIPID PANEL
CHOL/HDL RATIO: 3.5 ratio (ref 0.0–5.0)
Cholesterol, Total: 138 mg/dL (ref 100–199)
HDL: 39 mg/dL — ABNORMAL LOW (ref >39)
LDL CALC: 60 mg/dL (ref 0–99)
Triglycerides: 193 mg/dL — ABNORMAL HIGH (ref 0–149)
VLDL CHOLESTEROL CAL: 39 mg/dL (ref 5–40)

## 2017-08-19 LAB — CBC WITH DIFFERENTIAL/PLATELET
Basophils Absolute: 0.1 10*3/uL (ref 0.0–0.2)
Basos: 1 %
EOS (ABSOLUTE): 0.5 10*3/uL — AB (ref 0.0–0.4)
Eos: 5 %
Hematocrit: 42.9 % (ref 37.5–51.0)
Hemoglobin: 13.6 g/dL (ref 13.0–17.7)
IMMATURE GRANULOCYTES: 1 %
Immature Grans (Abs): 0.1 10*3/uL (ref 0.0–0.1)
Lymphocytes Absolute: 2.2 10*3/uL (ref 0.7–3.1)
Lymphs: 23 %
MCH: 25.8 pg — AB (ref 26.6–33.0)
MCHC: 31.7 g/dL (ref 31.5–35.7)
MCV: 81 fL (ref 79–97)
MONOS ABS: 1 10*3/uL — AB (ref 0.1–0.9)
Monocytes: 10 %
NEUTROS ABS: 5.8 10*3/uL (ref 1.4–7.0)
NEUTROS PCT: 60 %
PLATELETS: 271 10*3/uL (ref 150–379)
RBC: 5.28 x10E6/uL (ref 4.14–5.80)
RDW: 17.9 % — AB (ref 12.3–15.4)
WBC: 9.5 10*3/uL (ref 3.4–10.8)

## 2017-08-19 LAB — HEPATIC FUNCTION PANEL
ALBUMIN: 4.5 g/dL (ref 3.5–4.8)
ALK PHOS: 69 IU/L (ref 39–117)
ALT: 15 IU/L (ref 0–44)
AST: 17 IU/L (ref 0–40)
Bilirubin Total: 0.3 mg/dL (ref 0.0–1.2)
Bilirubin, Direct: 0.13 mg/dL (ref 0.00–0.40)
TOTAL PROTEIN: 7.5 g/dL (ref 6.0–8.5)

## 2017-08-19 LAB — BASIC METABOLIC PANEL
BUN / CREAT RATIO: 13 (ref 10–24)
BUN: 12 mg/dL (ref 8–27)
CHLORIDE: 100 mmol/L (ref 96–106)
CO2: 28 mmol/L (ref 20–29)
Calcium: 9.7 mg/dL (ref 8.6–10.2)
Creatinine, Ser: 0.93 mg/dL (ref 0.76–1.27)
GFR, EST AFRICAN AMERICAN: 93 mL/min/{1.73_m2} (ref >59)
GFR, EST NON AFRICAN AMERICAN: 81 mL/min/{1.73_m2} (ref >59)
Glucose: 220 mg/dL — ABNORMAL HIGH (ref 65–99)
Potassium: 4.2 mmol/L (ref 3.5–5.2)
Sodium: 146 mmol/L — ABNORMAL HIGH (ref 134–144)

## 2017-08-19 LAB — MICROALBUMIN / CREATININE URINE RATIO
Creatinine, Urine: 44 mg/dL
MICROALB/CREAT RATIO: 1476.1 mg/g{creat} — AB (ref 0.0–30.0)
Microalbumin, Urine: 649.5 ug/mL

## 2017-08-23 ENCOUNTER — Ambulatory Visit (INDEPENDENT_AMBULATORY_CARE_PROVIDER_SITE_OTHER): Payer: Medicare Other | Admitting: Cardiology

## 2017-08-23 ENCOUNTER — Encounter: Payer: Self-pay | Admitting: Cardiology

## 2017-08-23 VITALS — BP 164/80 | HR 63 | Ht 74.0 in | Wt 269.8 lb

## 2017-08-23 DIAGNOSIS — I10 Essential (primary) hypertension: Secondary | ICD-10-CM

## 2017-08-23 DIAGNOSIS — E782 Mixed hyperlipidemia: Secondary | ICD-10-CM

## 2017-08-23 DIAGNOSIS — E118 Type 2 diabetes mellitus with unspecified complications: Secondary | ICD-10-CM | POA: Diagnosis not present

## 2017-08-23 DIAGNOSIS — Z794 Long term (current) use of insulin: Secondary | ICD-10-CM | POA: Diagnosis not present

## 2017-08-23 DIAGNOSIS — I25119 Atherosclerotic heart disease of native coronary artery with unspecified angina pectoris: Secondary | ICD-10-CM

## 2017-08-23 NOTE — Patient Instructions (Addendum)
Your physician wants you to follow-up in:6 months with Dr.McDowell You will receive a reminder letter in the mail two months in advance. If you don't receive a letter, please call our office to schedule the follow-up appointment.   Your physician recommends that you continue on your current medications as directed. Please refer to the Current Medication list given to you today.   If you need a refill on your cardiac medications before your next appointment, please call your pharmacy.    No lab work or tests ordered today.      Thank you for choosing [facility] !         

## 2017-08-23 NOTE — Progress Notes (Signed)
Cardiology Office Note  Date: 08/23/2017   ID: Francisco Powell, Francisco Powell [birth year], MRN [id number]  PCP: [name], MD  Primary Cardiologist: Rozann Lesches, MD   Chief Complaint  Patient presents with  . Coronary Artery Disease    History of Present Illness: Francisco Powell is a 77 y.o. male that I last saw in March 2017.  Records indicate recent hospitalization at [facility] in February after episode of chest discomfort associated with dyspepsia and gas.  Reportedly improved after belching.  He was observed with negative cardiac markers and started on Protonix by the hospitalist team.  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  Overall feels better, has had no recurring chest discomfort.  He describes the episode today which does sound very much GI in etiology.  We went over his current medications.  Regimen includes aspirin, Norvasc, Coreg, lisinopril, as needed nitroglycerin, and Pravachol.  Reviewed his recent ECG as noted below.  Most recent coronary intervention was DES to the RCA and LAD in May 2015.  We have discussed continuing with observation for now in the absence of more definitive angina symptoms.  Past Medical History:  Diagnosis Date  . Anemia, iron deficiency   . Anxiety   . Asbestosis(501)   . Coronary atherosclerosis of native coronary artery    DES RCA and staged DES LAD May 2015, LVEF 55%  . Essential hypertension, benign   . Gout   . Hx of adenomatous colonic polyps   . Hyperlipidemia   . Kidney carcinoma ([place]) 1998  . Lumbar back pain    Spondylosis and spondylolisthesis   . Myocardial infarction ([place]) 2004  . Nephrolithiasis   . NSTEMI (non-ST elevated myocardial infarction) [facility])    May 2015  . Obesity   . Sciatica of right side   . Type II diabetes mellitus ([place])     Past Surgical History:  Procedure Laterality Date  . APPENDECTOMY    . BACK SURGERY  X 4  . CHOLECYSTECTOMY     Biliary dyskinesia  . COLONOSCOPY  2009   Dr. Arnoldo Morale: normal  . COLONOSCOPY  June 2011   Dr. Gala Romney: normal rectum, left-sided diverticula  . COLONOSCOPY N/A 04/12/2015   Procedure: COLONOSCOPY;  Surgeon: [name], MD;  Location: [facility];  Service: Endoscopy;  Laterality: N/A;  1115   . CYSTOSCOPY W/ LITHOLAPAXY / EHL  2009  . ESOPHAGOGASTRODUODENOSCOPY  June 2011   Dr. Gala Romney: erosive reflux esophagitis, small hiatal hernia, +H.pylori gastritis  . EYE SURGERY    . HOLMIUM LASER APPLICATION Right 5631  . LEFT HEART CATHETERIZATION WITH CORONARY ANGIOGRAM N/A 11/09/2013   Procedure: LEFT HEART CATHETERIZATION WITH CORONARY ANGIOGRAM;  Surgeon: [name], MD;  Location: [facility] CATH LAB;  Service: Cardiovascular;  Laterality: N/A;  . LUMBAR DISC SURGERY  X 3  . PERCUTANEOUS CORONARY STENT INTERVENTION (PCI-S) N/A 11/10/2013   Procedure: PERCUTANEOUS CORONARY STENT INTERVENTION (PCI-S);  Surgeon: [name], MD;  Location: [facility] CATH LAB;  Service: Cardiovascular;  Laterality: N/A;  . POSTERIOR LUMBAR FUSION  2014  . RETINAL DETACHMENT SURGERY Right 1998   Secondary to injury detached retina/cataracts  . TONSILLECTOMY    . TUMOR EXCISION  1995    Current Outpatient Medications  Medication Sig Dispense Refill  . ALPRAZolam (XANAX) 1 MG tablet 0.5-1 tablet at bedtime as needed for sleep 30 tablet 5  . amLODipine (NORVASC) 10 MG tablet TAKE (1) TABLET BY MOUTH  ONCE DAILY. 90 tablet 2  . aspirin (ASPIRIN 81) 81 MG chewable tablet Chew 81 mg by mouth daily.    . B-D ULTRAFINE III SHORT PEN 31G X 8 MM MISC USE AS DIRECTED. 100 each 5  . carvedilol (COREG) 25 MG tablet TAKE (1) TABLET BY MOUTH TWICE DAILY. 60 tablet 5  . dapagliflozin propanediol (FARXIGA) 10 MG TABS tablet Take 10 mg by mouth daily. 30 tablet 5  . Diphenhydramine-APAP, sleep, (TYLENOL PM EXTRA STRENGTH PO) Take 1 tablet by mouth every 4 (four) hours as needed.    . DULoxetine (CYMBALTA) 60 MG capsule TAKE ONE CAPSULE BY MOUTH DAILY. 30 capsule 5  .  GLUCERNA (GLUCERNA) LIQD Take 237 mLs by mouth 2 (two) times daily between meals.    Marland Kitchen LEVEMIR 100 UNIT/ML injection INJECT 50 UNITS INTO THE SKIN TWICE DAILY. (Patient taking differently: INJECT 55 UNITS INTO THE SKIN TWICE DAILY.) 20 mL 5  . lidocaine (XYLOCAINE) 2 % jelly Apply 1 application topically as needed. 30 mL 2  . lisinopril (PRINIVIL,ZESTRIL) 40 MG tablet Take 1 tablet (40 mg total) by mouth daily. 30 tablet 5  . Melatonin 10 MG TABS Take 1 tablet by mouth at bedtime.    . Menthol, Topical Analgesic, (BIOFREEZE) 4 % GEL Apply Biofreeze to spine for pain management    . metFORMIN (GLUCOPHAGE) 1000 MG tablet TAKE 1 TABLET BY MOUTH TWICE DAILY WITH A MEAL. 180 tablet 1  . nitroGLYCERIN (NITROSTAT) 0.4 MG SL tablet Place 1 tablet (0.4 mg total) under the tongue every 5 (five) minutes x 3 doses as needed for chest pain. 25 tablet 4  . NOVOLOG FLEXPEN 100 UNIT/ML FlexPen INJECT 25 UNITS INTO THE SKIN 3 TIMES DAILY. 15 mL 5  . OVER THE COUNTER MEDICATION Take 1 tablet by mouth as needed (constipation).    Marland Kitchen oxyCODONE-acetaminophen (PERCOCET) 10-325 MG tablet Take one tablet by mouth every 4 hours as needed for pain 180 tablet 0  . polyethylene glycol (MIRALAX / GLYCOLAX) packet Take 17 g by mouth daily as needed for mild constipation. 14 each 0  . pravastatin (PRAVACHOL) 80 MG tablet TAKE 1 TABLET BY MOUTH AT BEDTIME FOR CHOLESTEROL. 90 tablet 1   No current facility-administered medications for this visit.    Allergies:  Penicillins; Other; Atorvastatin; and Celexa [citalopram]   Social History: The patient  reports that  has never smoked. His smokeless tobacco use includes chew. He reports that he does not drink alcohol or use drugs.   ROS:  Please see the history of present illness. Otherwise, complete review of systems is positive for gait instability, uses a walker.  All other systems are reviewed and negative.   Physical Exam: VS:  BP (!) 164/80   Pulse 63   Ht 6\' 2"  (1.88 m)    Wt 269 lb 12.8 oz (122.4 kg)   SpO2 96% Comment: on room air  BMI 34.64 kg/m , BMI Body mass index is 34.64 kg/m.  Wt Readings from Last 3 Encounters:  08/23/17 269 lb 12.8 oz (122.4 kg)  08/04/17 286 lb (129.7 kg)  07/24/17 267 lb 10.2 oz (121.4 kg)    General: Morbidly obese male seated in wheelchair. Appears comfortable at rest. HEENT: Conjunctiva and lids normal, oropharynx clear. Neck: Supple, increased girth without obvious elevated JVP or carotid bruits, no thyromegaly. Lungs: Clear to auscultation, nonlabored breathing at rest. Cardiac: Regular rate and rhythm, no S3, 2/6 systolic murmur, no pericardial rub. Abdomen: Soft, nontender, bowel sounds present.  Extremities: Mild chronic appearing lower leg edema, distal pulses 1-2+. Skin: Warm and dry. Musculoskeletal: No kyphosis. Neuropsychiatric: Alert and oriented x3, affect grossly appropriate.  ECG: I personally reviewed the tracing from 07/24/2017 which showed sinus rhythm with incomplete right bundle branch block, left anterior fascicular block, nonspecific ST-T changes.  Recent Labwork: 08/18/2017: ALT 15; AST 17; BUN 12; Creatinine, Ser 0.93; Hemoglobin 13.6; Platelets 271; Potassium 4.2; Sodium 146     Component Value Date/Time   CHOL 138 08/18/2017 0815   TRIG 193 (H) 08/18/2017 0815   HDL 39 (L) 08/18/2017 0815   CHOLHDL 3.5 08/18/2017 0815   CHOLHDL 4.5 02/21/2014 0745   VLDL 55 (H) 02/21/2014 0745   LDLCALC 60 08/18/2017 0815    Other Studies Reviewed Today:  Lexiscan Myoview 09/04/2011: IMPRESSION: Abnormal Lexiscan Myoview as outlined in patient with reported history of CAD. There were no clearly diagnostic ST-segment abnormalities with right bundle branch block and left anterior fascicular block noted at baseline. Perfusion imaging shows evidence of soft tissue attenuation versus scar in the inferolateral wall, no definite ischemia however. LVEF is calculated at 42% with relatively diffuse  hypokinesis.  Echocardiogram 09/09/2011: Study Conclusions  - Left ventricle: The cavity size was at the upper limits of normal. Wall thickness was increased in a pattern of mild LVH. Systolic function was normal. The estimated ejection fraction was in the range of 60% to 65%. Wall motion was normal; there were no regional wall motion abnormalities. Doppler parameters are consistent with abnormal left ventricular relaxation (grade 1 diastolic dysfunction). - Ventricular septum: Septal motion showed paradox. - Aorta: Aortic root dimension: 53mm (ED). - Aortic root: The aortic root was mildly dilated. - Mitral valve: Trivial regurgitation. - Left atrium: The atrium was mildly dilated. - Tricuspid valve: Mild regurgitation. - Pericardium, extracardiac: There was no pericardial effusion.  Assessment and Plan:  1.  CAD with history of DES to the RCA and DES to the LAD in 2015.  He reports no obvious angina symptoms on medical therapy and had recent hospital observation with chest discomfort most likely of GI etiology.  Cardiac markers were negative and his ECG is overall nonspecific.  Plan to continue observation and reestablish more regular follow-up.  2.  Essential hypertension, continue with current regimen and keep follow-up with Dr. Wolfgang Phoenix.  Lisinopril dose was increased most recently.  3.  Hyperlipidemia, on Pravachol.  Recent LDL 60.  4.  Type 2 diabetes mellitus followed by Dr. Wolfgang Phoenix.  Last hemoglobin A1c 6.8.  Current medicines were reviewed with the patient today.  Disposition: Follow-up in 6 months.  Signed, [name], MD, [facility] 08/23/2017 2:48 PM    [place] at [facility] [address] [place], [place] [address] Phone: [phone]; Fax: [phone]

## 2017-08-31 ENCOUNTER — Other Ambulatory Visit: Payer: Self-pay | Admitting: *Deleted

## 2017-08-31 MED ORDER — LISINOPRIL 40 MG PO TABS: 40.0000 mg | ORAL_TABLET | Freq: Every day | ORAL | 1 refills | Status: DC

## 2017-09-21 ENCOUNTER — Ambulatory Visit: Payer: Medicare Other | Admitting: Family Medicine

## 2017-09-28 ENCOUNTER — Ambulatory Visit (INDEPENDENT_AMBULATORY_CARE_PROVIDER_SITE_OTHER): Payer: Medicare Other | Admitting: Family Medicine

## 2017-09-28 ENCOUNTER — Encounter: Payer: Self-pay | Admitting: Family Medicine

## 2017-09-28 VITALS — BP 134/84 | Ht 74.0 in

## 2017-09-28 DIAGNOSIS — Z794 Long term (current) use of insulin: Secondary | ICD-10-CM | POA: Diagnosis not present

## 2017-09-28 DIAGNOSIS — I25119 Atherosclerotic heart disease of native coronary artery with unspecified angina pectoris: Secondary | ICD-10-CM | POA: Diagnosis not present

## 2017-09-28 DIAGNOSIS — E7849 Other hyperlipidemia: Secondary | ICD-10-CM | POA: Diagnosis not present

## 2017-09-28 DIAGNOSIS — E119 Type 2 diabetes mellitus without complications: Secondary | ICD-10-CM | POA: Diagnosis not present

## 2017-09-28 DIAGNOSIS — I10 Essential (primary) hypertension: Secondary | ICD-10-CM

## 2017-09-28 DIAGNOSIS — K6289 Other specified diseases of anus and rectum: Secondary | ICD-10-CM | POA: Diagnosis not present

## 2017-09-28 DIAGNOSIS — IMO0001 Reserved for inherently not codable concepts without codable children: Secondary | ICD-10-CM

## 2017-09-28 LAB — POCT GLYCOSYLATED HEMOGLOBIN (HGB A1C): Hemoglobin A1C: 5.9

## 2017-09-28 NOTE — Progress Notes (Signed)
Subjective:    Patient ID: Francisco Powell, male    DOB: 25-Mar-1941, 77 y.o.   MRN: [id number]  Diabetes  He presents for his follow-up diabetic visit. He has type 2 diabetes mellitus. There are no hypoglycemic associated symptoms. Pertinent negatives for hypoglycemia include no confusion or headaches. There are no diabetic associated symptoms. Pertinent negatives for diabetes include no chest pain, no fatigue, no polydipsia, no polyphagia and no weakness. There are no hypoglycemic complications. There are no diabetic complications.   Results for orders placed or performed in visit on 09/28/17  POCT HgB A1C  Result Value Ref Range   Hemoglobin A1C 5.9   I reviewed over the lab work from March.  I do not feel additional lab work necessary  Patient uses Xanax mainly in the evenings and to help him sleep denies abusing it  Patient states he rarely uses pain medicine any longer and states he tolerates his back he does have a lot of upper body weakness as well as difficulty with walking uses a walker to get around any long distances uses a wheelchair currently family is able to care for him but it is very challenging  Patient for blood pressure check up. Patient relates compliance with meds. Todays BP reviewed with the patient. Patient denies issues with medication. Patient relates reasonable diet. Patient tries to minimize salt. Patient aware of BP goals.  Patient here for follow-up regarding cholesterol.  Patient does try to maintain a reasonable diet.  Patient does take the medication on a regular basis.  Denies missing a dose.  The patient denies any obvious side effects.  Prior blood work results reviewed with the patient.  The patient is aware of his cholesterol goals and the need to keep it under good control to lessen the risk of disease.  Patient suffers with insomnia.  This is been going on for a while.  The patient finds it necessary to use medication to help sleep.  Patient finds it if  not using medication has significant troubles.  Denies abusing the medication.  Denies any negative side effects.  Depression with the loss of his wife 2 years ago but he is adjusting tolerating the medicine well   is patient is here today regarding follow-up regarding depression.  Patient relates compliance with medication.  Patient understands importance of taking the medication.  Patient denies any significant side effects.  Patient relates that the medication is still beneficial for them and they would like to continue the medication.  Patient is not having any threats of homicide or suicide.    Review of Systems  Constitutional: Negative for activity change, appetite change and fatigue.  HENT: Negative for congestion and rhinorrhea.   Respiratory: Negative for cough, chest tightness and shortness of breath.   Cardiovascular: Negative for chest pain and leg swelling.  Gastrointestinal: Negative for abdominal pain, diarrhea and nausea.  Endocrine: Negative for polydipsia and polyphagia.  Genitourinary: Negative for dysuria and hematuria.  Neurological: Negative for weakness and headaches.  Psychiatric/Behavioral: Negative for confusion and dysphoric mood.       Objective:   Physical Exam  Constitutional: He appears well-nourished. No distress.  HENT:  Head: Normocephalic and atraumatic.  Eyes: Right eye exhibits no discharge. Left eye exhibits no discharge.  Neck: No tracheal deviation present.  Cardiovascular: Normal rate, regular rhythm and normal heart sounds.  No murmur heard. Pulmonary/Chest: Effort normal and breath sounds normal. No respiratory distress. He has no wheezes.  Musculoskeletal: He exhibits  no edema.  Lymphadenopathy:    He has no cervical adenopathy.  Neurological: He is alert.  Skin: Skin is warm. No rash noted.  Psychiatric: His behavior is normal.  Vitals reviewed.  Patient complains of pain around the rectum area he states it bulges out at times I did  not find any findings on exam today except for reddened area on the buttock that did not appear to be infected patient relates that this gives him a lot of frequent pain and discomfort I think it is reasonable for him to see a surgeon for consultation but I am not certain if they would necessarily recommend anything       Assessment & Plan:  The patient was seen today as part of a comprehensive visit for diabetes. The importance of keeping her A1c at or below 7 was discussed.  Importance of regular physical activity was discussed.   The importance of adherence to medication as well as a controlled low starch/sugar diet was also discussed.  Standard follow-up visit recommended.  Finally failure to follow good diabetic measures including self effort and compliance with recommendations can certainly increase the risk of heart disease strokes kidney failure blindness loss of limb and early death was discussed with the patient. Good control currently  The patient was seen today as part of an evaluation regarding hyperlipidemia.  Recent lab work has been reviewed with the patient as well as the goals for good cholesterol care.  In addition to this medications have been discussed the importance of compliance with diet and medications discussed as well.  Finally the patient is aware that poor control of cholesterol, noncompliance can dramatically increase her risk of heart attack strokes and premature death.  The patient will keep regular office visits and the patient does agreed to periodic lab work. Good control currently continue medication  HTN- Patient was seen today as part of a visit regarding hypertension. The importance of healthy diet and regular physical activity was discussed. The importance of compliance with medications discussed.  Ideal goal is to keep blood pressure low elevated levels certainly below 841/32 when possible.  The patient was counseled that keeping blood pressure under  control lessen his risk of heart attack, stroke, kidney failure, and early death.  The importance of regular follow-ups was discussed with the patient.  Low-salt diet such as DASH recommended. Regular physical activity was recommended as well.  Patient was advised to keep regular follow-ups. Good control currently continue medication  Depression good control continue medication  Insomnia does well with the Xanax at night  Chronic back pain previous surgeries debilitated but does not use pain medicine much at all he knows not to take it with Xanax  Rectal pain buttock pain referral to general surgery for their opinion not sure what they will be able to do but it is reasonable to get this checked  25 minutes was spent with the patient.  This statement verifies that 25 minutes was indeed spent with the patient. Greater than half the time was spent in discussion, counseling and answering questions  regarding the issues that the patient came in for today as reflected in the diagnosis (s) please refer to documentation for further details.

## 2017-10-01 ENCOUNTER — Encounter: Payer: Self-pay | Admitting: Family Medicine

## 2017-10-01 ENCOUNTER — Encounter (INDEPENDENT_AMBULATORY_CARE_PROVIDER_SITE_OTHER): Payer: Self-pay

## 2017-10-19 ENCOUNTER — Encounter: Payer: Self-pay | Admitting: General Surgery

## 2017-10-19 ENCOUNTER — Ambulatory Visit: Payer: Medicare Other | Admitting: General Surgery

## 2017-10-19 ENCOUNTER — Ambulatory Visit (INDEPENDENT_AMBULATORY_CARE_PROVIDER_SITE_OTHER): Payer: Medicare Other | Admitting: General Surgery

## 2017-10-19 VITALS — BP 178/81 | HR 68 | Temp 98.0°F

## 2017-10-19 DIAGNOSIS — M533 Sacrococcygeal disorders, not elsewhere classified: Secondary | ICD-10-CM

## 2017-10-19 DIAGNOSIS — I25119 Atherosclerotic heart disease of native coronary artery with unspecified angina pectoris: Secondary | ICD-10-CM | POA: Diagnosis not present

## 2017-10-19 IMAGING — MR MR HEAD W/O CM
10 of 11 series · 34 of 48 positions shown · non-contrast
Comparison: Noncontrast head CT 03/30/2016

CLINICAL DATA: [age] year old male with confusion,
generalized weakness, gait instability, recurrent falls. Multiple
prior lumbar spine surgeries. Question of normal pressure
hydrocephalus raised on noncontrast head CT. Initial encounter.

EXAM:
MRI HEAD WITHOUT CONTRAST
TECHNIQUE: Multiplanar, multiecho pulse sequences of the brain and surrounding
structures were obtained without intravenous contrast.

[Series 3: DWI · axial · 3.0mm · 0.94mm/px · z∈[-15,+144]mm · 8 of 110 slices shown (1 of 2)]
[im 1/110]
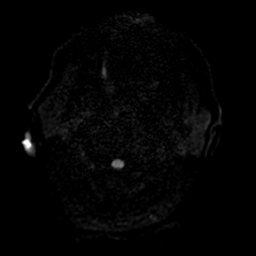
[im 13/110]
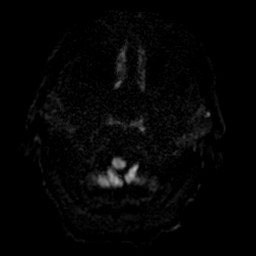
[im 37/110]
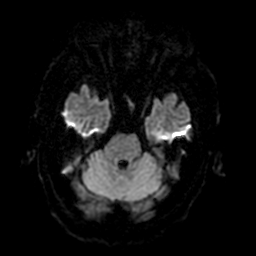
[im 49/110]
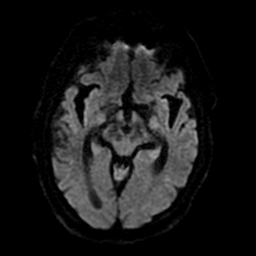
[im 61/110]
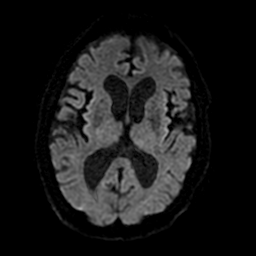
[im 73/110]
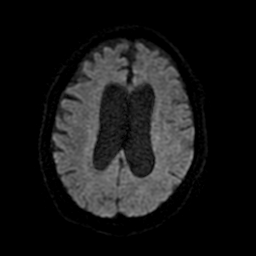
[im 97/110]
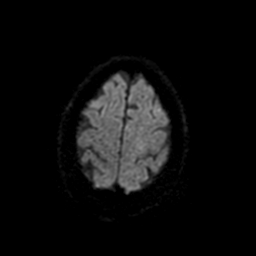
[im 110/110]
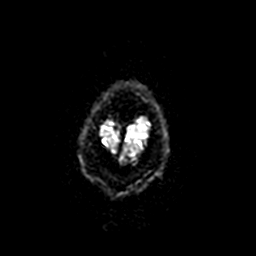

[Series 4: DWI · coronal · 4.0mm · 0.94mm/px · 7 of 72 slices shown (2 of 2)]
[im 1/72]
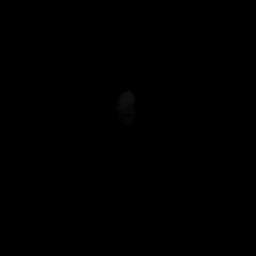
[im 12/72]
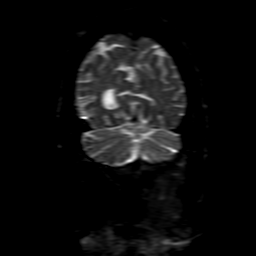
[im 24/72]
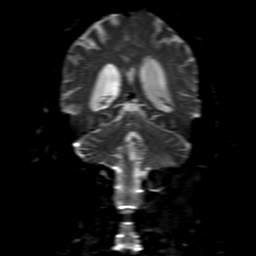
[im 36/72]
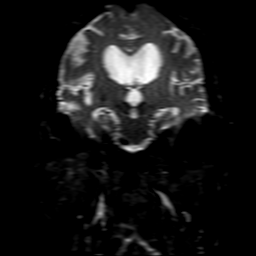
[im 48/72]
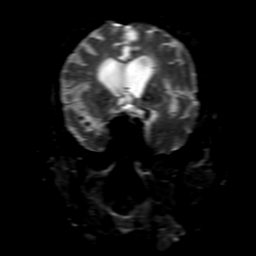
[im 60/72]
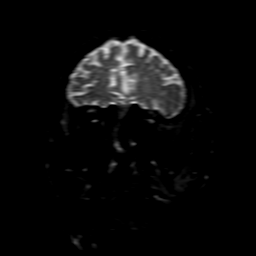
[im 72/72]
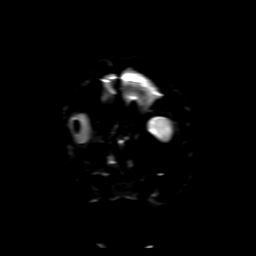

[Series 5: FLAIR · sagittal · 5.0mm · 0.47mm/px · 2 of 23 slices shown (1 of 2)]
[im 1/23]
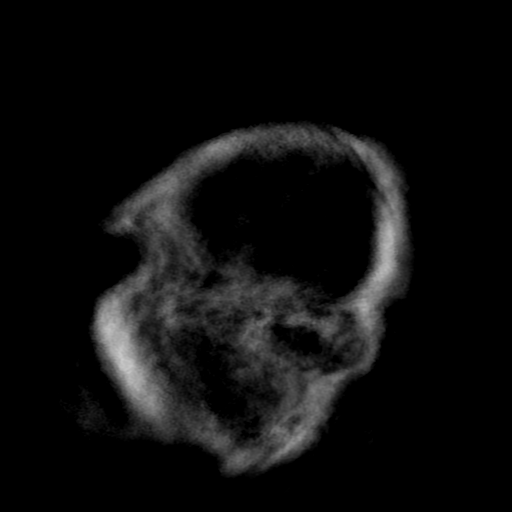
[im 23/23]
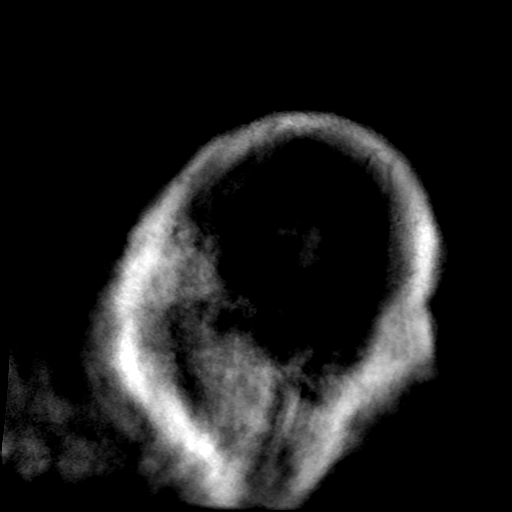

[Series 6: T2 · axial · 5.0mm · 0.47mm/px · z∈[-15,+144]mm · 2 of 28 slices shown (1 of 2)]
[im 1/28]
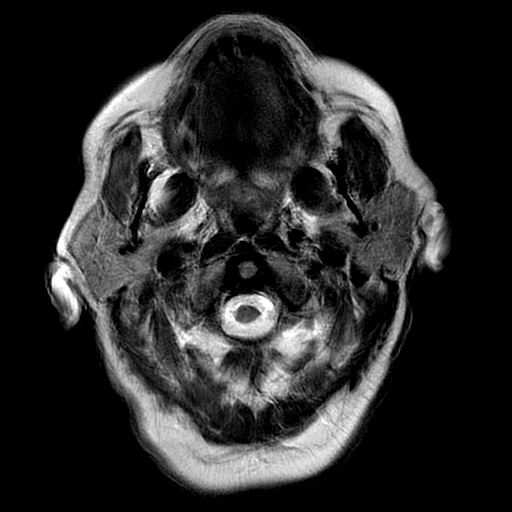
[im 28/28]
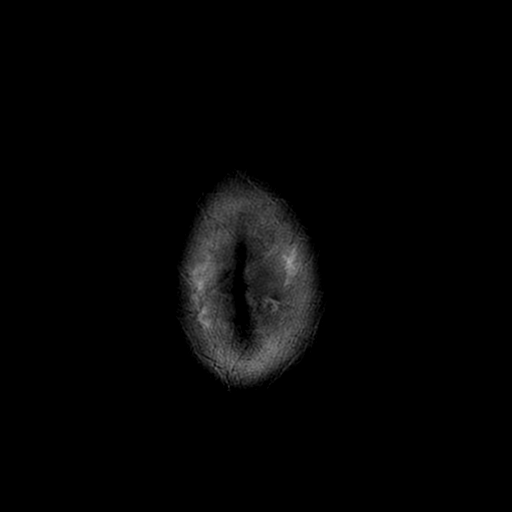

[Series 7: FLAIR · axial · 5.0mm · 0.47mm/px · z∈[-15,+144]mm · 2 of 28 slices shown (2 of 2)]
[im 1/28]
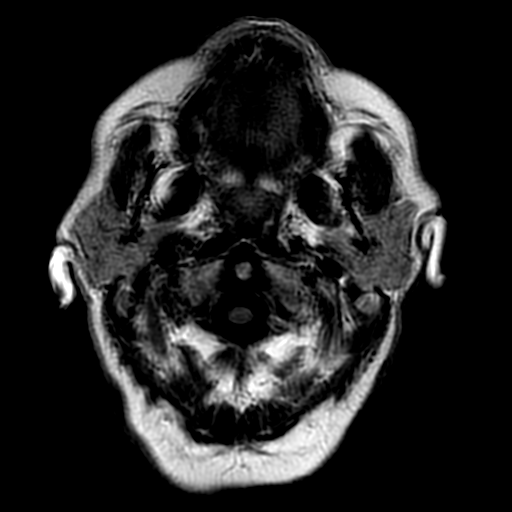
[im 28/28]
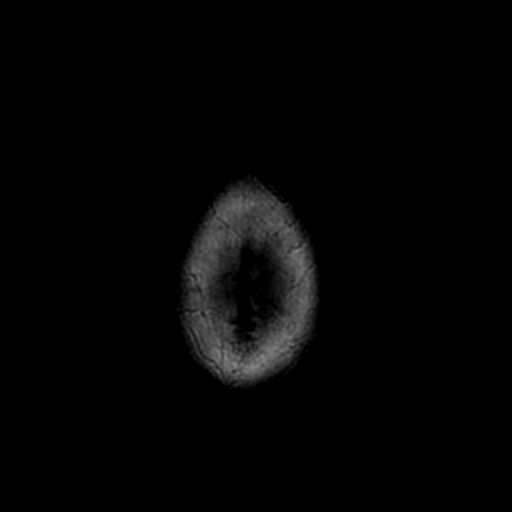

[Series 8: (person_name) · axial · 3.0mm · 0.47mm/px · 1 of 112 slices shown]
[im 1/112]
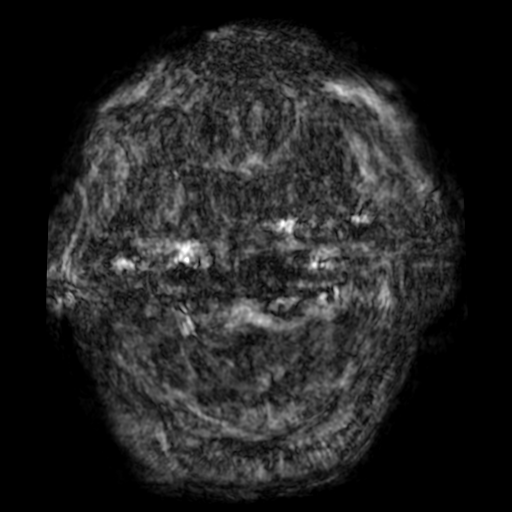

[Series 10: T2 · coronal · 5.0mm · 0.47mm/px · 2 of 30 slices shown (2 of 2)]
[im 1/30]
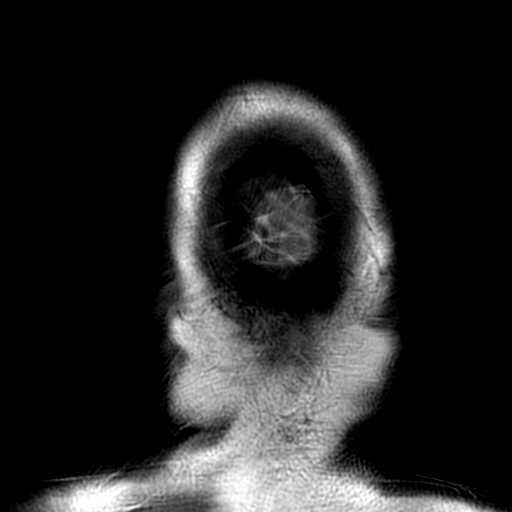
[im 30/30]
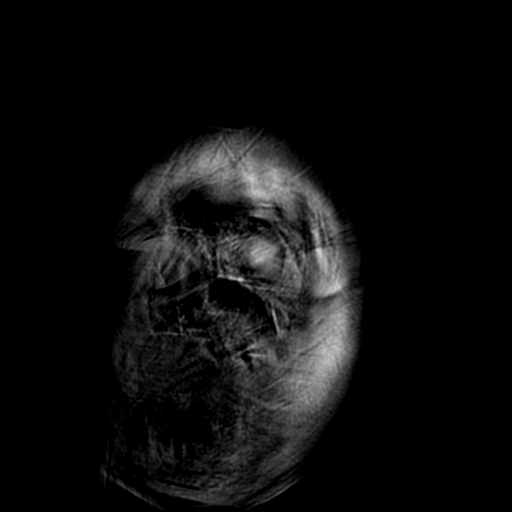

[Series 11: GRE · axial · 5.0mm · 0.47mm/px · z∈[-15,+144]mm · 2 of 28 slices shown]
[im 1/28]
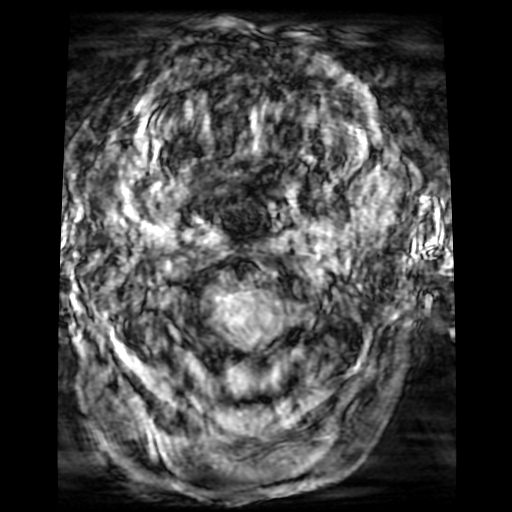
[im 28/28]
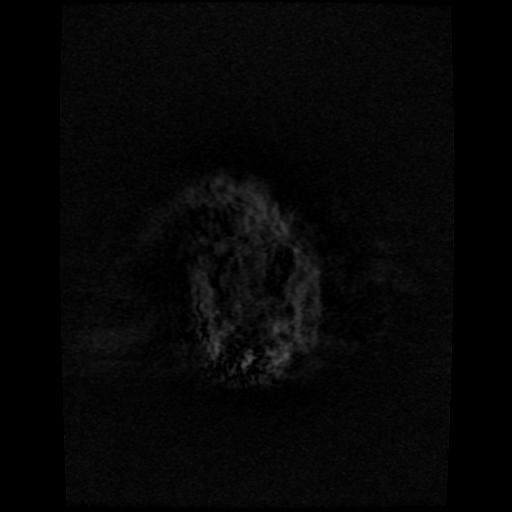

[Series 350: ADC · axial · 3.0mm · 0.94mm/px · z∈[-15,+144]mm · 5 of 55 slices shown (1 of 2)]
[im 1/55]
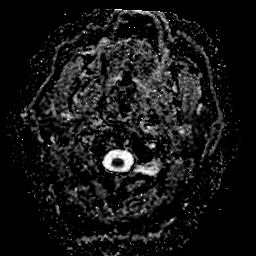
[im 14/55]
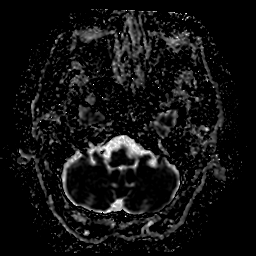
[im 28/55]
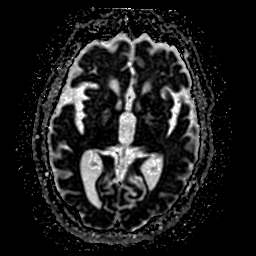
[im 41/55]
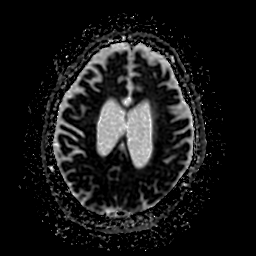
[im 55/55]
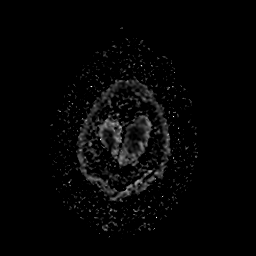

[Series 450: ADC · coronal · 4.0mm · 0.94mm/px · 3 of 36 slices shown (2 of 2)]
[im 1/36]
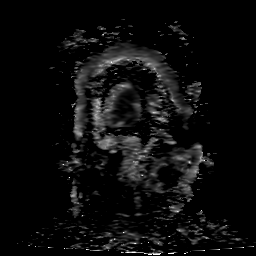
[im 18/36]
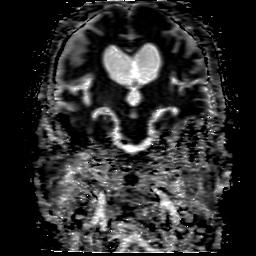
[im 36/36]
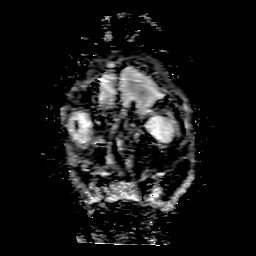

[34 of 48 positions shown; findings below may reference images not displayed]

FINDINGS: Study is intermittently degraded by motion artifact despite repeated
imaging attempts.

Brain: No restricted diffusion to suggest acute infarction. No
midline shift, mass effect, evidence of mass lesion, extra-axial
collection or acute intracranial hemorrhage. Cervicomedullary
junction and pituitary are within normal limits.

The lateral and third ventricles are prominent as seen on the recent
CT, but the temporal horns appear fairly normal. No evidence of
hyperdynamic flow at the cerebral aqueduct. The fourth ventricle is
normal. No transependymal edema.

Minimal periventricular and mild for age other patchy nonspecific
mostly anterior frontal lobe white matter T2 and FLAIR
hyperintensity. No cortical encephalomalacia or chronic cerebral
blood products identified. T2 heterogeneity in the deep gray matter
nuclei appears mostly due to perivascular spaces although there is
evidence of a small chronic lacunar infarct in the right basal
ganglia. Negative brainstem and cerebellum.

Vascular: Major intracranial vascular flow voids are preserved with
generalized intracranial artery tortuosity. The distal left
vertebral artery is dominant and dolichoectatic.

Skull and upper cervical spine: Not well visualized. Normal bone
marrow signal at the skullbase and in the upper cervical spine.

Sinuses/Orbits: Negative orbits soft tissues, postoperative changes
to the left globe. Trace paranasal sinus mucosal thickening.

Other: Visible internal auditory structures appear normal. Mastoids
are clear. Negative scalp soft tissues.
IMPRESSION: 1. In the appropriate clinical setting normal pressure hydrocephalus
would be difficult to exclude. Prominence of the lateral and third
ventricles as seen on CT, but otherwise lacking strong imaging
evidence of NPH.
2. No acute intracranial abnormality. Generalized intracranial
artery dolichoectasia. Mild for age chronic small vessel disease.
3. Motion degraded exam.

## 2017-10-19 NOTE — Progress Notes (Signed)
Francisco Powell; [id number]; 04/12/1941   HPI Patient is a 77 year old white male with multiple medical problems who was referred to my care by Dr. Sallee Lange for evaluation and treatment of pain over 6 coccyx.  He is pretty much wheelchair-bound, but is able to transfer on his own.  He does lie in a recliner frequently throughout the day.  He states he has intermittent pain along a skin fold just to the right of the coccyx.  No drainage has been noted.  Denies any fever or chills.  The pain is variable in nature, though he currently has 0 out of 10 pain. Past Medical History:  Diagnosis Date  . Anemia, iron deficiency   . Anxiety   . Asbestosis(501)   . Coronary atherosclerosis of native coronary artery    DES RCA and staged DES LAD May 2015, LVEF 55%  . Essential hypertension, benign   . Gout   . Hx of adenomatous colonic polyps   . Hyperlipidemia   . Kidney carcinoma ([place]) 1998  . Lumbar back pain    Spondylosis and spondylolisthesis   . Myocardial infarction ([place]) 2004  . Nephrolithiasis   . NSTEMI (non-ST elevated myocardial infarction) [facility])    May 2015  . Obesity   . Sciatica of right side   . Type II diabetes mellitus ([place])     Past Surgical History:  Procedure Laterality Date  . APPENDECTOMY    . BACK SURGERY  X 4  . CHOLECYSTECTOMY     Biliary dyskinesia  . COLONOSCOPY  2009   Dr. Arnoldo Morale: normal  . COLONOSCOPY  June 2011   Dr. Gala Romney: normal rectum, left-sided diverticula  . COLONOSCOPY N/A 04/12/2015   Procedure: COLONOSCOPY;  Surgeon: [name], MD;  Location: [facility];  Service: Endoscopy;  Laterality: N/A;  1115   . CYSTOSCOPY W/ LITHOLAPAXY / EHL  2009  . ESOPHAGOGASTRODUODENOSCOPY  June 2011   Dr. Gala Romney: erosive reflux esophagitis, small hiatal hernia, +H.pylori gastritis  . EYE SURGERY    . HOLMIUM LASER APPLICATION Right 7619  . LEFT HEART CATHETERIZATION WITH CORONARY ANGIOGRAM N/A 11/09/2013   Procedure: LEFT HEART CATHETERIZATION WITH  CORONARY ANGIOGRAM;  Surgeon: [name], MD;  Location: [facility] CATH LAB;  Service: Cardiovascular;  Laterality: N/A;  . LUMBAR DISC SURGERY  X 3  . PERCUTANEOUS CORONARY STENT INTERVENTION (PCI-S) N/A 11/10/2013   Procedure: PERCUTANEOUS CORONARY STENT INTERVENTION (PCI-S);  Surgeon: [name], MD;  Location: [facility] CATH LAB;  Service: Cardiovascular;  Laterality: N/A;  . POSTERIOR LUMBAR FUSION  2014  . RETINAL DETACHMENT SURGERY Right 1998   Secondary to injury detached retina/cataracts  . TONSILLECTOMY    . TUMOR EXCISION  1995    Family History  Problem Relation Age of Onset  . Colon cancer Mother        deceased   . Cancer Maternal Grandmother        Gastric cancer  . Breast cancer Sister   . Heart attack Father   . Colon cancer Maternal Uncle   . Colon cancer Maternal Uncle   . Colon cancer Other        maternal great uncle  . Colon polyps Sister     Current Outpatient Medications on File Prior to Visit  Medication Sig Dispense Refill  . ALPRAZolam (XANAX) 1 MG tablet 0.5-1 tablet at bedtime as needed for sleep 30 tablet 5  . amLODipine (NORVASC) 10 MG tablet TAKE (1) TABLET BY MOUTH ONCE  DAILY. 90 tablet 2  . aspirin (ASPIRIN 81) 81 MG chewable tablet Chew 81 mg by mouth daily.    . B-D ULTRAFINE III SHORT PEN 31G X 8 MM MISC USE AS DIRECTED. 100 each 5  . carvedilol (COREG) 25 MG tablet TAKE (1) TABLET BY MOUTH TWICE DAILY. 60 tablet 5  . dapagliflozin propanediol (FARXIGA) 10 MG TABS tablet Take 10 mg by mouth daily. 30 tablet 5  . Diphenhydramine-APAP, sleep, (TYLENOL PM EXTRA STRENGTH PO) Take 1 tablet by mouth every 4 (four) hours as needed.    . DULoxetine (CYMBALTA) 60 MG capsule TAKE ONE CAPSULE BY MOUTH DAILY. 30 capsule 5  . GLUCERNA (GLUCERNA) LIQD Take 237 mLs by mouth 2 (two) times daily between meals.    Marland Kitchen LEVEMIR 100 UNIT/ML injection INJECT 50 UNITS INTO THE SKIN TWICE DAILY. (Patient taking differently: INJECT 55 UNITS INTO THE SKIN TWICE DAILY.) 20  mL 5  . lidocaine (XYLOCAINE) 2 % jelly Apply 1 application topically as needed. 30 mL 2  . lisinopril (PRINIVIL,ZESTRIL) 40 MG tablet Take 1 tablet (40 mg total) by mouth daily. 90 tablet 1  . Melatonin 10 MG TABS Take 1 tablet by mouth at bedtime.    . Menthol, Topical Analgesic, (BIOFREEZE) 4 % GEL Apply Biofreeze to spine for pain management    . metFORMIN (GLUCOPHAGE) 1000 MG tablet TAKE 1 TABLET BY MOUTH TWICE DAILY WITH A MEAL. 180 tablet 1  . nitroGLYCERIN (NITROSTAT) 0.4 MG SL tablet Place 1 tablet (0.4 mg total) under the tongue every 5 (five) minutes x 3 doses as needed for chest pain. 25 tablet 4  . NOVOLOG FLEXPEN 100 UNIT/ML FlexPen INJECT 25 UNITS INTO THE SKIN 3 TIMES DAILY. 15 mL 5  . OVER THE COUNTER MEDICATION Take 1 tablet by mouth as needed (constipation).    Marland Kitchen oxyCODONE-acetaminophen (PERCOCET) 10-325 MG tablet Take one tablet by mouth every 4 hours as needed for pain 180 tablet 0  . polyethylene glycol (MIRALAX / GLYCOLAX) packet Take 17 g by mouth daily as needed for mild constipation. (Patient not taking: Reported on 09/28/2017) 14 each 0  . pravastatin (PRAVACHOL) 80 MG tablet TAKE 1 TABLET BY MOUTH AT BEDTIME FOR CHOLESTEROL. 90 tablet 1   No current facility-administered medications on file prior to visit.     Allergies  Allergen Reactions  . Penicillins Hives, Itching and Swelling    Has patient had a PCN reaction causing immediate rash, facial/tongue/throat swelling, SOB or lightheadedness with hypotension: Unknown Has patient had a PCN reaction causing severe rash involving mucus membranes or skin necrosis: Unknown Has patient had a PCN reaction that required hospitalization: Unknown Has patient had a PCN reaction occurring within the last 10 years: Unknown If all of the above answers are "NO", then may proceed with Cephalosporin use.   . Other     Bee stings  . Atorvastatin Other (See Comments)    Muscle pain/cramps   . Celexa [Citalopram] Diarrhea     Social History   Substance and Sexual Activity  Alcohol Use No  . Alcohol/week: 0.0 oz    Social History   Tobacco Use  Smoking Status Never Smoker  Smokeless Tobacco Current User  . Types: Chew    Review of Systems  Constitutional: Negative.   HENT: Negative.   Eyes: Negative.   Respiratory: Negative.   Cardiovascular: Negative.   Gastrointestinal: Negative.   Genitourinary: Negative.   Musculoskeletal: Positive for back pain.  Skin: Positive for itching.  Neurological:  Positive for headaches.  Endo/Heme/Allergies: Negative.   Psychiatric/Behavioral: Negative.     Objective   Vitals:   10/19/17 1459  BP: (!) 178/81  Pulse: 68  Temp: 98 F (36.7 C)    Physical Exam  Constitutional: He is oriented to person, place, and time. He appears well-developed and well-nourished.  Sitting in wheelchair  HENT:  Head: Normocephalic and atraumatic.  Cardiovascular: Normal rate, regular rhythm and normal heart sounds. Exam reveals no gallop and no friction rub.  No murmur heard. Pulmonary/Chest: Effort normal and breath sounds normal. No stridor. No respiratory distress. He has no wheezes. He has no rales.  Neurological: He is alert and oriented to person, place, and time.  Skin:  There is a circumferential ovoid area of superficial retching of skin around the coccyx.  There is no punctum present.  No significant induration is noted deep to this.  No erythema is noted.  There is a subtle hint of a resolved ecchymosis present inferiorly.  The amount of subcutaneous tissue is limited in this region.  Vitals reviewed.   Assessment  Pain over coccyx Plan   I told the patient that there is nothing I can offer from a surgical standpoint.  I would be very reluctant to do any surgical excision as he is a poor wound healing candidate.  I told him that when he is lying in the recliner, he needs to keep the pressure off his coccyx to avoid pressure sores. This may help him in the  future.  He understands this and agrees.  Follow-up here as needed.

## 2017-11-08 ENCOUNTER — Other Ambulatory Visit: Payer: Self-pay | Admitting: Family Medicine

## 2017-11-10 ENCOUNTER — Other Ambulatory Visit: Payer: Self-pay

## 2017-11-10 MED ORDER — INSULIN ASPART 100 UNIT/ML FLEXPEN: PEN_INJECTOR | SUBCUTANEOUS | 5 refills | Status: DC

## 2017-11-12 ENCOUNTER — Other Ambulatory Visit: Payer: Self-pay | Admitting: Family Medicine

## 2017-12-01 ENCOUNTER — Other Ambulatory Visit: Payer: Self-pay | Admitting: Family Medicine

## 2017-12-01 NOTE — Telephone Encounter (Signed)
May have this +5 refills 

## 2017-12-08 ENCOUNTER — Telehealth: Payer: Self-pay | Admitting: Family Medicine

## 2017-12-08 NOTE — Telephone Encounter (Signed)
Patient has borrowed a friends evolution walker and it is doing very well for him.  His daughter wants to know if an order can be sent to [place] for this?  Also, he was talking to one of his friends and their doctor put them on Gabapentin to help them sleep at night.  He wants to know if this is a possibility for him?  [employer]

## 2017-12-09 MED ORDER — GABAPENTIN 100 MG PO CAPS: 100.0000 mg | ORAL_CAPSULE | Freq: Every day | ORAL | 5 refills | Status: DC

## 2017-12-09 NOTE — Telephone Encounter (Signed)
Prescriptions sent electronically to pharmacy. Daughter(DPR) notified and verbalized understanding.

## 2017-12-09 NOTE — Telephone Encounter (Signed)
So gabapentin can sometimes be used to help with neuropathic pain such as back pain leg pain it may also help with sleep.  We always recommend starting off at a low dose.  I would recommend gabapentin 100 mg 1 nightly, #30, 5 refills if that does not help enough with his discomfort and with his sleep we can adjust the dose upward  He may have a prescription for the walker as requested, diagnosis weakness ataxia

## 2017-12-13 ENCOUNTER — Other Ambulatory Visit: Payer: Self-pay | Admitting: Family Medicine

## 2018-01-28 ENCOUNTER — Ambulatory Visit: Payer: Medicare Other | Admitting: Family Medicine

## 2018-02-18 ENCOUNTER — Ambulatory Visit: Payer: Medicare Other | Admitting: Family Medicine

## 2018-02-23 ENCOUNTER — Other Ambulatory Visit: Payer: Self-pay | Admitting: Family Medicine

## 2018-02-23 ENCOUNTER — Ambulatory Visit: Payer: Medicare Other | Admitting: Family Medicine

## 2018-03-01 ENCOUNTER — Ambulatory Visit (INDEPENDENT_AMBULATORY_CARE_PROVIDER_SITE_OTHER): Payer: Medicare Other | Admitting: Family Medicine

## 2018-03-01 ENCOUNTER — Encounter: Payer: Self-pay | Admitting: Family Medicine

## 2018-03-01 VITALS — BP 136/80 | Ht 74.0 in | Wt 260.0 lb

## 2018-03-01 DIAGNOSIS — I10 Essential (primary) hypertension: Secondary | ICD-10-CM

## 2018-03-01 DIAGNOSIS — E119 Type 2 diabetes mellitus without complications: Secondary | ICD-10-CM

## 2018-03-01 DIAGNOSIS — T148XXA Other injury of unspecified body region, initial encounter: Secondary | ICD-10-CM

## 2018-03-01 DIAGNOSIS — I25119 Atherosclerotic heart disease of native coronary artery with unspecified angina pectoris: Secondary | ICD-10-CM | POA: Diagnosis not present

## 2018-03-01 DIAGNOSIS — Z23 Encounter for immunization: Secondary | ICD-10-CM | POA: Diagnosis not present

## 2018-03-01 DIAGNOSIS — E7849 Other hyperlipidemia: Secondary | ICD-10-CM

## 2018-03-01 LAB — POCT GLYCOSYLATED HEMOGLOBIN (HGB A1C): HEMOGLOBIN A1C: 6.6 % — AB (ref 4.0–5.6)

## 2018-03-01 MED ORDER — DAPAGLIFLOZIN PROPANEDIOL 10 MG PO TABS: 10.0000 mg | ORAL_TABLET | Freq: Every day | ORAL | 5 refills | Status: DC

## 2018-03-01 MED ORDER — METFORMIN HCL 1000 MG PO TABS: ORAL_TABLET | ORAL | 1 refills | Status: DC

## 2018-03-01 MED ORDER — LISINOPRIL 40 MG PO TABS: 40.0000 mg | ORAL_TABLET | Freq: Every day | ORAL | 1 refills | Status: DC

## 2018-03-01 NOTE — Progress Notes (Signed)
Subjective:    Patient ID: Francisco Powell, male    DOB: 09-07-1940, 77 y.o.   MRN: [id number]  HPI Patient is here today to follow up on his chronic illnesses. Francisco Powell states Francisco Powell eats just about anything Francisco Powell wants, and Francisco Powell gets some exercise at home by walking. Francisco Powell sees a cardiologist Q year.   Patient for blood pressure check up.  The patient does have hypertension.  The patient is on medication.  Patient relates compliance with meds. Todays BP reviewed with the patient. Patient denies issues with medication. Patient relates reasonable diet. Patient tries to minimize salt. Patient aware of BP goals.  Patient here for follow-up regarding cholesterol.  The patient does have hyperlipidemia.  Patient does try to maintain a reasonable diet.  Patient does take the medication on a regular basis.  Denies missing a dose.  The patient denies any obvious side effects.  Prior blood work results reviewed with the patient.  The patient is aware of his cholesterol goals and the need to keep it under good control to lessen the risk of disease.  The patient was seen today as part of a comprehensive diabetic check up.the patient does have diabetes.  The patient follows here on a regular basis.  The patient relates medication compliance. No significant side effects to the medications. Denies any low glucose spells. Relates compliance with diet to a reasonable level. Patient does do labwork intermittently and understands the dangers of diabetes.   Results for orders placed or performed in visit on 03/01/18  POCT glycosylated hemoglobin (Hb A1C)  Result Value Ref Range   Hemoglobin A1C 6.6 (A) 4.0 - 5.6 %   HbA1c POC (<> result, manual entry)     HbA1c, POC (prediabetic range)     HbA1c, POC (controlled diabetic range)      Review of Systems  Constitutional: Negative for diaphoresis and fatigue.  HENT: Negative for congestion and rhinorrhea.   Respiratory: Negative for cough and shortness of breath.     Cardiovascular: Negative for chest pain and leg swelling.  Gastrointestinal: Negative for abdominal pain and diarrhea.  Skin: Negative for color change and rash.  Neurological: Negative for dizziness and headaches.  Psychiatric/Behavioral: Negative for behavioral problems and confusion.       Objective:   Physical Exam  Constitutional: Francisco Powell appears well-nourished. No distress.  HENT:  Head: Normocephalic and atraumatic.  Eyes: Right eye exhibits no discharge. Left eye exhibits no discharge.  Neck: No tracheal deviation present.  Cardiovascular: Normal rate, regular rhythm and normal heart sounds.  No murmur heard. Pulmonary/Chest: Effort normal and breath sounds normal. No respiratory distress.  Musculoskeletal: Francisco Powell exhibits no edema.  Lymphadenopathy:    Francisco Powell has no cervical adenopathy.  Neurological: Francisco Powell is alert. Coordination normal.  Skin: Skin is warm and dry.  Psychiatric: Francisco Powell has a normal mood and affect. His behavior is normal.  Vitals reviewed.  Abrasion to the right lower leg noted   25 minutes was spent with the patient.  This statement verifies that 25 minutes was indeed spent with the patient.  More than 50% of this visit-total duration of the visit-was spent in counseling and coordination of care. The issues that the patient came in for today as reflected in the diagnosis (s) please refer to documentation for further details.     Assessment & Plan:  Chronic back pain as well as chronic leg pain doing well currently rarely uses oxycodone  Diabetes decent control overall no low sugar spells  encouraged Francisco Powell to check his sugars more often follow a diabetic diet follow-up again with [place] in 6 months sooner problems  Blood pressure good control watching salt to some degree taking his medicine follow-up if ongoing troubles.  Hyperlipidemia takes his medicine previous labs reviewed we will do new labs again when Francisco Powell follows up in the spring  Abrasion on the lower leg shot given  follow-up if progressive troubles

## 2018-03-02 LAB — BASIC METABOLIC PANEL
BUN/Creatinine Ratio: 17 (ref 10–24)
BUN: 16 mg/dL (ref 8–27)
CHLORIDE: 103 mmol/L (ref 96–106)
CO2: 23 mmol/L (ref 20–29)
Calcium: 9.7 mg/dL (ref 8.6–10.2)
Creatinine, Ser: 0.94 mg/dL (ref 0.76–1.27)
GFR calc Af Amer: 92 mL/min/{1.73_m2} (ref >59)
GFR calc non Af Amer: 80 mL/min/{1.73_m2} (ref >59)
Glucose: 101 mg/dL — ABNORMAL HIGH (ref 65–99)
POTASSIUM: 4.7 mmol/L (ref 3.5–5.2)
SODIUM: 146 mmol/L — AB (ref 134–144)

## 2018-03-02 MED ORDER — INSULIN ASPART 100 UNIT/ML FLEXPEN: PEN_INJECTOR | SUBCUTANEOUS | 5 refills | Status: DC

## 2018-03-02 NOTE — Addendum Note (Signed)
Addended by: Karle Barr on: 03/02/2018 05:22 PM   Modules accepted: Orders

## 2018-03-02 NOTE — Progress Notes (Signed)
Done

## 2018-03-29 ENCOUNTER — Other Ambulatory Visit: Payer: Self-pay

## 2018-03-29 MED ORDER — INSULIN DETEMIR 100 UNIT/ML ~~LOC~~ SOLN: SUBCUTANEOUS | 5 refills | Status: DC

## 2018-04-26 ENCOUNTER — Other Ambulatory Visit: Payer: Self-pay | Admitting: Family Medicine

## 2018-05-06 ENCOUNTER — Other Ambulatory Visit: Payer: Self-pay | Admitting: Family Medicine

## 2018-05-07 NOTE — Telephone Encounter (Signed)
May have 1 refill 

## 2018-05-18 ENCOUNTER — Other Ambulatory Visit: Payer: Self-pay

## 2018-05-21 MED ORDER — GABAPENTIN 100 MG PO CAPS: 100.0000 mg | ORAL_CAPSULE | Freq: Every day | ORAL | 5 refills | Status: DC

## 2018-05-25 ENCOUNTER — Other Ambulatory Visit: Payer: Self-pay | Admitting: Family Medicine

## 2018-07-01 ENCOUNTER — Other Ambulatory Visit: Payer: Self-pay | Admitting: Family Medicine

## 2018-07-01 ENCOUNTER — Telehealth: Payer: Self-pay | Admitting: Family Medicine

## 2018-07-01 ENCOUNTER — Other Ambulatory Visit: Payer: Self-pay

## 2018-07-01 MED ORDER — CARVEDILOL 25 MG PO TABS: ORAL_TABLET | ORAL | 0 refills | Status: DC

## 2018-07-01 NOTE — Telephone Encounter (Signed)
Requesting refill for carvedilol (COREG) 25 MG tablet   Pharmacy:  [facility]  [address]

## 2018-07-01 NOTE — Telephone Encounter (Signed)
Aware medication has been sent in.

## 2018-07-01 NOTE — Telephone Encounter (Signed)
Medication sent in; left message to return call  

## 2018-07-08 ENCOUNTER — Other Ambulatory Visit: Payer: Self-pay | Admitting: Family Medicine

## 2018-07-10 NOTE — Telephone Encounter (Signed)
This patient may have 3 refills on each of these very important for the patient to follow-up in the spring

## 2018-07-11 NOTE — Telephone Encounter (Signed)
Has apt 3/17 with dr Nicki Reaper

## 2018-08-02 ENCOUNTER — Other Ambulatory Visit: Payer: Self-pay | Admitting: *Deleted

## 2018-08-02 ENCOUNTER — Telehealth: Payer: Self-pay | Admitting: Family Medicine

## 2018-08-02 MED ORDER — CARVEDILOL 25 MG PO TABS: ORAL_TABLET | ORAL | 1 refills | Status: DC

## 2018-08-02 MED ORDER — CARVEDILOL 25 MG PO TABS: ORAL_TABLET | ORAL | 0 refills | Status: DC

## 2018-08-02 MED ORDER — AMLODIPINE BESYLATE 10 MG PO TABS: ORAL_TABLET | ORAL | 0 refills | Status: DC

## 2018-08-02 NOTE — Telephone Encounter (Signed)
Left message with pt's daughter Jeannene Patella on number [phone]. Refills sent to pharm. Has an appt in march

## 2018-08-02 NOTE — Telephone Encounter (Signed)
Refills sent per dr Nicki Reaper. Pt's daughter notified.

## 2018-08-02 NOTE — Telephone Encounter (Signed)
Patient is requesting refill on norvasc 10 mg and Coreg 25 mg states completely out to be called into [place]

## 2018-08-19 ENCOUNTER — Other Ambulatory Visit: Payer: Self-pay | Admitting: Family Medicine

## 2018-08-19 MED ORDER — PRAVASTATIN SODIUM 80 MG PO TABS: ORAL_TABLET | ORAL | 1 refills | Status: DC

## 2018-08-19 NOTE — Telephone Encounter (Signed)
Prescription sent electronically to pharmacy. Daughter(DPR) notified. °

## 2018-08-19 NOTE — Telephone Encounter (Signed)
May have 6 months worth of refills

## 2018-08-19 NOTE — Telephone Encounter (Signed)
Pt is needing pravastatin (PRAVACHOL) 80 MG tablet called in. He has been out of this medication a week. Pt has follow up on 09/05/18.    Please send to [facility], [place]

## 2018-08-30 ENCOUNTER — Ambulatory Visit: Payer: Medicare Other | Admitting: Family Medicine

## 2018-09-05 ENCOUNTER — Ambulatory Visit: Payer: Medicare Other | Admitting: Family Medicine

## 2018-09-09 ENCOUNTER — Other Ambulatory Visit: Payer: Self-pay | Admitting: Family Medicine

## 2018-09-29 ENCOUNTER — Other Ambulatory Visit: Payer: Self-pay | Admitting: Family Medicine

## 2018-10-07 ENCOUNTER — Ambulatory Visit (INDEPENDENT_AMBULATORY_CARE_PROVIDER_SITE_OTHER): Payer: Medicare Other | Admitting: Family Medicine

## 2018-10-07 ENCOUNTER — Other Ambulatory Visit: Payer: Self-pay

## 2018-10-07 DIAGNOSIS — IMO0001 Reserved for inherently not codable concepts without codable children: Secondary | ICD-10-CM

## 2018-10-07 DIAGNOSIS — Z6832 Body mass index (BMI) 32.0-32.9, adult: Secondary | ICD-10-CM | POA: Insufficient documentation

## 2018-10-07 DIAGNOSIS — F325 Major depressive disorder, single episode, in full remission: Secondary | ICD-10-CM

## 2018-10-07 DIAGNOSIS — Z794 Long term (current) use of insulin: Secondary | ICD-10-CM

## 2018-10-07 DIAGNOSIS — I10 Essential (primary) hypertension: Secondary | ICD-10-CM | POA: Diagnosis not present

## 2018-10-07 DIAGNOSIS — E7849 Other hyperlipidemia: Secondary | ICD-10-CM

## 2018-10-07 DIAGNOSIS — E119 Type 2 diabetes mellitus without complications: Secondary | ICD-10-CM | POA: Diagnosis not present

## 2018-10-07 MED ORDER — PRAVASTATIN SODIUM 80 MG PO TABS: ORAL_TABLET | ORAL | 1 refills | Status: DC

## 2018-10-07 MED ORDER — LISINOPRIL 40 MG PO TABS: 40.0000 mg | ORAL_TABLET | Freq: Every day | ORAL | 1 refills | Status: DC

## 2018-10-07 MED ORDER — CARVEDILOL 25 MG PO TABS: ORAL_TABLET | ORAL | 1 refills | Status: DC

## 2018-10-07 MED ORDER — DULOXETINE HCL 60 MG PO CPEP: 60.0000 mg | ORAL_CAPSULE | Freq: Every day | ORAL | 1 refills | Status: DC

## 2018-10-07 MED ORDER — INSULIN ASPART 100 UNIT/ML FLEXPEN: PEN_INJECTOR | SUBCUTANEOUS | 6 refills | Status: DC

## 2018-10-07 MED ORDER — METFORMIN HCL 1000 MG PO TABS: ORAL_TABLET | ORAL | 1 refills | Status: DC

## 2018-10-07 MED ORDER — GABAPENTIN 100 MG PO CAPS: 100.0000 mg | ORAL_CAPSULE | Freq: Every day | ORAL | 1 refills | Status: DC

## 2018-10-07 MED ORDER — ALPRAZOLAM 1 MG PO TABS: 0.5000 mg | ORAL_TABLET | Freq: Every evening | ORAL | 5 refills | Status: DC | PRN

## 2018-10-07 MED ORDER — AMLODIPINE BESYLATE 10 MG PO TABS: ORAL_TABLET | ORAL | 1 refills | Status: DC

## 2018-10-07 MED ORDER — INSULIN DETEMIR 100 UNIT/ML ~~LOC~~ SOLN: SUBCUTANEOUS | 5 refills | Status: DC

## 2018-10-07 NOTE — Progress Notes (Signed)
Subjective:    Patient ID: Francisco Powell, male    DOB: 11-23-1940, 78 y.o.   MRN: [id number]  Diabetes  He presents for his follow-up diabetic visit. He has type 2 diabetes mellitus. There are no hypoglycemic associated symptoms. There are no diabetic associated symptoms. There are no hypoglycemic complications. There are no diabetic complications.  Hypertension  This is a chronic problem. There are no compliance problems.   Patient for blood pressure check up.  The patient does have hypertension.  The patient is on medication.  Patient relates compliance with meds. Todays BP reviewed with the patient. Patient denies issues with medication. Patient relates reasonable diet. Patient tries to minimize salt. Patient aware of BP goals.  Patient here for follow-up regarding cholesterol.  The patient does have hyperlipidemia.  Patient does try to maintain a reasonable diet.  Patient does take the medication on a regular basis.  Denies missing a dose.  The patient denies any obvious side effects.  Prior blood work results reviewed with the patient.  The patient is aware of his cholesterol goals and the need to keep it under good control to lessen the risk of disease.  The patient was seen today as part of a comprehensive diabetic check up.the patient does have diabetes.  The patient follows here on a regular basis.  The patient relates medication compliance. No significant side effects to the medications. Denies any low glucose spells. Relates compliance with diet to a reasonable level. Patient does do labwork intermittently and understands the dangers of diabetes.  The patient's BMI is calculated.  The patient does have obesity.  The patient does try to some degree staying active and watching diet.  It is in the vital signs and acknowledged.  It is above the recommended BMI for the patient's height and weight.  The patient has been counseled regarding healthy diet, restricted portions, avoiding excessive  carbohydrates/sugary foods, and increase physical activity as health permits.  It is in the patient's best interest to lower the risk of secondary illness including heart disease strokes and cancer by losing weight.  The patient acknowledges this information.    Fell last Saturday. Bedside commode slid out from under him. Most part is doing ok. Has accident in bed about 3 times in the past month. Pt has good days and bad days. Family is with him majority of time.  Virtual Visit via Video Note  I connected with Francisco Powell on 10/07/18 at  1:10 PM EDT by a video enabled telemedicine application and verified that I am speaking with the correct person using two identifiers.   I discussed the limitations of evaluation and management by telemedicine and the availability of in person appointments. The patient expressed understanding and agreed to proceed.  History of Present Illness:    Observations/Objective:   Assessment and Plan:   Follow Up Instructions: Coronavirus protection was discussed as well   I discussed the assessment and treatment plan with the patient. The patient was provided an opportunity to ask questions and all were answered. The patient agreed with the plan and demonstrated an understanding of the instructions.   The patient was advised to call back or seek an in-person evaluation if the symptoms worsen or if the condition fails to improve as anticipated.  I provided 25 minutes of non-face-to-face time during this encounter.   Vicente Males, LPN   Review of Systems     Objective:   Physical Exam  Assessment & Plan:  HTN- Patient was seen today as part of a visit regarding hypertension. The importance of healthy diet and regular physical activity was discussed. The importance of compliance with medications discussed.  Ideal goal is to keep blood pressure low elevated levels certainly below 790/38 when possible.  The patient was counseled that  keeping blood pressure under control lessen his risk of complications.  The importance of regular follow-ups was discussed with the patient.  Low-salt diet such as DASH recommended.  Regular physical activity was recommended as well.  Patient was advised to keep regular follow-ups.  The patient was seen today as part of a comprehensive visit for diabetes. The importance of keeping her A1c at or below 7 was discussed.  Importance of regular physical activity was discussed.   The importance of adherence to medication as well as a controlled low starch/sugar diet was also discussed.  Standard follow-up visit recommended.  Also patient aware failure to keep diabetes under control increases the risk of complications.  Morbid obesity patient doing the best he can with trying to watch how he eats has a very difficult time moving around because of orthopedic back trouble therefore unable to do any type of exercise  Insomnia uses Xanax at nighttime to help him rest does not cause any drowsiness during the morning hours.  Lab work will be done later in the summer with follow-up in approximately 4 to 5 months

## 2018-10-07 NOTE — Progress Notes (Signed)
Blood work ordered in EPIC 

## 2018-11-05 ENCOUNTER — Encounter: Payer: Self-pay | Admitting: Family Medicine

## 2018-12-22 ENCOUNTER — Encounter: Payer: Self-pay | Admitting: Family Medicine

## 2018-12-22 DIAGNOSIS — L989 Disorder of the skin and subcutaneous tissue, unspecified: Secondary | ICD-10-CM

## 2018-12-26 NOTE — Addendum Note (Signed)
Addended by: Vicente Males on: 12/26/2018 02:13 PM   Modules accepted: Orders

## 2018-12-26 NOTE — Telephone Encounter (Signed)
Although the area could be benign-the fact that it bleeds when hits as well as the appearance I highly recommend consultation with dermatology Dr. Juel Burrow office #1 please give referral #2 please talk with family-they can probably call Dr. Juel Burrow office and get an appointment quicker  This area needs to be taking care of- biopsy and removal

## 2018-12-27 ENCOUNTER — Encounter: Payer: Self-pay | Admitting: Family Medicine

## 2019-01-09 DIAGNOSIS — C44629 Squamous cell carcinoma of skin of left upper limb, including shoulder: Secondary | ICD-10-CM | POA: Diagnosis not present

## 2019-01-09 DIAGNOSIS — D225 Melanocytic nevi of trunk: Secondary | ICD-10-CM | POA: Diagnosis not present

## 2019-01-12 ENCOUNTER — Other Ambulatory Visit: Payer: Self-pay

## 2019-01-16 ENCOUNTER — Encounter: Payer: Self-pay | Admitting: Family Medicine

## 2019-01-18 ENCOUNTER — Other Ambulatory Visit: Payer: Self-pay | Admitting: Family Medicine

## 2019-01-19 NOTE — Telephone Encounter (Signed)
5 refills

## 2019-02-09 ENCOUNTER — Telehealth: Payer: Self-pay | Admitting: Family Medicine

## 2019-02-09 NOTE — Telephone Encounter (Signed)
Please call the patient's daughter-Pam  Let her know I did look over the glucose readings and I agree that they are too elevated  Please verify with her the current dosage of the long-acting insulin I believe he is on 2 shots a day but I need to know the exact amount so I can recommend an adjustment  Finally I would like for Kena to do his lab work this fall-the orders are in the system he could complete these before his appointment with the cardiologist and he could do a visit with [place] in early [place] would prefer for this to be in person but I am willing to do it at his car if that works out better for him

## 2019-02-10 NOTE — Telephone Encounter (Signed)
Daughter advised that Dr Nicki Reaper recommends increasing Levemir to 54 units in am and continuing with 64 units in the pm. Daughter verbalized understanding.

## 2019-02-10 NOTE — Telephone Encounter (Signed)
Currently based on these findings I would recommend increasing the morning insulin to 54 of the Levemir continue 60 4 in the evening

## 2019-02-10 NOTE — Telephone Encounter (Addendum)
Daughter(DPR) states patient does Levemir 50 units in the am and 64 units in the pm and Novalog 25 units before meals Patient scheduled follow up with Dr Nicki Reaper in early October and will complete blood work before visit

## 2019-02-10 NOTE — Telephone Encounter (Signed)
Left message to return call 

## 2019-02-23 ENCOUNTER — Other Ambulatory Visit: Payer: Self-pay | Admitting: Family Medicine

## 2019-03-14 ENCOUNTER — Ambulatory Visit (INDEPENDENT_AMBULATORY_CARE_PROVIDER_SITE_OTHER): Payer: Medicare Other | Admitting: Cardiology

## 2019-03-14 ENCOUNTER — Encounter: Payer: Self-pay | Admitting: Cardiology

## 2019-03-14 ENCOUNTER — Other Ambulatory Visit: Payer: Self-pay

## 2019-03-14 VITALS — BP 168/76 | HR 66 | Temp 97.2°F | Ht 71.0 in | Wt 275.0 lb

## 2019-03-14 DIAGNOSIS — E782 Mixed hyperlipidemia: Secondary | ICD-10-CM | POA: Diagnosis not present

## 2019-03-14 DIAGNOSIS — I25119 Atherosclerotic heart disease of native coronary artery with unspecified angina pectoris: Secondary | ICD-10-CM

## 2019-03-14 DIAGNOSIS — I10 Essential (primary) hypertension: Secondary | ICD-10-CM | POA: Diagnosis not present

## 2019-03-14 DIAGNOSIS — E1165 Type 2 diabetes mellitus with hyperglycemia: Secondary | ICD-10-CM

## 2019-03-14 NOTE — Patient Instructions (Signed)
Medication Instructions: Your physician recommends that you continue on your current medications as directed. Please refer to the Current Medication list given to you today.   Labwork: None today  Procedures/Testing: None  Follow-Up:  6 months with Dr.McDowell  Any Additional Special Instructions Will Be Listed Below (If Applicable).     If you need a refill on your cardiac medications before your next appointment, please call your pharmacy.     Thank you for choosing [facility] !

## 2019-03-14 NOTE — Progress Notes (Signed)
Cardiology Office Note  Date: 03/14/2019   ID: Zekiel, Ferrelli 08/10/1940, MRN [id number]  PCP:  [name], MD  Cardiologist:  Rozann Lesches, MD Electrophysiologist:  None   Chief Complaint  Patient presents with  . Cardiac follow-up    History of Present Illness: Francisco Powell is a 78 y.o. male last seen in March 2019.  He is here for a follow-up visit today with his daughter Loss adjuster, chartered).  He is fairly limited at baseline, uses a walker and is in a wheelchair today.  He has chronic leg pain and weakness, also unsteady on his feet.  He does not report any angina symptoms or nitroglycerin use at this point.  He continues to follow-up with Dr. Wolfgang Phoenix.  He is due for follow-up lab work and an office visit next month.  I reviewed his medications which are outlined below.  No change from a cardiac perspective.  His blood pressure was elevated today.  I personally reviewed his ECG today which shows sinus rhythm with prolonged PR interval, right bundle branch block and left anterior fascicular block.  Past Medical History:  Diagnosis Date  . Anemia, iron deficiency   . Anxiety   . Asbestosis(501)   . Coronary atherosclerosis of native coronary artery    DES RCA and staged DES LAD May 2015, LVEF 55%  . Essential hypertension, benign   . Gout   . Hx of adenomatous colonic polyps   . Hyperlipidemia   . Kidney carcinoma ([place]) 1998  . Lumbar back pain    Spondylosis and spondylolisthesis   . Myocardial infarction ([place]) 2004  . Nephrolithiasis   . NSTEMI (non-ST elevated myocardial infarction) [facility])    May 2015  . Obesity   . Sciatica of right side   . Type II diabetes mellitus ([place])     Past Surgical History:  Procedure Laterality Date  . APPENDECTOMY    . BACK SURGERY  X 4  . CHOLECYSTECTOMY     Biliary dyskinesia  . COLONOSCOPY  2009   Dr. Arnoldo Morale: normal  . COLONOSCOPY  June 2011   Dr. Gala Romney: normal rectum, left-sided diverticula  . COLONOSCOPY N/A  04/12/2015   Procedure: COLONOSCOPY;  Surgeon: [name], MD;  Location[facility];  Service: Endoscopy;  Laterality: N/A;  1115   . CYSTOSCOPY W/ LITHOLAPAXY / EHL  2009  . ESOPHAGOGASTRODUODENOSCOPY  June 2011   Dr. Gala Romney: erosive reflux esophagitis, small hiatal hernia, +H.pylori gastritis  . EYE SURGERY    . HOLMIUM LASER APPLICATION Right [id number]  . LEFT HEART CATHETERIZATION WITH CORONARY ANGIOGRAM N/A 11/09/2013   Procedure: LEFT HEART CATHETERIZATION WITH CORONARY ANGIOGRAM;  Surgeon: [name], MD;  Location: [facility] CATH LAB;  Service: Cardiovascular;  Laterality: N/A;  . LUMBAR DISC SURGERY  X 3  . PERCUTANEOUS CORONARY STENT INTERVENTION (PCI-S) N/A 11/10/2013   Procedure: PERCUTANEOUS CORONARY STENT INTERVENTION (PCI-S);  Surgeon: [name], MD;  Location: [facility] CATH LAB;  Service: Cardiovascular;  Laterality: N/A;  . POSTERIOR LUMBAR FUSION  2014  . RETINAL DETACHMENT SURGERY Right 1998   Secondary to injury detached retina/cataracts  . TONSILLECTOMY    . TUMOR EXCISION  1995    Current Outpatient Medications  Medication Sig Dispense Refill  . ALPRAZolam (XANAX) 1 MG tablet Take 0.5-1 tablets (0.5-1 mg total) by mouth at bedtime as needed. for sleep 30 tablet 5  . amLODipine (NORVASC) 10 MG tablet TAKE (1) TABLET BY MOUTH ONCE DAILY.  90 tablet 1  . aspirin (ASPIRIN 81) 81 MG chewable tablet Chew 81 mg by mouth daily.    . B-D ULTRAFINE III SHORT PEN 31G X 8 MM MISC USE AS DIRECTED. 100 each 5  . carvedilol (COREG) 25 MG tablet TAKE (1) TABLET BY MOUTH TWICE DAILY. 180 tablet 1  . Diphenhydramine-APAP, sleep, (TYLENOL PM EXTRA STRENGTH PO) Take 1 tablet by mouth every 4 (four) hours as needed.    . DULoxetine (CYMBALTA) 60 MG capsule Take 1 capsule (60 mg total) by mouth daily. 90 capsule 1  . gabapentin (NEURONTIN) 100 MG capsule Take 1 capsule (100 mg total) by mouth at bedtime. 90 capsule 1  . GLUCERNA (GLUCERNA) LIQD Take 237 mLs by mouth 2 (two) times daily  between meals.    . insulin detemir (LEVEMIR) 100 UNIT/ML injection INJECT 50 UNITS INTO THE SKIN TWICE DAILY. 20 mL 5  . lidocaine (XYLOCAINE) 2 % jelly Apply 1 application topically as needed. 30 mL 2  . lisinopril (ZESTRIL) 40 MG tablet Take 1 tablet (40 mg total) by mouth daily. 90 tablet 1  . Melatonin 10 MG TABS Take 1 tablet by mouth at bedtime.    . metFORMIN (GLUCOPHAGE) 1000 MG tablet TAKE 1 TABLET BY MOUTH TWICE DAILY WITH A MEAL. 180 tablet 1  . Multiple Vitamins-Minerals (MULTIVITAMIN WITH MINERALS) tablet Take 1 tablet by mouth daily.    . nitroGLYCERIN (NITROSTAT) 0.4 MG SL tablet Place 1 tablet (0.4 mg total) under the tongue every 5 (five) minutes x 3 doses as needed for chest pain. 25 tablet 4  . NOVOLOG FLEXPEN 100 UNIT/ML FlexPen INJECT 25 UNITS INTO THE SKIN 3 TIMES DAILY. 15 mL 0  . OVER THE COUNTER MEDICATION Take 1 tablet by mouth as needed (constipation).    . polyethylene glycol (MIRALAX / GLYCOLAX) packet Take 17 g by mouth daily as needed for mild constipation. 14 each 0  . pravastatin (PRAVACHOL) 80 MG tablet TAKE 1 TABLET BY MOUTH AT BEDTIME FOR CHOLESTEROL 90 tablet 1   No current facility-administered medications for this visit.    Allergies:  Penicillins, Other, Atorvastatin, and Celexa [citalopram]   Social History: The patient  reports that he has never smoked. His smokeless tobacco use includes chew. He reports that he does not drink alcohol or use drugs.   ROS:  Please see the history of present illness. Otherwise, complete review of systems is positive for bowel incontinence.  All other systems are reviewed and negative.   Physical Exam: VS:  BP (!) 168/76   Pulse 66   Temp (!) 97.2 F (36.2 C)   Ht 5\' 11"  (1.803 m)   Wt 275 lb (124.7 kg)   SpO2 98%   BMI 38.35 kg/m , BMI Body mass index is 38.35 kg/m.  Wt Readings from Last 3 Encounters:  03/14/19 275 lb (124.7 kg)  03/01/18 260 lb 0.6 oz (118 kg)  08/23/17 269 lb 12.8 oz (122.4 kg)     General: Morbidly obese male, appears comfortable at rest. HEENT: Conjunctiva and lids normal, wearing a mask. Neck: Supple, increased girth, no carotid bruits, no thyromegaly. Lungs: Clear to auscultation, nonlabored breathing at rest. Cardiac: Regular rate and rhythm, no S3 or significant systolic murmur, no pericardial rub. Abdomen: Protuberant, nontender, bowel sounds present. Extremities: Mild chronic appearing lower leg edema, distal pulses 1- 2+. Skin: Warm and dry. Musculoskeletal: No kyphosis. Neuropsychiatric: Alert and oriented x3, affect grossly appropriate.  ECG:  An ECG dated 07/24/2017 was personally  reviewed today and demonstrated:  Sinus rhythm with prolonged PR interval and IVCD, lead motion artifact.  Recent Labwork:    Component Value Date/Time   CHOL 138 08/18/2017 0815   TRIG 193 (H) 08/18/2017 0815   HDL 39 (L) 08/18/2017 0815   CHOLHDL 3.5 08/18/2017 0815   CHOLHDL 4.5 02/21/2014 0745   VLDL 55 (H) 02/21/2014 0745   LDLCALC 60 08/18/2017 0815  September 2019: BUN 16, creatinine 0.94, potassium 4.7, hemoglobin A1c 6.6%  Other Studies Reviewed Today:  Lexiscan Myoview 09/04/2011: IMPRESSION: Abnormal Lexiscan Myoview as outlined in patient with reported history of CAD. There were no clearly diagnostic ST-segment abnormalities with right bundle branch block and left anterior fascicular block noted at baseline. Perfusion imaging shows evidence of soft tissue attenuation versus scar in the inferolateral wall, no definite ischemia however. LVEF is calculated at 42% with relatively diffuse hypokinesis.  Echocardiogram 09/09/2011: Study Conclusions  - Left ventricle: The cavity size was at the upper limits of normal. Wall thickness was increased in a pattern of mild LVH. Systolic function was normal. The estimated ejection fraction was in the range of 60% to 65%. Wall motion was normal; there were no regional wall motion abnormalities. Doppler  parameters are consistent with abnormal left ventricular relaxation (grade 1 diastolic dysfunction). - Ventricular septum: Septal motion showed paradox. - Aorta: Aortic root dimension: 90mm (ED). - Aortic root: The aortic root was mildly dilated. - Mitral valve: Trivial regurgitation. - Left atrium: The atrium was mildly dilated. - Tricuspid valve: Mild regurgitation. - Pericardium, extracardiac: There was no pericardial effusion.  Assessment and Plan:  1.  CAD status post DES to the RCA and DES to the LAD in 2015.  We continue with observation on medical therapy in the absence of progressive angina symptoms.  ECG reviewed.  Continue aspirin, Coreg, Norvasc, lisinopril, statin, as needed nitroglycerin.  2.  Essential hypertension, blood pressure is elevated today.  This is complicated by inactivity and obesity as well.  Continue with current medications and follow-up with PCP.  3.  Mixed hyperlipidemia, he remains on Pravachol.  Follow-up lab work pending with Dr. Wolfgang Phoenix.  4.  Uncontrolled type 2 diabetes mellitus, last hemoglobin A1c down to 6.6%.  Continue follow-up with Dr. Wolfgang Phoenix.  Medication Adjustments/Labs and Tests Ordered: Current medicines are reviewed at length with the patient today.  Concerns regarding medicines are outlined above.   Tests Ordered: Orders Placed This Encounter  Procedures  . EKG 12-Lead    Medication Changes: No orders of the defined types were placed in this encounter.   Disposition:  Follow up 6 months in the [place] office.  Signed, [name], MD, [facility] 03/14/2019 4:39 PM    [facility] [address], [place], [place] [address] Phone: [phone]; Fax: [phone]

## 2019-03-21 ENCOUNTER — Other Ambulatory Visit: Payer: Self-pay | Admitting: Family Medicine

## 2019-03-22 ENCOUNTER — Encounter: Payer: Self-pay | Admitting: Family Medicine

## 2019-03-22 NOTE — Telephone Encounter (Signed)
Nurses please go ahead and send in refill with 5 additional refills  May send family MyChart message letting him know this is been completed thank you

## 2019-03-22 NOTE — Telephone Encounter (Signed)
I believe this is a duplicate I fielded a message earlier today authorizing refills

## 2019-03-23 DIAGNOSIS — E7849 Other hyperlipidemia: Secondary | ICD-10-CM | POA: Diagnosis not present

## 2019-03-23 DIAGNOSIS — I10 Essential (primary) hypertension: Secondary | ICD-10-CM | POA: Diagnosis not present

## 2019-03-23 DIAGNOSIS — Z794 Long term (current) use of insulin: Secondary | ICD-10-CM | POA: Diagnosis not present

## 2019-03-23 DIAGNOSIS — E119 Type 2 diabetes mellitus without complications: Secondary | ICD-10-CM | POA: Diagnosis not present

## 2019-03-24 LAB — BASIC METABOLIC PANEL
BUN/Creatinine Ratio: 16 (ref 10–24)
BUN: 17 mg/dL (ref 8–27)
CO2: 28 mmol/L (ref 20–29)
Calcium: 9.6 mg/dL (ref 8.6–10.2)
Chloride: 102 mmol/L (ref 96–106)
Creatinine, Ser: 1.06 mg/dL (ref 0.76–1.27)
GFR calc Af Amer: 79 mL/min/{1.73_m2} (ref >59)
GFR calc non Af Amer: 68 mL/min/{1.73_m2} (ref >59)
Glucose: 166 mg/dL — ABNORMAL HIGH (ref 65–99)
Potassium: 4 mmol/L (ref 3.5–5.2)
Sodium: 144 mmol/L (ref 134–144)

## 2019-03-24 LAB — LIPID PANEL
Chol/HDL Ratio: 3.6 ratio (ref 0.0–5.0)
Cholesterol, Total: 134 mg/dL (ref 100–199)
HDL: 37 mg/dL — ABNORMAL LOW (ref >39)
LDL Chol Calc (NIH): 61 mg/dL (ref 0–99)
Triglycerides: 224 mg/dL — ABNORMAL HIGH (ref 0–149)
VLDL Cholesterol Cal: 36 mg/dL (ref 5–40)

## 2019-03-24 LAB — MICROALBUMIN / CREATININE URINE RATIO
Creatinine, Urine: 65.3 mg/dL
Microalb/Creat Ratio: 603 mg/g creat — ABNORMAL HIGH (ref 0–29)
Microalbumin, Urine: 393.9 ug/mL

## 2019-03-24 LAB — HEPATIC FUNCTION PANEL
ALT: 18 IU/L (ref 0–44)
AST: 19 IU/L (ref 0–40)
Albumin: 4.5 g/dL (ref 3.7–4.7)
Alkaline Phosphatase: 64 IU/L (ref 39–117)
Bilirubin Total: 0.3 mg/dL (ref 0.0–1.2)
Bilirubin, Direct: 0.12 mg/dL (ref 0.00–0.40)
Total Protein: 6.8 g/dL (ref 6.0–8.5)

## 2019-03-24 LAB — HEMOGLOBIN A1C
Est. average glucose Bld gHb Est-mCnc: 151 mg/dL
Hgb A1c MFr Bld: 6.9 % — ABNORMAL HIGH (ref 4.8–5.6)

## 2019-03-27 NOTE — Telephone Encounter (Signed)
Glucose and HgbA1c are up compared with last check - I know Dr. Wolfgang Phoenix will continue to help work on that. LDL 61 is good and LFTs are normal. No change in cardiac regimen.

## 2019-04-10 ENCOUNTER — Other Ambulatory Visit: Payer: Self-pay | Admitting: Family Medicine

## 2019-04-11 ENCOUNTER — Other Ambulatory Visit: Payer: Self-pay

## 2019-04-11 ENCOUNTER — Ambulatory Visit (INDEPENDENT_AMBULATORY_CARE_PROVIDER_SITE_OTHER): Payer: Medicare Other | Admitting: Family Medicine

## 2019-04-11 ENCOUNTER — Other Ambulatory Visit: Payer: Self-pay | Admitting: Family Medicine

## 2019-04-11 VITALS — BP 134/78

## 2019-04-11 DIAGNOSIS — I10 Essential (primary) hypertension: Secondary | ICD-10-CM | POA: Diagnosis not present

## 2019-04-11 DIAGNOSIS — Z23 Encounter for immunization: Secondary | ICD-10-CM

## 2019-04-11 DIAGNOSIS — Z794 Long term (current) use of insulin: Secondary | ICD-10-CM | POA: Diagnosis not present

## 2019-04-11 DIAGNOSIS — E7849 Other hyperlipidemia: Secondary | ICD-10-CM

## 2019-04-11 DIAGNOSIS — I25119 Atherosclerotic heart disease of native coronary artery with unspecified angina pectoris: Secondary | ICD-10-CM | POA: Diagnosis not present

## 2019-04-11 DIAGNOSIS — E1140 Type 2 diabetes mellitus with diabetic neuropathy, unspecified: Secondary | ICD-10-CM | POA: Diagnosis not present

## 2019-04-11 DIAGNOSIS — G4700 Insomnia, unspecified: Secondary | ICD-10-CM | POA: Diagnosis not present

## 2019-04-11 MED ORDER — ALPRAZOLAM 1 MG PO TABS: 0.5000 mg | ORAL_TABLET | Freq: Every evening | ORAL | 5 refills | Status: DC | PRN

## 2019-04-11 MED ORDER — LISINOPRIL 40 MG PO TABS: 40.0000 mg | ORAL_TABLET | Freq: Every day | ORAL | 1 refills | Status: DC

## 2019-04-11 MED ORDER — GABAPENTIN 300 MG PO CAPS: 300.0000 mg | ORAL_CAPSULE | Freq: Every day | ORAL | 1 refills | Status: DC

## 2019-04-11 MED ORDER — METFORMIN HCL 500 MG PO TABS: ORAL_TABLET | ORAL | 1 refills | Status: DC

## 2019-04-11 MED ORDER — NOVOLOG FLEXPEN 100 UNIT/ML ~~LOC~~ SOPN: PEN_INJECTOR | SUBCUTANEOUS | 5 refills | Status: DC

## 2019-04-11 MED ORDER — DULOXETINE HCL 60 MG PO CPEP: 60.0000 mg | ORAL_CAPSULE | Freq: Every day | ORAL | 1 refills | Status: DC

## 2019-04-11 MED ORDER — CARVEDILOL 25 MG PO TABS: ORAL_TABLET | ORAL | 1 refills | Status: DC

## 2019-04-11 MED ORDER — INSULIN DETEMIR 100 UNIT/ML ~~LOC~~ SOLN: SUBCUTANEOUS | 5 refills | Status: DC

## 2019-04-11 MED ORDER — AMLODIPINE BESYLATE 10 MG PO TABS: ORAL_TABLET | ORAL | 1 refills | Status: DC

## 2019-04-11 MED ORDER — PRAVASTATIN SODIUM 80 MG PO TABS: ORAL_TABLET | ORAL | 1 refills | Status: DC

## 2019-04-11 NOTE — Progress Notes (Signed)
Subjective:    Patient ID: Francisco Powell, male    DOB: 1940-10-22, 78 y.o.   MRN: [id number]  HPIpt arrives with daughter Francisco Powell. For med check up. A1C done on bloodwork on 03/23/19 and it was 6.9.  Pt wants his flu vaccine today.  Feet have been hurting more than normal.   Fell 2 weeks ago and hurt back but that seems to be better.   Having issues with stool incontinence. Sometimes cannot make it to bathroom and sometimes does not know he has to go.   Patient does take his blood pressure medicine regular basis watches salt in the diet blood pressure overall doing well  Patient does have hyperlipidemia takes his medication.  Tries watch diet to some degree.  Does do regular lab work.  Previous labs looked overall pretty good  Diabetes patient does try to watch starches in diet does take his medicine denies any low sugar spells.  Sugars been under reasonable good control.  Recent lab work was good.        Review of Systems  Constitutional: Negative for diaphoresis and fatigue.  HENT: Negative for congestion and rhinorrhea.   Respiratory: Negative for cough and shortness of breath.   Cardiovascular: Negative for chest pain and leg swelling.  Gastrointestinal: Negative for abdominal pain and diarrhea.  Musculoskeletal: Positive for arthralgias.  Skin: Negative for color change and rash.  Neurological: Negative for dizziness and headaches.  Psychiatric/Behavioral: Negative for behavioral problems and confusion.       Objective:   Physical Exam Vitals signs reviewed.  Constitutional:      General: He is not in acute distress. HENT:     Head: Normocephalic and atraumatic.  Eyes:     General:        Right eye: No discharge.        Left eye: No discharge.  Neck:     Trachea: No tracheal deviation.  Cardiovascular:     Rate and Rhythm: Normal rate and regular rhythm.     Heart sounds: Normal heart sounds. No murmur.  Pulmonary:     Effort: Pulmonary effort is  normal. No respiratory distress.     Breath sounds: Normal breath sounds.  Lymphadenopathy:     Cervical: No cervical adenopathy.  Skin:    General: Skin is warm and dry.  Neurological:     Mental Status: He is alert.     Coordination: Coordination normal.  Psychiatric:        Behavior: Behavior normal.    Patient does have morbid obesity he has been counseled to minimize starches in diet try to stay active try to lose weight difficult for this patient Results for orders placed or performed in visit on 10/07/18  Lipid panel  Result Value Ref Range   Cholesterol, Total 134 100 - 199 mg/dL   Triglycerides 224 (H) 0 - 149 mg/dL   HDL 37 (L) >39 mg/dL   VLDL Cholesterol Cal 36 5 - 40 mg/dL   LDL Chol Calc (NIH) 61 0 - 99 mg/dL   Chol/HDL Ratio 3.6 0.0 - 5.0 ratio  Hepatic function panel  Result Value Ref Range   Total Protein 6.8 6.0 - 8.5 g/dL   Albumin 4.5 3.7 - 4.7 g/dL   Bilirubin Total 0.3 0.0 - 1.2 mg/dL   Bilirubin, Direct 0.12 0.00 - 0.40 mg/dL   Alkaline Phosphatase 64 39 - 117 IU/L   AST 19 0 - 40 IU/L   ALT 18 0 -  44 IU/L  Hemoglobin A1c  Result Value Ref Range   Hgb A1c MFr Bld 6.9 (H) 4.8 - 5.6 %   Est. average glucose Bld gHb Est-mCnc 151 mg/dL  Microalbumin / creatinine urine ratio  Result Value Ref Range   Creatinine, Urine 65.3 Not Estab. mg/dL   Microalbumin, Urine 393.9 Not Estab. ug/mL   Microalb/Creat Ratio 603 (H) 0 - 29 mg/g creat  Basic metabolic panel  Result Value Ref Range   Glucose 166 (H) 65 - 99 mg/dL   BUN 17 8 - 27 mg/dL   Creatinine, Ser 1.06 0.76 - 1.27 mg/dL   GFR calc non Af Amer 68 >59 mL/min/1.73   GFR calc Af Amer 79 >59 mL/min/1.73   BUN/Creatinine Ratio 16 10 - 24   Sodium 144 134 - 144 mmol/L   Potassium 4.0 3.5 - 5.2 mmol/L   Chloride 102 96 - 106 mmol/L   CO2 28 20 - 29 mmol/L   Calcium 9.6 8.6 - 10.2 mg/dL    Lab work reviewed with the patient      Assessment & Plan:  1. Need for vaccination Flu shot today - Flu  Vaccine QUAD 6+ mos PF IM (Fluarix Quad PF)  2. Essential hypertension Blood pressure good control continue current measures minimize starches minimize salt take medicines  3. Other hyperlipidemia Continue medication recent lab work overall look fairly good  4. Morbid obesity ([place]) Definitely try to minimize intake minimize eating patterns to try to lose weight  5. Type 2 diabetes mellitus with diabetic neuropathy, with long-term current use of insulin (HCC) Diabetes overall under fairly good control continue current measures minimize starches continue medications  6. Insomnia, unspecified type Use medication at nighttime intermittently.  Follow-up again in approximately 5 to 6 months sooner problems

## 2019-04-12 NOTE — Telephone Encounter (Signed)
This is a duplicate I sent in refills yesterday

## 2019-05-05 ENCOUNTER — Encounter: Payer: Self-pay | Admitting: Family Medicine

## 2019-05-08 MED ORDER — GABAPENTIN 300 MG PO CAPS: ORAL_CAPSULE | ORAL | 5 refills | Status: DC

## 2019-05-08 NOTE — Addendum Note (Signed)
Addended by: Vicente Males on: 05/08/2019 04:32 PM   Modules accepted: Orders

## 2019-05-08 NOTE — Telephone Encounter (Signed)
Nurses  Please change his gabapentin to 300 mg 1 taken twice daily, #60, 5 refills, cautioned drowsiness Please send this in Please notify family via [facility] that this was completed

## 2019-06-18 ENCOUNTER — Encounter: Payer: Self-pay | Admitting: Family Medicine

## 2019-06-19 NOTE — Telephone Encounter (Signed)
Nurses Please work with the family to go ahead and send in 6 months renewal on all of his medicines The nerve medication that he uses at bedtime can be pended and sent to me and I can sign it or if you are unable to do that send me a phone message requesting that I send the Xanax and to where please

## 2019-06-20 MED ORDER — NOVOLOG FLEXPEN 100 UNIT/ML ~~LOC~~ SOPN: PEN_INJECTOR | SUBCUTANEOUS | 5 refills | Status: DC

## 2019-06-20 MED ORDER — PRAVASTATIN SODIUM 80 MG PO TABS: ORAL_TABLET | ORAL | 1 refills | Status: DC

## 2019-06-20 MED ORDER — AMLODIPINE BESYLATE 10 MG PO TABS: ORAL_TABLET | ORAL | 1 refills | Status: DC

## 2019-06-20 MED ORDER — METFORMIN HCL 500 MG PO TABS: ORAL_TABLET | ORAL | 1 refills | Status: DC

## 2019-06-20 MED ORDER — BD PEN NEEDLE SHORT U/F 31G X 8 MM MISC: 5 refills | Status: DC

## 2019-06-20 MED ORDER — INSULIN DETEMIR 100 UNIT/ML ~~LOC~~ SOLN: SUBCUTANEOUS | 5 refills | Status: DC

## 2019-06-20 MED ORDER — DULOXETINE HCL 60 MG PO CPEP: 60.0000 mg | ORAL_CAPSULE | Freq: Every day | ORAL | 1 refills | Status: DC

## 2019-06-20 MED ORDER — ALPRAZOLAM 1 MG PO TABS: 0.5000 mg | ORAL_TABLET | Freq: Every evening | ORAL | 1 refills | Status: DC | PRN

## 2019-06-20 MED ORDER — LISINOPRIL 40 MG PO TABS: 40.0000 mg | ORAL_TABLET | Freq: Every day | ORAL | 1 refills | Status: DC

## 2019-06-20 MED ORDER — GABAPENTIN 300 MG PO CAPS: ORAL_CAPSULE | ORAL | 5 refills | Status: DC

## 2019-06-20 MED ORDER — CARVEDILOL 25 MG PO TABS: ORAL_TABLET | ORAL | 1 refills | Status: DC

## 2019-06-20 NOTE — Addendum Note (Signed)
Addended by: Sallee Lange A on: 06/20/2019 12:18 PM   Modules accepted: Orders

## 2019-06-20 NOTE — Addendum Note (Signed)
Addended by: Vicente Males on: 06/20/2019 08:52 AM   Modules accepted: Orders

## 2019-06-21 ENCOUNTER — Encounter: Payer: Self-pay | Admitting: Family Medicine

## 2019-06-27 ENCOUNTER — Telehealth: Payer: Self-pay | Admitting: *Deleted

## 2019-06-27 NOTE — Telephone Encounter (Signed)
Left message to return call.  Form at nurse station for clarification. Pt told pharm he uses levemir u -100 55 units in the am and 62 units in the pm. Epic has 50 units bid.

## 2019-06-28 ENCOUNTER — Other Ambulatory Visit: Payer: Self-pay | Admitting: Family Medicine

## 2019-06-28 MED ORDER — INSULIN DETEMIR 100 UNIT/ML ~~LOC~~ SOLN: SUBCUTANEOUS | 5 refills | Status: DC

## 2019-06-28 NOTE — Addendum Note (Signed)
Addended by: Vicente Males on: 06/28/2019 03:57 PM   Modules accepted: Orders

## 2019-06-28 NOTE — Telephone Encounter (Signed)
Nurses Please order enough syringes for the patient using 2 shots per day may have 90-day with refills  As for the insulin may have 90 days may titrate morning to a maximum of 65 units may titrate evening maximum 75 units It is fine to put the current dosing on his prescription as well 90-day with refill

## 2019-06-29 NOTE — Telephone Encounter (Signed)
Daughter sent my chart message and states that patient is taking 54 units am  and 62 units in afternoon. Refills sent to pharmacy

## 2019-07-04 ENCOUNTER — Encounter: Payer: Self-pay | Admitting: Family Medicine

## 2019-07-07 ENCOUNTER — Telehealth: Payer: Self-pay | Admitting: Family Medicine

## 2019-07-07 ENCOUNTER — Other Ambulatory Visit: Payer: Self-pay | Admitting: Family Medicine

## 2019-07-07 MED ORDER — INSULIN LISPRO 100 UNIT/ML ~~LOC~~ SOLN: SUBCUTANEOUS | 5 refills | Status: DC

## 2019-07-07 MED ORDER — MELOXICAM 7.5 MG PO TABS: 7.5000 mg | ORAL_TABLET | Freq: Every day | ORAL | 1 refills | Status: DC

## 2019-07-07 NOTE — Telephone Encounter (Signed)
Daughter stated she meant Meloxicam- she would like that sent to [facility] as a 90 day supply

## 2019-07-07 NOTE — Telephone Encounter (Signed)
Meloxicam 7.5, #90, 1 daily, 1 refill

## 2019-07-07 NOTE — Telephone Encounter (Signed)
Nurses I saw my chart message and responded to it regarding meloxicam But I did not see the MyChart message regarding tramadol? Please talk with the daughter find out what is up-I imagine she is requesting tramadol which we can send in Please clarify first  Then send back to me thank you and also which pharmacy thank you

## 2019-07-07 NOTE — Telephone Encounter (Signed)
Prescription sent electronically to pharmacy. Daughter DPR aware. 

## 2019-07-07 NOTE — Telephone Encounter (Signed)
Novolog changed to Humalog due to insurance causing change in insulin. Same dose as Humalog. Sent to [facility]. Notified Pam (daughter on [place]). Pam verbalized understanding.  Pam also inquiring about MyChart message regarding Tramadol.  Please advise. Thank you

## 2019-07-31 ENCOUNTER — Other Ambulatory Visit: Payer: Self-pay | Admitting: Family Medicine

## 2019-08-10 ENCOUNTER — Observation Stay (HOSPITAL_COMMUNITY)
Admission: EM | Admit: 2019-08-10 | Discharge: 2019-08-11 | Disposition: A | Discharge status: Home / Self Care | Payer: Medicare Other | Attending: Internal Medicine | Admitting: Internal Medicine

## 2019-08-10 ENCOUNTER — Observation Stay (HOSPITAL_BASED_OUTPATIENT_CLINIC_OR_DEPARTMENT_OTHER): Payer: Medicare Other

## 2019-08-10 ENCOUNTER — Encounter (HOSPITAL_COMMUNITY): Payer: Self-pay | Admitting: Emergency Medicine

## 2019-08-10 ENCOUNTER — Other Ambulatory Visit: Payer: Self-pay

## 2019-08-10 ENCOUNTER — Emergency Department (HOSPITAL_COMMUNITY): Payer: Medicare Other

## 2019-08-10 DIAGNOSIS — E785 Hyperlipidemia, unspecified: Secondary | ICD-10-CM | POA: Diagnosis not present

## 2019-08-10 DIAGNOSIS — Z79899 Other long term (current) drug therapy: Secondary | ICD-10-CM | POA: Insufficient documentation

## 2019-08-10 DIAGNOSIS — F329 Major depressive disorder, single episode, unspecified: Secondary | ICD-10-CM | POA: Diagnosis not present

## 2019-08-10 DIAGNOSIS — R079 Chest pain, unspecified: Secondary | ICD-10-CM

## 2019-08-10 DIAGNOSIS — F419 Anxiety disorder, unspecified: Secondary | ICD-10-CM | POA: Diagnosis not present

## 2019-08-10 DIAGNOSIS — I452 Bifascicular block: Secondary | ICD-10-CM | POA: Diagnosis not present

## 2019-08-10 DIAGNOSIS — E782 Mixed hyperlipidemia: Secondary | ICD-10-CM | POA: Diagnosis not present

## 2019-08-10 DIAGNOSIS — I252 Old myocardial infarction: Secondary | ICD-10-CM | POA: Diagnosis not present

## 2019-08-10 DIAGNOSIS — R0789 Other chest pain: Secondary | ICD-10-CM | POA: Diagnosis not present

## 2019-08-10 DIAGNOSIS — R2681 Unsteadiness on feet: Secondary | ICD-10-CM | POA: Diagnosis not present

## 2019-08-10 DIAGNOSIS — Z794 Long term (current) use of insulin: Secondary | ICD-10-CM | POA: Diagnosis not present

## 2019-08-10 DIAGNOSIS — R000 Tachycardia, unspecified: Secondary | ICD-10-CM | POA: Diagnosis not present

## 2019-08-10 DIAGNOSIS — R0902 Hypoxemia: Secondary | ICD-10-CM | POA: Diagnosis not present

## 2019-08-10 DIAGNOSIS — I499 Cardiac arrhythmia, unspecified: Secondary | ICD-10-CM

## 2019-08-10 DIAGNOSIS — I2510 Atherosclerotic heart disease of native coronary artery without angina pectoris: Secondary | ICD-10-CM | POA: Diagnosis not present

## 2019-08-10 DIAGNOSIS — F325 Major depressive disorder, single episode, in full remission: Secondary | ICD-10-CM | POA: Diagnosis present

## 2019-08-10 DIAGNOSIS — R0689 Other abnormalities of breathing: Secondary | ICD-10-CM | POA: Diagnosis not present

## 2019-08-10 DIAGNOSIS — I10 Essential (primary) hypertension: Secondary | ICD-10-CM | POA: Diagnosis not present

## 2019-08-10 DIAGNOSIS — I200 Unstable angina: Secondary | ICD-10-CM

## 2019-08-10 DIAGNOSIS — Z20822 Contact with and (suspected) exposure to covid-19: Secondary | ICD-10-CM | POA: Diagnosis not present

## 2019-08-10 DIAGNOSIS — Z7982 Long term (current) use of aspirin: Secondary | ICD-10-CM | POA: Diagnosis not present

## 2019-08-10 DIAGNOSIS — E1165 Type 2 diabetes mellitus with hyperglycemia: Secondary | ICD-10-CM | POA: Diagnosis not present

## 2019-08-10 DIAGNOSIS — E119 Type 2 diabetes mellitus without complications: Secondary | ICD-10-CM | POA: Insufficient documentation

## 2019-08-10 LAB — TROPONIN I (HIGH SENSITIVITY)
Troponin I (High Sensitivity): 10 ng/L (ref <18)
Troponin I (High Sensitivity): 11 ng/L (ref <18)
Troponin I (High Sensitivity): 12 ng/L (ref <18)
Troponin I (High Sensitivity): 13 ng/L (ref <18)

## 2019-08-10 LAB — COMPREHENSIVE METABOLIC PANEL
ALT: 17 U/L (ref 0–44)
AST: 21 U/L (ref 15–41)
Albumin: 3.9 g/dL (ref 3.5–5.0)
Alkaline Phosphatase: 55 U/L (ref 38–126)
Anion gap: 11 (ref 5–15)
BUN: 16 mg/dL (ref 8–23)
CO2: 26 mmol/L (ref 22–32)
Calcium: 9.1 mg/dL (ref 8.9–10.3)
Chloride: 104 mmol/L (ref 98–111)
Creatinine, Ser: 0.9 mg/dL (ref 0.61–1.24)
GFR calc Af Amer: >60 mL/min (ref >60)
GFR calc non Af Amer: >60 mL/min (ref >60)
Glucose, Bld: 223 mg/dL — ABNORMAL HIGH (ref 70–99)
Potassium: 3.7 mmol/L (ref 3.5–5.1)
Sodium: 141 mmol/L (ref 135–145)
Total Bilirubin: 0.5 mg/dL (ref 0.3–1.2)
Total Protein: 7.7 g/dL (ref 6.5–8.1)

## 2019-08-10 LAB — CBC WITH DIFFERENTIAL/PLATELET
Abs Immature Granulocytes: 0.06 10*3/uL (ref 0.00–0.07)
Basophils Absolute: 0.1 10*3/uL (ref 0.0–0.1)
Basophils Relative: 1 %
Eosinophils Absolute: 0.1 10*3/uL (ref 0.0–0.5)
Eosinophils Relative: 1 %
HCT: 44.4 % (ref 39.0–52.0)
Hemoglobin: 13.5 g/dL (ref 13.0–17.0)
Immature Granulocytes: 1 %
Lymphocytes Relative: 20 %
Lymphs Abs: 2.3 10*3/uL (ref 0.7–4.0)
MCH: 27.8 pg (ref 26.0–34.0)
MCHC: 30.4 g/dL (ref 30.0–36.0)
MCV: 91.5 fL (ref 80.0–100.0)
Monocytes Absolute: 1.1 10*3/uL — ABNORMAL HIGH (ref 0.1–1.0)
Monocytes Relative: 9 %
Neutro Abs: 7.8 10*3/uL — ABNORMAL HIGH (ref 1.7–7.7)
Neutrophils Relative %: 68 %
Platelets: 254 10*3/uL (ref 150–400)
RBC: 4.85 MIL/uL (ref 4.22–5.81)
RDW: 17 % — ABNORMAL HIGH (ref 11.5–15.5)
WBC: 11.4 10*3/uL — ABNORMAL HIGH (ref 4.0–10.5)
nRBC: 0 % (ref 0.0–0.2)

## 2019-08-10 LAB — GLUCOSE, CAPILLARY: Glucose-Capillary: 199 mg/dL — ABNORMAL HIGH (ref 70–99)

## 2019-08-10 LAB — SARS CORONAVIRUS 2 (TAT 6-24 HRS): SARS Coronavirus 2: NEGATIVE

## 2019-08-10 LAB — ECHOCARDIOGRAM COMPLETE
Height: 74 in
Weight: 4416 oz

## 2019-08-10 MED ORDER — ONDANSETRON HCL 4 MG/2ML IJ SOLN
4.0000 mg | Freq: Four times a day (QID) | INTRAMUSCULAR | Status: DC | PRN
  Administered 2019-08-10 – 2019-08-11 (×2): 4 mg via INTRAVENOUS
  Filled 2019-08-10 (×2): qty 2

## 2019-08-10 MED ORDER — PRAVASTATIN SODIUM 40 MG PO TABS: 80.0000 mg | ORAL_TABLET | Freq: Every day | ORAL | Status: DC

## 2019-08-10 MED ORDER — ENOXAPARIN SODIUM 40 MG/0.4ML ~~LOC~~ SOLN
40.0000 mg | SUBCUTANEOUS | Status: DC
  Administered 2019-08-10 (×2): 40 mg via SUBCUTANEOUS
  Filled 2019-08-10 (×2): qty 0.4

## 2019-08-10 MED ORDER — ACETAMINOPHEN 650 MG RE SUPP: 650.0000 mg | Freq: Four times a day (QID) | RECTAL | Status: DC | PRN

## 2019-08-10 MED ORDER — ASPIRIN 81 MG PO CHEW
81.0000 mg | CHEWABLE_TABLET | Freq: Every day | ORAL | Status: DC
  Administered 2019-08-11: 81 mg via ORAL
  Filled 2019-08-10: qty 1

## 2019-08-10 MED ORDER — MELATONIN 3 MG PO TABS
3.0000 mg | ORAL_TABLET | Freq: Once | ORAL | Status: AC
  Administered 2019-08-10: 3 mg via ORAL
  Filled 2019-08-10: qty 1

## 2019-08-10 MED ORDER — GABAPENTIN 300 MG PO CAPS
300.0000 mg | ORAL_CAPSULE | Freq: Two times a day (BID) | ORAL | Status: DC
  Administered 2019-08-10 – 2019-08-11 (×2): 300 mg via ORAL
  Filled 2019-08-10 (×2): qty 1

## 2019-08-10 MED ORDER — NITROGLYCERIN 0.4 MG SL SUBL: 0.4000 mg | SUBLINGUAL_TABLET | SUBLINGUAL | Status: DC | PRN

## 2019-08-10 MED ORDER — AMLODIPINE BESYLATE 5 MG PO TABS
10.0000 mg | ORAL_TABLET | Freq: Every day | ORAL | Status: DC
  Administered 2019-08-10 – 2019-08-11 (×2): 10 mg via ORAL
  Filled 2019-08-10 (×2): qty 2

## 2019-08-10 MED ORDER — HYDRALAZINE HCL 20 MG/ML IJ SOLN: 10.0000 mg | Freq: Four times a day (QID) | INTRAMUSCULAR | Status: DC | PRN

## 2019-08-10 MED ORDER — CARVEDILOL 12.5 MG PO TABS
25.0000 mg | ORAL_TABLET | Freq: Two times a day (BID) | ORAL | Status: DC
  Administered 2019-08-11: 25 mg via ORAL
  Filled 2019-08-10: qty 2

## 2019-08-10 MED ORDER — ACETAMINOPHEN 325 MG PO TABS: 650.0000 mg | ORAL_TABLET | Freq: Four times a day (QID) | ORAL | Status: DC | PRN

## 2019-08-10 MED ORDER — ONDANSETRON HCL 4 MG PO TABS: 4.0000 mg | ORAL_TABLET | Freq: Four times a day (QID) | ORAL | Status: DC | PRN

## 2019-08-10 MED ORDER — DULOXETINE HCL 60 MG PO CPEP
60.0000 mg | ORAL_CAPSULE | Freq: Every day | ORAL | Status: DC
  Administered 2019-08-10 – 2019-08-11 (×2): 60 mg via ORAL
  Filled 2019-08-10 (×2): qty 1

## 2019-08-10 MED ORDER — ALPRAZOLAM 0.5 MG PO TABS
0.5000 mg | ORAL_TABLET | Freq: Every evening | ORAL | Status: DC | PRN
  Administered 2019-08-10: 22:00:00 1 mg via ORAL
  Filled 2019-08-10: qty 2

## 2019-08-10 MED ORDER — LISINOPRIL 10 MG PO TABS
40.0000 mg | ORAL_TABLET | Freq: Every day | ORAL | Status: DC
  Administered 2019-08-10 – 2019-08-11 (×2): 40 mg via ORAL
  Filled 2019-08-10 (×2): qty 4

## 2019-08-10 MED ORDER — INSULIN DETEMIR 100 UNIT/ML ~~LOC~~ SOLN
40.0000 [IU] | Freq: Two times a day (BID) | SUBCUTANEOUS | Status: DC
  Administered 2019-08-10 – 2019-08-11 (×2): 40 [IU] via SUBCUTANEOUS
  Filled 2019-08-10 (×8): qty 0.4

## 2019-08-10 MED ORDER — NON FORMULARY: 3.0000 mg | Freq: Once | Status: DC

## 2019-08-10 NOTE — Progress Notes (Signed)
Cardiology Consultation:   Patient ID: Francisco Powell MRN: [id number]; DOB: April 20, 1941  Admit date: 08/10/2019 Date of Consult: 08/10/2019  Primary Care Provider: [name], MD Primary Cardiologist: Rozann Lesches, MD  Primary Electrophysiologist:  None    Patient Profile:   Francisco Powell is a 79 y.o. male with a hx of CAD with prior interventions  who is being seen today for the evaluation of chest pain at the request of Dr Roderic Palau.  History of Present Illness:   Francisco Powell 79 yo male history of CAD with prior DES to RCA and LAD in May 2015 in setting of NSTEMI, HTN, HL presents with chest pain  Around 9AM when waking up had a 6-7/10 left sided tightness in chest. No other associated symptoms. Not positional. He reports lasted a few seconds, however it lasted long enough for his daughter to come and see him and give him a nitroglycerin, very shortly after taking NG pain resolved. No recurrence. No recent prior symptoms. Sedentary lifestyle, limited by chronic severe back pains.   I did speak with the patient's daughter who is a Marine scientist, used to work [employer]. She confirms she was told by his caregiver he was having chest pains and high bp's this morning. Symptoms did improve after NG.    WBC 11.4 Hgb 13.5 plt 254 K 3.7 Cr 0.9  hstrop 10-->11 CXR no acute process EKG SR, RBBB, PACs    SH: daughter is Marine scientist, used to work at [employer]. Francisco Powell (cell [phone])   Heart Pathway Score:     Past Medical History:  Diagnosis Date  . Anemia, iron deficiency   . Anxiety   . Asbestosis(501)   . Coronary atherosclerosis of native coronary artery    DES RCA and staged DES LAD May 2015, LVEF 55%  . Essential hypertension, benign   . Gout   . Hx of adenomatous colonic polyps   . Hyperlipidemia   . Kidney carcinoma ([place]) 1998  . Lumbar back pain    Spondylosis and spondylolisthesis   . Myocardial infarction ([place]) 2004  . Nephrolithiasis   . NSTEMI (non-ST  elevated myocardial infarction) [facility])    May 2015  . Obesity   . Sciatica of right side   . Type II diabetes mellitus ([place])     Past Surgical History:  Procedure Laterality Date  . APPENDECTOMY    . BACK SURGERY  X 4  . CHOLECYSTECTOMY     Biliary dyskinesia  . COLONOSCOPY  2009   Dr. Arnoldo Morale: normal  . COLONOSCOPY  June 2011   Dr. Gala Romney: normal rectum, left-sided diverticula  . COLONOSCOPY N/A 04/12/2015   Procedure: COLONOSCOPY;  Surgeon: [name], MD;  [facility];  Service: Endoscopy;  Laterality: N/A;  1115   . CYSTOSCOPY W/ LITHOLAPAXY / EHL  2009  . ESOPHAGOGASTRODUODENOSCOPY  June 2011   Dr. Gala Romney: erosive reflux esophagitis, small hiatal hernia, +H.pylori gastritis  . EYE SURGERY    . HOLMIUM LASER APPLICATION Right [id number]  . LEFT HEART CATHETERIZATION WITH CORONARY ANGIOGRAM N/A 11/09/2013   Procedure: LEFT HEART CATHETERIZATION WITH CORONARY ANGIOGRAM;  Surgeon: [name], MD;  Location: [facility] CATH LAB;  Service: Cardiovascular;  Laterality: N/A;  . LUMBAR DISC SURGERY  X 3  . PERCUTANEOUS CORONARY STENT INTERVENTION (PCI-S) N/A 11/10/2013   Procedure: PERCUTANEOUS CORONARY STENT INTERVENTION (PCI-S);  Surgeon: [name], MD;  Location: [facility] CATH LAB;  Service: Cardiovascular;  Laterality: N/A;  . POSTERIOR LUMBAR FUSION  2014  . RETINAL DETACHMENT SURGERY Right 1998   Secondary to injury detached retina/cataracts  . TONSILLECTOMY    . TUMOR EXCISION  1995     Inpatient Medications: Scheduled Meds:  Continuous Infusions:  PRN Meds:   Allergies:    Allergies  Allergen Reactions  . Penicillins Hives, Itching and Swelling    Has patient had a PCN reaction causing immediate rash, facial/tongue/throat swelling, SOB or lightheadedness with hypotension: Unknown Has patient had a PCN reaction causing severe rash involving mucus membranes or skin necrosis: Unknown Has patient had a PCN reaction that required hospitalization: Unknown Has  patient had a PCN reaction occurring within the last 10 years: Unknown If all of the above answers are "NO", then may proceed with Cephalosporin use.   . Other     Bee stings  . Atorvastatin Other (See Comments)    Muscle pain/cramps   . Celexa [Citalopram] Diarrhea    Social History:   Social History   Socioeconomic History  . Marital status: Married    Spouse name: Not on file  . Number of children: Not on file  . Years of education: Not on file  . Highest education level: Not on file  Occupational History  . Not on file  Tobacco Use  . Smoking status: Never Smoker  . Smokeless tobacco: Current User    Types: Chew  Substance and Sexual Activity  . Alcohol use: No    Alcohol/week: 0.0 standard drinks  . Drug use: No  . Sexual activity: Not on file  Other Topics Concern  . Not on file  Social History Narrative  . Not on file   Social Determinants of Health   Financial Resource Strain:   . Difficulty of Paying Living Expenses: Not on file  Food Insecurity:   . Worried About Charity fundraiser in the Last Year: Not on file  . Ran Out of Food in the Last Year: Not on file  Transportation Needs:   . Lack of Transportation (Medical): Not on file  . Lack of Transportation (Non-Medical): Not on file  Physical Activity:   . Days of Exercise per Week: Not on file  . Minutes of Exercise per Session: Not on file  Stress:   . Feeling of Stress : Not on file  Social Connections:   . Frequency of Communication with Friends and Family: Not on file  . Frequency of Social Gatherings with Friends and Family: Not on file  . Attends Religious Services: Not on file  . Active Member of Clubs or Organizations: Not on file  . Attends Archivist Meetings: Not on file  . Marital Status: Not on file  Intimate Partner Violence:   . Fear of Current or Ex-Partner: Not on file  . Emotionally Abused: Not on file  . Physically Abused: Not on file  . Sexually Abused: Not on  file    Family History:    Family History  Problem Relation Age of Onset  . Colon cancer Mother        deceased   . Cancer Maternal Grandmother        Gastric cancer  . Breast cancer Sister   . Heart attack Father   . Colon cancer Maternal Uncle   . Colon cancer Maternal Uncle   . Colon cancer Other        maternal great uncle  . Colon polyps Sister  ROS:  Please see the history of present illness.  All other ROS reviewed and negative.     Physical Exam/Data:   Vitals:   08/10/19 1215 08/10/19 1230 08/10/19 1300 08/10/19 1330  BP:  (!) 181/96 (!) 179/92 (!) 176/90  Pulse: 72 73 70   Resp: 18 (!) 29 (!) 28 (!) 24  SpO2: 94% 94% 92%   Weight:      Height:       No intake or output data in the 24 hours ending 08/10/19 1423 Last 3 Weights 08/10/2019 03/14/2019 03/01/2018  Weight (lbs) 276 lb 275 lb 260 lb 0.6 oz  Weight (kg) 125.193 kg 124.739 kg 117.952 kg     Body mass index is 35.44 kg/m.  General:  Well nourished, well developed, in no acute distress HEENT: normal Lymph: no adenopathy Neck: no JVD Endocrine:  No thryomegaly Vascular: No carotid bruits; FA pulses 2+ bilaterally without bruits  Cardiac:  normal S1, S2; RRR; no murmur  Lungs:  clear to auscultation bilaterally, no wheezing, rhonchi or rales  Abd: soft, nontender, no hepatomegaly  Ext: no edema Musculoskeletal:  No deformities, BUE and BLE strength normal and equal Skin: warm and dry  Neuro:  CNs 2-12 intact, no focal abnormalities noted Psych:  Normal affect    Laboratory Data:  High Sensitivity Troponin:   Recent Labs  Lab 08/10/19 1108 08/10/19 1226  TROPONINIHS 10 11     Chemistry Recent Labs  Lab 08/10/19 1108  NA 141  K 3.7  CL 104  CO2 26  GLUCOSE 223*  BUN 16  CREATININE 0.90  CALCIUM 9.1  GFRNONAA >60  GFRAA >60  ANIONGAP 11    Recent Labs  Lab 08/10/19 1108  PROT 7.7  ALBUMIN 3.9  AST 21  ALT 17  ALKPHOS 55  BILITOT 0.5   Hematology Recent Labs  Lab  08/10/19 1108  WBC 11.4*  RBC 4.85  HGB 13.5  HCT 44.4  MCV 91.5  MCH 27.8  MCHC 30.4  RDW 17.0*  PLT 254   BNPNo results for input(s): BNP, PROBNP in the last 168 hours.  DDimer No results for input(s): DDIMER in the last 168 hours.   Radiology/Studies:  DG Chest Portable 1 View  Result Date: 08/10/2019 CLINICAL DATA:  Left lower chest pain beginning this morning. EXAM: PORTABLE CHEST 1 VIEW COMPARISON:  07/24/2017 FINDINGS: Cardiac silhouette is normal in size. No mediastinal or hilar masses. No evidence of adenopathy. Clear lungs.  No pleural effusion or pneumothorax. Skeletal structures are grossly intact. IMPRESSION: No active disease. Electronically Signed   By: Lajean Manes M.D.   On: 08/10/2019 10:34       HEART Score (for undifferentiated chest pain):  5  Assessment and Plan:   1. Chest pain/CAD - history of CAD with prior NSTEMI with prior DES to RCA and LAD in May 2015  - first episode of chest pain in recent memory for him. Left sided tightness with no other assocaited symptoms, resovled shortly after taking NG. No recurrence - EKG SR, RBBB no ischemic changes. Trops negative thus far.  - given his prior history and some of the characteristics of the pain would monitor overnight on observations, repeat trops. Obtain echocardiogram, repeat EKG in AM - if negative workup would consider outpatient stress testing. If abnormal findings or significant recurrence of pain may require inpatient cath.  - make npo at midnight in case further testing needed.   2. HTN - follow with home regimen  intiially, pending trends may require further adjustemtn.      daughter is Marine scientist, used to work at [employer]. Francisco Powell (cell [phone]). Please update tomorrow AM   For questions or updates, please contact [place] Please consult [email or web address] for contact info under     Signed, Carlyle Dolly, MD  08/10/2019 2:23 PM

## 2019-08-10 NOTE — H&P (Signed)
History and Physical  Francisco Powell [id number] DOB: 03-23-1941 DOA: 08/10/2019   PCP: [name], MD   Patient coming from: Home  Chief Complaint: chest pain  HPI:  Francisco Powell is a 79 y.o. male with medical history of coronary artery disease, hypertension, hyperlipidemia, diabetes mellitus type 2, depression, iron deficiency presenting with left-sided chest pain that began in the morning of 08/10/2019 while he was lying in bed.  The patient stated that it lasted 3 to 4 minutes.  He denied any associated symptoms including dizziness, diaphoresis, shortness of breath, nausea, vomiting.  He took 1 sublingual nitroglycerin with improvement of his chest discomfort.  However, he states that at the same time he had a belching spell which also relieved his chest discomfort.  The patient denies any recurrent chest discomfort prior to the episode described above.  He is compliant with all his medications.  At baseline, the patient has poor functional status and uses a walker and wheelchair.  He denies any recent fevers, chills, headache, shortness breath, coughing, hemoptysis, nausea, vomiting, diarrhea, abdominal pain, dysuria, hematuria. In the emergency department, the patient was afebrile hemodynamically stable with oxygen saturation 94% on room air.  Assessment/Plan: Chest pain -Seems atypical by clinical history -May been also incited by the patient's uncontrolled blood pressure -Troponin 10>>> 11 -Cycle troponins -EKG with unchanged right bundle branch block -Echocardiogram -Cardiology consult -Continue aspirin  Essential hypertension -Continue amlodipine, lisinopril, carvedilol -Hydralazine as needed > SBP>180  Diabetes mellitus type 2, uncontrolled with hyperglycemia -03/23/2019 hemoglobin A1c 6.9 -Repeat hemoglobin A1c -Reduced dose Lantus -NovoLog sliding scale -Holding metformin  Hyperlipidemia -Continue statin -Check lipid panel in a.m.  Coronary artery  disease -History of DES to the RCA and LAD 2015 -No chest pain present -continue aspirin, carvedilol, statin  Depression/anxiety -Continue Cymbalta and a home dose alprazolam       Past Medical History:  Diagnosis Date  . Anemia, iron deficiency   . Anxiety   . Asbestosis(501)   . Coronary atherosclerosis of native coronary artery    DES RCA and staged DES LAD May 2015, LVEF 55%  . Essential hypertension, benign   . Gout   . Hx of adenomatous colonic polyps   . Hyperlipidemia   . Kidney carcinoma ([place]) 1998  . Lumbar back pain    Spondylosis and spondylolisthesis   . Myocardial infarction ([place]) 2004  . Nephrolithiasis   . NSTEMI (non-ST elevated myocardial infarction) [facility])    May 2015  . Obesity   . Sciatica of right side   . Type II diabetes mellitus ([name])    Past Surgical History:  Procedure Laterality Date  . APPENDECTOMY    . BACK SURGERY  X 4  . CHOLECYSTECTOMY     Biliary dyskinesia  . COLONOSCOPY  2009   Dr. Arnoldo Morale: normal  . COLONOSCOPY  June 2011   Dr. Gala Romney: normal rectum, left-sided diverticula  . COLONOSCOPY N/A 04/12/2015   Procedure: COLONOSCOPY;  Surgeon: [name], MD;  [facility];  Service: Endoscopy;  Laterality: N/A;  1115   . CYSTOSCOPY W/ LITHOLAPAXY / EHL  2009  . ESOPHAGOGASTRODUODENOSCOPY  June 2011   Dr. Gala Romney: erosive reflux esophagitis, small hiatal hernia, +H.pylori gastritis  . EYE SURGERY    . HOLMIUM LASER APPLICATION Right [id number]  . LEFT HEART CATHETERIZATION WITH CORONARY ANGIOGRAM N/A 11/09/2013   Procedure: LEFT HEART CATHETERIZATION WITH CORONARY ANGIOGRAM;  Surgeon: [name], MD;  Location: [facility]  CATH LAB;  Service: Cardiovascular;  Laterality: N/A;  . LUMBAR DISC SURGERY  X 3  . PERCUTANEOUS CORONARY STENT INTERVENTION (PCI-S) N/A 11/10/2013   Procedure: PERCUTANEOUS CORONARY STENT INTERVENTION (PCI-S);  Surgeon: [name], MD;  Location: [facility] CATH LAB;  Service: Cardiovascular;  Laterality: N/A;   . POSTERIOR LUMBAR FUSION  2014  . RETINAL DETACHMENT SURGERY Right 1998   Secondary to injury detached retina/cataracts  . TONSILLECTOMY    . TUMOR EXCISION  1995   Social History:  reports that he has never smoked. His smokeless tobacco use includes chew. He reports that he does not drink alcohol or use drugs.   Family History  Problem Relation Age of Onset  . Colon cancer Mother        deceased   . Cancer Maternal Grandmother        Gastric cancer  . Breast cancer Sister   . Heart attack Father   . Colon cancer Maternal Uncle   . Colon cancer Maternal Uncle   . Colon cancer Other        maternal great uncle  . Colon polyps Sister      Allergies  Allergen Reactions  . Penicillins Hives, Itching and Swelling    Has patient had a PCN reaction causing immediate rash, facial/tongue/throat swelling, SOB or lightheadedness with hypotension: Unknown Has patient had a PCN reaction causing severe rash involving mucus membranes or skin necrosis: Unknown Has patient had a PCN reaction that required hospitalization: Unknown Has patient had a PCN reaction occurring within the last 10 years: Unknown If all of the above answers are "NO", then may proceed with Cephalosporin use.   . Other     Bee stings  . Atorvastatin Other (See Comments)    Muscle pain/cramps   . Celexa [Citalopram] Diarrhea     Prior to Admission medications   Medication Sig Start Date End Date Taking? Authorizing Provider  ALPRAZolam Duanne Moron) 1 MG tablet Take 0.5-1 tablets (0.5-1 mg total) by mouth at bedtime as needed. for sleep 06/20/19  Yes Luking, Scott A, MD  amLODipine (NORVASC) 10 MG tablet TAKE (1) TABLET BY MOUTH ONCE DAILY. 06/20/19  Yes [name], MD  aspirin (ASPIRIN 81) 81 MG chewable tablet Chew 81 mg by mouth daily.   Yes [provider]  carvedilol (COREG) 25 MG tablet TAKE (1) TABLET BY MOUTH TWICE DAILY. 06/20/19  Yes Luking, Elayne Snare, MD  DULoxetine (CYMBALTA) 60 MG capsule Take 1  capsule (60 mg total) by mouth daily. 06/20/19  Yes Luking, Elayne Snare, MD  gabapentin (NEURONTIN) 300 MG capsule Take one capsule po twice daily. CAUTION:DROWSINESS 06/20/19  Yes Luking, Nicki Reaper A, MD  insulin detemir (LEVEMIR) 100 UNIT/ML injection INJECT SUBCUTANEOUSLY 54  UNITS IN THE MORNING AND 62 UNITS IN THE EVENING 08/01/19  Yes Luking, Scott A, MD  insulin lispro (HUMALOG) 100 UNIT/ML injection Inject 25 units into skin 3 times per day 07/07/19  Yes Luking, Scott A, MD  lidocaine (XYLOCAINE) 2 % jelly Apply 1 application topically as needed. 08/04/17  Yes [name], MD  lisinopril (ZESTRIL) 40 MG tablet Take 1 tablet (40 mg total) by mouth daily. 06/20/19  Yes Luking, Elayne Snare, MD  meloxicam (MOBIC) 7.5 MG tablet Take 1 tablet (7.5 mg total) by mouth daily. 07/07/19  Yes [name], MD  metFORMIN (GLUCOPHAGE) 500 MG tablet TAKE 1 TABLET BY MOUTH TWICE DAILY WITH A MEAL. 06/20/19  Yes [name], MD  Multiple  Vitamins-Minerals (MULTIVITAMIN WITH MINERALS) tablet Take 1 tablet by mouth daily.   Yes [provider]  nitroGLYCERIN (NITROSTAT) 0.4 MG SL tablet Place 1 tablet (0.4 mg total) under the tongue every 5 (five) minutes x 3 doses as needed for chest pain. 08/04/17  Yes Luking, Elayne Snare, MD  NOVOLOG FLEXPEN 100 UNIT/ML FlexPen INJECT 25 UNITS INTO THE  SKIN 3 TIMES DAILY 06/29/19  Yes Luking, Scott A, MD  pravastatin (PRAVACHOL) 80 MG tablet TAKE 1 TABLET BY MOUTH AT BEDTIME FOR CHOLESTEROL 06/20/19  Yes Luking, Scott A, MD  Insulin Pen Needle (B-D ULTRAFINE III SHORT PEN) 31G X 8 MM MISC USE AS DIRECTED. Patient not taking: Reported on 08/10/2019 06/20/19   [name], MD  polyethylene glycol (MIRALAX / GLYCOLAX) packet Take 17 g by mouth daily as needed for mild constipation. Patient not taking: Reported on 04/11/2019 04/03/16   [name], MD    Review of Systems:  Constitutional:  No weight loss, night sweats, Fevers, chills, fatigue.  Head&Eyes: No headache.  No  vision loss.  No eye pain or scotoma ENT:  No Difficulty swallowing,Tooth/dental problems,Sore throat,  No ear ache, post nasal drip,  Cardio-vascular:  No  Orthopnea, PND, swelling in lower extremities,  dizziness, palpitations  GI:  No  abdominal pain, nausea, vomiting, diarrhea, loss of appetite, hematochezia, melena, heartburn, indigestion, Resp:  No shortness of breath with exertion or at rest. No cough. No coughing up of blood .No wheezing.No chest wall deformity  Skin:  no rash or lesions.  GU:  no dysuria, change in color of urine, no urgency or frequency. No flank pain.  Musculoskeletal:  No joint pain or swelling. No decreased range of motion. No back pain.  Psych:  No change in mood or affect. No depression or anxiety. Neurologic: No headache, no dysesthesia, no focal weakness, no vision loss. No syncope  Physical Exam: Vitals:   08/10/19 1215 08/10/19 1230 08/10/19 1300 08/10/19 1330  BP:  (!) 181/96 (!) 179/92 (!) 176/90  Pulse: 72 73 70   Resp: 18 (!) 29 (!) 28 (!) 24  SpO2: 94% 94% 92%   Weight:      Height:       General:  A&O x 3, NAD, nontoxic, pleasant/cooperative Head/Eye: No conjunctival hemorrhage, no icterus, [place]/AT, No nystagmus ENT:  No icterus,  No thrush, good dentition, no pharyngeal exudate Neck:  No masses, no lymphadenpathy, no bruits CV:  RRR, no rub, no gallop, no S3 Lung:  Bibasilar crackles. No wheeze Abdomen: soft/NT, +BS, nondistended, no peritoneal signs Ext: No cyanosis, No rashes, No petechiae, No lymphangitis, No edema Neuro: CNII-XII intact, strength 4/5 in bilateral upper and lower extremities, no dysmetria  Labs on Admission:  Basic Metabolic Panel: Recent Labs  Lab 08/10/19 1108  NA 141  K 3.7  CL 104  CO2 26  GLUCOSE 223*  BUN 16  CREATININE 0.90  CALCIUM 9.1   Liver Function Tests: Recent Labs  Lab 08/10/19 1108  AST 21  ALT 17  ALKPHOS 55  BILITOT 0.5  PROT 7.7  ALBUMIN 3.9   No results for input(s):  LIPASE, AMYLASE in the last 168 hours. No results for input(s): AMMONIA in the last 168 hours. CBC: Recent Labs  Lab 08/10/19 1108  WBC 11.4*  NEUTROABS 7.8*  HGB 13.5  HCT 44.4  MCV 91.5  PLT 254   Coagulation Profile: No results for input(s): INR, PROTIME in the last 168 hours. Cardiac Enzymes: No results  for input(s): CKTOTAL, CKMB, CKMBINDEX, TROPONINI in the last 168 hours. BNP: Invalid input(s): POCBNP CBG: No results for input(s): GLUCAP in the last 168 hours. Urine analysis:    Component Value Date/Time   COLORURINE YELLOW 07/24/2017 1948   APPEARANCEUR CLEAR 07/24/2017 1948   LABSPEC 1.023 07/24/2017 1948   PHURINE 5.0 07/24/2017 1948   GLUCOSEU >=500 (A) 07/24/2017 1948   HGBUR NEGATIVE 07/24/2017 1948   BILIRUBINUR NEGATIVE 07/24/2017 1948   KETONESUR 5 (A) 07/24/2017 1948   PROTEINUR 100 (A) 07/24/2017 1948   UROBILINOGEN 0.2 10/28/2008 0024   NITRITE NEGATIVE 07/24/2017 1948   LEUKOCYTESUR NEGATIVE 07/24/2017 1948   Sepsis Labs: @LABRCNTIP (procalcitonin:4,lacticidven:4) )No results found for this or any previous visit (from the past 240 hour(s)).   Radiological Exams on Admission: DG Chest Portable 1 View  Result Date: 08/10/2019 CLINICAL DATA:  Left lower chest pain beginning this morning. EXAM: PORTABLE CHEST 1 VIEW COMPARISON:  07/24/2017 FINDINGS: Cardiac silhouette is normal in size. No mediastinal or hilar masses. No evidence of adenopathy. Clear lungs.  No pleural effusion or pneumothorax. Skeletal structures are grossly intact. IMPRESSION: No active disease. Electronically Signed   By: Lajean Manes M.D.   On: 08/10/2019 10:34    EKG: Independently reviewed. Sinus, RBBB    Time spent:60 minutes Code Status:   FULL Family Communication:  No Family at bedside Disposition Plan: expect 1 day hospitalization Consults called: cardiology  DVT Prophylaxis: [place] Lovenox  Orson Eva, DO  [facility] Pager [phone]  If 7PM-7AM, please  contact night-coverage [email or web address] Password [facility] 08/10/2019, 2:24 PM

## 2019-08-10 NOTE — Progress Notes (Signed)
*  PRELIMINARY RESULTS* Echocardiogram 2D Echocardiogram has been performed.  Francisco Powell 08/10/2019, 5:04 PM

## 2019-08-10 NOTE — ED Triage Notes (Signed)
CP in lower LT chest that started this morning around 0900. Given 1 nitro and 4 baby asa.  Pt denies CP at this time.

## 2019-08-11 ENCOUNTER — Other Ambulatory Visit (HOSPITAL_COMMUNITY): Payer: Medicare Other

## 2019-08-11 DIAGNOSIS — E785 Hyperlipidemia, unspecified: Secondary | ICD-10-CM | POA: Diagnosis not present

## 2019-08-11 DIAGNOSIS — I10 Essential (primary) hypertension: Secondary | ICD-10-CM

## 2019-08-11 DIAGNOSIS — I2510 Atherosclerotic heart disease of native coronary artery without angina pectoris: Secondary | ICD-10-CM

## 2019-08-11 DIAGNOSIS — E782 Mixed hyperlipidemia: Secondary | ICD-10-CM | POA: Diagnosis not present

## 2019-08-11 DIAGNOSIS — I499 Cardiac arrhythmia, unspecified: Secondary | ICD-10-CM | POA: Diagnosis not present

## 2019-08-11 DIAGNOSIS — F325 Major depressive disorder, single episode, in full remission: Secondary | ICD-10-CM | POA: Diagnosis not present

## 2019-08-11 DIAGNOSIS — R079 Chest pain, unspecified: Secondary | ICD-10-CM | POA: Diagnosis not present

## 2019-08-11 DIAGNOSIS — I25118 Atherosclerotic heart disease of native coronary artery with other forms of angina pectoris: Secondary | ICD-10-CM

## 2019-08-11 DIAGNOSIS — I452 Bifascicular block: Secondary | ICD-10-CM | POA: Diagnosis not present

## 2019-08-11 LAB — BASIC METABOLIC PANEL
Anion gap: 15 (ref 5–15)
BUN: 18 mg/dL (ref 8–23)
CO2: 29 mmol/L (ref 22–32)
Calcium: 9.5 mg/dL (ref 8.9–10.3)
Chloride: 101 mmol/L (ref 98–111)
Creatinine, Ser: 0.94 mg/dL (ref 0.61–1.24)
GFR calc Af Amer: >60 mL/min (ref >60)
GFR calc non Af Amer: >60 mL/min (ref >60)
Glucose, Bld: 225 mg/dL — ABNORMAL HIGH (ref 70–99)
Potassium: 3.9 mmol/L (ref 3.5–5.1)
Sodium: 145 mmol/L (ref 135–145)

## 2019-08-11 LAB — CBC
HCT: 46.8 % (ref 39.0–52.0)
Hemoglobin: 14.1 g/dL (ref 13.0–17.0)
MCH: 27.8 pg (ref 26.0–34.0)
MCHC: 30.1 g/dL (ref 30.0–36.0)
MCV: 92.3 fL (ref 80.0–100.0)
Platelets: 276 10*3/uL (ref 150–400)
RBC: 5.07 MIL/uL (ref 4.22–5.81)
RDW: 17.2 % — ABNORMAL HIGH (ref 11.5–15.5)
WBC: 10.7 10*3/uL — ABNORMAL HIGH (ref 4.0–10.5)
nRBC: 0 % (ref 0.0–0.2)

## 2019-08-11 LAB — LIPID PANEL
Cholesterol: 154 mg/dL (ref 0–200)
HDL: 37 mg/dL — ABNORMAL LOW (ref >40)
LDL Cholesterol: 47 mg/dL (ref 0–99)
Total CHOL/HDL Ratio: 4.2 RATIO
Triglycerides: 349 mg/dL — ABNORMAL HIGH (ref <150)
VLDL: 70 mg/dL — ABNORMAL HIGH (ref 0–40)

## 2019-08-11 LAB — HEMOGLOBIN A1C
Hgb A1c MFr Bld: 8.2 % — ABNORMAL HIGH (ref 4.8–5.6)
Mean Plasma Glucose: 188.64 mg/dL

## 2019-08-11 LAB — GLUCOSE, CAPILLARY
Glucose-Capillary: 246 mg/dL — ABNORMAL HIGH (ref 70–99)
Glucose-Capillary: 263 mg/dL — ABNORMAL HIGH (ref 70–99)

## 2019-08-11 MED ORDER — INSULIN ASPART 100 UNIT/ML ~~LOC~~ SOLN
0.0000 [IU] | Freq: Three times a day (TID) | SUBCUTANEOUS | Status: DC
  Administered 2019-08-11: 8 [IU] via SUBCUTANEOUS

## 2019-08-11 MED ORDER — INSULIN ASPART 100 UNIT/ML ~~LOC~~ SOLN: 0.0000 [IU] | Freq: Every day | SUBCUTANEOUS | Status: DC

## 2019-08-11 MED ORDER — SPIRONOLACTONE 25 MG PO TABS: 25.0000 mg | ORAL_TABLET | Freq: Every day | ORAL | 1 refills | Status: DC

## 2019-08-11 MED ORDER — SPIRONOLACTONE 25 MG PO TABS
25.0000 mg | ORAL_TABLET | Freq: Every day | ORAL | Status: DC
  Administered 2019-08-11: 25 mg via ORAL
  Filled 2019-08-11: qty 1

## 2019-08-11 NOTE — Evaluation (Signed)
Physical Therapy Evaluation Patient Details Name: Francisco Powell MRN: [id number] DOB: 03/14/1941 Today's Date: 08/11/2019   History of Present Illness  Francisco Powell is a 79 y.o. male with medical history of coronary artery disease, hypertension, hyperlipidemia, diabetes mellitus type 2, depression, iron deficiency presenting with left-sided chest pain that began in the morning of 08/10/2019 while he was lying in bed.  The patient stated that it lasted 3 to 4 minutes.  He denied any associated symptoms including dizziness, diaphoresis, shortness of breath, nausea, vomiting.  He took 1 sublingual nitroglycerin with improvement of his chest discomfort.  However, he states that at the same time he had a belching spell which also relieved his chest discomfort.  The patient denies any recurrent chest discomfort prior to the episode described above.  He is compliant with all his medications.  At baseline, the patient has poor functional status and uses a walker and wheelchair.  He denies any recent fevers, chills, headache, shortness breath, coughing, hemoptysis, nausea, vomiting, diarrhea, abdominal pain, dysuria, hematuria.    Clinical Impression  Patient limited for functional mobility as stated below secondary to BLE weakness, fatigue and poor seated balance. Patient requires mod/max assist to transition to seated EOB. He requires mod/max assist to remain seated due to impaired balance and trunk control/weakness. He states normally he is able to ambulate around his house with RW but he was unable to transfer to standing or ambulate today. Patient able to return home if daughter and aids are able to provide assist level needed but may require rehab in skilled nursing to return to PLOF to return home.  Patient will benefit from continued physical therapy in hospital and recommended venue below to increase strength, balance, endurance for safe ADLs and gait.     Follow Up Recommendations  Supervision/Assistance - 24 hour;Supervision for mobility/OOB;SNF    Equipment Recommendations  None recommended by PT    Recommendations for Other Services       Precautions / Restrictions Precautions Precautions: Fall Restrictions Weight Bearing Restrictions: No      Mobility  Bed Mobility Overal bed mobility: Needs Assistance Bed Mobility: Supine to Sit;Sit to Supine     Supine to sit: Mod assist;Max assist Sit to supine: Mod assist   General bed mobility comments: to transition to seated EOB, requires support to remain seated due to inability to support himself secondary to weakness  Transfers                    Ambulation/Gait                Stairs            Wheelchair Mobility    Modified Rankin (Stroke Patients Only)       Balance Overall balance assessment: Needs assistance Sitting-balance support: Bilateral upper extremity supported Sitting balance-Leahy Scale: Zero Sitting balance - Comments: poor/zero seated EOB                                     Pertinent Vitals/Pain Pain Assessment: Faces Faces Pain Scale: Hurts little more Pain Location: back Pain Intervention(s): Limited activity within patient's tolerance;Monitored during session    Home Living Family/patient expects to be discharged to:: Private residence Living Arrangements: Children Available Help at Discharge: Family;Personal care attendant;Available PRN/intermittently Type of Home: House         Home Equipment: Walker - 2 wheels;Bedside  commode;Cane - single point;Hospital bed;Wheelchair - manual      Prior Function Level of Independence: Needs assistance   Gait / Transfers Assistance Needed: Patient states he is able to ambulate in his house with RW, wheel chair  ADL's / Homemaking Assistance Needed: Patient states daughter is able to assist, has aid assist him with all ADL from 7am-12pm, 3-730pm        Hand Dominance         Extremity/Trunk Assessment   Upper Extremity Assessment Upper Extremity Assessment: Generalized weakness    Lower Extremity Assessment Lower Extremity Assessment: Generalized weakness    Cervical / Trunk Assessment Cervical / Trunk Assessment: Normal  Communication   Communication: Expressive difficulties  Cognition Arousal/Alertness: Awake/alert Behavior During Therapy: WFL for tasks assessed/performed;Flat affect Overall Cognitive Status: No family/caregiver present to determine baseline cognitive functioning                                        General Comments      Exercises     Assessment/Plan    PT Assessment Patient needs continued PT services  PT Problem List Decreased strength;Decreased mobility;Decreased range of motion;Decreased activity tolerance;Decreased balance;Decreased knowledge of use of DME;Obesity;Pain       PT Treatment Interventions DME instruction;Therapeutic exercise;Gait training;Wheelchair mobility training;Balance training;Stair training;Neuromuscular re-education;Functional mobility training;Patient/family education;Therapeutic activities    PT Goals (Current goals can be found in the Care Plan section)  Acute Rehab PT Goals Patient Stated Goal: return home PT Goal Formulation: With patient Time For Goal Achievement: 08/18/19 Potential to Achieve Goals: Fair    Frequency Min 3X/week   Barriers to discharge        Co-evaluation               AM-PAC PT "6 Clicks" Mobility  Outcome Measure Help needed turning from your back to your side while in a flat bed without using bedrails?: A Lot Help needed moving from lying on your back to sitting on the side of a flat bed without using bedrails?: A Lot Help needed moving to and from a bed to a chair (including a wheelchair)?: Total Help needed standing up from a chair using your arms (e.g., wheelchair or bedside chair)?: Total Help needed to walk in hospital room?:  Total Help needed climbing 3-5 steps with a railing? : Total 6 Click Score: 8    End of Session   Activity Tolerance: Patient limited by fatigue Patient left: in bed;with call bell/phone within reach Nurse Communication: Mobility status PT Visit Diagnosis: Unsteadiness on feet (R26.81);Other abnormalities of gait and mobility (R26.89);Muscle weakness (generalized) (M62.81)    Time: [id number] PT Time Calculation (min) (ACUTE ONLY): 24 min   Charges:   PT Evaluation $PT Eval Low Complexity: 1 Low PT Treatments $Therapeutic Activity: 8-22 mins        12:24 PM, 08/11/19 Mearl Latin PT, DPT Physical Therapist at [facility]

## 2019-08-11 NOTE — Plan of Care (Signed)
  Problem: Acute Rehab PT Goals(only PT should resolve) Goal: Pt Will Go Supine/Side To Sit Outcome: Progressing Flowsheets (Taken 08/11/2019 1226) Pt will go Supine/Side to Sit:  with minimal assist  with moderate assist Goal: Patient Will Perform Sitting Balance Outcome: Progressing Flowsheets (Taken 08/11/2019 1226) Patient will perform sitting balance: with minimal assist Goal: Patient Will Transfer Sit To/From Stand Outcome: Progressing Flowsheets (Taken 08/11/2019 1226) Patient will transfer sit to/from stand: with moderate assist  12:27 PM, 08/11/19 Mearl Latin PT, DPT Physical Therapist at [facility]

## 2019-08-11 NOTE — Progress Notes (Signed)
Patient complained of nausea. zofran ordered prn q6h and was given at 2230. MD contacted. Informed to give another dose of zofran.

## 2019-08-11 NOTE — Discharge Summary (Signed)
Physician Discharge Summary  Francisco Powell [id number] DOB: [birth year] DOA: 08/10/2019  PCP: [name], MD  Admit date: 08/10/2019 Discharge date: 08/11/2019  Admitted From: Home Disposition:  Home   Recommendations for Outpatient Follow-up:  1. Follow up with PCP in 1-2 weeks 2. Please obtain BMP/CBC in one week   Home Health:YES Equipment/Devices: HHPT  Discharge Condition: Stable CODE STATUS: FULL Diet recommendation: Heart Healthy / Carb Modified    Brief/Interim Summary:  79 y.o. male with medical history of coronary artery disease, hypertension, hyperlipidemia, diabetes mellitus type 2, depression, iron deficiency presenting with left-sided chest pain that began in the morning of 08/10/2019 while he was lying in bed.  The patient stated that it lasted 3 to 4 minutes.  He denied any associated symptoms including dizziness, diaphoresis, shortness of breath, nausea, vomiting.  He took 1 sublingual nitroglycerin with improvement of his chest discomfort.  However, he states that at the same time he had a belching spell which also relieved his chest discomfort.  The patient denies any recurrent chest discomfort prior to the episode described above.  He is compliant with all his medications.  At baseline, the patient has poor functional status and uses a walker and wheelchair.  He denies any recent fevers, chills, headache, shortness breath, coughing, hemoptysis, nausea, vomiting, diarrhea, abdominal pain, dysuria, hematuria. In the emergency department, the patient was afebrile hemodynamically stable with oxygen saturation 94% on room air  Discharge Diagnoses:  Chest pain -Seems atypical by clinical history -May been also incited by the patient's uncontrolled blood pressure -Troponin 10>>> 11>>12>>13 -EKG with unchanged right bundle branch block -Echocardiogram--EF 60-65%, no WMA -Cardiology consult appreciated-->outpt stress test -Continue aspirin  Essential  hypertension -Continue amlodipine, lisinopril, carvedilol -Hydralazine as needed > SBP>180 -add spironolactone  Diabetes mellitus type 2, uncontrolled with hyperglycemia -03/23/2019 hemoglobin A1c 6.9 -08/10/19 hemoglobin A1c--8.2 -Reduced dose Lantus -NovoLog sliding scale -Holding metformin--restart after d/c  Hyperlipidemia -Continue statin -Check lipid panel in a.m.-->LDL 47  Coronary artery disease -History of DES to the RCA and LAD 2015 -No chest pain present -continue aspirin, carvedilol, statin  Depression/anxiety -Continue Cymbalta and a home dose alprazolam     Discharge Instructions   Allergies as of 08/11/2019      Reactions   Penicillins Hives, Itching, Swelling   Has patient had a PCN reaction causing immediate rash, facial/tongue/throat swelling, SOB or lightheadedness with hypotension: Unknown Has patient had a PCN reaction causing severe rash involving mucus membranes or skin necrosis: Unknown Has patient had a PCN reaction that required hospitalization: Unknown Has patient had a PCN reaction occurring within the last 10 years: Unknown If all of the above answers are "NO", then may proceed with Cephalosporin use.   Other    Bee stings   Atorvastatin Other (See Comments)   Muscle pain/cramps   Celexa [citalopram] Diarrhea      Medication List    STOP taking these medications   B-D ULTRAFINE III SHORT PEN 31G X 8 MM Misc Generic drug: Insulin Pen Needle   polyethylene glycol 17 g packet Commonly known as: MIRALAX / GLYCOLAX     TAKE these medications   ALPRAZolam 1 MG tablet Commonly known as: XANAX Take 0.5-1 tablets (0.5-1 mg total) by mouth at bedtime as needed. for sleep   amLODipine 10 MG tablet Commonly known as: NORVASC TAKE (1) TABLET BY MOUTH ONCE DAILY.   Aspirin 81 81 MG chewable tablet Generic drug: aspirin Chew 81 mg by mouth daily.   carvedilol  25 MG tablet Commonly known as: COREG TAKE (1) TABLET BY MOUTH TWICE  DAILY.   diphenhydramine-acetaminophen 25-500 MG Tabs tablet Commonly known as: TYLENOL PM Take 1 tablet by mouth at bedtime as needed.   DULoxetine 60 MG capsule Commonly known as: CYMBALTA Take 1 capsule (60 mg total) by mouth daily.   gabapentin 300 MG capsule Commonly known as: NEURONTIN Take one capsule po twice daily. CAUTION:DROWSINESS   insulin detemir 100 UNIT/ML injection Commonly known as: Levemir INJECT SUBCUTANEOUSLY 54  UNITS IN THE MORNING AND 62 UNITS IN THE EVENING   insulin lispro 100 UNIT/ML injection Commonly known as: HumaLOG Inject 25 units into skin 3 times per day   lidocaine 2 % jelly Commonly known as: XYLOCAINE Apply 1 application topically as needed.   lisinopril 40 MG tablet Commonly known as: ZESTRIL Take 1 tablet (40 mg total) by mouth daily.   Melatonin 10 MG Tabs Take 10 mg by mouth at bedtime.   meloxicam 7.5 MG tablet Commonly known as: MOBIC Take 1 tablet (7.5 mg total) by mouth daily.   metFORMIN 500 MG tablet Commonly known as: GLUCOPHAGE TAKE 1 TABLET BY MOUTH TWICE DAILY WITH A MEAL.   multivitamin with minerals tablet Take 1 tablet by mouth daily.   nitroGLYCERIN 0.4 MG SL tablet Commonly known as: NITROSTAT Place 1 tablet (0.4 mg total) under the tongue every 5 (five) minutes x 3 doses as needed for chest pain.   NovoLOG FlexPen 100 UNIT/ML FlexPen Generic drug: insulin aspart INJECT 25 UNITS INTO THE  SKIN 3 TIMES DAILY   pravastatin 80 MG tablet Commonly known as: PRAVACHOL TAKE 1 TABLET BY MOUTH AT BEDTIME FOR CHOLESTEROL   spironolactone 25 MG tablet Commonly known as: ALDACTONE Take 1 tablet (25 mg total) by mouth daily. Start taking on: August 12, 2019       Allergies  Allergen Reactions  . Penicillins Hives, Itching and Swelling    Has patient had a PCN reaction causing immediate rash, facial/tongue/throat swelling, SOB or lightheadedness with hypotension: Unknown Has patient had a PCN reaction  causing severe rash involving mucus membranes or skin necrosis: Unknown Has patient had a PCN reaction that required hospitalization: Unknown Has patient had a PCN reaction occurring within the last 10 years: Unknown If all of the above answers are "NO", then may proceed with Cephalosporin use.   . Other     Bee stings  . Atorvastatin Other (See Comments)    Muscle pain/cramps   . Celexa [Citalopram] Diarrhea    Consultations:  cardiology   Procedures/Studies: DG Chest Portable 1 View  Result Date: 08/10/2019 CLINICAL DATA:  Left lower chest pain beginning this morning. EXAM: PORTABLE CHEST 1 VIEW COMPARISON:  07/24/2017 FINDINGS: Cardiac silhouette is normal in size. No mediastinal or hilar masses. No evidence of adenopathy. Clear lungs.  No pleural effusion or pneumothorax. Skeletal structures are grossly intact. IMPRESSION: No active disease. Electronically Signed   By: Lajean Manes M.D.   On: 08/10/2019 10:34   ECHOCARDIOGRAM COMPLETE  Result Date: 08/10/2019    ECHOCARDIOGRAM REPORT   Patient Name:   Francisco Powell Date of Exam: 08/10/2019 Medical Rec #:  [id number]        Height:       74.0 in Accession #:    [id number]       Weight:       276.0 lb Date of Birth:  Sep 25, 1940        BSA:  2.492 m Patient Age:    79 years         BP:           188/84 mmHg Patient Gender: M                HR:           80 bpm. Exam Location:  [facility] Procedure: 2D Echo, Cardiac Doppler and Color Doppler Indications:    Chest Pain 786.50 / R07.9  History:        Patient has prior history of Echocardiogram examinations, most                 recent 09/09/2011. Previous Myocardial Infarction; Risk                 Factors:Hypertension, Diabetes and Dyslipidemia. Bifascicular                 block - RBBB, LAFB,Morbid obesity,S/P PTCA (percutaneous                 transluminal coronary angioplasty) mLAD with DES 11/11/13; DES to                 [facility] 11/09/13.  Sonographer:    Alvino Chapel RCS Referring  Phys: [id number] [place]  1. Left ventricular ejection fraction, by estimation, is 60 to 65%. The left ventricle has normal function. The left ventricle has no regional wall motion abnormalities. There is moderate left ventricular hypertrophy. Left ventricular diastolic parameters are consistent with Grade I diastolic dysfunction (impaired relaxation).  2. Right ventricular systolic function is normal. The right ventricular size is normal.  3. The mitral valve is normal in structure and function. No evidence of mitral valve regurgitation. No evidence of mitral stenosis.  4. The aortic valve is tricuspid. Aortic valve regurgitation is not visualized. No aortic stenosis is present. FINDINGS  Left Ventricle: Left ventricular ejection fraction, by estimation, is 60 to 65%. The left ventricle has normal function. The left ventricle has no regional wall motion abnormalities. The left ventricular internal cavity size was normal in size. There is  moderate left ventricular hypertrophy. Left ventricular diastolic parameters are consistent with Grade I diastolic dysfunction (impaired relaxation). Normal left ventricular filling pressure. Right Ventricle: The right ventricular size is normal. No increase in right ventricular wall thickness. Right ventricular systolic function is normal. Left Atrium: Left atrial size was normal in size. Right Atrium: Right atrial size was normal in size. Pericardium: There is no evidence of pericardial effusion. Mitral Valve: The mitral valve is normal in structure and function. No evidence of mitral valve regurgitation. No evidence of mitral valve stenosis. Tricuspid Valve: The tricuspid valve is normal in structure. Tricuspid valve regurgitation is not demonstrated. No evidence of tricuspid stenosis. Aortic Valve: The aortic valve is tricuspid. Aortic valve regurgitation is not visualized. No aortic stenosis is present. Aortic valve mean gradient measures 4.0 mmHg. Aortic  valve peak gradient measures 8.4 mmHg. Aortic valve area, by VTI measures 2.03 cm. Pulmonic Valve: The pulmonic valve was not well visualized. Pulmonic valve regurgitation is not visualized. No evidence of pulmonic stenosis. Aorta: The aortic root is normal in size and structure. Pulmonary Artery: Indeterminant PASP, inadequate TR Jet. Venous: The inferior vena cava was not well visualized. IAS/Shunts: No atrial level shunt detected by color flow Doppler.  LEFT VENTRICLE PLAX 2D LVIDd:         5.34 cm  Diastology LVIDs:  3.10 cm  LV e' lateral:   5.11 cm/s LV PW:         1.57 cm  LV E/e' lateral: 12.0 LV IVS:        1.39 cm  LV e' medial:    5.22 cm/s LVOT diam:     2.00 cm  LV E/e' medial:  11.7 LV SV:         57 LV SV Index:   23 LVOT Area:     3.14 cm  RIGHT VENTRICLE RV S prime:     15.60 cm/s TAPSE (M-mode): 2.8 cm LEFT ATRIUM             Index LA diam:        3.50 cm 1.40 cm/m LA Vol (A2C):   62.5 ml 25.08 ml/m LA Vol (A4C):   56.9 ml 22.83 ml/m LA Biplane Vol: 61.2 ml 24.56 ml/m  AORTIC VALVE AV Area (Vmax):    2.00 cm AV Area (Vmean):   2.29 cm AV Area (VTI):     2.03 cm AV Vmax:           145.00 cm/s AV Vmean:          93.500 cm/s AV VTI:            0.282 m AV Peak Grad:      8.4 mmHg AV Mean Grad:      4.0 mmHg LVOT Vmax:         92.50 cm/s LVOT Vmean:        68.300 cm/s LVOT VTI:          0.182 m LVOT/AV VTI ratio: 0.65  AORTA Ao Root diam: 3.90 cm MITRAL VALVE MV Area (PHT): 1.70 cm     SHUNTS MV Decel Time: 447 msec     Systemic VTI:  0.18 m MV E velocity: 61.30 cm/s   Systemic Diam: 2.00 cm MV A velocity: 113.00 cm/s MV E/A ratio:  0.54 Carlyle Dolly MD Electronically signed by Carlyle Dolly MD Signature Date/Time: 08/10/2019/5:12:26 PM    Final          Discharge Exam: Vitals:   08/10/19 2047 08/11/19 0459  BP: (!) 184/91 (!) 178/90  Pulse: 65 76  Resp: 20 20  Temp: 98.8 F (37.1 C) 97.8 F (36.6 C)  SpO2: 93% 91%   Vitals:   08/10/19 1817 08/10/19 1950 08/10/19  2047 08/11/19 0459  BP: (!) 147/100  (!) 184/91 (!) 178/90  Pulse: 83  65 76  Resp:   20 20  Temp:   98.8 F (37.1 C) 97.8 F (36.6 C)  TempSrc:   Oral   SpO2: 95%  93% 91%  Weight:  125.2 kg    Height:  6\' 2"  (1.88 m)      General: Pt is alert, awake, not in acute distress Cardiovascular: RRR, S1/S2 +, no rubs, no gallops Respiratory: bibasilar crackles, no wheeze Abdominal: Soft, NT, ND, bowel sounds + Extremities: no edema, no cyanosis   The results of significant diagnostics from this hospitalization (including imaging, microbiology, ancillary and laboratory) are listed below for reference.    Significant Diagnostic Studies: DG Chest Portable 1 View  Result Date: 08/10/2019 CLINICAL DATA:  Left lower chest pain beginning this morning. EXAM: PORTABLE CHEST 1 VIEW COMPARISON:  07/24/2017 FINDINGS: Cardiac silhouette is normal in size. No mediastinal or hilar masses. No evidence of adenopathy. Clear lungs.  No pleural effusion or pneumothorax. Skeletal structures are grossly intact. IMPRESSION: No active disease.  Electronically Signed   By: Lajean Manes M.D.   On: 08/10/2019 10:34   ECHOCARDIOGRAM COMPLETE  Result Date: 08/10/2019    ECHOCARDIOGRAM REPORT   Patient Name:   Francisco Powell Date of Exam: 08/10/2019 Medical Rec #:  [id number]        Height:       74.0 in Accession #:    [id number]       Weight:       276.0 lb Date of Birth:  12-21-1940        BSA:          2.492 m Patient Age:    79 years         BP:           188/84 mmHg Patient Gender: M                HR:           80 bpm. Exam Location:  [facility] Procedure: 2D Echo, Cardiac Doppler and Color Doppler Indications:    Chest Pain 786.50 / R07.9  History:        Patient has prior history of Echocardiogram examinations, most                 recent 09/09/2011. Previous Myocardial Infarction; Risk                 Factors:Hypertension, Diabetes and Dyslipidemia. Bifascicular                 block - RBBB, LAFB,Morbid  obesity,S/P PTCA (percutaneous                 transluminal coronary angioplasty) mLAD with DES 11/11/13; DES to                 [facility] 11/09/13.  Sonographer:    Alvino Chapel RCS Referring Phys: [id number] [place]  1. Left ventricular ejection fraction, by estimation, is 60 to 65%. The left ventricle has normal function. The left ventricle has no regional wall motion abnormalities. There is moderate left ventricular hypertrophy. Left ventricular diastolic parameters are consistent with Grade I diastolic dysfunction (impaired relaxation).  2. Right ventricular systolic function is normal. The right ventricular size is normal.  3. The mitral valve is normal in structure and function. No evidence of mitral valve regurgitation. No evidence of mitral stenosis.  4. The aortic valve is tricuspid. Aortic valve regurgitation is not visualized. No aortic stenosis is present. FINDINGS  Left Ventricle: Left ventricular ejection fraction, by estimation, is 60 to 65%. The left ventricle has normal function. The left ventricle has no regional wall motion abnormalities. The left ventricular internal cavity size was normal in size. There is  moderate left ventricular hypertrophy. Left ventricular diastolic parameters are consistent with Grade I diastolic dysfunction (impaired relaxation). Normal left ventricular filling pressure. Right Ventricle: The right ventricular size is normal. No increase in right ventricular wall thickness. Right ventricular systolic function is normal. Left Atrium: Left atrial size was normal in size. Right Atrium: Right atrial size was normal in size. Pericardium: There is no evidence of pericardial effusion. Mitral Valve: The mitral valve is normal in structure and function. No evidence of mitral valve regurgitation. No evidence of mitral valve stenosis. Tricuspid Valve: The tricuspid valve is normal in structure. Tricuspid valve regurgitation is not demonstrated. No evidence of tricuspid  stenosis. Aortic Valve: The aortic valve is tricuspid. Aortic valve regurgitation is not visualized. No aortic stenosis  is present. Aortic valve mean gradient measures 4.0 mmHg. Aortic valve peak gradient measures 8.4 mmHg. Aortic valve area, by VTI measures 2.03 cm. Pulmonic Valve: The pulmonic valve was not well visualized. Pulmonic valve regurgitation is not visualized. No evidence of pulmonic stenosis. Aorta: The aortic root is normal in size and structure. Pulmonary Artery: Indeterminant PASP, inadequate TR Jet. Venous: The inferior vena cava was not well visualized. IAS/Shunts: No atrial level shunt detected by color flow Doppler.  LEFT VENTRICLE PLAX 2D LVIDd:         5.34 cm  Diastology LVIDs:         3.10 cm  LV e' lateral:   5.11 cm/s LV PW:         1.57 cm  LV E/e' lateral: 12.0 LV IVS:        1.39 cm  LV e' medial:    5.22 cm/s LVOT diam:     2.00 cm  LV E/e' medial:  11.7 LV SV:         57 LV SV Index:   23 LVOT Area:     3.14 cm  RIGHT VENTRICLE RV S prime:     15.60 cm/s TAPSE (M-mode): 2.8 cm LEFT ATRIUM             Index LA diam:        3.50 cm 1.40 cm/m LA Vol (A2C):   62.5 ml 25.08 ml/m LA Vol (A4C):   56.9 ml 22.83 ml/m LA Biplane Vol: 61.2 ml 24.56 ml/m  AORTIC VALVE AV Area (Vmax):    2.00 cm AV Area (Vmean):   2.29 cm AV Area (VTI):     2.03 cm AV Vmax:           145.00 cm/s AV Vmean:          93.500 cm/s AV VTI:            0.282 m AV Peak Grad:      8.4 mmHg AV Mean Grad:      4.0 mmHg LVOT Vmax:         92.50 cm/s LVOT Vmean:        68.300 cm/s LVOT VTI:          0.182 m LVOT/AV VTI ratio: 0.65  AORTA Ao Root diam: 3.90 cm MITRAL VALVE MV Area (PHT): 1.70 cm     SHUNTS MV Decel Time: 447 msec     Systemic VTI:  0.18 m MV E velocity: 61.30 cm/s   Systemic Diam: 2.00 cm MV A velocity: 113.00 cm/s MV E/A ratio:  0.54 Carlyle Dolly MD Electronically signed by Carlyle Dolly MD Signature Date/Time: 08/10/2019/5:12:26 PM    Final      Microbiology: Recent Results (from the past  240 hour(s))  SARS CORONAVIRUS 2 (Mahlia Fernando 6-24 HRS) Nasopharyngeal Nasopharyngeal Swab     Status: None   Collection Time: 08/10/19  1:47 PM   Specimen: Nasopharyngeal Swab  Result Value Ref Range Status   SARS Coronavirus 2 NEGATIVE NEGATIVE Final    Comment: (NOTE) SARS-CoV-2 target nucleic acids are NOT DETECTED. The SARS-CoV-2 RNA is generally detectable in upper and lower respiratory specimens during the acute phase of infection. Negative results do not preclude SARS-CoV-2 infection, do not rule out co-infections with other pathogens, and should not be used as the sole basis for treatment or other patient management decisions. Negative results must be combined with clinical observations, patient history, and epidemiological information. The expected result is Negative. Fact Sheet  for Patients: SugarRoll.be Fact Sheet for Healthcare Providers: https://www.woods-mathews.com/ This test is not yet approved or cleared by the [place] FDA and  has been authorized for detection and/or diagnosis of SARS-CoV-2 by FDA under an Emergency Use Authorization (EUA). This EUA will remain  in effect (meaning this test can be used) for the duration of the COVID-19 declaration under Section 56 4(b)(1) of the Act, 21 U.S.C. section 360bbb-3(b)(1), unless the authorization is terminated or revoked sooner. Performed at [facility], [place] [address] [place], [place] [address]      Labs: Basic Metabolic Panel: Recent Labs  Lab 08/10/19 1108 08/11/19 0408  NA 141 145  K 3.7 3.9  CL 104 101  CO2 26 29  GLUCOSE 223* 225*  BUN 16 18  CREATININE 0.90 0.94  CALCIUM 9.1 9.5   Liver Function Tests: Recent Labs  Lab 08/10/19 1108  AST 21  ALT 17  ALKPHOS 55  BILITOT 0.5  PROT 7.7  ALBUMIN 3.9   No results for input(s): LIPASE, AMYLASE in the last 168 hours. No results for input(s): AMMONIA in the last 168 hours. CBC: Recent Labs  Lab  08/10/19 1108 08/11/19 0408  WBC 11.4* 10.7*  NEUTROABS 7.8*  --   HGB 13.5 14.1  HCT 44.4 46.8  MCV 91.5 92.3  PLT 254 276   Cardiac Enzymes: No results for input(s): CKTOTAL, CKMB, CKMBINDEX, TROPONINI in the last 168 hours. BNP: Invalid input(s): POCBNP CBG: Recent Labs  Lab 08/10/19 2041 08/11/19 0740  GLUCAP 199* 246*    Time coordinating discharge:  36 minutes  Signed:  Orson Eva, DO [facility] Pager: [phone] 08/11/2019, 11:13 AM

## 2019-08-11 NOTE — Progress Notes (Signed)
Inpatient Diabetes Program Recommendations  AACE/ADA: New Consensus Statement on Inpatient Glycemic Control   Target Ranges:  Prepandial:   less than 140 mg/dL      Peak postprandial:   less than 180 mg/dL (1-2 hours)      Critically ill patients:  140 - 180 mg/dL  Results for Francisco Powell, Francisco Powell (MRN [id number]) as of 08/11/2019 10:10  Ref. Range 08/10/2019 20:41 08/11/2019 07:40  Glucose-Capillary Latest Ref Range: 70 - 99 mg/dL 199 (H) 246 (H)    Review of Glycemic Control  Diabetes history: DM2 Outpatient Diabetes medications: Levemir 54 units QAM, Levemir 62 units QPM, Novolog 25 units TID with meals, Metformin 500 mg BID Current orders for Inpatient glycemic control: Levemir 40 units BID  Inpatient Diabetes Program Recommendations:    Insulin-Correction: Please consider ordering Novolog 0-15 units TID with meals and Novolog 0-5 units QHS.  Thanks, Barnie Alderman, RN, MSN, CDE Diabetes Coordinator Inpatient Diabetes Program [phone] (Team Pager from 8am to 5pm)

## 2019-08-11 NOTE — Plan of Care (Signed)

## 2019-08-11 NOTE — Progress Notes (Addendum)
Progress Note  Patient Name: Francisco Powell Date of Encounter: 08/11/2019  Primary Cardiologist:  Rozann Lesches, MD  Subjective   Patient denies any chest pain or shortness of breath overnight.  He is oriented to name place and day. Although not very active, he states he gets around with his walker without any trouble. He never gets chest pain or shortness of breath with exertion.  Inpatient Medications    Scheduled Meds: . amLODipine  10 mg Oral Daily  . aspirin  81 mg Oral Daily  . carvedilol  25 mg Oral BID WC  . DULoxetine  60 mg Oral Daily  . enoxaparin (LOVENOX) injection  40 mg Subcutaneous Q24H  . gabapentin  300 mg Oral BID  . insulin detemir  40 Units Subcutaneous BID  . lisinopril  40 mg Oral Daily  . pravastatin  80 mg Oral q1800   Continuous Infusions:  PRN Meds: acetaminophen **OR** acetaminophen, ALPRAZolam, hydrALAZINE, nitroGLYCERIN, ondansetron **OR** ondansetron (ZOFRAN) IV   Vital Signs    Vitals:   08/10/19 1817 08/10/19 1950 08/10/19 2047 08/11/19 0459  BP: (!) 147/100  (!) 184/91 (!) 178/90  Pulse: 83  65 76  Resp:   20 20  Temp:   98.8 F (37.1 C) 97.8 F (36.6 C)  TempSrc:   Oral   SpO2: 95%  93% 91%  Weight:  125.2 kg    Height:  6\' 2"  (1.88 m)      Intake/Output Summary (Last 24 hours) at 08/11/2019 0747 Last data filed at 08/11/2019 [id number] Gross per 24 hour  Intake 240 ml  Output 1100 ml  Net -860 ml   Filed Weights   08/10/19 1005 08/10/19 1950  Weight: 125.2 kg 125.2 kg   Last Weight  Most recent update: 08/10/2019  7:50 PM   Weight  125.2 kg (276 lb)           Weight change:    Telemetry    SR, RBBB, frequent PACs and occasional PVCs- Personally Reviewed  ECG    None today- Personally Reviewed  Physical Exam   General: Well developed, elderly, male appearing in no acute distress. Head: Normocephalic, atraumatic.  Neck: Supple without bruits, JVD not seen elevated but difficult to assess secondary to body  habitus. Lungs:  Resp regular and unlabored, CTA. Heart: RRR, S1, S2, no S3, S4, or murmur; no rub. Abdomen: Soft, non-tender, non-distended with normoactive bowel sounds. No hepatomegaly. No rebound/guarding. No obvious abdominal masses. Extremities: No clubbing, cyanosis, no edema. Distal pedal pulses are 2+ bilaterally. Neuro: Alert and oriented X 3. Moves all extremities spontaneously. Psych: Normal affect.  Labs    Hematology Recent Labs  Lab 08/10/19 1108 08/11/19 0408  WBC 11.4* 10.7*  RBC 4.85 5.07  HGB 13.5 14.1  HCT 44.4 46.8  MCV 91.5 92.3  MCH 27.8 27.8  MCHC 30.4 30.1  RDW 17.0* 17.2*  PLT 254 276    Chemistry Recent Labs  Lab 08/10/19 1108 08/11/19 0408  NA 141 145  K 3.7 3.9  CL 104 101  CO2 26 29  GLUCOSE 223* 225*  BUN 16 18  CREATININE 0.90 0.94  CALCIUM 9.1 9.5  PROT 7.7  --   ALBUMIN 3.9  --   AST 21  --   ALT 17  --   ALKPHOS 55  --   BILITOT 0.5  --   GFRNONAA >60 >60  GFRAA >60 >60  ANIONGAP 11 15     High Sensitivity Troponin:  Recent Labs  Lab 08/10/19 1108 08/10/19 1226 08/10/19 1911 08/10/19 2005  TROPONINIHS 10 11 12 13      Lab Results  Component Value Date   CHOL 154 08/11/2019   HDL 37 (L) 08/11/2019   LDLCALC 47 08/11/2019   TRIG 349 (H) 08/11/2019   CHOLHDL 4.2 08/11/2019   Lab Results  Component Value Date   HGBA1C 8.2 (H) 08/11/2019   Lab Results  Component Value Date   TSH 3.190 11/09/2013    Radiology    DG Chest Portable 1 View  Result Date: 08/10/2019 CLINICAL DATA:  Left lower chest pain beginning this morning. EXAM: PORTABLE CHEST 1 VIEW COMPARISON:  07/24/2017 FINDINGS: Cardiac silhouette is normal in size. No mediastinal or hilar masses. No evidence of adenopathy. Clear lungs.  No pleural effusion or pneumothorax. Skeletal structures are grossly intact. IMPRESSION: No active disease. Electronically Signed   By: Lajean Manes M.D.   On: 08/10/2019 10:34   ECHOCARDIOGRAM COMPLETE  Result Date:  08/10/2019    ECHOCARDIOGRAM REPORT   Patient Name:   ELIASAR POMYKALA Date of Exam: 08/10/2019 Medical Rec #:  [id number]        Height:       74.0 in Accession #:    [id number]       Weight:       276.0 lb Date of Birth:  [birth year]        BSA:          2.492 m Patient Age:    79 years         BP:           188/84 mmHg Patient Gender: M                HR:           80 bpm. Exam Location:  [facility] Procedure: 2D Echo, Cardiac Doppler and Color Doppler Indications:    Chest Pain 786.50 / R07.9  History:        Patient has prior history of Echocardiogram examinations, most                 recent 09/09/2011. Previous Myocardial Infarction; Risk                 Factors:Hypertension, Diabetes and Dyslipidemia. Bifascicular                 block - RBBB, LAFB,Morbid obesity,S/P PTCA (percutaneous                 transluminal coronary angioplasty) mLAD with DES 11/11/13; DES to                 [facility] 11/09/13.  Sonographer:    Alvino Chapel RCS Referring Phys: [id number] [place]  1. Left ventricular ejection fraction, by estimation, is 60 to 65%. The left ventricle has normal function. The left ventricle has no regional wall motion abnormalities. There is moderate left ventricular hypertrophy. Left ventricular diastolic parameters are consistent with Grade I diastolic dysfunction (impaired relaxation).  2. Right ventricular systolic function is normal. The right ventricular size is normal.  3. The mitral valve is normal in structure and function. No evidence of mitral valve regurgitation. No evidence of mitral stenosis.  4. The aortic valve is tricuspid. Aortic valve regurgitation is not visualized. No aortic stenosis is present. FINDINGS  Left Ventricle: Left ventricular ejection fraction, by estimation, is 60 to 65%. The left ventricle has normal function. The left  ventricle has no regional wall motion abnormalities. The left ventricular internal cavity size was normal in size. There is  moderate left  ventricular hypertrophy. Left ventricular diastolic parameters are consistent with Grade I diastolic dysfunction (impaired relaxation). Normal left ventricular filling pressure. Right Ventricle: The right ventricular size is normal. No increase in right ventricular wall thickness. Right ventricular systolic function is normal. Left Atrium: Left atrial size was normal in size. Right Atrium: Right atrial size was normal in size. Pericardium: There is no evidence of pericardial effusion. Mitral Valve: The mitral valve is normal in structure and function. No evidence of mitral valve regurgitation. No evidence of mitral valve stenosis. Tricuspid Valve: The tricuspid valve is normal in structure. Tricuspid valve regurgitation is not demonstrated. No evidence of tricuspid stenosis. Aortic Valve: The aortic valve is tricuspid. Aortic valve regurgitation is not visualized. No aortic stenosis is present. Aortic valve mean gradient measures 4.0 mmHg. Aortic valve peak gradient measures 8.4 mmHg. Aortic valve area, by VTI measures 2.03 cm. Pulmonic Valve: The pulmonic valve was not well visualized. Pulmonic valve regurgitation is not visualized. No evidence of pulmonic stenosis. Aorta: The aortic root is normal in size and structure. Pulmonary Artery: Indeterminant PASP, inadequate TR Jet. Venous: The inferior vena cava was not well visualized. IAS/Shunts: No atrial level shunt detected by color flow Doppler.  LEFT VENTRICLE PLAX 2D LVIDd:         5.34 cm  Diastology LVIDs:         3.10 cm  LV e' lateral:   5.11 cm/s LV PW:         1.57 cm  LV E/e' lateral: 12.0 LV IVS:        1.39 cm  LV e' medial:    5.22 cm/s LVOT diam:     2.00 cm  LV E/e' medial:  11.7 LV SV:         57 LV SV Index:   23 LVOT Area:     3.14 cm  RIGHT VENTRICLE RV S prime:     15.60 cm/s TAPSE (M-mode): 2.8 cm LEFT ATRIUM             Index LA diam:        3.50 cm 1.40 cm/m LA Vol (A2C):   62.5 ml 25.08 ml/m LA Vol (A4C):   56.9 ml 22.83 ml/m LA Biplane  Vol: 61.2 ml 24.56 ml/m  AORTIC VALVE AV Area (Vmax):    2.00 cm AV Area (Vmean):   2.29 cm AV Area (VTI):     2.03 cm AV Vmax:           145.00 cm/s AV Vmean:          93.500 cm/s AV VTI:            0.282 m AV Peak Grad:      8.4 mmHg AV Mean Grad:      4.0 mmHg LVOT Vmax:         92.50 cm/s LVOT Vmean:        68.300 cm/s LVOT VTI:          0.182 m LVOT/AV VTI ratio: 0.65  AORTA Ao Root diam: 3.90 cm MITRAL VALVE MV Area (PHT): 1.70 cm     SHUNTS MV Decel Time: 447 msec     Systemic VTI:  0.18 m MV E velocity: 61.30 cm/s   Systemic Diam: 2.00 cm MV A velocity: 113.00 cm/s MV E/A ratio:  0.54 Carlyle Dolly MD Electronically signed  by Carlyle Dolly MD Signature Date/Time: 08/10/2019/5:12:26 PM    Final      Cardiac Studies   ECHO:  08/10/2019 1. Left ventricular ejection fraction, by estimation, is 60 to 65%. The  left ventricle has normal function. The left ventricle has no regional  wall motion abnormalities. There is moderate left ventricular hypertrophy.  Left ventricular diastolic  parameters are consistent with Grade I diastolic dysfunction (impaired  relaxation).  2. Right ventricular systolic function is normal. The right ventricular  size is normal.  3. The mitral valve is normal in structure and function. No evidence of  mitral valve regurgitation. No evidence of mitral stenosis.  4. The aortic valve is tricuspid. Aortic valve regurgitation is not  visualized. No aortic stenosis is present.   Patient Profile     79 y.o. male w/ hx DM, HTN, HLD, NSTEMI 2015 s/p DES LAD & RCA, renal CA, anemia, gout, asbestosis, RBBB, LAFB  Assessment & Plan    1. Chest pain:  - atypical, no hx exertional chest pain (although exertion level is very low) - ez w/ minimal elevation and no sig trend, not c/w ACS - EF nl on echo w/ no WMA and no sig valvular abnormalities - CXR NAD - sig hypertensive on admission w/ BP as high as 203/102 - At this time, no further inpatient work-up  indicated. -We will arrange a follow-up appointment with Dr. Domenic Polite  2.  Irregular heart rate: -His heart rate is currently regular, but on telemetry review, he has frequent PACs and occasional PVCs -Monitor was interpreted as A. fib, but believe it to be sinus  3. HTN -Blood pressure significantly elevated on admission -Moderate LVH on echo -He is on maximum doses of amlodipine, carvedilol, and lisinopril. -Consider adding low-dose diuretic, potassium sparing diuretic such as spironolactone or chlorthalidone or hydralazine for improved blood pressure control  Otherwise, per IM Active Problems:   Hyperlipidemia   Bifascicular block - RBBB, LAFB   Essential hypertension   Coronary atherosclerosis of native coronary artery   Chest pain   Major depression in remission [facility])   Uncontrolled type 2 diabetes mellitus with hyperglycemia ([place])   Signed, Rosaria Ferries , PA-C 7:47 AM 08/11/2019 Pager: [phone]   The patient was seen and examined, and I agree with the history, physical exam, assessment and plan as documented above, with modifications made above and as noted below. I have also personally reviewed all relevant documentation, old records, labs, and both radiographic and cardiovascular studies. I have also independently interpreted old and new ECG's.  He denies chest pain and palpitations this morning. He has some nausea. He would like to go home. I reviewed telemetry which demonstrates sinus rhythm with PACs and PVCs.  Blood pressure remains significantly elevated. It is quite possible his episode of chest pain was related to this.  Echocardiogram demonstrates normal LV systolic function and regional wall motion with moderate LVH.  Recommendations: I would recommend adding spironolactone 25 mg daily to help control blood pressure. He will need to follow-up basic metabolic panel next week to monitor renal function and potassium.  We will arrange follow-up with Dr.  Domenic Polite. I will defer consideration for outpatient nuclear stress testing to him.  Kate Sable, MD, [facility]  08/11/2019 9:35 AM

## 2019-08-11 NOTE — Progress Notes (Signed)
Discharge instructions given to patient and daughter. Both verbalized understanding of meds and follow up appointments. No acute distress noted. Left unit via w/c.

## 2019-08-12 ENCOUNTER — Telehealth: Payer: Self-pay | Admitting: Family Medicine

## 2019-08-12 NOTE — Telephone Encounter (Signed)
Nurse's-patient recently discharged from the hospital. Please call patient, let them know that we are aware that they were discharged from the hospital. Please schedule them to follow-up with [place] within the next 7 days. Advised the patient to bring all of their medications with him to the visit. Please inquire if they are having any acute issues currently and documented accordingly.  Patient was in the hospital for atypical chest pain as well as other issues He will need to do a follow-up within the next 10 days This can be virtual, or in person, or car visit what ever they prefer Also it will be necessary at that visit for them to adequately be able to go through their med list. If they choose to come to the office they will either need to bring their medicines with them or will need to bring a written out list of what they are taking so we can make sure that our list compares favorably.  Please also document if there is anything he needs.  If he is having problems or troubles it would be best to do the office visit this week.  If stable then the office visit early next week.  Thanks-Dr. Nicki Reaper

## 2019-08-13 NOTE — ED Provider Notes (Signed)
[place] [facility] Provider Note   CSN: [id number] Arrival date & time: 08/10/19  [id number]     History Chief Complaint  Patient presents with  . Chest Pain    Francisco Powell is a 79 y.o. male.  Patient states he had chest pain today.  Patient took 1 nitro and the pain improved  The history is provided by the patient. No language interpreter was used.  Chest Pain Pain location:  L chest Pain quality: aching   Pain radiates to:  Does not radiate Pain severity:  Moderate Onset quality:  Sudden Timing:  Constant Progression:  Improving Chronicity:  New Relieved by:  Nothing Associated symptoms: no abdominal pain, no back pain, no cough, no fatigue and no headache        Past Medical History:  Diagnosis Date  . Anemia, iron deficiency   . Anxiety   . Asbestosis(501)   . Coronary atherosclerosis of native coronary artery    DES RCA and staged DES LAD May 2015, LVEF 55%  . Essential hypertension, benign   . Gout   . Hx of adenomatous colonic polyps   . Hyperlipidemia   . Kidney carcinoma ([place]) 1998  . Lumbar back pain    Spondylosis and spondylolisthesis   . Myocardial infarction ([place]) 2004  . Nephrolithiasis   . NSTEMI (non-ST elevated myocardial infarction) [facility])    May 2015  . Obesity   . Sciatica of right side   . Type II diabetes mellitus [facility])     Patient Active Problem List   Diagnosis Date Noted  . Accelerated hypertension   . Coronary artery disease   . Cardiac arrhythmia   . Uncontrolled type 2 diabetes mellitus with hyperglycemia ([place]) 08/10/2019  . Major depression in remission ([place]) 10/07/2018  . Morbid obesity ([place]) 10/07/2018  . Chest pain 07/24/2017  . Depression, major, single episode, in partial remission ([place]) 10/01/2016  . Diarrhea 04/20/2016  . UTI (urinary tract infection) 03/30/2016  . Fall at home 03/30/2016  . Generalized weakness 03/30/2016  . Acute encephalopathy 03/30/2016  . Hypokalemia 03/30/2016  . Chronic  low back pain 03/30/2016  . Pressure ulcer of ankle 03/30/2016  . Pressure ulcer, sacrum 03/30/2016  . Right leg weakness 03/18/2016  . Insulin dependent diabetes mellitus 08/15/2015  . History of colonic polyps   . Hx of adenomatous colonic polyps 04/04/2015  . Elevated PSA 09/04/2014  . Adenomatous polyps 03/06/2014  . FH: colon cancer 03/06/2014  . Helicobacter pylori (H. pylori) infection 03/06/2014  . Postherpetic neuralgia 02/28/2014  . Depression 01/14/2014  . Coronary atherosclerosis of native coronary artery 11/11/2013  . Bifascicular block - RBBB, LAFB 11/10/2013  . Essential hypertension 11/10/2013  . Hyperlipidemia 10/31/2008    Past Surgical History:  Procedure Laterality Date  . APPENDECTOMY    . BACK SURGERY  X 4  . CHOLECYSTECTOMY     Biliary dyskinesia  . COLONOSCOPY  2009   Dr. Arnoldo Morale: normal  . COLONOSCOPY  June 2011   Dr. Gala Romney: normal rectum, left-sided diverticula  . COLONOSCOPY N/A 04/12/2015   Procedure: COLONOSCOPY;  Surgeon: [name], MD;  [facility];  Service: Endoscopy;  Laterality: N/A;  1115   . CYSTOSCOPY W/ LITHOLAPAXY / EHL  2009  . ESOPHAGOGASTRODUODENOSCOPY  June 2011   Dr. Gala Romney: erosive reflux esophagitis, small hiatal hernia, +H.pylori gastritis  . EYE SURGERY    . HOLMIUM LASER APPLICATION Right [id number]  . LEFT HEART CATHETERIZATION WITH CORONARY ANGIOGRAM  N/A 11/09/2013   Procedure: LEFT HEART CATHETERIZATION WITH CORONARY ANGIOGRAM;  Surgeon: [name], MD;  Location: [facility] C[facility] LAB;  Service: Cardiovascular;  Laterality: N/A;  . LUMBAR DISC SURGERY  X 3  . PERCUTANEOUS CORONARY STENT INTERVENTION (PCI-S) N/A 11/10/2013   Procedure: PERCUTANEOUS CORONARY STENT INTERVENTION (PCI-S);  Surgeon: [name], MD;  Location: [facility] CATH LAB;  Service: Cardiovascular;  Laterality: N/A;  . POSTERIOR LUMBAR FUSION  2014  . RETINAL DETACHMENT SURGERY Right 1998   Secondary to injury detached retina/cataracts  .  TONSILLECTOMY    . TUMOR EXCISION  1995       Family History  Problem Relation Age of Onset  . Colon cancer Mother        deceased   . Cancer Maternal Grandmother        Gastric cancer  . Breast cancer Sister   . Heart attack Father   . Colon cancer Maternal Uncle   . Colon cancer Maternal Uncle   . Colon cancer Other        maternal great uncle  . Colon polyps Sister     Social History   Tobacco Use  . Smoking status: Never Smoker  . Smokeless tobacco: Current User    Types: Chew  Substance Use Topics  . Alcohol use: No    Alcohol/week: 0.0 standard drinks  . Drug use: No    Home Medications Prior to Admission medications   Medication Sig Start Date End Date Taking? Authorizing Provider  ALPRAZolam Duanne Moron) 1 MG tablet Take 0.5-1 tablets (0.5-1 mg total) by mouth at bedtime as needed. for sleep 06/20/19  Yes Luking, Scott A, MD  amLODipine (NORVASC) 10 MG tablet TAKE (1) TABLET BY MOUTH ONCE DAILY. 06/20/19  Yes [name], MD  aspirin (ASPIRIN 81) 81 MG chewable tablet Chew 81 mg by mouth daily.   Yes [provider]  carvedilol (COREG) 25 MG tablet TAKE (1) TABLET BY MOUTH TWICE DAILY. 06/20/19  Yes Luking, Scott A, MD  diphenhydramine-acetaminophen (TYLENOL PM) 25-500 MG TABS tablet Take 1 tablet by mouth at bedtime as needed.   Yes [provider]  DULoxetine (CYMBALTA) 60 MG capsule Take 1 capsule (60 mg total) by mouth daily. 06/20/19  Yes Luking, Elayne Snare, MD  gabapentin (NEURONTIN) 300 MG capsule Take one capsule po twice daily. CAUTION:DROWSINESS 06/20/19  Yes Luking, Nicki Reaper A, MD  insulin detemir (LEVEMIR) 100 UNIT/ML injection INJECT SUBCUTANEOUSLY 54  UNITS IN THE MORNING AND 62 UNITS IN THE EVENING 08/01/19  Yes Luking, Scott A, MD  insulin lispro (HUMALOG) 100 UNIT/ML injection Inject 25 units into skin 3 times per day 07/07/19  Yes Luking, Scott A, MD  lidocaine (XYLOCAINE) 2 % jelly Apply 1 application topically as needed. 08/04/17  Yes [name], MD  lisinopril (ZESTRIL) 40 MG tablet Take 1 tablet (40 mg total) by mouth daily. 06/20/19  Yes Luking, Elayne Snare, MD  Melatonin 10 MG TABS Take 10 mg by mouth at bedtime.   Yes [provider]  meloxicam (MOBIC) 7.5 MG tablet Take 1 tablet (7.5 mg total) by mouth daily. 07/07/19  Yes Luking, Elayne Snare, MD  metFORMIN (GLUCOPHAGE) 500 MG tablet TAKE 1 TABLET BY MOUTH TWICE DAILY WITH A MEAL. 06/20/19  Yes Luking, Elayne Snare, MD  Multiple Vitamins-Minerals (MULTIVITAMIN WITH MINERALS) tablet Take 1 tablet by mouth daily.   Yes [provider]  nitroGLYCERIN (NITROSTAT) 0.4 MG SL tablet Place 1 tablet (0.4 mg total)  under the tongue every 5 (five) minutes x 3 doses as needed for chest pain. 08/04/17  Yes Luking, Elayne Snare, MD  NOVOLOG FLEXPEN 100 UNIT/ML FlexPen INJECT 25 UNITS INTO THE  SKIN 3 TIMES DAILY 06/29/19  Yes [name], MD  pravastatin (PRAVACHOL) 80 MG tablet TAKE 1 TABLET BY MOUTH AT BEDTIME FOR CHOLESTEROL 06/20/19  Yes Luking, Scott A, MD  spironolactone (ALDACTONE) 25 MG tablet Take 1 tablet (25 mg total) by mouth daily. 08/12/19   Orson Eva, MD    Allergies    Penicillins, Other, Atorvastatin, and Celexa [citalopram]  Review of Systems   Review of Systems  Constitutional: Negative for appetite change and fatigue.  HENT: Negative for congestion, ear discharge and sinus pressure.   Eyes: Negative for discharge.  Respiratory: Negative for cough.   Cardiovascular: Positive for chest pain.  Gastrointestinal: Negative for abdominal pain and diarrhea.  Genitourinary: Negative for frequency and hematuria.  Musculoskeletal: Negative for back pain.  Skin: Negative for rash.  Neurological: Negative for seizures and headaches.  Psychiatric/Behavioral: Negative for hallucinations.    Physical Exam Updated Vital Signs BP (!) 178/90   Pulse 76   Temp 97.8 F (36.6 C)   Resp 20   Ht 6\' 2"  (1.88 m)   Wt 125.2 kg   SpO2 91%   BMI 35.44 kg/m   Physical Exam  Vitals and nursing note reviewed.  Constitutional:      Appearance: He is well-developed.  HENT:     Head: Normocephalic.     Nose: Nose normal.  Eyes:     General: No scleral icterus.    Conjunctiva/sclera: Conjunctivae normal.  Neck:     Thyroid: No thyromegaly.  Cardiovascular:     Rate and Rhythm: Normal rate and regular rhythm.     Heart sounds: No murmur. No friction rub. No gallop.   Pulmonary:     Breath sounds: No stridor. No wheezing or rales.  Chest:     Chest wall: No tenderness.  Abdominal:     General: There is no distension.     Tenderness: There is no abdominal tenderness. There is no rebound.  Musculoskeletal:        General: Normal range of motion.     Cervical back: Neck supple.  Lymphadenopathy:     Cervical: No cervical adenopathy.  Skin:    Findings: No erythema or rash.  Neurological:     Mental Status: He is alert and oriented to person, place, and time.     Motor: No abnormal muscle tone.     Coordination: Coordination normal.  Psychiatric:        Behavior: Behavior normal.     ED Results / Procedures / Treatments   Labs (all labs ordered are listed, but only abnormal results are displayed) Labs Reviewed  CBC WITH DIFFERENTIAL/PLATELET - Abnormal; Notable for the following components:      Result Value   WBC 11.4 (*)    RDW 17.0 (*)    Neutro Abs 7.8 (*)    Monocytes Absolute 1.1 (*)    All other components within normal limits  COMPREHENSIVE METABOLIC PANEL - Abnormal; Notable for the following components:   Glucose, Bld 223 (*)    All other components within normal limits  BASIC METABOLIC PANEL - Abnormal; Notable for the following components:   Glucose, Bld 225 (*)    All other components within normal limits  CBC - Abnormal; Notable for the following components:   WBC 10.7 (*)  RDW 17.2 (*)    All other components within normal limits  HEMOGLOBIN A1C - Abnormal; Notable for the following components:   Hgb A1c MFr Bld 8.2 (*)     All other components within normal limits  GLUCOSE, CAPILLARY - Abnormal; Notable for the following components:   Glucose-Capillary 199 (*)    All other components within normal limits  LIPID PANEL - Abnormal; Notable for the following components:   Triglycerides 349 (*)    HDL 37 (*)    VLDL 70 (*)    All other components within normal limits  GLUCOSE, CAPILLARY - Abnormal; Notable for the following components:   Glucose-Capillary 246 (*)    All other components within normal limits  GLUCOSE, CAPILLARY - Abnormal; Notable for the following components:   Glucose-Capillary 263 (*)    All other components within normal limits  SARS CORONAVIRUS 2 (TAT 6-24 HRS)  TROPONIN I (HIGH SENSITIVITY)  TROPONIN I (HIGH SENSITIVITY)  TROPONIN I (HIGH SENSITIVITY)  TROPONIN I (HIGH SENSITIVITY)    EKG EKG Interpretation  Date/Time:  Thursday August 10 2019 17:01:07 EST Ventricular Rate:  77 PR Interval:    QRS Duration: 154 QT Interval:  414 QTC Calculation: 469 R Axis:   -74 Text Interpretation: Atrial fibrillation Ventricular premature complex Right bundle branch block Inferior infarct, old Artifact in lead(s) I II aVR aVL Confirmed by Quintella Reichert [phone]) on 08/11/2019 4:11:31 PM   Radiology No results found.  Procedures Procedures (including critical care time)  Medications Ordered in ED Medications  Melatonin TABS 3 mg (3 mg Oral Given 08/10/19 2152)    ED Course  I have reviewed the triage vital signs and the nursing notes.  Pertinent labs & imaging results that were available during my care of the patient were reviewed by me and considered in my medical decision making (see chart for details).    MDM Rules/Calculators/A&P                      Patient with chest pain and history of coronary artery disease.  I spoke with cardiology and they recommend admission to the hospital with cycling troponins. Final Clinical Impression(s) / ED Diagnoses Final diagnoses:   None    Rx / DC Orders ED Discharge Orders         Ordered    spironolactone (ALDACTONE) 25 MG tablet  Daily     08/11/19 1100           Milton Ferguson, MD 08/13/19 1000

## 2019-08-14 NOTE — Telephone Encounter (Signed)
Discussed with daughter(DPR). Patient scheduled for a virtual visit and will have all meds at visit for discussion. Daughter states he is having no further problems.

## 2019-08-22 ENCOUNTER — Other Ambulatory Visit: Payer: Self-pay

## 2019-08-22 ENCOUNTER — Ambulatory Visit (INDEPENDENT_AMBULATORY_CARE_PROVIDER_SITE_OTHER): Payer: Medicare Other | Admitting: Family Medicine

## 2019-08-22 ENCOUNTER — Encounter: Payer: Self-pay | Admitting: Family Medicine

## 2019-08-22 DIAGNOSIS — E1165 Type 2 diabetes mellitus with hyperglycemia: Secondary | ICD-10-CM

## 2019-08-22 DIAGNOSIS — E782 Mixed hyperlipidemia: Secondary | ICD-10-CM

## 2019-08-22 DIAGNOSIS — I10 Essential (primary) hypertension: Secondary | ICD-10-CM | POA: Diagnosis not present

## 2019-08-22 DIAGNOSIS — R079 Chest pain, unspecified: Secondary | ICD-10-CM

## 2019-08-22 DIAGNOSIS — E1142 Type 2 diabetes mellitus with diabetic polyneuropathy: Secondary | ICD-10-CM

## 2019-08-22 HISTORY — DX: Type 2 diabetes mellitus with diabetic polyneuropathy: E11.42

## 2019-08-22 MED ORDER — SPIRONOLACTONE 25 MG PO TABS: 25.0000 mg | ORAL_TABLET | Freq: Every day | ORAL | 1 refills | Status: DC

## 2019-08-22 MED ORDER — GABAPENTIN 300 MG PO CAPS: ORAL_CAPSULE | ORAL | 1 refills | Status: DC

## 2019-08-22 NOTE — Progress Notes (Addendum)
Subjective:    Patient ID: Francisco Powell, male    DOB: 1940/12/26, 79 y.o.   MRN: [id number]  HPI  Patient calls for a follow up on recent hospitalization for chest pain. Patient is doing well- very little exercise due to leg issues. Some days are better than others with leg issues. Very nice patient Was in the hospital for chest pain Had troponin test completed They let him go recommend an outpatient visit with Dr. Domenic Polite with the possibility of having a stress test coming up Virtual Visit via Video Note  I connected with Francisco Powell on 08/22/19 at  3:00 PM EST by a video enabled telemedicine application and verified that I am speaking with the correct person using two identifiers.  Location: Patient: home Provider: office   I discussed the limitations of evaluation and management by telemedicine and the availability of in person appointments. The patient expressed understanding and agreed to proceed.  History of Present Illness:    Observations/Objective:   Assessment and Plan:   Follow Up Instructions:    I discussed the assessment and treatment plan with the patient. The patient was provided an opportunity to ask questions and all were answered. The patient agreed with the plan and demonstrated an understanding of the instructions.   The patient was advised to call back or seek an in-person evaluation if the symptoms worsen or if the condition fails to improve as anticipated.  I provided 30 minutes of non-face-to-face time during this encounter. 30 minutes included review of discharge summary lab work discussion with the patient ordering labs documentation and after visit summary letter to the daughter regarding the patient  Fall Risk  08/22/2019 04/11/2019 01/12/2019 08/15/2015 06/01/2014  Falls in the past year? 0 0 (No Data) No No  Comment - - Emmi Telephone Survey: data to providers prior to load - -  Number falls in past yr: - - (No Data) - -  Comment - -  Emmi Telephone Survey Actual Response =  - -  Risk for fall due to : Impaired balance/gait;Impaired mobility Impaired balance/gait;Impaired mobility - - -  Follow up Falls evaluation completed Falls evaluation completed - - -      Review of Systems  Constitutional: Negative for activity change, fatigue and fever.  HENT: Negative for congestion and rhinorrhea.   Respiratory: Negative for cough and shortness of breath.   Cardiovascular: Negative for chest pain and leg swelling.  Gastrointestinal: Negative for abdominal pain, diarrhea, nausea and vomiting.  Genitourinary: Negative for dysuria and hematuria.  Neurological: Negative for weakness and headaches.  Psychiatric/Behavioral: Negative for agitation, behavioral problems and confusion.  Patient relates bilateral foot pain     Objective:   Physical Exam  Virtual visit unable to do physical exam      Assessment & Plan:  1. Essential hypertension Blood pressure reportedly under good control try to watch salt continue medication watch diet closely In the hospital they did put him on spironolactone we will need to check a metabolic 7 again in several weeks time to make sure that potassium is doing okay 2. Uncontrolled type 2 diabetes mellitus with hyperglycemia ([place]) Very difficult for this patient to keep it under control.  Will need to bump up the insulin The daughter will monitor his glucose readings and send [place] some readings within a couple weeks time  3. Mixed hyperlipidemia Cholesterol issues very important continue with diet medication  4. Chest pain, unspecified type Concerning in regards to all  the risk factors he has follow-up with Dr. Domenic Polite we will help set that referral they recommended consideration for outpatient stress test  5. Morbid obesity ([place]) Patient with morbid obesity tries to watch portions unable to do much in the way of physical activity.  Tries to keep his weight in check  6. Diabetic peripheral  neuropathy ([facility]) Severe peripheral neuropathy we will bump up the dose of the gabapentin if that does not do well enough then the next step would be is consideration for hydrocodone I am not sure tramadol gets along with his other medicines certainly we would check into that  Face-to-face evaluation was completed via virtual video visit. It should be noted that this patient does 5 shots daily with insulin.  Therefore insulin supplies such as needles syringes for this amount daily is medically necessary.  Face-to-face evaluation was completed via virtual video visit.  Patient does have impaired mobility.  Does need a wheelchair to get around.  It is necessary for him to complete his ADLs.  Patient takes long-acting insulin twice daily. Also takes short acting insulin 3 times daily with meals Patient checks his sugars 4-5 times daily. This is medically necessary because of fragile uncontrolled type 2 diabetes dependent on insulin

## 2019-08-23 ENCOUNTER — Other Ambulatory Visit: Payer: Self-pay | Admitting: *Deleted

## 2019-08-23 DIAGNOSIS — I10 Essential (primary) hypertension: Secondary | ICD-10-CM

## 2019-08-23 DIAGNOSIS — R079 Chest pain, unspecified: Secondary | ICD-10-CM

## 2019-08-23 DIAGNOSIS — Z79899 Other long term (current) drug therapy: Secondary | ICD-10-CM

## 2019-08-23 MED ORDER — INSULIN DETEMIR 100 UNIT/ML ~~LOC~~ SOLN: SUBCUTANEOUS | 0 refills | Status: DC

## 2019-08-23 NOTE — Telephone Encounter (Signed)
Left message to return call with pam to discuss and to clarify novolog. Dr Nicki Reaper said to cancel humalog and do novolog but pt's message said pharm switched him to humalog and he is not doing novolog.

## 2019-08-23 NOTE — Telephone Encounter (Signed)
Nurses Please switch his short acting insulin from Humalog to NovoLog this is what the patient is getting currently  Please forward the following message to York Pellant via MyChart  The gabapentin should be 1 in the morning 2 in the evening a new prescription was sent it Stop meloxicam as discussed Give [place] an update within a few weeks how the feet are doing  As for the insulin I would recommend increasing long-acting insulin to 58 units in the morning 62 units in the evening then give [place] feedback regarding glucose readings within a couple weeks Thanks-Dr. Nicki Reaper

## 2019-08-24 ENCOUNTER — Other Ambulatory Visit: Payer: Self-pay | Admitting: *Deleted

## 2019-08-24 ENCOUNTER — Encounter: Payer: Self-pay | Admitting: Family Medicine

## 2019-08-24 MED ORDER — LISINOPRIL 40 MG PO TABS: ORAL_TABLET | ORAL | 1 refills | Status: DC

## 2019-08-24 MED ORDER — INSULIN LISPRO 100 UNIT/ML ~~LOC~~ SOLN: SUBCUTANEOUS | 5 refills | Status: DC

## 2019-08-24 MED ORDER — NOVOLOG FLEXPEN 100 UNIT/ML ~~LOC~~ SOPN: PEN_INJECTOR | SUBCUTANEOUS | 5 refills | Status: DC

## 2019-08-24 NOTE — Telephone Encounter (Signed)
Pam called back and I discussed all with her and she states insurance pays for humalog not novolog and he is taking 25 units tid. He does need a refill to [facility]. Is it ok to change and send in refills.

## 2019-08-24 NOTE — Telephone Encounter (Signed)
Also daughter wanted to let you know that he is suppose to have bw early next week but it will be 2 weeks before she can take him to lab

## 2019-08-24 NOTE — Telephone Encounter (Signed)
It is fine go ahead make the change in regards to Humalog  It is fine if they would do the blood work in 2 weeks but definitely needs to get done by that time thank you  I would also recommend reducing the lisinopril from 40 mg to 20 mg-he can do a half a tablet a day of the 40 mg-please make this change in epic ( making this change because of spironolactone)

## 2019-08-25 ENCOUNTER — Telehealth: Payer: Self-pay

## 2019-08-25 NOTE — Telephone Encounter (Signed)

## 2019-08-31 DIAGNOSIS — Z79899 Other long term (current) drug therapy: Secondary | ICD-10-CM | POA: Diagnosis not present

## 2019-08-31 DIAGNOSIS — I10 Essential (primary) hypertension: Secondary | ICD-10-CM | POA: Diagnosis not present

## 2019-09-01 ENCOUNTER — Encounter: Payer: Self-pay | Admitting: Family Medicine

## 2019-09-01 LAB — BASIC METABOLIC PANEL
BUN/Creatinine Ratio: 22 (ref 10–24)
BUN: 24 mg/dL (ref 8–27)
CO2: 21 mmol/L (ref 20–29)
Calcium: 9.8 mg/dL (ref 8.6–10.2)
Chloride: 104 mmol/L (ref 96–106)
Creatinine, Ser: 1.11 mg/dL (ref 0.76–1.27)
GFR calc Af Amer: 74 mL/min/{1.73_m2} (ref >59)
GFR calc non Af Amer: 64 mL/min/{1.73_m2} (ref >59)
Glucose: 215 mg/dL — ABNORMAL HIGH (ref 65–99)
Potassium: 4.9 mmol/L (ref 3.5–5.2)
Sodium: 143 mmol/L (ref 134–144)

## 2019-09-06 ENCOUNTER — Encounter: Payer: Self-pay | Admitting: Cardiology

## 2019-09-06 NOTE — Progress Notes (Signed)
Virtual Visit via Telephone Note   This visit type was conducted due to national recommendations for restrictions regarding the COVID-19 Pandemic (e.g. social distancing) in an effort to limit this patient's exposure and mitigate transmission in our community.  Due to his co-morbid illnesses, this patient is at least at moderate risk for complications without adequate follow up.  This format is felt to be most appropriate for this patient at this time.  The patient did not have access to video technology/had technical difficulties with video requiring transitioning to audio format only (telephone).  All issues noted in this document were discussed and addressed.  No physical exam could be performed with this format.  Please refer to the patient's chart for his  consent to telehealth for [facility].   The patient was identified using 2 identifiers.  Date:  09/07/2019   ID:  Aires, Lem 1940-10-12, MRN [id number]  Patient Location: Home Provider Location: Office  PCP:  [name], MD  Cardiologist:  Rozann Lesches, MD Electrophysiologist:  None   Evaluation Performed:  Follow-Up Visit  Chief Complaint:  Cardiac follow-up  History of Present Illness:    Francisco Powell is a 79 y.o. male last seen in September 2020.  We spoke by phone today, his daughter was on the line as well.  I reviewed interval records.  He was hospitalized in February with chest discomfort.  He ruled out for ACS with high-sensitivity troponin I levels, did have elevated blood pressure resulting in medication adjustments.  Follow-up echocardiogram showed LVEF 60 to 65% without regional wall motion abnormalities.  He was followed in the hospital by Dr. Harl Bowie and Dr. Bronson Ing.  No further ischemic testing was undertaken.   He has had no further chest pain since hospital discharge, no recurrent nitroglycerin use.  Blood pressure this morning was elevated but recent levels were better controlled as  follows:  142/80, 148/78, 150/84  I went over his medications and we discussed adding Imdur 30 mg once in the evenings.  Past Medical History:  Diagnosis Date  . Anemia, iron deficiency   . Anxiety   . Asbestosis(501)   . Coronary atherosclerosis of native coronary artery    DES RCA and staged DES LAD May 2015, LVEF 55%  . Diabetic peripheral neuropathy ([place]) 08/22/2019  . Essential hypertension   . Gout   . Hx of adenomatous colonic polyps   . Hyperlipidemia   . Kidney carcinoma ([place]) 1998  . Lumbar back pain    Spondylosis and spondylolisthesis   . Myocardial infarction ([place]) 2004  . Nephrolithiasis   . NSTEMI (non-ST elevated myocardial infarction) [facility])    May 2015  . Obesity   . Sciatica of right side   . Type II diabetes mellitus ([place])    Past Surgical History:  Procedure Laterality Date  . APPENDECTOMY    . BACK SURGERY  X 4  . CHOLECYSTECTOMY     Biliary dyskinesia  . COLONOSCOPY  2009   Dr. Arnoldo Morale: normal  . COLONOSCOPY  June 2011   Dr. Gala Romney: normal rectum, left-sided diverticula  . COLONOSCOPY N/A 04/12/2015   Procedure: COLONOSCOPY;  Surgeon: [name], MD;  Location: [facility];  Service: Endoscopy;  Laterality: N/A;  1115   . CYSTOSCOPY W/ LITHOLAPAXY / EHL  2009  . ESOPHAGOGASTRODUODENOSCOPY  June 2011   Dr. Gala Romney: erosive reflux esophagitis, small hiatal hernia, +H.pylori gastritis  . EYE SURGERY    . HOLMIUM LASER APPLICATION Right  2009  . LEFT HEART CATHETERIZATION WITH CORONARY ANGIOGRAM N/A 11/09/2013   Procedure: LEFT HEART CATHETERIZATION WITH CORONARY ANGIOGRAM;  Surgeon: [name], MD;  Location: [facility] CATH LAB;  Service: Cardiovascular;  Laterality: N/A;  . LUMBAR DISC SURGERY  X 3  . PERCUTANEOUS CORONARY STENT INTERVENTION (PCI-S) N/A 11/10/2013   Procedure: PERCUTANEOUS CORONARY STENT INTERVENTION (PCI-S);  Surgeon: [name], MD;  Location: [facility] CATH LAB;  Service: Cardiovascular;  Laterality: N/A;  . POSTERIOR LUMBAR  FUSION  2014  . RETINAL DETACHMENT SURGERY Right 1998   Secondary to injury detached retina/cataracts  . TONSILLECTOMY    . TUMOR EXCISION  1995     Current Meds  Medication Sig  . ALPRAZolam (XANAX) 1 MG tablet Take 0.5-1 tablets (0.5-1 mg total) by mouth at bedtime as needed. for sleep  . amLODipine (NORVASC) 10 MG tablet TAKE (1) TABLET BY MOUTH ONCE DAILY.  Marland Kitchen Ascorbic Acid (VITAMIN C) 1000 MG tablet Take 1,000 mg by mouth in the morning and at bedtime.  Marland Kitchen aspirin (ASPIRIN 81) 81 MG chewable tablet Chew 81 mg by mouth daily.  . carvedilol (COREG) 25 MG tablet TAKE (1) TABLET BY MOUTH TWICE DAILY.  . diphenhydramine-acetaminophen (TYLENOL PM) 25-500 MG TABS tablet Take 1 tablet by mouth at bedtime as needed.  . DULoxetine (CYMBALTA) 60 MG capsule Take 1 capsule (60 mg total) by mouth daily.  Marland Kitchen gabapentin (NEURONTIN) 300 MG capsule 1 capsule in the morning 2 capsules in the evening CAUTION:DROWSINESS  . insulin aspart (NOVOLOG FLEXPEN) 100 UNIT/ML FlexPen INJECT 25 UNITS INTO THE SKIN 3 TIMES DAILY.  Marland Kitchen insulin detemir (LEVEMIR) 100 UNIT/ML injection INJECT SUBCUTANEOUSLY 58  UNITS IN THE MORNING AND 62 UNITS IN THE EVENING  . insulin lispro (HUMALOG) 100 UNIT/ML injection 25 units tid  . lidocaine (XYLOCAINE) 2 % jelly Apply 1 application topically as needed.  Marland Kitchen lisinopril (ZESTRIL) 40 MG tablet Take one half tablet daily  . Melatonin 10 MG TABS Take 10 mg by mouth at bedtime.  . metFORMIN (GLUCOPHAGE) 500 MG tablet TAKE 1 TABLET BY MOUTH TWICE DAILY WITH A MEAL.  . Multiple Vitamins-Minerals (MULTIVITAMIN WITH MINERALS) tablet Take 1 tablet by mouth daily.  . nitroGLYCERIN (NITROSTAT) 0.4 MG SL tablet Place 1 tablet (0.4 mg total) under the tongue every 5 (five) minutes x 3 doses as needed for chest pain.  . pravastatin (PRAVACHOL) 80 MG tablet TAKE 1 TABLET BY MOUTH AT BEDTIME FOR CHOLESTEROL  . spironolactone (ALDACTONE) 25 MG tablet Take 1 tablet (25 mg total) by mouth daily.  .  [DISCONTINUED] nitroGLYCERIN (NITROSTAT) 0.4 MG SL tablet Place 1 tablet (0.4 mg total) under the tongue every 5 (five) minutes x 3 doses as needed for chest pain.     Allergies:   Penicillins, Other, Atorvastatin, and Celexa [citalopram]   ROS:   No palpitations or syncope.  Prior CV studies:   The following studies were reviewed today:  Echocardiogram 08/10/2019: 1. Left ventricular ejection fraction, by estimation, is 60 to 65%. The  left ventricle has normal function. The left ventricle has no regional  wall motion abnormalities. There is moderate left ventricular hypertrophy.  Left ventricular diastolic  parameters are consistent with Grade I diastolic dysfunction (impaired  relaxation).  2. Right ventricular systolic function is normal. The right ventricular  size is normal.  3. The mitral valve is normal in structure and function. No evidence of  mitral valve regurgitation. No evidence of mitral stenosis.  4. The aortic valve  is tricuspid. Aortic valve regurgitation is not  visualized. No aortic stenosis is present.   FINDINGS  Left Ventricle: Left ventricular ejection fraction, by estimation, is 60  to 65%. The left ventricle has normal function. The left ventricle has no  regional wall motion abnormalities. The left ventricular internal cavity  size was normal in size. There is  moderate left ventricular hypertrophy. Left ventricular diastolic  parameters are consistent with Grade I diastolic dysfunction (impaired  relaxation). Normal left ventricular filling pressure.   Labs/Other Tests and Data Reviewed:    EKG:  An ECG dated 08/10/2019 was personally reviewed today and demonstrated:  Sinus rhythm with right bundle branch block and left anterior fascicular block, PACs.  Recent Labs: 08/10/2019: ALT 17 08/11/2019: Hemoglobin 14.1; Platelets 276 08/31/2019: BUN 24; Creatinine, Ser 1.11; Potassium 4.9; Sodium 143   Recent Lipid Panel Lab Results  Component Value  Date/Time   CHOL 154 08/11/2019 04:08 AM   CHOL 134 03/23/2019 08:19 AM   TRIG 349 (H) 08/11/2019 04:08 AM   HDL 37 (L) 08/11/2019 04:08 AM   HDL 37 (L) 03/23/2019 08:19 AM   CHOLHDL 4.2 08/11/2019 04:08 AM   LDLCALC 47 08/11/2019 04:08 AM   LDLCALC 61 03/23/2019 08:19 AM    Wt Readings from Last 3 Encounters:  09/07/19 260 lb (117.9 kg)  08/10/19 276 lb (125.2 kg)  03/14/19 275 lb (124.7 kg)     Objective:    Vital Signs:  BP (!) 180/98 Comment: last night 148/78  Pulse (!) 58 Comment: 58-76 ranges  Ht 6' (1.829 m)   Wt 260 lb (117.9 kg)   BMI 35.26 kg/m    I spent the majority of the visit speaking with the patient's daughter.  ASSESSMENT & PLAN:    1.  CAD status post DES to the RCA and DES to the LAD in 2015.  He was recently hospitalized with chest pain and demand ischemia in setting of high blood pressure.  Medications were adjusted, we will add Imdur 30 mg once in the evening as well.  No further ischemic testing planned at this time in the absence of worsening symptoms.  Continue aspirin, Norvasc, Aldactone, Coreg, Pravachol and lisinopril. Refill provided for sublingual nitroglycerin.  2.  Mixed hyperlipidemia, he continues on Pravachol.  Most recent LDL was 47.   Time:   Today, I have spent 7 minutes with the patient with telehealth technology discussing the above problems.     Medication Adjustments/Labs and Tests Ordered: Current medicines are reviewed at length with the patient today.  Concerns regarding medicines are outlined above.   Tests Ordered: No orders of the defined types were placed in this encounter.   Medication Changes: Meds ordered this encounter  Medications  . isosorbide mononitrate (IMDUR) 30 MG 24 hr tablet    Sig: Take 30 mg (1 tablet) every evening.    Dispense:  90 tablet    Refill:  3  . nitroGLYCERIN (NITROSTAT) 0.4 MG SL tablet    Sig: Place 1 tablet (0.4 mg total) under the tongue every 5 (five) minutes x 3 doses as needed  for chest pain.    Dispense:  25 tablet    Refill:  4    Follow Up:  In Person 6 months in the [place] office.  Signed, Rozann Lesches, MD  09/07/2019 8:51 AM    [place]

## 2019-09-07 ENCOUNTER — Encounter: Payer: Self-pay | Admitting: Cardiology

## 2019-09-07 ENCOUNTER — Telehealth (INDEPENDENT_AMBULATORY_CARE_PROVIDER_SITE_OTHER): Payer: Medicare Other | Admitting: Cardiology

## 2019-09-07 VITALS — BP 180/98 | HR 58 | Ht 72.0 in | Wt 260.0 lb

## 2019-09-07 DIAGNOSIS — E782 Mixed hyperlipidemia: Secondary | ICD-10-CM | POA: Diagnosis not present

## 2019-09-07 DIAGNOSIS — I25119 Atherosclerotic heart disease of native coronary artery with unspecified angina pectoris: Secondary | ICD-10-CM

## 2019-09-07 DIAGNOSIS — I10 Essential (primary) hypertension: Secondary | ICD-10-CM

## 2019-09-07 MED ORDER — NITROGLYCERIN 0.4 MG SL SUBL: 0.4000 mg | SUBLINGUAL_TABLET | SUBLINGUAL | 4 refills | Status: DC | PRN

## 2019-09-07 MED ORDER — ISOSORBIDE MONONITRATE ER 30 MG PO TB24: ORAL_TABLET | ORAL | 3 refills | Status: DC

## 2019-09-07 NOTE — Patient Instructions (Signed)
Medication Instructions:  START IMDUR 30 MG DAILY (IN THE EVENING)   Labwork: NONE  Testing/Procedures: NONE  Follow-Up: Your physician wants you to follow-up in: 6 MONTHS .  You will receive a reminder letter in the mail two months in advance. If you don't receive a letter, please call our office to schedule the follow-up appointment.   Any Other Special Instructions Will Be Listed Below (If Applicable).     If you need a refill on your cardiac medications before your next appointment, please call your pharmacy.

## 2019-09-08 ENCOUNTER — Telehealth: Payer: Self-pay | Admitting: Family Medicine

## 2019-09-08 NOTE — Telephone Encounter (Signed)
Please confirm with the his daughter Jeannene Patella I believe he is doing 5 injections/day?  Then I can finish the documentation thank you

## 2019-09-08 NOTE — Telephone Encounter (Signed)
Francisco Powell from [facility] needs a current office note that lists how many times a day the patient is testing and doing injections for insurance to pay for supplies.  She has note from last year but needs a more current one to include this info.  Phone [phone] Fax# [phone]

## 2019-09-11 NOTE — Telephone Encounter (Signed)
Script and office note will be faxed to number provided.

## 2019-09-11 NOTE — Telephone Encounter (Signed)
Please do a order for diabetic supplies syringes and needles for 5 shots per day Diagnosis type 2 diabetes dependent on insulin I will sign Then have front fax this order And also the office note from 08/22/2019 To the mentioned supplier/fax number thank you

## 2019-09-11 NOTE — Telephone Encounter (Signed)
Script written out and awaiting signature. Will have from fax office note and script to provided number

## 2019-09-11 NOTE — Telephone Encounter (Signed)
Prescription signed thank you °

## 2019-09-14 ENCOUNTER — Telehealth: Payer: Self-pay | Admitting: Family Medicine

## 2019-09-14 NOTE — Telephone Encounter (Signed)
FYI ONLY  [employer] rep called to let us know that his diabetic supplies had been approved for the year and we don't need to do anything again till next year.

## 2019-09-20 ENCOUNTER — Telehealth: Payer: Self-pay | Admitting: Family Medicine

## 2019-09-20 ENCOUNTER — Encounter: Payer: Self-pay | Admitting: Family Medicine

## 2019-09-20 NOTE — Telephone Encounter (Signed)
I dictated a letter please fax this with villiform that requested more information Fax to [place] [facility]

## 2019-09-20 NOTE — Telephone Encounter (Signed)
Faxed over information

## 2019-09-21 ENCOUNTER — Telehealth: Payer: Self-pay | Admitting: Family Medicine

## 2019-09-21 NOTE — Telephone Encounter (Signed)
Amber with [facility] is calling requesting an order for a dexcom G6 continuous meter. [facility] where we originally sent the order is giving pt and [employer] the run around.   They would like a new order sent to [facility]  Fax # [phone]  Daughter is also requesting an order for a new wheelchair.  CB# for [facility] once order is sent is [phone] ext [phone].

## 2019-09-21 NOTE — Telephone Encounter (Signed)
Orders were signed and faxed.  ?

## 2019-09-21 NOTE — Telephone Encounter (Signed)
Scripts are wrote out at nurse station if you agree with orders

## 2019-09-22 NOTE — Telephone Encounter (Signed)
Daughter pam was also notified and she wanted wheelchair to go to [place] and it was faxed there

## 2019-09-22 NOTE — Telephone Encounter (Signed)
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

## 2019-09-25 ENCOUNTER — Ambulatory Visit: Payer: Medicare Other | Admitting: Cardiology

## 2019-09-27 ENCOUNTER — Telehealth: Payer: Self-pay | Admitting: *Deleted

## 2019-09-27 NOTE — Telephone Encounter (Signed)
Await MD sign

## 2019-09-27 NOTE — Telephone Encounter (Signed)
Francisco Powell with [facility] is calling requesting an order for a dexcom G6 continuous meter. [facility] where we originally sent the order is giving pt and [employer] the run around.   They would like a new order sent to [facility]  Fax # [phone]

## 2019-09-27 NOTE — Telephone Encounter (Signed)
That would be fine please do so

## 2019-09-28 NOTE — Telephone Encounter (Signed)
Order faxed. Attempted to Higher education careers adviser at [facility] but no answer

## 2019-10-02 DIAGNOSIS — R531 Weakness: Secondary | ICD-10-CM | POA: Diagnosis not present

## 2019-10-02 DIAGNOSIS — Z7409 Other reduced mobility: Secondary | ICD-10-CM | POA: Diagnosis not present

## 2019-10-03 ENCOUNTER — Telehealth: Payer: Self-pay | Admitting: Family Medicine

## 2019-10-03 NOTE — Telephone Encounter (Signed)
[employer] called, stating that the OV note does not state pt's testing schedule or sliding scale of insulin  Can we do an addendum to the 08/22/2019 OV note that the patient tests 4 times daily & add sliding scale info? (this is to help get pt the Dexacom G6)   We then need to fax that [place] note to [facility] Fx#  [phone]

## 2019-10-04 NOTE — Telephone Encounter (Signed)
Addendum was placed as requested

## 2019-10-06 NOTE — Telephone Encounter (Signed)
[employer] calling to confirm updated OV note have been faced to [facility]

## 2019-10-09 ENCOUNTER — Encounter: Payer: Self-pay | Admitting: Family Medicine

## 2019-10-10 NOTE — Telephone Encounter (Signed)
Nurses I would recommend his long-acting insulin go up Recommend Levemir 58 units in the morning 62  in the evening Please also forward below message via MyChart to the daughter  Pam I agree with you these numbers are high.  Next step would be increasing the Levemir as you are aware this medication is a 24-hour medication Therefore utilize 58 units in the morning 62  in the evening  Also I am open to trying Ozempic or Trulicity or Victoza all of these medications work in a similar fashion.  I would recommend you check with his insurance to see if this medication is affordable to him Certainly with these medications for those who are on Medicare-when they hit the donut hole these are very expensive  Let me know what you find out and if it is affordable we can add 1 of these to his regimen which would help reduce how much insulin he is taking  Thanks-Dr. Nicki Reaper

## 2019-10-10 NOTE — Telephone Encounter (Signed)
Faxed OV note.  

## 2019-10-11 ENCOUNTER — Other Ambulatory Visit: Payer: Self-pay | Admitting: *Deleted

## 2019-10-11 ENCOUNTER — Encounter: Payer: Self-pay | Admitting: Family Medicine

## 2019-10-11 MED ORDER — INSULIN DETEMIR 100 UNIT/ML ~~LOC~~ SOLN: SUBCUTANEOUS | 0 refills | Status: DC

## 2019-10-13 ENCOUNTER — Other Ambulatory Visit: Payer: Self-pay | Admitting: Family Medicine

## 2019-10-13 ENCOUNTER — Other Ambulatory Visit: Payer: Self-pay | Admitting: *Deleted

## 2019-10-13 DIAGNOSIS — I10 Essential (primary) hypertension: Secondary | ICD-10-CM

## 2019-10-13 DIAGNOSIS — E1165 Type 2 diabetes mellitus with hyperglycemia: Secondary | ICD-10-CM

## 2019-10-13 MED ORDER — OZEMPIC (0.25 OR 0.5 MG/DOSE) 2 MG/1.5ML ~~LOC~~ SOPN: 0.2500 mg | PEN_INJECTOR | SUBCUTANEOUS | 3 refills | Status: DC

## 2019-10-13 NOTE — Telephone Encounter (Signed)
Ozempic 0.25 mg subcutaneous once weekly for the first 4 weeks then if well tolerated may go up to 0.5 mg subcutaneous weekly  Nurses Please notify family of this Please send in prescription It is best to start off with 0.25 mg subcutaneous once weekly, send this prescription in with 3 refills then family will give [place] feedback after 4 to 6 weeks so we can decide regarding bumping up the dose Family can give [place] feedback how glucose readings are doing May need to gradually cut back on long-acting insulin if glucose readings are becoming low  I recommend [id number], metabolic 7 in June with follow-up office visit sooner if any problems

## 2019-10-16 NOTE — Telephone Encounter (Signed)
6 months on meds 

## 2019-10-16 NOTE — Telephone Encounter (Signed)
Visit 08/22/19

## 2019-10-19 ENCOUNTER — Telehealth: Payer: Self-pay | Admitting: Family Medicine

## 2019-10-19 NOTE — Telephone Encounter (Signed)
Thank you Nothing like Duplicate form sent from the same place-will shred that one

## 2019-10-19 NOTE — Telephone Encounter (Signed)
His prescription for wheelchair needs his height weight please put this on there please redo the prescription please see the form thank you  I will sign then this can be faxed

## 2019-10-19 NOTE — Telephone Encounter (Signed)
This was from several weeks ago --patient has had wheelchair for a few weeks.

## 2019-10-25 ENCOUNTER — Encounter: Payer: Self-pay | Admitting: Family Medicine

## 2019-10-27 ENCOUNTER — Telehealth: Payer: Self-pay | Admitting: Family Medicine

## 2019-10-27 NOTE — Telephone Encounter (Signed)
Maiama NP with [employer] calling to report on 5/12 she saw pt and hemiglobin A1C was 9. And they gave him a screening kit for colon cancer.

## 2019-10-27 NOTE — Telephone Encounter (Signed)
See my chart message concerning meds

## 2019-11-01 DIAGNOSIS — R531 Weakness: Secondary | ICD-10-CM | POA: Diagnosis not present

## 2019-11-01 DIAGNOSIS — Z7409 Other reduced mobility: Secondary | ICD-10-CM | POA: Diagnosis not present

## 2019-11-03 NOTE — Telephone Encounter (Signed)
Thank you for the update We are hoping the Ozempic will help

## 2019-11-07 DIAGNOSIS — E1165 Type 2 diabetes mellitus with hyperglycemia: Secondary | ICD-10-CM | POA: Diagnosis not present

## 2019-11-07 DIAGNOSIS — Z794 Long term (current) use of insulin: Secondary | ICD-10-CM | POA: Diagnosis not present

## 2019-11-13 ENCOUNTER — Other Ambulatory Visit: Payer: Self-pay | Admitting: Family Medicine

## 2019-11-15 ENCOUNTER — Encounter: Payer: Self-pay | Admitting: Family Medicine

## 2019-11-16 ENCOUNTER — Other Ambulatory Visit: Payer: Self-pay | Admitting: *Deleted

## 2019-11-16 ENCOUNTER — Encounter: Payer: Self-pay | Admitting: Family Medicine

## 2019-11-16 MED ORDER — COLCHICINE 0.6 MG PO TABS: ORAL_TABLET | ORAL | 1 refills | Status: AC

## 2019-11-16 MED ORDER — PREDNISONE 20 MG PO TABS: 20.0000 mg | ORAL_TABLET | Freq: Two times a day (BID) | ORAL | 0 refills | Status: DC

## 2019-11-16 NOTE — Telephone Encounter (Signed)
Nurses  Please send in colchicine 0.6 mg, take 1 twice daily for relief of gout, #24, 1 refill Also prednisone 20 mg 2 daily for 5 days Also send in the following message to Thayer Jew and family  Hi Pam  Allopurinol is a good medication to minimize production of uric acid but does not do much to treat the active attack  Colchicine does do a good job taken twice daily until the situation is better, may cause some diarrhea  Also a short course of prednisone for 5 days can help but he will need to be careful with starches because it will cause her sugars to go up temporarily  Finally avoid excessive nuts, seafood such as shrimp, processed meats, and beans because these can increase the risk of gout  If any ongoing trouble let us know  Thanks-Dr. Nicki Reaper

## 2019-11-27 ENCOUNTER — Encounter: Payer: Self-pay | Admitting: Family Medicine

## 2019-12-01 ENCOUNTER — Encounter: Payer: Self-pay | Admitting: Family Medicine

## 2019-12-01 DIAGNOSIS — E1140 Type 2 diabetes mellitus with diabetic neuropathy, unspecified: Secondary | ICD-10-CM

## 2019-12-01 DIAGNOSIS — E1165 Type 2 diabetes mellitus with hyperglycemia: Secondary | ICD-10-CM

## 2019-12-01 DIAGNOSIS — Z79899 Other long term (current) drug therapy: Secondary | ICD-10-CM

## 2019-12-01 DIAGNOSIS — I10 Essential (primary) hypertension: Secondary | ICD-10-CM

## 2019-12-01 NOTE — Telephone Encounter (Signed)
Nurses A1c, lipid, met 7, liver, CBC Please fax it to the attached form/fax number thanks

## 2019-12-01 NOTE — Addendum Note (Signed)
Addended by: Vicente Males on: 12/01/2019 04:37 PM   Modules accepted: Orders

## 2019-12-02 DIAGNOSIS — Z7409 Other reduced mobility: Secondary | ICD-10-CM | POA: Diagnosis not present

## 2019-12-02 DIAGNOSIS — R531 Weakness: Secondary | ICD-10-CM | POA: Diagnosis not present

## 2019-12-06 ENCOUNTER — Other Ambulatory Visit: Payer: Self-pay | Admitting: Family Medicine

## 2019-12-06 DIAGNOSIS — Z794 Long term (current) use of insulin: Secondary | ICD-10-CM | POA: Diagnosis not present

## 2019-12-06 DIAGNOSIS — E1165 Type 2 diabetes mellitus with hyperglycemia: Secondary | ICD-10-CM | POA: Diagnosis not present

## 2019-12-06 DIAGNOSIS — I10 Essential (primary) hypertension: Secondary | ICD-10-CM | POA: Diagnosis not present

## 2019-12-06 DIAGNOSIS — Z79899 Other long term (current) drug therapy: Secondary | ICD-10-CM | POA: Diagnosis not present

## 2019-12-06 DIAGNOSIS — E1140 Type 2 diabetes mellitus with diabetic neuropathy, unspecified: Secondary | ICD-10-CM | POA: Diagnosis not present

## 2019-12-07 LAB — HEPATIC FUNCTION PANEL
ALT: 17 IU/L (ref 0–44)
AST: 15 IU/L (ref 0–40)
Albumin: 4.3 g/dL (ref 3.7–4.7)
Alkaline Phosphatase: 74 IU/L (ref 48–121)
Bilirubin Total: 0.2 mg/dL (ref 0.0–1.2)
Bilirubin, Direct: 0.09 mg/dL (ref 0.00–0.40)
Total Protein: 6.8 g/dL (ref 6.0–8.5)

## 2019-12-07 LAB — CBC WITH DIFFERENTIAL/PLATELET
Basophils Absolute: 0.1 10*3/uL (ref 0.0–0.2)
Basos: 1 %
EOS (ABSOLUTE): 0.3 10*3/uL (ref 0.0–0.4)
Eos: 3 %
Hematocrit: 42.7 % (ref 37.5–51.0)
Hemoglobin: 13.4 g/dL (ref 13.0–17.7)
Immature Grans (Abs): 0.1 10*3/uL (ref 0.0–0.1)
Immature Granulocytes: 1 %
Lymphocytes Absolute: 2.6 10*3/uL (ref 0.7–3.1)
Lymphs: 28 %
MCH: 28.4 pg (ref 26.6–33.0)
MCHC: 31.4 g/dL — ABNORMAL LOW (ref 31.5–35.7)
MCV: 91 fL (ref 79–97)
Monocytes Absolute: 1.1 10*3/uL — ABNORMAL HIGH (ref 0.1–0.9)
Monocytes: 12 %
Neutrophils Absolute: 5.2 10*3/uL (ref 1.4–7.0)
Neutrophils: 55 %
Platelets: 199 10*3/uL (ref 150–450)
RBC: 4.72 x10E6/uL (ref 4.14–5.80)
RDW: 15.6 % — ABNORMAL HIGH (ref 11.6–15.4)
WBC: 9.2 10*3/uL (ref 3.4–10.8)

## 2019-12-07 LAB — BASIC METABOLIC PANEL
BUN/Creatinine Ratio: 20 (ref 10–24)
BUN: 21 mg/dL (ref 8–27)
CO2: 23 mmol/L (ref 20–29)
Calcium: 9.7 mg/dL (ref 8.6–10.2)
Chloride: 103 mmol/L (ref 96–106)
Creatinine, Ser: 1.03 mg/dL (ref 0.76–1.27)
GFR calc Af Amer: 81 mL/min/{1.73_m2} (ref >59)
GFR calc non Af Amer: 70 mL/min/{1.73_m2} (ref >59)
Glucose: 175 mg/dL — ABNORMAL HIGH (ref 65–99)
Potassium: 5 mmol/L (ref 3.5–5.2)
Sodium: 144 mmol/L (ref 134–144)

## 2019-12-07 LAB — LIPID PANEL
Chol/HDL Ratio: 3.7 ratio (ref 0.0–5.0)
Cholesterol, Total: 126 mg/dL (ref 100–199)
HDL: 34 mg/dL — ABNORMAL LOW (ref >39)
LDL Chol Calc (NIH): 38 mg/dL (ref 0–99)
Triglycerides: 365 mg/dL — ABNORMAL HIGH (ref 0–149)
VLDL Cholesterol Cal: 54 mg/dL — ABNORMAL HIGH (ref 5–40)

## 2019-12-07 LAB — HEMOGLOBIN A1C
Est. average glucose Bld gHb Est-mCnc: 180 mg/dL
Hgb A1c MFr Bld: 7.9 % — ABNORMAL HIGH (ref 4.8–5.6)

## 2019-12-08 DIAGNOSIS — E1165 Type 2 diabetes mellitus with hyperglycemia: Secondary | ICD-10-CM | POA: Diagnosis not present

## 2019-12-15 ENCOUNTER — Other Ambulatory Visit: Payer: Self-pay | Admitting: Family Medicine

## 2019-12-19 NOTE — Telephone Encounter (Signed)
90-day with 1 refill 

## 2019-12-19 NOTE — Telephone Encounter (Signed)
Last visit 08/22/19 and recent labs done on 12/06/19

## 2020-01-01 ENCOUNTER — Encounter: Payer: Self-pay | Admitting: Family Medicine

## 2020-01-01 ENCOUNTER — Other Ambulatory Visit: Payer: Self-pay

## 2020-01-01 ENCOUNTER — Ambulatory Visit (INDEPENDENT_AMBULATORY_CARE_PROVIDER_SITE_OTHER): Payer: Medicare Other | Admitting: Family Medicine

## 2020-01-01 VITALS — BP 128/66 | Temp 97.2°F | Wt 238.0 lb

## 2020-01-01 DIAGNOSIS — E782 Mixed hyperlipidemia: Secondary | ICD-10-CM | POA: Diagnosis not present

## 2020-01-01 DIAGNOSIS — E1142 Type 2 diabetes mellitus with diabetic polyneuropathy: Secondary | ICD-10-CM | POA: Diagnosis not present

## 2020-01-01 DIAGNOSIS — E1165 Type 2 diabetes mellitus with hyperglycemia: Secondary | ICD-10-CM

## 2020-01-01 DIAGNOSIS — Z79899 Other long term (current) drug therapy: Secondary | ICD-10-CM | POA: Diagnosis not present

## 2020-01-01 DIAGNOSIS — Z7409 Other reduced mobility: Secondary | ICD-10-CM | POA: Diagnosis not present

## 2020-01-01 DIAGNOSIS — I10 Essential (primary) hypertension: Secondary | ICD-10-CM

## 2020-01-01 DIAGNOSIS — R531 Weakness: Secondary | ICD-10-CM | POA: Diagnosis not present

## 2020-01-01 MED ORDER — CARVEDILOL 25 MG PO TABS: ORAL_TABLET | ORAL | 1 refills | Status: DC

## 2020-01-01 MED ORDER — PRAVASTATIN SODIUM 80 MG PO TABS: ORAL_TABLET | ORAL | 1 refills | Status: DC

## 2020-01-01 MED ORDER — GABAPENTIN 300 MG PO CAPS: ORAL_CAPSULE | ORAL | 1 refills | Status: DC

## 2020-01-01 MED ORDER — DULOXETINE HCL 60 MG PO CPEP: 60.0000 mg | ORAL_CAPSULE | Freq: Every day | ORAL | 1 refills | Status: DC

## 2020-01-01 MED ORDER — ALPRAZOLAM 1 MG PO TABS: 0.5000 mg | ORAL_TABLET | Freq: Every evening | ORAL | 1 refills | Status: DC | PRN

## 2020-01-01 MED ORDER — LISINOPRIL 40 MG PO TABS: ORAL_TABLET | ORAL | 1 refills | Status: DC

## 2020-01-01 MED ORDER — METFORMIN HCL 500 MG PO TABS: ORAL_TABLET | ORAL | 1 refills | Status: DC

## 2020-01-01 MED ORDER — OZEMPIC (1 MG/DOSE) 2 MG/1.5ML ~~LOC~~ SOPN: PEN_INJECTOR | SUBCUTANEOUS | 5 refills | Status: DC

## 2020-01-01 MED ORDER — AMLODIPINE BESYLATE 10 MG PO TABS: ORAL_TABLET | ORAL | 1 refills | Status: DC

## 2020-01-01 NOTE — Progress Notes (Signed)
Subjective:    Patient ID: Francisco Powell, male    DOB: [birth year], 79 y.o.   MRN: [id number]  HPI Pt here for follow up. Pt here with daughter Pam. Pt sugars have been pretty good. Today it is 116. Open area on left side that daughter has been working with. Not draining. Area was closed but has opened again. No issues or concerns today.  Needs refill on all med except insulin.  Patient's glucose has been challenging to keep under decent control.  Ozempic is helping. Taking blood pressure and heart medicine on a regular basis denies any heart failure symptoms currently Certainly taking Xanax at nighttime to help with rest family is helping out the patient patient is having some mild weakness difficult time standing and getting around no falls Results for orders placed or performed in visit on 12/06/19  CBC with Differential/Platelet  Result Value Ref Range   WBC 9.2 3.4 - 10.8 x10E3/uL   RBC 4.72 4.14 - 5.80 x10E6/uL   Hemoglobin 13.4 13.0 - 17.7 g/dL   Hematocrit 42.7 37.5 - 51.0 %   MCV 91 79 - 97 fL   MCH 28.4 26.6 - 33.0 pg   MCHC 31.4 (L) 31 - 35 g/dL   RDW 15.6 (H) 11.6 - 15.4 %   Platelets 199 150 - 450 x10E3/uL   Neutrophils 55 Not Estab. %   Lymphs 28 Not Estab. %   Monocytes 12 Not Estab. %   Eos 3 Not Estab. %   Basos 1 Not Estab. %   Neutrophils Absolute 5.2 1 - 7 x10E3/uL   Lymphocytes Absolute 2.6 0 - 3 x10E3/uL   Monocytes Absolute 1.1 (H) 0 - 0 x10E3/uL   EOS (ABSOLUTE) 0.3 0.0 - 0.4 x10E3/uL   Basophils Absolute 0.1 0 - 0 x10E3/uL   Immature Granulocytes 1 Not Estab. %   Immature Grans (Abs) 0.1 0.0 - 0.1 V61Y0/VP  Basic metabolic panel  Result Value Ref Range   Glucose 175 (H) 65 - 99 mg/dL   BUN 21 8 - 27 mg/dL   Creatinine, Ser 1.03 0.76 - 1.27 mg/dL   GFR calc non Af Amer 70 >59 mL/min/1.73   GFR calc Af Amer 81 >59 mL/min/1.73   BUN/Creatinine Ratio 20 10 - 24   Sodium 144 134 - 144 mmol/L   Potassium 5.0 3.5 - 5.2 mmol/L   Chloride 103 96 - 106  mmol/L   CO2 23 20 - 29 mmol/L   Calcium 9.7 8.6 - 10.2 mg/dL  Lipid panel  Result Value Ref Range   Cholesterol, Total 126 100 - 199 mg/dL   Triglycerides 365 (H) 0 - 149 mg/dL   HDL 34 (L) >39 mg/dL   VLDL Cholesterol Cal 54 (H) 5 - 40 mg/dL   LDL Chol Calc (NIH) 38 0 - 99 mg/dL   Chol/HDL Ratio 3.7 0.0 - 5.0 ratio  Hepatic function panel  Result Value Ref Range   Total Protein 6.8 6.0 - 8.5 g/dL   Albumin 4.3 3.7 - 4.7 g/dL   Bilirubin Total 0.2 0.0 - 1.2 mg/dL   Bilirubin, Direct 0.09 0.00 - 0.40 mg/dL   Alkaline Phosphatase 74 48 - 121 IU/L   AST 15 0 - 40 IU/L   ALT 17 0 - 44 IU/L  Hemoglobin A1c  Result Value Ref Range   Hgb A1c MFr Bld 7.9 (H) 4.8 - 5.6 %   Est. average glucose Bld gHb Est-mCnc 180 mg/dL   Fall Risk  08/22/2019 04/11/2019 01/12/2019 08/15/2015 06/01/2014  Falls in the past year? 0 0 (No Data) No No  Comment - - Emmi Telephone Survey: data to providers prior to load - -  Number falls in past yr: - - (No Data) - -  Comment - - Emmi Telephone Survey Actual Response =  - -  Risk for fall due to : Impaired balance/gait;Impaired mobility Impaired balance/gait;Impaired mobility - - -  Follow up Falls evaluation completed Falls evaluation completed - - -    Review of Systems    Please see above Objective:   Physical Exam Lungs clear respiratory rate normal heart regular pulse normal BP good extremities no edema skin warm dry       Assessment & Plan:  1. Uncontrolled type 2 diabetes mellitus with hyperglycemia (HCC) We are increasing Ozempic to 1 mg weekly hopefully this will get readings under better control   - Hemoglobin O0A - Basic Metabolic Panel (BMET)  2. Essential hypertension Blood pressure good control currently today continue current measures - Hemoglobin Y0K - Basic Metabolic Panel (BMET)  3. High risk medication use Lab work ordered await the results - Hemoglobin H9X - Basic Metabolic Panel (BMET)  4. Diabetic peripheral neuropathy  (HCC) Increase gabapentin to get better results if causes drowsiness to notify us  5. Mixed hyperlipidemia Continue current measures labs look good  6. Morbid obesity ([place]) Watch portions stay active try to lose weight  Follow-up with labs and follow-up office visit in 4 5 months

## 2020-01-08 DIAGNOSIS — E1165 Type 2 diabetes mellitus with hyperglycemia: Secondary | ICD-10-CM | POA: Diagnosis not present

## 2020-01-11 ENCOUNTER — Other Ambulatory Visit: Payer: Self-pay | Admitting: Family Medicine

## 2020-02-01 DIAGNOSIS — R531 Weakness: Secondary | ICD-10-CM | POA: Diagnosis not present

## 2020-02-01 DIAGNOSIS — Z7409 Other reduced mobility: Secondary | ICD-10-CM | POA: Diagnosis not present

## 2020-02-05 DIAGNOSIS — E1165 Type 2 diabetes mellitus with hyperglycemia: Secondary | ICD-10-CM | POA: Diagnosis not present

## 2020-02-05 DIAGNOSIS — Z794 Long term (current) use of insulin: Secondary | ICD-10-CM | POA: Diagnosis not present

## 2020-02-09 ENCOUNTER — Encounter: Payer: Self-pay | Admitting: Family Medicine

## 2020-02-09 MED ORDER — INSULIN LISPRO 100 UNIT/ML ~~LOC~~ SOLN: SUBCUTANEOUS | 5 refills | Status: DC

## 2020-03-03 DIAGNOSIS — Z7409 Other reduced mobility: Secondary | ICD-10-CM | POA: Diagnosis not present

## 2020-03-03 DIAGNOSIS — R531 Weakness: Secondary | ICD-10-CM | POA: Diagnosis not present

## 2020-04-02 ENCOUNTER — Telehealth: Payer: Self-pay | Admitting: Family Medicine

## 2020-04-02 ENCOUNTER — Encounter: Payer: Self-pay | Admitting: Family Medicine

## 2020-04-02 DIAGNOSIS — R531 Weakness: Secondary | ICD-10-CM | POA: Diagnosis not present

## 2020-04-02 DIAGNOSIS — Z7409 Other reduced mobility: Secondary | ICD-10-CM | POA: Diagnosis not present

## 2020-04-02 NOTE — Telephone Encounter (Signed)
Nurses Please go ahead and printout metabolic 7 AM D7O and fax it to the number stated regarding [name] RN  Also forward the following message   Dear Jeannene Patella  It would be fine to do the labs through the way you are suggesting as long as I get a copy of them sent here.  Hopefully we will be able to see Thayer Jew here but if he is unable to come we can do a video visit.  And if issues come up that would need a home visit I can even arrange that. Sorry to hear that he is having more difficulties.  I know you are trying to do your best for him.  Look forward to talking soon TakeCare-Dr. Nicki Reaper

## 2020-04-02 NOTE — Telephone Encounter (Signed)
Left message on Pam voicemail that lab orders did not go through. See My chart message. Tried to fax lab orders to pt daughter work but unsuccessful.

## 2020-04-09 DIAGNOSIS — I10 Essential (primary) hypertension: Secondary | ICD-10-CM | POA: Diagnosis not present

## 2020-04-09 DIAGNOSIS — Z79899 Other long term (current) drug therapy: Secondary | ICD-10-CM | POA: Diagnosis not present

## 2020-04-09 DIAGNOSIS — E1165 Type 2 diabetes mellitus with hyperglycemia: Secondary | ICD-10-CM | POA: Diagnosis not present

## 2020-04-10 LAB — BASIC METABOLIC PANEL
BUN/Creatinine Ratio: 24 (ref 10–24)
BUN: 27 mg/dL (ref 8–27)
CO2: 21 mmol/L (ref 20–29)
Calcium: 9 mg/dL (ref 8.6–10.2)
Chloride: 106 mmol/L (ref 96–106)
Creatinine, Ser: 1.11 mg/dL (ref 0.76–1.27)
GFR calc Af Amer: 74 mL/min/{1.73_m2} (ref >59)
GFR calc non Af Amer: 64 mL/min/{1.73_m2} (ref >59)
Glucose: 182 mg/dL — ABNORMAL HIGH (ref 65–99)
Potassium: 5.1 mmol/L (ref 3.5–5.2)
Sodium: 142 mmol/L (ref 134–144)

## 2020-04-10 LAB — HEMOGLOBIN A1C
Est. average glucose Bld gHb Est-mCnc: 163 mg/dL
Hgb A1c MFr Bld: 7.3 % — ABNORMAL HIGH (ref 4.8–5.6)

## 2020-04-15 ENCOUNTER — Encounter: Payer: Self-pay | Admitting: Internal Medicine

## 2020-04-18 ENCOUNTER — Other Ambulatory Visit: Payer: Self-pay | Admitting: Family Medicine

## 2020-04-18 NOTE — Telephone Encounter (Signed)
May have 6 months ?

## 2020-05-02 ENCOUNTER — Encounter: Payer: Self-pay | Admitting: Family Medicine

## 2020-05-03 ENCOUNTER — Telehealth (INDEPENDENT_AMBULATORY_CARE_PROVIDER_SITE_OTHER): Payer: Medicare Other | Admitting: Family Medicine

## 2020-05-03 ENCOUNTER — Other Ambulatory Visit: Payer: Self-pay

## 2020-05-03 DIAGNOSIS — E1165 Type 2 diabetes mellitus with hyperglycemia: Secondary | ICD-10-CM | POA: Diagnosis not present

## 2020-05-03 DIAGNOSIS — E1142 Type 2 diabetes mellitus with diabetic polyneuropathy: Secondary | ICD-10-CM | POA: Diagnosis not present

## 2020-05-03 DIAGNOSIS — R531 Weakness: Secondary | ICD-10-CM | POA: Diagnosis not present

## 2020-05-03 DIAGNOSIS — Z7409 Other reduced mobility: Secondary | ICD-10-CM | POA: Diagnosis not present

## 2020-05-03 MED ORDER — GABAPENTIN 300 MG PO CAPS: ORAL_CAPSULE | ORAL | 1 refills | Status: AC

## 2020-05-03 NOTE — Progress Notes (Signed)
Subjective:    Patient ID: Francisco Powell, male    DOB: 1940-11-08, 79 y.o.   MRN: [id number]  HPIpt is with daughter Jeannene Patella.   Concerns about constipation. Tried miralax, and metamucil.  We talked about various ways of improving fiber in the diet as well as fluids.  They will try MiraLAX on a regular basis along with stool softener if that does not work we will consider a medication such as Linzess  Has been complaining with bilateral leg pain. Gabapentin has helped some.  We will titrate up the gabapentin to see if that helps.  Trying to stay away from narcotics.  Patient becoming more chair and bed ridden because of deconditioning and deterioration of his overall health  Daughter states pt's lisinopril is 40mg  one daily but med list has one half daily.  Patient relates taking medication on a regular basis lab work recently done  Would like to pick up a flu vaccine for daughter to administer.   Virtual Visit via Video Note  I connected with [name] on 05/03/20 at  2:00 PM EST by a video enabled telemedicine application and verified that I am speaking with the correct person using two identifiers.  Location: Patient: home Provider: office   I discussed the limitations of evaluation and management by telemedicine and the availability of in person appointments. The patient expressed understanding and agreed to proceed.  History of Present Illness:    Observations/Objective:   Assessment and Plan:   Follow Up Instructions:    I discussed the assessment and treatment plan with the patient. The patient was provided an opportunity to ask questions and all were answered. The patient agreed with the plan and demonstrated an understanding of the instructions.   The patient was advised to call back or seek an in-person evaluation if the symptoms worsen or if the condition fails to improve as anticipated.  I provided 20 minutes of non-face-to-face time during this  encounter.       Review of Systems  Constitutional: Positive for fatigue. Negative for diaphoresis.  HENT: Negative for congestion and rhinorrhea.   Respiratory: Negative for cough and shortness of breath.   Cardiovascular: Negative for chest pain and leg swelling.  Gastrointestinal: Negative for abdominal pain and diarrhea.  Musculoskeletal: Positive for arthralgias and back pain.  Skin: Negative for color change and rash.  Neurological: Negative for dizziness and headaches.  Psychiatric/Behavioral: Negative for behavioral problems and confusion.   Results for orders placed or performed in visit on 01/01/20  Hemoglobin A1c  Result Value Ref Range   Hgb A1c MFr Bld 7.3 (H) 4.8 - 5.6 %   Est. average glucose Bld gHb Est-mCnc 163 mg/dL  Basic Metabolic Panel (BMET)  Result Value Ref Range   Glucose 182 (H) 65 - 99 mg/dL   BUN 27 8 - 27 mg/dL   Creatinine, Ser 1.11 0.76 - 1.27 mg/dL   GFR calc non Af Amer 64 >59 mL/min/1.73   GFR calc Af Amer 74 >59 mL/min/1.73   BUN/Creatinine Ratio 24 10 - 24   Sodium 142 134 - 144 mmol/L   Potassium 5.1 3.5 - 5.2 mmol/L   Chloride 106 96 - 106 mmol/L   CO2 21 20 - 29 mmol/L   Calcium 9.0 8.6 - 10.2 mg/dL        Objective:   Physical Exam   Patient had virtual visit-video Appears to be in no distress Atraumatic Neuro able to relate and oriented No apparent  resp distress Color normal Patient appears to be weak at times falls asleep during the visit     Assessment & Plan:  1. Uncontrolled type 2 diabetes mellitus with hyperglycemia ([place]) I do better job watching diet and taking medication.  A1c improved at 7.3  2. Diabetic peripheral neuropathy (HCC) Bump up the gabapentin to see if this will help.  3. Morbid obesity ([place]) Watch portions stay physically active as condition allows Flu shot daughter will administer she is a Marine scientist

## 2020-05-05 MED ORDER — LISINOPRIL 40 MG PO TABS
ORAL_TABLET | ORAL | 1 refills | Status: DC
Start: 2020-05-05 — End: 2020-06-15

## 2020-05-06 DIAGNOSIS — E1165 Type 2 diabetes mellitus with hyperglycemia: Secondary | ICD-10-CM | POA: Diagnosis not present

## 2020-05-08 NOTE — Progress Notes (Signed)
05/08/20- High dose Flu shot placed in as historical

## 2020-05-16 ENCOUNTER — Other Ambulatory Visit: Payer: Self-pay | Admitting: Family Medicine

## 2020-05-17 ENCOUNTER — Encounter: Payer: Self-pay | Admitting: Family Medicine

## 2020-05-17 NOTE — Telephone Encounter (Signed)
Nurses This patient is homebound His daughter is a Marine scientist I am perfectly fine with him getting a senior dose flu shot for the daughter to administer to him then she can pick this up on 05/23/2020 if there is a problem with this please let me know

## 2020-05-27 ENCOUNTER — Encounter: Payer: Self-pay | Admitting: Family Medicine

## 2020-05-27 ENCOUNTER — Other Ambulatory Visit: Payer: Self-pay | Admitting: *Deleted

## 2020-05-27 DIAGNOSIS — R8290 Unspecified abnormal findings in urine: Secondary | ICD-10-CM

## 2020-05-27 NOTE — Telephone Encounter (Signed)
Nurses Please work with Francisco Powell's daughter It would be fine to order UA and urine culture as long as I get the results  Also if his situation gets to the point where he cannot come to the office Francisco Powell can work with [place] for me to do a home visit for his next checkup

## 2020-05-27 NOTE — Progress Notes (Signed)
urin

## 2020-05-28 DIAGNOSIS — R8290 Unspecified abnormal findings in urine: Secondary | ICD-10-CM | POA: Diagnosis not present

## 2020-05-29 LAB — URINALYSIS
Bilirubin, UA: NEGATIVE
Glucose, UA: NEGATIVE
Ketones, UA: NEGATIVE
Leukocytes,UA: NEGATIVE
Nitrite, UA: NEGATIVE
RBC, UA: NEGATIVE
Specific Gravity, UA: 1.017 (ref 1.005–1.030)
Urobilinogen, Ur: 0.2 mg/dL (ref 0.2–1.0)
pH, UA: 5.5 (ref 5.0–7.5)

## 2020-05-31 ENCOUNTER — Other Ambulatory Visit: Payer: Self-pay | Admitting: *Deleted

## 2020-05-31 MED ORDER — SULFAMETHOXAZOLE-TRIMETHOPRIM 800-160 MG PO TABS: 1.0000 | ORAL_TABLET | Freq: Two times a day (BID) | ORAL | 0 refills | Status: DC

## 2020-06-02 DIAGNOSIS — Z7409 Other reduced mobility: Secondary | ICD-10-CM | POA: Diagnosis not present

## 2020-06-02 DIAGNOSIS — R531 Weakness: Secondary | ICD-10-CM | POA: Diagnosis not present

## 2020-06-03 LAB — URINE CULTURE

## 2020-06-06 DIAGNOSIS — E1165 Type 2 diabetes mellitus with hyperglycemia: Secondary | ICD-10-CM | POA: Diagnosis not present

## 2020-06-10 ENCOUNTER — Encounter: Payer: Self-pay | Admitting: Family Medicine

## 2020-06-11 ENCOUNTER — Other Ambulatory Visit: Payer: Self-pay

## 2020-06-11 ENCOUNTER — Encounter (HOSPITAL_COMMUNITY): Payer: Self-pay | Admitting: Emergency Medicine

## 2020-06-11 ENCOUNTER — Inpatient Hospital Stay (HOSPITAL_COMMUNITY)
Admission: EM | Admit: 2020-06-11 | Discharge: 2020-06-15 | DRG: 177 | Disposition: A | Discharge status: Home / Self Care | Payer: Medicare Other | Attending: Internal Medicine | Admitting: Internal Medicine

## 2020-06-11 ENCOUNTER — Emergency Department (HOSPITAL_COMMUNITY): Payer: Medicare Other

## 2020-06-11 DIAGNOSIS — U071 COVID-19: Secondary | ICD-10-CM | POA: Diagnosis not present

## 2020-06-11 DIAGNOSIS — Z955 Presence of coronary angioplasty implant and graft: Secondary | ICD-10-CM

## 2020-06-11 DIAGNOSIS — N179 Acute kidney failure, unspecified: Secondary | ICD-10-CM | POA: Diagnosis not present

## 2020-06-11 DIAGNOSIS — F32A Depression, unspecified: Secondary | ICD-10-CM | POA: Diagnosis present

## 2020-06-11 DIAGNOSIS — L89302 Pressure ulcer of unspecified buttock, stage 2: Secondary | ICD-10-CM | POA: Diagnosis not present

## 2020-06-11 DIAGNOSIS — L89312 Pressure ulcer of right buttock, stage 2: Secondary | ICD-10-CM | POA: Diagnosis not present

## 2020-06-11 DIAGNOSIS — E875 Hyperkalemia: Secondary | ICD-10-CM | POA: Diagnosis not present

## 2020-06-11 DIAGNOSIS — M5441 Lumbago with sciatica, right side: Secondary | ICD-10-CM | POA: Diagnosis not present

## 2020-06-11 DIAGNOSIS — E1165 Type 2 diabetes mellitus with hyperglycemia: Secondary | ICD-10-CM | POA: Diagnosis not present

## 2020-06-11 DIAGNOSIS — M545 Low back pain, unspecified: Secondary | ICD-10-CM | POA: Diagnosis present

## 2020-06-11 DIAGNOSIS — J1282 Pneumonia due to coronavirus disease 2019: Secondary | ICD-10-CM | POA: Diagnosis not present

## 2020-06-11 DIAGNOSIS — I252 Old myocardial infarction: Secondary | ICD-10-CM

## 2020-06-11 DIAGNOSIS — G8929 Other chronic pain: Secondary | ICD-10-CM | POA: Diagnosis not present

## 2020-06-11 DIAGNOSIS — J9601 Acute respiratory failure with hypoxia: Secondary | ICD-10-CM | POA: Diagnosis present

## 2020-06-11 DIAGNOSIS — Z743 Need for continuous supervision: Secondary | ICD-10-CM | POA: Diagnosis not present

## 2020-06-11 DIAGNOSIS — I10 Essential (primary) hypertension: Secondary | ICD-10-CM | POA: Diagnosis present

## 2020-06-11 DIAGNOSIS — Z7984 Long term (current) use of oral hypoglycemic drugs: Secondary | ICD-10-CM

## 2020-06-11 DIAGNOSIS — Z794 Long term (current) use of insulin: Secondary | ICD-10-CM | POA: Diagnosis not present

## 2020-06-11 DIAGNOSIS — Z880 Allergy status to penicillin: Secondary | ICD-10-CM

## 2020-06-11 DIAGNOSIS — Z981 Arthrodesis status: Secondary | ICD-10-CM

## 2020-06-11 DIAGNOSIS — Z6832 Body mass index (BMI) 32.0-32.9, adult: Secondary | ICD-10-CM

## 2020-06-11 DIAGNOSIS — Z91030 Bee allergy status: Secondary | ICD-10-CM | POA: Diagnosis not present

## 2020-06-11 DIAGNOSIS — I2510 Atherosclerotic heart disease of native coronary artery without angina pectoris: Secondary | ICD-10-CM | POA: Diagnosis present

## 2020-06-11 DIAGNOSIS — Z66 Do not resuscitate: Secondary | ICD-10-CM | POA: Diagnosis present

## 2020-06-11 DIAGNOSIS — E785 Hyperlipidemia, unspecified: Secondary | ICD-10-CM | POA: Diagnosis not present

## 2020-06-11 DIAGNOSIS — E1142 Type 2 diabetes mellitus with diabetic polyneuropathy: Secondary | ICD-10-CM | POA: Diagnosis not present

## 2020-06-11 DIAGNOSIS — E6609 Other obesity due to excess calories: Secondary | ICD-10-CM | POA: Diagnosis present

## 2020-06-11 DIAGNOSIS — R0602 Shortness of breath: Secondary | ICD-10-CM | POA: Diagnosis not present

## 2020-06-11 DIAGNOSIS — Z79899 Other long term (current) drug therapy: Secondary | ICD-10-CM

## 2020-06-11 DIAGNOSIS — E782 Mixed hyperlipidemia: Secondary | ICD-10-CM | POA: Diagnosis not present

## 2020-06-11 DIAGNOSIS — Z888 Allergy status to other drugs, medicaments and biological substances status: Secondary | ICD-10-CM | POA: Diagnosis not present

## 2020-06-11 DIAGNOSIS — F419 Anxiety disorder, unspecified: Secondary | ICD-10-CM | POA: Diagnosis present

## 2020-06-11 DIAGNOSIS — M109 Gout, unspecified: Secondary | ICD-10-CM | POA: Diagnosis present

## 2020-06-11 DIAGNOSIS — L8990 Pressure ulcer of unspecified site, unspecified stage: Secondary | ICD-10-CM | POA: Insufficient documentation

## 2020-06-11 DIAGNOSIS — Z800 Family history of malignant neoplasm of digestive organs: Secondary | ICD-10-CM

## 2020-06-11 DIAGNOSIS — R531 Weakness: Secondary | ICD-10-CM

## 2020-06-11 DIAGNOSIS — Z85528 Personal history of other malignant neoplasm of kidney: Secondary | ICD-10-CM

## 2020-06-11 DIAGNOSIS — Z803 Family history of malignant neoplasm of breast: Secondary | ICD-10-CM

## 2020-06-11 DIAGNOSIS — Z8249 Family history of ischemic heart disease and other diseases of the circulatory system: Secondary | ICD-10-CM

## 2020-06-11 DIAGNOSIS — R6889 Other general symptoms and signs: Secondary | ICD-10-CM | POA: Diagnosis not present

## 2020-06-11 DIAGNOSIS — F324 Major depressive disorder, single episode, in partial remission: Secondary | ICD-10-CM | POA: Diagnosis not present

## 2020-06-11 DIAGNOSIS — R404 Transient alteration of awareness: Secondary | ICD-10-CM | POA: Diagnosis not present

## 2020-06-11 LAB — POC SARS CORONAVIRUS 2 AG -  ED: SARS Coronavirus 2 Ag: POSITIVE — AB

## 2020-06-11 LAB — COMPREHENSIVE METABOLIC PANEL
ALT: 16 U/L (ref 0–44)
AST: 23 U/L (ref 15–41)
Albumin: 3.3 g/dL — ABNORMAL LOW (ref 3.5–5.0)
Alkaline Phosphatase: 47 U/L (ref 38–126)
Anion gap: 7 (ref 5–15)
BUN: 32 mg/dL — ABNORMAL HIGH (ref 8–23)
CO2: 19 mmol/L — ABNORMAL LOW (ref 22–32)
Calcium: 8.1 mg/dL — ABNORMAL LOW (ref 8.9–10.3)
Chloride: 114 mmol/L — ABNORMAL HIGH (ref 98–111)
Creatinine, Ser: 1.75 mg/dL — ABNORMAL HIGH (ref 0.61–1.24)
GFR, Estimated: 40 mL/min — ABNORMAL LOW (ref >60)
Glucose, Bld: 81 mg/dL (ref 70–99)
Potassium: 5.3 mmol/L — ABNORMAL HIGH (ref 3.5–5.1)
Sodium: 140 mmol/L (ref 135–145)
Total Bilirubin: 0.5 mg/dL (ref 0.3–1.2)
Total Protein: 6.8 g/dL (ref 6.5–8.1)

## 2020-06-11 LAB — CBG MONITORING, ED
Glucose-Capillary: 68 mg/dL — ABNORMAL LOW (ref 70–99)
Glucose-Capillary: 142 mg/dL — ABNORMAL HIGH (ref 70–99)
Glucose-Capillary: 221 mg/dL — ABNORMAL HIGH (ref 70–99)

## 2020-06-11 LAB — CBC WITH DIFFERENTIAL/PLATELET
Abs Immature Granulocytes: 0.04 10*3/uL (ref 0.00–0.07)
Basophils Absolute: 0 10*3/uL (ref 0.0–0.1)
Basophils Relative: 0 %
Eosinophils Absolute: 0.1 10*3/uL (ref 0.0–0.5)
Eosinophils Relative: 1 %
HCT: 39.3 % (ref 39.0–52.0)
Hemoglobin: 11.5 g/dL — ABNORMAL LOW (ref 13.0–17.0)
Immature Granulocytes: 0 %
Lymphocytes Relative: 24 %
Lymphs Abs: 2.1 10*3/uL (ref 0.7–4.0)
MCH: 28.5 pg (ref 26.0–34.0)
MCHC: 29.3 g/dL — ABNORMAL LOW (ref 30.0–36.0)
MCV: 97.5 fL (ref 80.0–100.0)
Monocytes Absolute: 1.3 10*3/uL — ABNORMAL HIGH (ref 0.1–1.0)
Monocytes Relative: 15 %
Neutro Abs: 5.5 10*3/uL (ref 1.7–7.7)
Neutrophils Relative %: 60 %
Platelets: 187 10*3/uL (ref 150–400)
RBC: 4.03 MIL/uL — ABNORMAL LOW (ref 4.22–5.81)
RDW: 19.1 % — ABNORMAL HIGH (ref 11.5–15.5)
WBC: 9 10*3/uL (ref 4.0–10.5)
nRBC: 0 % (ref 0.0–0.2)

## 2020-06-11 LAB — FERRITIN: Ferritin: 101 ng/mL (ref 24–336)

## 2020-06-11 LAB — LACTATE DEHYDROGENASE: LDH: 111 U/L (ref 98–192)

## 2020-06-11 LAB — FIBRINOGEN: Fibrinogen: 746 mg/dL — ABNORMAL HIGH (ref 210–475)

## 2020-06-11 LAB — LACTIC ACID, PLASMA: Lactic Acid, Venous: 1 mmol/L (ref 0.5–1.9)

## 2020-06-11 LAB — D-DIMER, QUANTITATIVE: D-Dimer, Quant: 0.62 ug/mL-FEU — ABNORMAL HIGH (ref 0.00–0.50)

## 2020-06-11 LAB — PROCALCITONIN: Procalcitonin: <0.1 ng/mL

## 2020-06-11 LAB — C-REACTIVE PROTEIN: CRP: 10.8 mg/dL — ABNORMAL HIGH (ref <1.0)

## 2020-06-11 LAB — TRIGLYCERIDES: Triglycerides: 131 mg/dL (ref <150)

## 2020-06-11 MED ORDER — PREDNISONE 20 MG PO TABS
50.0000 mg | ORAL_TABLET | Freq: Every day | ORAL | Status: DC
  Administered 2020-06-15: 50 mg via ORAL
  Filled 2020-06-11: qty 1

## 2020-06-11 MED ORDER — AMLODIPINE BESYLATE 5 MG PO TABS
10.0000 mg | ORAL_TABLET | Freq: Every day | ORAL | Status: DC
  Administered 2020-06-12 – 2020-06-15 (×4): 10 mg via ORAL
  Filled 2020-06-11 (×4): qty 2

## 2020-06-11 MED ORDER — INSULIN DETEMIR 100 UNIT/ML ~~LOC~~ SOLN
58.0000 [IU] | Freq: Two times a day (BID) | SUBCUTANEOUS | Status: DC
Start: 2020-06-11 — End: 2020-06-11

## 2020-06-11 MED ORDER — INSULIN DETEMIR 100 UNIT/ML ~~LOC~~ SOLN
62.0000 [IU] | Freq: Every day | SUBCUTANEOUS | Status: DC
  Administered 2020-06-12: 22:00:00 62 [IU] via SUBCUTANEOUS
  Filled 2020-06-11 (×4): qty 0.62

## 2020-06-11 MED ORDER — INSULIN DETEMIR 100 UNIT/ML ~~LOC~~ SOLN
58.0000 [IU] | Freq: Every day | SUBCUTANEOUS | Status: DC
  Administered 2020-06-12 – 2020-06-13 (×2): 58 [IU] via SUBCUTANEOUS
  Filled 2020-06-11 (×5): qty 0.58

## 2020-06-11 MED ORDER — DEXTROSE 50 % IV SOLN: 1.0000 | Freq: Once | INTRAVENOUS | Status: AC

## 2020-06-11 MED ORDER — POLYETHYLENE GLYCOL 3350 17 G PO PACK: 17.0000 g | PACK | Freq: Every day | ORAL | Status: DC | PRN

## 2020-06-11 MED ORDER — PRAVASTATIN SODIUM 40 MG PO TABS
ORAL_TABLET | ORAL | Status: AC
  Filled 2020-06-11: qty 2

## 2020-06-11 MED ORDER — BISACODYL 5 MG PO TBEC
5.0000 mg | DELAYED_RELEASE_TABLET | Freq: Every day | ORAL | Status: DC | PRN
  Administered 2020-06-14: 5 mg via ORAL
  Filled 2020-06-11: qty 1

## 2020-06-11 MED ORDER — GABAPENTIN 300 MG PO CAPS
300.0000 mg | ORAL_CAPSULE | Freq: Every day | ORAL | Status: DC
  Administered 2020-06-11 – 2020-06-15 (×5): 300 mg via ORAL
  Filled 2020-06-11 (×5): qty 1

## 2020-06-11 MED ORDER — INSULIN ASPART 100 UNIT/ML ~~LOC~~ SOLN
0.0000 [IU] | Freq: Three times a day (TID) | SUBCUTANEOUS | Status: DC
  Administered 2020-06-12 – 2020-06-13 (×4): 8 [IU] via SUBCUTANEOUS
  Administered 2020-06-13: 13:00:00 11 [IU] via SUBCUTANEOUS
  Administered 2020-06-13: 19:00:00 8 [IU] via SUBCUTANEOUS
  Administered 2020-06-14 – 2020-06-15 (×5): 5 [IU] via SUBCUTANEOUS
  Administered 2020-06-15: 11 [IU] via SUBCUTANEOUS
  Filled 2020-06-11 (×5): qty 1

## 2020-06-11 MED ORDER — METHYLPREDNISOLONE SODIUM SUCC 125 MG IJ SOLR
125.0000 mg | Freq: Once | INTRAMUSCULAR | Status: AC
  Administered 2020-06-11: 18:00:00 125 mg via INTRAVENOUS
  Filled 2020-06-11: qty 2

## 2020-06-11 MED ORDER — SODIUM CHLORIDE 0.9 % IV SOLN: 250.0000 mL | INTRAVENOUS | Status: DC | PRN

## 2020-06-11 MED ORDER — HYDROCOD POLST-CPM POLST ER 10-8 MG/5ML PO SUER: 5.0000 mL | Freq: Two times a day (BID) | ORAL | Status: DC | PRN

## 2020-06-11 MED ORDER — ZINC SULFATE 220 (50 ZN) MG PO CAPS
220.0000 mg | ORAL_CAPSULE | Freq: Every day | ORAL | Status: DC
  Administered 2020-06-11 – 2020-06-15 (×5): 220 mg via ORAL
  Filled 2020-06-11 (×6): qty 1

## 2020-06-11 MED ORDER — SODIUM CHLORIDE 0.9% FLUSH
3.0000 mL | Freq: Two times a day (BID) | INTRAVENOUS | Status: DC
  Administered 2020-06-12 – 2020-06-14 (×4): 3 mL via INTRAVENOUS

## 2020-06-11 MED ORDER — GUAIFENESIN-DM 100-10 MG/5ML PO SYRP: 10.0000 mL | ORAL_SOLUTION | ORAL | Status: DC | PRN

## 2020-06-11 MED ORDER — ISOSORBIDE MONONITRATE ER 60 MG PO TB24
30.0000 mg | ORAL_TABLET | Freq: Every evening | ORAL | Status: DC
  Administered 2020-06-12 – 2020-06-15 (×3): 30 mg via ORAL
  Filled 2020-06-11 (×4): qty 1

## 2020-06-11 MED ORDER — ALBUTEROL SULFATE HFA 108 (90 BASE) MCG/ACT IN AERS: 2.0000 | INHALATION_SPRAY | RESPIRATORY_TRACT | Status: DC | PRN

## 2020-06-11 MED ORDER — DULOXETINE HCL 60 MG PO CPEP
60.0000 mg | ORAL_CAPSULE | Freq: Every day | ORAL | Status: DC
  Administered 2020-06-12 – 2020-06-15 (×4): 60 mg via ORAL
  Filled 2020-06-11: qty 2
  Filled 2020-06-11 (×3): qty 1
  Filled 2020-06-11: qty 2

## 2020-06-11 MED ORDER — SODIUM CHLORIDE 0.9% FLUSH: 3.0000 mL | INTRAVENOUS | Status: DC | PRN

## 2020-06-11 MED ORDER — PRAVASTATIN SODIUM 40 MG PO TABS
80.0000 mg | ORAL_TABLET | Freq: Every day | ORAL | Status: DC
Start: 2020-06-11 — End: 2020-06-16
  Administered 2020-06-12 – 2020-06-15 (×4): 80 mg via ORAL
  Filled 2020-06-11 (×2): qty 2
  Filled 2020-06-11 (×3): qty 1
  Filled 2020-06-11: qty 2

## 2020-06-11 MED ORDER — DOCUSATE SODIUM 100 MG PO CAPS
100.0000 mg | ORAL_CAPSULE | Freq: Two times a day (BID) | ORAL | Status: DC
  Administered 2020-06-11 – 2020-06-15 (×9): 100 mg via ORAL
  Filled 2020-06-11 (×9): qty 1

## 2020-06-11 MED ORDER — ASPIRIN 81 MG PO CHEW
81.0000 mg | CHEWABLE_TABLET | Freq: Every day | ORAL | Status: DC
  Administered 2020-06-12 – 2020-06-15 (×4): 81 mg via ORAL
  Filled 2020-06-11 (×5): qty 1

## 2020-06-11 MED ORDER — ASCORBIC ACID 500 MG PO TABS
500.0000 mg | ORAL_TABLET | Freq: Every day | ORAL | Status: DC
  Administered 2020-06-11 – 2020-06-15 (×5): 500 mg via ORAL
  Filled 2020-06-11 (×6): qty 1

## 2020-06-11 MED ORDER — ONDANSETRON HCL 4 MG/2ML IJ SOLN: 4.0000 mg | Freq: Four times a day (QID) | INTRAMUSCULAR | Status: DC | PRN

## 2020-06-11 MED ORDER — SODIUM CHLORIDE 0.9 % IV SOLN: 200.0000 mg | Freq: Once | INTRAVENOUS | Status: DC

## 2020-06-11 MED ORDER — GABAPENTIN 300 MG PO CAPS: 300.0000 mg | ORAL_CAPSULE | ORAL | Status: DC

## 2020-06-11 MED ORDER — OXYCODONE HCL 5 MG PO TABS
5.0000 mg | ORAL_TABLET | ORAL | Status: DC | PRN
  Administered 2020-06-12 (×2): 5 mg via ORAL
  Filled 2020-06-11 (×2): qty 1

## 2020-06-11 MED ORDER — ENOXAPARIN SODIUM 60 MG/0.6ML ~~LOC~~ SOLN
50.0000 mg | SUBCUTANEOUS | Status: DC
  Administered 2020-06-11 – 2020-06-15 (×5): 50 mg via SUBCUTANEOUS
  Filled 2020-06-11 (×5): qty 0.6

## 2020-06-11 MED ORDER — GABAPENTIN 300 MG PO CAPS
600.0000 mg | ORAL_CAPSULE | Freq: Two times a day (BID) | ORAL | Status: DC
Start: 2020-06-12 — End: 2020-06-16
  Administered 2020-06-12 – 2020-06-15 (×8): 600 mg via ORAL
  Filled 2020-06-11 (×8): qty 2

## 2020-06-11 MED ORDER — SODIUM CHLORIDE 0.9 % IV SOLN
100.0000 mg | Freq: Every day | INTRAVENOUS | Status: DC
  Administered 2020-06-11: 21:00:00 100 mg via INTRAVENOUS
  Filled 2020-06-11: qty 20

## 2020-06-11 MED ORDER — ONDANSETRON HCL 4 MG PO TABS: 4.0000 mg | ORAL_TABLET | Freq: Four times a day (QID) | ORAL | Status: DC | PRN

## 2020-06-11 MED ORDER — SODIUM CHLORIDE 0.9 % IV SOLN
100.0000 mg | Freq: Every day | INTRAVENOUS | Status: AC
  Administered 2020-06-12 – 2020-06-15 (×4): 100 mg via INTRAVENOUS
  Filled 2020-06-11 (×5): qty 20

## 2020-06-11 MED ORDER — FLEET ENEMA 7-19 GM/118ML RE ENEM: 1.0000 | ENEMA | Freq: Once | RECTAL | Status: DC | PRN

## 2020-06-11 MED ORDER — ALPRAZOLAM 0.5 MG PO TABS: 0.5000 mg | ORAL_TABLET | Freq: Every evening | ORAL | Status: DC | PRN

## 2020-06-11 MED ORDER — SODIUM CHLORIDE 0.9 % IV SOLN
1000.0000 mL | INTRAVENOUS | Status: DC
  Administered 2020-06-11: 18:00:00 1000 mL via INTRAVENOUS

## 2020-06-11 MED ORDER — LACTATED RINGERS IV SOLN: INTRAVENOUS | Status: DC

## 2020-06-11 MED ORDER — DEXTROSE 50 % IV SOLN
INTRAVENOUS | Status: AC
  Administered 2020-06-11: 18:00:00 50 mL via INTRAVENOUS
  Filled 2020-06-11: qty 50

## 2020-06-11 MED ORDER — METHYLPREDNISOLONE SODIUM SUCC 125 MG IJ SOLR
50.0000 mg | Freq: Two times a day (BID) | INTRAMUSCULAR | Status: AC
  Administered 2020-06-12 – 2020-06-14 (×6): 50 mg via INTRAVENOUS
  Filled 2020-06-11 (×6): qty 2

## 2020-06-11 MED ORDER — ACETAMINOPHEN 325 MG PO TABS: 650.0000 mg | ORAL_TABLET | Freq: Four times a day (QID) | ORAL | Status: DC | PRN

## 2020-06-11 MED ORDER — SODIUM CHLORIDE 0.9% FLUSH
3.0000 mL | Freq: Two times a day (BID) | INTRAVENOUS | Status: DC
  Administered 2020-06-12 – 2020-06-15 (×5): 3 mL via INTRAVENOUS

## 2020-06-11 MED ORDER — MELATONIN 3 MG PO TABS
9.0000 mg | ORAL_TABLET | Freq: Every day | ORAL | Status: DC
  Administered 2020-06-11 – 2020-06-15 (×5): 9 mg via ORAL
  Filled 2020-06-11 (×5): qty 3

## 2020-06-11 MED ORDER — CARVEDILOL 12.5 MG PO TABS
25.0000 mg | ORAL_TABLET | Freq: Two times a day (BID) | ORAL | Status: DC
Start: 2020-06-12 — End: 2020-06-16
  Administered 2020-06-12 – 2020-06-15 (×7): 25 mg via ORAL
  Filled 2020-06-11 (×7): qty 2

## 2020-06-11 NOTE — H&P (Signed)
History and Physical    Francisco Powell [id number] DOB: [birth year] DOA: 06/11/2020  PCP: [name], MD Consultants:  Domenic Polite - cardiology Patient coming from:  Home - lives alone; NOK: Daughter, [name], [phone]  Chief Complaint:  Worsening COVID infection  HPI: Francisco Powell is a 79 y.o. male with medical history significant of obesity (BMI 32.28); CAD; RCC; HLD; HTN; and DM presenting with worsening COVID symptoms. He started with SOB and cough on 12/26.  He was vaccinated but not boosted.  Home test was positive and he had COVID exposures with multiple family members.  Over the last day, he became increasingly confused with sats in the 80s and so was sent in.   I spoke with his daughter, who is a Marine scientist.  He stays alone at night; his children both live on the same road.  They put him to bed and get him up.  He transfers to wheelchair/recliner but no longer walks.  Has Life Alert.  He told his sister last week that he was ready to "go home... to see Jesus."  He has ACP documents in the chart including DNR.       ED Course:  COVID, sick on 12/26.  Vaccinated but not boosted.  Confused, SOB, cough, less active.  80s, on 6L [place] O2.  Review of Systems: As per HPI; otherwise review of systems reviewed and negative.    COVID Vaccine Status:   Complete, no booster  Past Medical History:  Diagnosis Date  . Anemia, iron deficiency   . Anxiety   . Asbestosis(501)   . Coronary atherosclerosis of native coronary artery    DES RCA and staged DES LAD May 2015, LVEF 55%  . Diabetic peripheral neuropathy ([place]) 08/22/2019  . Essential hypertension   . Gout   . Hx of adenomatous colonic polyps   . Hyperlipidemia   . Kidney carcinoma ([place]) 1998  . Lumbar back pain    Spondylosis and spondylolisthesis   . Myocardial infarction ([place]) 2004  . Nephrolithiasis   . NSTEMI (non-ST elevated myocardial infarction) [facility])    May 2015  . Obesity   . Sciatica of right side   . Type II  diabetes mellitus ([place])     Past Surgical History:  Procedure Laterality Date  . APPENDECTOMY    . BACK SURGERY  X 4  . CHOLECYSTECTOMY     Biliary dyskinesia  . COLONOSCOPY  2009   Dr. Arnoldo Morale: normal  . COLONOSCOPY  June 2011   Dr. Gala Romney: normal rectum, left-sided diverticula  . COLONOSCOPY N/A 04/12/2015   Procedure: COLONOSCOPY;  Surgeon: [name], MD;  [facility];  Service: Endoscopy;  Laterality: N/A;  1115   . CYSTOSCOPY W/ LITHOLAPAXY / EHL  2009  . ESOPHAGOGASTRODUODENOSCOPY  June 2011   Dr. Gala Romney: erosive reflux esophagitis, small hiatal hernia, +H.pylori gastritis  . EYE SURGERY    . HOLMIUM LASER APPLICATION Right [id number]  . LEFT HEART CATHETERIZATION WITH CORONARY ANGIOGRAM N/A 11/09/2013   Procedure: LEFT HEART CATHETERIZATION WITH CORONARY ANGIOGRAM;  Surgeon: [name], MD;  Location: [facility] CATH LAB;  Service: Cardiovascular;  Laterality: N/A;  . LUMBAR DISC SURGERY  X 3  . PERCUTANEOUS CORONARY STENT INTERVENTION (PCI-S) N/A 11/10/2013   Procedure: PERCUTANEOUS CORONARY STENT INTERVENTION (PCI-S);  Surgeon: [name], MD;  Location: [facility] C[facility] LAB;  Service: Cardiovascular;  Laterality: N/A;  . POSTERIOR LUMBAR FUSION  2014  . RETINAL DETACHMENT SURGERY Right 1998  Secondary to injury detached retina/cataracts  . TONSILLECTOMY    . TUMOR EXCISION  1995    Social History   Socioeconomic History  . Marital status: Widowed    Spouse name: Not on file  . Number of children: Not on file  . Years of education: Not on file  . Highest education level: Not on file  Occupational History  . Not on file  Tobacco Use  . Smoking status: Never Smoker  . Smokeless tobacco: Current User    Types: Chew  Vaping Use  . Vaping Use: Never used  Substance and Sexual Activity  . Alcohol use: No    Alcohol/week: 0.0 standard drinks  . Drug use: No  . Sexual activity: Not on file  Other Topics Concern  . Not on file  Social History Narrative  .  Not on file   Social Determinants of Health   Financial Resource Strain: Not on file  Food Insecurity: Not on file  Transportation Needs: Not on file  Physical Activity: Not on file  Stress: Not on file  Social Connections: Not on file  Intimate Partner Violence: Not on file    Allergies  Allergen Reactions  . Penicillins Hives, Itching and Swelling    Has patient had a PCN reaction causing immediate rash, facial/tongue/throat swelling, SOB or lightheadedness with hypotension: Unknown Has patient had a PCN reaction causing severe rash involving mucus membranes or skin necrosis: Unknown Has patient had a PCN reaction that required hospitalization: Unknown Has patient had a PCN reaction occurring within the last 10 years: Unknown If all of the above answers are "NO", then may proceed with Cephalosporin use.   . Other     Bee stings  . Atorvastatin Other (See Comments)    Muscle pain/cramps   . Celexa [Citalopram] Diarrhea    Family History  Problem Relation Age of Onset  . Colon cancer Mother        deceased   . Cancer Maternal Grandmother        Gastric cancer  . Breast cancer Sister   . Heart attack Father   . Colon cancer Maternal Uncle   . Colon cancer Maternal Uncle   . Colon cancer Other        maternal great uncle  . Colon polyps Sister     Prior to Admission medications   Medication Sig Start Date End Date Taking? Authorizing Provider  ALPRAZolam Duanne Moron) 1 MG tablet Take 0.5-1 tablets (0.5-1 mg total) by mouth at bedtime as needed. for sleep 01/01/20  Yes Luking, Scott A, MD  amLODipine (NORVASC) 10 MG tablet TAKE TABLET BY MOUTH ONCE  DAILY. 01/01/20  Yes [name], MD  Ascorbic Acid (VITAMIN C) 1000 MG tablet Take 1,000 mg by mouth in the morning and at bedtime.   Yes [provider]  aspirin 81 MG chewable tablet Chew 81 mg by mouth daily.   Yes [provider]  carvedilol (COREG) 25 MG tablet TAKE TABLET BY MOUTH TWICE  DAILY. 01/01/20   Yes [name], MD  colchicine 0.6 MG tablet Take 1 tablet twice daily for gout relief Patient taking differently: Take 0.6 mg by mouth daily as needed. As needed for gout 11/16/19  Yes Luking, Scott A, MD  diphenhydramine-acetaminophen (TYLENOL PM) 25-500 MG TABS tablet Take 2 tablets by mouth at bedtime as needed.   Yes [provider]  Docusate Sodium (STOOL SOFTENER LAXATIVE PO) Take 1 tablet by mouth in the morning and  at bedtime.   Yes [provider]  DULoxetine (CYMBALTA) 60 MG capsule Take 1 capsule (60 mg total) by mouth daily. 01/01/20  Yes Babs Sciara, MD  gabapentin (NEURONTIN) 300 MG capsule Take two capsules po each morning, one capsule mid afternoon and 2 each evening.  CAUTION:DROWSINESS Patient taking differently: Take 300 mg by mouth See admin instructions. Take 600 mg in the morning, 600 in the afternoon and 300 at bedtime 05/03/20  Yes Luking, Scott A, MD  HUMALOG KWIKPEN 100 UNIT/ML KwikPen INJECT SUBCUTANEOUSLY 25  UNITS 3 TIMES DAILY Patient taking differently: Inject 25 Units into the skin 3 (three) times daily. 04/18/20  Yes Luking, Scott A, MD  insulin detemir (LEVEMIR) 100 UNIT/ML injection INJECT SUBCUTANEOUSLY 58  UNITS IN THE MORNING AND 62 UNITS IN THE EVENING 01/12/20  Yes Luking, Scott A, MD  isosorbide mononitrate (IMDUR) 30 MG 24 hr tablet Take 30 mg (1 tablet) every evening. Patient taking differently: Take 30 mg by mouth daily. Take 30 mg (1 tablet) every evening. 09/07/19  Yes Jonelle Sidle, MD  lisinopril (ZESTRIL) 40 MG tablet Take one tablet daily 05/05/20  Yes Luking, Jonna Coup, MD  Melatonin 10 MG TABS Take 10 mg by mouth at bedtime.   Yes [provider]  metFORMIN (GLUCOPHAGE) 500 MG tablet 1 bid 01/01/20  Yes Luking, Jonna Coup, MD  Misc Natural Products (GLUCOSAMINE CHOND CMP ADVANCED PO) Take by mouth. bid   Yes [provider]  Misc Natural Products (OSTEO BI-FLEX TRIPLE STRENGTH PO) Take 1 tablet by mouth  daily.   Yes [provider]  Multiple Vitamins-Minerals (MULTIVITAMIN WITH MINERALS) tablet Take 1 tablet by mouth daily.   Yes [provider]  nitroGLYCERIN (NITROSTAT) 0.4 MG SL tablet Place 1 tablet (0.4 mg total) under the tongue every 5 (five) minutes x 3 doses as needed for chest pain. 09/07/19  Yes Jonelle Sidle, MD  pravastatin (PRAVACHOL) 80 MG tablet 1 each evening 01/01/20  Yes Luking, Scott A, MD  psyllium (REGULOID) 0.52 g capsule Take 0.52 g by mouth daily.   Yes [provider]  Semaglutide, 1 MG/DOSE, (OZEMPIC, 1 MG/DOSE,) 2 MG/1.5ML SOPN Inject one mg once weekly 01/01/20  Yes Luking, Scott A, MD  spironolactone (ALDACTONE) 25 MG tablet TAKE 1 TABLET BY MOUTH  DAILY 05/16/20  Yes Luking, Scott A, MD  insulin lispro (HUMALOG) 100 UNIT/ML injection 25 units tid Patient not taking: No sig reported 02/09/20   Babs Sciara, MD  sulfamethoxazole-trimethoprim (BACTRIM DS) 800-160 MG tablet Take 1 tablet by mouth 2 (two) times daily. Patient not taking: No sig reported 05/31/20   Babs Sciara, MD    Physical Exam: Vitals:   06/11/20 1830 06/11/20 1900 06/11/20 1930 06/11/20 2000  BP: 111/61 119/69 111/62 123/72  Pulse: (!) 58 (!) 58 (!) 56 (!) 58  Resp: (!) 25 (!) 22 18 19   SpO2: 92% 94% 96% 96%  Weight:      Height:         . General:  Appears calm and comfortable and is in NAD; he has apparent cognitive slowing but is able to answer questions appropriately . Eyes:   normal lids, iris . ENT:  grossly normal hearing, lips & tongue, moderately dry mm . Neck:  no LAD, masses or thyromegaly . Cardiovascular:  RR with mild bradycardia, no m/r/g. No LE edema.  Respiratory:   Scattered rhonchi.  Mildly increased respiratory effort. On 6L [place] O2 . Abdomen:  soft, NT, ND, NABS . Back:   normal alignment, no CVAT . Skin:  no rash or induration seen on limited exam . Musculoskeletal:  decreased tone BUE/BLE, good ROM, no bony abnormality . Lower  extremity:  No LE edema.  Limited foot exam with no ulcerations.  2+ distal pulses. Marland Kitchen Psychiatric:  blunted mood and affect, speech slow but appropriate, AOx3, cognitive slowing but intact . Neurologic:  Unable to effective perform    Radiological Exams on Admission: Independently reviewed - see discussion in A/P where applicable  DG Chest Port 1 View  Result Date: 06/11/2020 CLINICAL DATA:  Shortness of breath.  COVID positive. EXAM: PORTABLE CHEST 1 VIEW COMPARISON:  08/10/2019 FINDINGS: Cardiomegaly, equivocally increased versus differences in technique. Unchanged mediastinal contours. Vague heterogeneous opacities in the mid lower lung zones. No pneumothorax or large pleural effusion. No acute osseous abnormalities are seen. Degenerative change of both shoulders. IMPRESSION: 1. Vague heterogeneous opacities in the mid lower lung zones, pattern consistent with COVID-19 pneumonia. 2. Cardiomegaly, equivocally increased from prior exam versus differences in technique. Electronically Signed   By: Keith Rake M.D.   On: 06/11/2020 18:22    EKG: Independently reviewed.  NSR with rate 56; RBBB   Labs on Admission: I have personally reviewed the available labs and imaging studies at the time of the admission.  Pertinent labs:   K+ 5.3 CO2 19 BUN 32/Creatinine 1.75/GFR 40; 27/1.11/64 on 10/26 CRP 10.8 Lactate 1.0 Procalcitonin <0.10 WBC 9.0 Hgb 11.5 D-dimer 0.62 Fibrinogen 746 Urine culture on 12/14 +Proteus mirabilis, resistant to Nitrofurantoin, Tetracycline COVID POSITIVE   Assessment/Plan Principal Problem:   Acute hypoxemic respiratory failure due to COVID-19 [facility]) Active Problems:   Hyperlipidemia   Essential hypertension   Coronary atherosclerosis of native coronary artery   Depression   Generalized weakness   Chronic low back pain   Class 1 obesity due to excess calories with body mass index (BMI) of 32.0 to 32.9 in adult   Uncontrolled type 2 diabetes mellitus  with hyperglycemia (HCC)   Acute respiratory failure with hypoxia due to COVID-19 PNA -Patient with presenting with SOB and cough with known COVID infection -He does not have a usual home O2 requirement and is currently requiring 6L [place] O2 -COVID POSITIVE -The patient has comorbidities which may increase the risk for ARDS/MODS including: age, HTN, DM, obesity, CAD, debility -Pertinent labs concerning for COVID include normal WBC count; increased BUN/Creatinine; elevated D-dimer (not >1); low procalcitonin; markedly elevated CRP (>7); increased fibrinogen -CXR with multifocal opacities which may be c/w COVID vs. Multifocal PNA -Will not treat with broad-spectrum antibiotics given procalcitonin <0.5 -Will admit for further evaluation, close monitoring, and treatment -Monitor on telemetry x at least 24 hours -At this time, will attempt to avoid use of aerosolized medications and use HFAs instead -Will check daily labs including BMP with Mag, Phos; LFTs; CBC with differential; CRP; ferritin; fibrinogen; D-dimer -Will order steroids and Remdesivir (pharmacy consult) given +COVID test, +CXR, and hypoxia <94% on room air -If the patient shows clinical deterioration, consider transfer to ICU with PCCM consultation -Consider IL-6 agonist (Actemra) and/or JAK inhibitor (baricitinib) if the patient does not stabilize on current treatment or if the patient has marked clinical decompensation; the patient does not appear to require this treatment at this time but has been consented and agrees to receive treatment if there is evidence of clinical worsening despite current treatments. -Will attempt to maintain euvolemia to a net negative fluid status - currently dry and  so giving fluids -Will ask the patient to maintain an awake prone position as much as possible -Patient was seen wearing full PPE including: gown, gloves, head cover, N95, and face shield; donning and doffing was in compliance with current  standards.  AKI -Likely resulting from acute infection and decreased PO intake -Will gently rehydrate with 100 cc/hr LR for now -Recommend stopping fluids as soon as appropriate to avoid volume overload/ARDS -Recheck BMP in AM -Hold Lisinopril, Metformin, Spironolactone  DM -Recent A1c was 7.3 -May utilize continuous glucose monitoring system if available at [facility] -Continue Levemir -hold Glucophage, Ozempic -Cover with moderate-scale SSI  CAD -No current c/o CP -Continue ASA, Imdur  HTN -Continue Norvasc, Coreg -Hold LIsinopril, Spironolactone  HLD -Continue Pravachol  Depression/anxiety -Continue Xanax, Cymbalta  Chronic pain/debility -Daughter reports multiple prior back surgeries resulting in immobility -He is no longer ambulatory -His debility places him at higher risks for complications from acute illness and requires greater resources for his care -Continue Neurontin  Obesity -Body mass index is 32.28 kg/m..  -Weight loss should be encouraged -Outpatient PCP/bariatric medicine f/u encouraged  Goals of care/DNR -The patient has very poor mobility at baseline and an overall poor quality of life (according to his daughter) -Will continue with full treatment at this time but would not resuscitate should the situation arise -If he worsens and a demise appears to be likely or inevitable, the family would consider transition to comfort measures only -He appears to qualify for visitors despite his COVID status due to his baseline debility for compassionate reasons   DVT prophylaxis:  Lovenox  Code Status: DNR - confirmed with patient/family/ACP documents Family Communication: None present; I spoke with the patient's daughter by telephone. Disposition Plan:  The patient is from: home  Anticipated d/c is to: home without [facility] services once his respiratory issues have been resolved.  He may require home O2 at the time of discharge.  Anticipated d/c date will depend on  clinical response to treatment, likely between 3 days (with completion of outpatient Remdesivir treatment) and 5 days  Patient is currently: acutely ill Consults called: None  Admission status: Admit - It is my clinical opinion that admission to INPATIENT is reasonable and necessary because of the expectation that this patient will require hospital care that crosses at least 2 midnights to treat this condition based on the medical complexity of the problems presented.  Given the aforementioned information, the predictability of an adverse outcome is felt to be significant.   Karmen Bongo MD [facility]   How to contact the [facility] Attending or Consulting provider [place] or covering provider during after hours [place], for this patient?  1. Check the care team in [facility] and look for a) attending/consulting [facility] provider listed and b) the [facility] team listed 2. Log into [email or web address] and use [place]'s universal password to access. If you do not have the password, please contact the hospital operator. 3. Locate the [facility] provider you are looking for under [facility] and page to a number that you can be directly reached. 4. If you still have difficulty reaching the provider, please page the [facility] (Director on Call) for the Hospitalists listed on amion for assistance.   06/11/2020, 9:26 PM

## 2020-06-11 NOTE — Telephone Encounter (Signed)
1. Antibiotics do not work for Covid related cough and congestion 2. Unfortunately we do not have any infusion antibodies via our practice the only resource we have is referral to the infusion hotline and because of the sheer numbers of individuals sick it is more than likely they will not have that antibodies 3. It is very important for family to wear mask when around Zollie Beckers, I would only recommend a limited number of people helping him currently unless of course they have recently had Covid for which more than likely they would be protected 4. Giving [place] daily update, make sure her O2 saturations are staying in the 90s if they drop into the 80s he needs to go to the ER 5. It is quite possible logistically that getting a antibody infusion will not be possible because of his limited mobility

## 2020-06-11 NOTE — ED Triage Notes (Signed)
t covid + on Sunday. Sob, weakness since

## 2020-06-11 NOTE — Telephone Encounter (Signed)
Nurses Please reach out to [facility] the daughter 1.  Nurses-please forward patient's information to the infusion clinic so they can evaluate whether or not he is a good candidate for antibodies-it should be noted that they are currently having a shortage of antibodies but it is possible that they may be able to assist him because of his increased risk of obesity, age, diabetes  2.  Nurses-please make sure Pam knows that we want to keep the O2 saturations in the 90s or better if they drop into the 80s that is a clear sign he must go to the ER.  Also if he is having passing out spells, confusion, shortness of breath with sitting still these are all signs he needs to go as well.  3.  Pam should give [place] an update on Wednesday how he is doing  I will be back in the office on Wednesday thank you

## 2020-06-11 NOTE — ED Provider Notes (Signed)
[facility] EMERGENCY DEPARTMENT Provider Note   CSN: [id number] Arrival date & time: 06/11/20  1738     History Chief Complaint  Patient presents with   Covid Positive    Francisco Powell is a 79 y.o. male.  He has a history of diabetes and coronary artery disease, chronic back problems and uses a walker.  Lives at home with family.  Has been sick for a few days with cough shortness of breath weakness.  Tested positive for Covid yesterday.  Increased respiratory distress today.  EMS found his pulse ox to be in the mid 80s and placed on a nonrebreather.  Patient is minimally conversant.  Level 5 caveat.  He has been vaccinated but I do not see a booster vaccination.  The history is provided by the EMS personnel. The history is limited by the condition of the patient.  Shortness of Breath Severity:  Severe Onset quality:  Gradual Duration:  3 days Timing:  Constant Progression:  Worsening Chronicity:  New Relieved by:  Nothing Ineffective treatments:  None tried      Past Medical History:  Diagnosis Date   Anemia, iron deficiency    Anxiety    Asbestosis(501)    Coronary atherosclerosis of native coronary artery    DES RCA and staged DES LAD May 2015, LVEF 55%   Diabetic peripheral neuropathy (HCC) 08/22/2019   Essential hypertension    Gout    Hx of adenomatous colonic polyps    Hyperlipidemia    Kidney carcinoma (HCC) 1998   Lumbar back pain    Spondylosis and spondylolisthesis    Myocardial infarction [facility]) 2004   Nephrolithiasis    NSTEMI (non-ST elevated myocardial infarction) [facility])    May 2015   Obesity    Sciatica of right side    Type II diabetes mellitus (HCC)     Patient Active Problem List   Diagnosis Date Noted   Diabetic peripheral neuropathy (HCC) 08/22/2019   Accelerated hypertension    Coronary artery disease    Cardiac arrhythmia    Uncontrolled type 2 diabetes mellitus with hyperglycemia (HCC) 08/10/2019   Major  depression in remission (HCC) 10/07/2018   Morbid obesity (HCC) 10/07/2018   Chest pain 07/24/2017   Depression, major, single episode, in partial remission (HCC) 10/01/2016   Diarrhea 04/20/2016   Fall at home 03/30/2016   Generalized weakness 03/30/2016   Hypokalemia 03/30/2016   Chronic low back pain 03/30/2016   Right leg weakness 03/18/2016   Insulin dependent diabetes mellitus 08/15/2015   History of colonic polyps    Hx of adenomatous colonic polyps 04/04/2015   Elevated PSA 09/04/2014   Adenomatous polyps 03/06/2014   FH: colon cancer 03/06/2014   Helicobacter pylori (H. pylori) infection 03/06/2014   Depression 01/14/2014   Coronary atherosclerosis of native coronary artery 11/11/2013   Bifascicular block - RBBB, LAFB 11/10/2013   Essential hypertension 11/10/2013   Hyperlipidemia 10/31/2008    Past Surgical History:  Procedure Laterality Date   APPENDECTOMY     BACK SURGERY  X 4   CHOLECYSTECTOMY     Biliary dyskinesia   COLONOSCOPY  2009   Dr. Lovell Sheehan: normal   COLONOSCOPY  June 2011   Dr. Jena Gauss: normal rectum, left-sided diverticula   COLONOSCOPY N/A 04/12/2015   Procedure: COLONOSCOPY;  Surgeon: Corbin Ade, MD;  Location: [facility];  Service: Endoscopy;  Laterality: N/A;  1115    CYSTOSCOPY W/ LITHOLAPAXY / EHL  2009   ESOPHAGOGASTRODUODENOSCOPY  June  2011   Dr. Gala Romney: erosive reflux esophagitis, small hiatal hernia, +H.pylori gastritis   EYE SURGERY     HOLMIUM LASER APPLICATION Right [id number]   LEFT HEART CATHETERIZATION WITH CORONARY ANGIOGRAM N/A 11/09/2013   Procedure: LEFT HEART CATHETERIZATION WITH CORONARY ANGIOGRAM;  Surgeon: [name], MD;  Location[facility] C[facility] LAB;  Service: Cardiovascular;  Laterality: N/A;   LUMBAR DISC SURGERY  X 3   PERCUTANEOUS CORONARY STENT INTERVENTION (PCI-S) N/A 11/10/2013   Procedure: PERCUTANEOUS CORONARY STENT INTERVENTION (PCI-S);  Surgeon: [name], MD;  Location: [facility]  CATH LAB;  Service: Cardiovascular;  Laterality: N/A;   POSTERIOR LUMBAR FUSION  2014   RETINAL DETACHMENT SURGERY Right 1998   Secondary to injury detached retina/cataracts   TONSILLECTOMY     TUMOR EXCISION  1995       Family History  Problem Relation Age of Onset   Colon cancer Mother        deceased    Cancer Maternal Grandmother        Gastric cancer   Breast cancer Sister    Heart attack Father    Colon cancer Maternal Uncle    Colon cancer Maternal Uncle    Colon cancer Other        maternal great uncle   Colon polyps Sister     Social History   Tobacco Use   Smoking status: Never Smoker   Smokeless tobacco: Current User    Types: Chew  Vaping Use   Vaping Use: Never used  Substance Use Topics   Alcohol use: No    Alcohol/week: 0.0 standard drinks   Drug use: No    Home Medications Prior to Admission medications   Medication Sig Start Date End Date Taking? Authorizing Provider  ALPRAZolam Duanne Moron) 1 MG tablet Take 0.5-1 tablets (0.5-1 mg total) by mouth at bedtime as needed. for sleep 01/01/20   [name], MD  amLODipine (NORVASC) 10 MG tablet TAKE TABLET BY MOUTH ONCE  DAILY. 01/01/20   [name], MD  Ascorbic Acid (VITAMIN C) 1000 MG tablet Take 1,000 mg by mouth in the morning and at bedtime.    [provider]  aspirin (ASPIRIN 81) 81 MG chewable tablet Chew 81 mg by mouth daily.    [provider]  carvedilol (COREG) 25 MG tablet TAKE TABLET BY MOUTH TWICE  DAILY. 01/01/20   [name], MD  colchicine 0.6 MG tablet Take 1 tablet twice daily for gout relief 11/16/19   Luking, Elayne Snare, MD  diphenhydramine-acetaminophen (TYLENOL PM) 25-500 MG TABS tablet Take 1 tablet by mouth at bedtime as needed.    [provider]  Docusate Sodium (STOOL SOFTENER LAXATIVE PO) Take by mouth.    [provider]  DULoxetine (CYMBALTA) 60 MG capsule Take 1 capsule (60 mg total) by mouth daily. 01/01/20   [name], MD  gabapentin (NEURONTIN) 300 MG capsule Take two capsules po each morning, one capsule mid afternoon and 2 each evening.  CAUTION:DROWSINESS 05/03/20   [name], MD  HUMALOG KWIKPEN 100 UNIT/ML KwikPen INJECT SUBCUTANEOUSLY 25  UNITS 3 TIMES DAILY 04/18/20   [name], MD  insulin detemir (LEVEMIR) 100 UNIT/ML injection INJECT SUBCUTANEOUSLY 58  UNITS IN THE MORNING AND 62 UNITS IN THE EVENING 01/12/20   Sallee Lange A, MD  insulin lispro (HUMALOG) 100 UNIT/ML injection 25 units tid 02/09/20   [name], MD  isosorbide mononitrate (IMDUR) 30 MG 24 hr  tablet Take 30 mg (1 tablet) every evening. 09/07/19   [name], MD  lisinopril (ZESTRIL) 40 MG tablet Take one tablet daily 05/05/20   [name], MD  Melatonin 10 MG TABS Take 10 mg by mouth at bedtime.    [provider]  metFORMIN (GLUCOPHAGE) 500 MG tablet 1 bid 01/01/20   Luking, Elayne Snare, MD  Misc Natural Products (GLUCOSAMINE CHOND CMP ADVANCED PO) Take by mouth. bid    [provider]  Multiple Vitamins-Minerals (MULTIVITAMIN WITH MINERALS) tablet Take 1 tablet by mouth daily.    [provider]  nitroGLYCERIN (NITROSTAT) 0.4 MG SL tablet Place 1 tablet (0.4 mg total) under the tongue every 5 (five) minutes x 3 doses as needed for chest pain. 09/07/19   [name], MD  pravastatin (PRAVACHOL) 80 MG tablet 1 each evening 01/01/20   [name], MD  Semaglutide, 1 MG/DOSE, (OZEMPIC, 1 MG/DOSE,) 2 MG/1.5ML SOPN Inject one mg once weekly 01/01/20   [name], MD  spironolactone (ALDACTONE) 25 MG tablet TAKE 1 TABLET BY MOUTH  DAILY 05/16/20   [name], MD  sulfamethoxazole-trimethoprim (BACTRIM DS) 800-160 MG tablet Take 1 tablet by mouth 2 (two) times daily. 05/31/20   [name], MD    Allergies    Penicillins, Other, Atorvastatin, and Celexa [citalopram]  Review of Systems   Review of Systems  Unable to perform ROS: Mental status change   Respiratory: Positive for shortness of breath.     Physical Exam Updated Vital Signs BP (!) 143/95    Pulse 71    Temp 98.4 F (36.9 C) (Oral)    Resp 18    Ht 6' (1.829 m)    Wt 108 kg    SpO2 99%    BMI 32.28 kg/m   Physical Exam Vitals and nursing note reviewed.  Constitutional:      General: He is in acute distress.     Appearance: He is well-developed and well-nourished. He is ill-appearing.  HENT:     Head: Normocephalic and atraumatic.  Eyes:     Conjunctiva/sclera: Conjunctivae normal.  Cardiovascular:     Rate and Rhythm: Normal rate and regular rhythm.     Heart sounds: No murmur heard.   Pulmonary:     Effort: Tachypnea, accessory muscle usage and respiratory distress present.     Breath sounds: Rhonchi present.  Abdominal:     Palpations: Abdomen is soft.     Tenderness: There is no abdominal tenderness.  Musculoskeletal:        General: No deformity, signs of injury or edema. Normal range of motion.     Cervical back: Neck supple.  Skin:    General: Skin is warm and dry.  Neurological:     General: No focal deficit present.     Comments: Patient arouses to voice.  He is not answering any questions.  He can follow commands and is moving all extremities.  Psychiatric:        Mood and Affect: Mood and affect normal.     ED Results / Procedures / Treatments   Labs (all labs ordered are listed, but only abnormal results are displayed) Labs Reviewed  CBC WITH DIFFERENTIAL/PLATELET - Abnormal; Notable for the following components:      Result Value   RBC 4.03 (*)    Hemoglobin 11.5 (*)    MCHC 29.3 (*)    RDW 19.1 (*)    Monocytes Absolute 1.3 (*)  All other components within normal limits  COMPREHENSIVE METABOLIC PANEL - Abnormal; Notable for the following components:   Potassium 5.3 (*)    Chloride 114 (*)    CO2 19 (*)    BUN 32 (*)    Creatinine, Ser 1.75 (*)    Calcium 8.1 (*)    Albumin 3.3 (*)    GFR, Estimated 40 (*)    All other  components within normal limits  D-DIMER, QUANTITATIVE (NOT AT [facility]) - Abnormal; Notable for the following components:   D-Dimer, Quant 0.62 (*)    All other components within normal limits  FIBRINOGEN - Abnormal; Notable for the following components:   Fibrinogen 746 (*)    All other components within normal limits  C-REACTIVE PROTEIN - Abnormal; Notable for the following components:   CRP 10.8 (*)    All other components within normal limits  CBC WITH DIFFERENTIAL/PLATELET - Abnormal; Notable for the following components:   Hemoglobin 12.4 (*)    MCHC 29.9 (*)    RDW 19.1 (*)    Neutro Abs 7.8 (*)    Abs Immature Granulocytes 0.09 (*)    All other components within normal limits  COMPREHENSIVE METABOLIC PANEL - Abnormal; Notable for the following components:   Potassium 5.8 (*)    CO2 18 (*)    Glucose, Bld 296 (*)    BUN 33 (*)    Creatinine, Ser 1.42 (*)    Calcium 8.6 (*)    Albumin 3.4 (*)    GFR, Estimated 51 (*)    All other components within normal limits  C-REACTIVE PROTEIN - Abnormal; Notable for the following components:   CRP 14.0 (*)    All other components within normal limits  D-DIMER, QUANTITATIVE (NOT AT [facility]) - Abnormal; Notable for the following components:   D-Dimer, Quant 0.70 (*)    All other components within normal limits  POC SARS CORONAVIRUS 2 AG -  ED - Abnormal; Notable for the following components:   SARS Coronavirus 2 Ag POSITIVE (*)    All other components within normal limits  CBG MONITORING, ED - Abnormal; Notable for the following components:   Glucose-Capillary 68 (*)    All other components within normal limits  CBG MONITORING, ED - Abnormal; Notable for the following components:   Glucose-Capillary 142 (*)    All other components within normal limits  CBG MONITORING, ED - Abnormal; Notable for the following components:   Glucose-Capillary 221 (*)    All other components within normal limits  CBG MONITORING, ED - Abnormal; Notable for  the following components:   Glucose-Capillary 268 (*)    All other components within normal limits  CULTURE, BLOOD (ROUTINE X 2)  CULTURE, BLOOD (ROUTINE X 2)  LACTIC ACID, PLASMA  PROCALCITONIN  LACTATE DEHYDROGENASE  FERRITIN  TRIGLYCERIDES  FERRITIN  MAGNESIUM  PHOSPHORUS    EKG EKG Interpretation  Date/Time:  Tuesday June 11 2020 17:46:52 EST Ventricular Rate:  56 PR Interval:    QRS Duration: 156 QT Interval:  427 QTC Calculation: 413 R Axis:   -67 Text Interpretation: Sinus rhythm Prolonged PR interval Right bundle branch block Inferior infarct, old Confirmed by Aletta Edouard [phone]) on 06/11/2020 5:55:20 PM   Radiology DG Chest Port 1 View  Result Date: 06/11/2020 CLINICAL DATA:  Shortness of breath.  COVID positive. EXAM: PORTABLE CHEST 1 VIEW COMPARISON:  08/10/2019 FINDINGS: Cardiomegaly, equivocally increased versus differences in technique. Unchanged mediastinal contours. Vague heterogeneous opacities in the mid lower lung  zones. No pneumothorax or large pleural effusion. No acute osseous abnormalities are seen. Degenerative change of both shoulders. IMPRESSION: 1. Vague heterogeneous opacities in the mid lower lung zones, pattern consistent with COVID-19 pneumonia. 2. Cardiomegaly, equivocally increased from prior exam versus differences in technique. Electronically Signed   By: Keith Rake M.D.   On: 06/11/2020 18:22    Procedures .Critical Care Performed by: Hayden Rasmussen, MD Authorized by: Hayden Rasmussen, MD   Critical care provider statement:    Critical care time (minutes):  45   Critical care time was exclusive of:  Separately billable procedures and treating other patients   Critical care was necessary to treat or prevent imminent or life-threatening deterioration of the following conditions:  Respiratory failure   Critical care was time spent personally by me on the following activities:  Discussions with consultants, evaluation of  patient's response to treatment, examination of patient, ordering and performing treatments and interventions, ordering and review of laboratory studies, ordering and review of radiographic studies, pulse oximetry, re-evaluation of patient's condition, obtaining history from patient or surrogate, review of old charts and development of treatment plan with patient or surrogate   (including critical care time)  Medications Ordered in ED Medications  remdesivir 100 mg in sodium chloride 0.9 % 100 mL IVPB (has no administration in time range)  aspirin chewable tablet 81 mg (81 mg Oral Given 06/12/20 0914)  amLODipine (NORVASC) tablet 10 mg (10 mg Oral Given 06/12/20 0913)  carvedilol (COREG) tablet 25 mg (25 mg Oral Given 06/12/20 0914)  isosorbide mononitrate (IMDUR) 24 hr tablet 30 mg (has no administration in time range)  pravastatin (PRAVACHOL) tablet 80 mg (0 mg Oral Hold 06/11/20 2240)  ALPRAZolam (XANAX) tablet 0.5-1 mg (has no administration in time range)  DULoxetine (CYMBALTA) DR capsule 60 mg (60 mg Oral Given 06/12/20 0913)  melatonin tablet 9 mg (9 mg Oral Given 06/11/20 2226)  insulin aspart (novoLOG) injection 0-15 Units (8 Units Subcutaneous Given 06/12/20 0912)  enoxaparin (LOVENOX) injection 50 mg (50 mg Subcutaneous Given 06/11/20 2231)  sodium chloride flush (NS) 0.9 % injection 3 mL (3 mLs Intravenous Given 06/12/20 0915)  sodium chloride flush (NS) 0.9 % injection 3 mL (3 mLs Intravenous Given 06/12/20 0914)  sodium chloride flush (NS) 0.9 % injection 3 mL (has no administration in time range)  0.9 %  sodium chloride infusion (has no administration in time range)  albuterol (VENTOLIN HFA) 108 (90 Base) MCG/ACT inhaler 2 puff (has no administration in time range)  methylPREDNISolone sodium succinate (SOLU-MEDROL) 125 mg/2 mL injection 50 mg (50 mg Intravenous Given 06/12/20 0542)    Followed by  predniSONE (DELTASONE) tablet 50 mg (has no administration in time range)   guaiFENesin-dextromethorphan (ROBITUSSIN DM) 100-10 MG/5ML syrup 10 mL (has no administration in time range)  chlorpheniramine-HYDROcodone (TUSSIONEX) 10-8 MG/5ML suspension 5 mL (has no administration in time range)  ascorbic acid (VITAMIN C) tablet 500 mg (500 mg Oral Given 06/12/20 0914)  zinc sulfate capsule 220 mg (220 mg Oral Given 06/12/20 0913)  acetaminophen (TYLENOL) tablet 650 mg (has no administration in time range)  oxyCODONE (Oxy IR/ROXICODONE) immediate release tablet 5 mg (5 mg Oral Given 06/12/20 1034)  docusate sodium (COLACE) capsule 100 mg (100 mg Oral Given 06/12/20 0913)  polyethylene glycol (MIRALAX / GLYCOLAX) packet 17 g (has no administration in time range)  bisacodyl (DULCOLAX) EC tablet 5 mg (has no administration in time range)  sodium phosphate (FLEET) 7-19 GM/118ML enema 1 enema (has  no administration in time range)  ondansetron (ZOFRAN) tablet 4 mg (has no administration in time range)    Or  ondansetron (ZOFRAN) injection 4 mg (has no administration in time range)  insulin detemir (LEVEMIR) injection 58 Units (58 Units Subcutaneous Given 06/12/20 1129)  insulin detemir (LEVEMIR) injection 62 Units (0 Units Subcutaneous Hold 06/11/20 2300)  gabapentin (NEURONTIN) capsule 600 mg (600 mg Oral Given 06/12/20 0914)  gabapentin (NEURONTIN) capsule 300 mg (300 mg Oral Given 06/11/20 2225)  lactated ringers infusion ( Intravenous Rate/Dose Verify 06/12/20 0654)  baricitinib (OLUMIANT) tablet 2 mg (2 mg Oral Given 06/12/20 0913)  methylPREDNISolone sodium succinate (SOLU-MEDROL) 125 mg/2 mL injection 125 mg (125 mg Intravenous Given 06/11/20 1750)  dextrose 50 % solution 50 mL (50 mLs Intravenous Given 06/11/20 1755)    ED Course  I have reviewed the triage vital signs and the nursing notes.  Pertinent labs & imaging results that were available during my care of the patient were reviewed by me and considered in my medical decision making (see chart for  details).  Clinical Course as of 06/12/20 1204  Tue Jun 11, 2020  1811 Chest x-ray showing likely multifocal infiltrates.  Awaiting radiology reading. [MB]  1849 Discussed with patient's daughter Elita Quick and updated her on his status.  She confirmed the patient has a DNR/DNI and that is also the family wishes.  She is the medical decision maker along with her brother.  She is also a Engineer, civil (consulting).  She understands that he is critically ill. [MB]  1853 Reassessed patient.  He is currently satting in the low 90s on 6 L nasal cannula.  His mental status is somewhat improved and can answer some simple questions.  He denies any pain.  He states he is short of breath [MB]  1933 Discussed with Dr. Ophelia Charter [facility] hospitalist who will evaluate the patient for admission. [MB]    Clinical Course User Index [MB] Terrilee Files, MD   MDM Rules/Calculators/A&P                         KIDUS DELMAN was evaluated in Emergency Department on 06/11/2020 for the symptoms described in the history of present illness. He was evaluated in the context of the global COVID-19 pandemic, which necessitated consideration that the patient might be at risk for infection with the SARS-CoV-2 virus that causes COVID-19. Institutional protocols and algorithms that pertain to the evaluation of patients at risk for COVID-19 are in a state of rapid change based on information released by regulatory bodies including the CDC and federal and state organizations. These policies and algorithms were followed during the patient's care in the ED.  This patient complains of shortness of breath and mental status changes; this involves an extensive number of treatment Options and is a complaint that carries with it a high risk of complications and Morbidity. The differential includes Covid, hypoxia, pneumonia, sepsis, Sirs, metabolic derangement  I ordered, reviewed and interpreted labs, which included CBC with normal white count normal hemoglobin  chemistries with mildly elevated potassium pain, fingerstick low but inflammatory markers elevated, Covid testing positive I ordered medication D50, steroids, IV fluids, remdesivir I ordered imaging studies which included chest x-ray and I independently    visualized and interpreted imaging which showed multifocal pneumonia Additional history obtained from EMS and patient's daughter Previous records obtained and reviewed in epic, no recent admissions.  Did see PCP recently have positive Covid test I consulted [facility]  hospitalist Dr. Lorin Mercy and discussed lab and imaging findings  Critical Interventions: Evaluation work-up of patient's hypoxia altered mental status and SIRS criteria  After the interventions stated above, I reevaluated the patient and found patient to be somewhat improved.  He will need to be admitted to the hospital for further management.  He is critically ill at this time.   Final Clinical Impression(s) / ED Diagnoses Final diagnoses:  Acute hypoxemic respiratory failure due to COVID-19 [facility])  AKI (acute kidney injury) ([place])  Hyperkalemia    Rx / DC Orders ED Discharge Orders    None       Hayden Rasmussen, MD 06/12/20 1208

## 2020-06-12 ENCOUNTER — Encounter (HOSPITAL_COMMUNITY): Payer: Self-pay | Admitting: Internal Medicine

## 2020-06-12 DIAGNOSIS — Z6832 Body mass index (BMI) 32.0-32.9, adult: Secondary | ICD-10-CM

## 2020-06-12 DIAGNOSIS — F324 Major depressive disorder, single episode, in partial remission: Secondary | ICD-10-CM

## 2020-06-12 DIAGNOSIS — M5441 Lumbago with sciatica, right side: Secondary | ICD-10-CM | POA: Diagnosis not present

## 2020-06-12 DIAGNOSIS — N179 Acute kidney failure, unspecified: Secondary | ICD-10-CM

## 2020-06-12 DIAGNOSIS — G8929 Other chronic pain: Secondary | ICD-10-CM

## 2020-06-12 DIAGNOSIS — E6609 Other obesity due to excess calories: Secondary | ICD-10-CM

## 2020-06-12 DIAGNOSIS — I10 Essential (primary) hypertension: Secondary | ICD-10-CM

## 2020-06-12 DIAGNOSIS — J9601 Acute respiratory failure with hypoxia: Secondary | ICD-10-CM

## 2020-06-12 DIAGNOSIS — I2510 Atherosclerotic heart disease of native coronary artery without angina pectoris: Secondary | ICD-10-CM

## 2020-06-12 DIAGNOSIS — E875 Hyperkalemia: Secondary | ICD-10-CM

## 2020-06-12 DIAGNOSIS — E1165 Type 2 diabetes mellitus with hyperglycemia: Secondary | ICD-10-CM

## 2020-06-12 DIAGNOSIS — U071 COVID-19: Principal | ICD-10-CM

## 2020-06-12 DIAGNOSIS — E782 Mixed hyperlipidemia: Secondary | ICD-10-CM

## 2020-06-12 LAB — BLOOD CULTURE ID PANEL (REFLEXED) - BCID2

## 2020-06-12 LAB — CBC WITH DIFFERENTIAL/PLATELET
Abs Immature Granulocytes: 0.09 10*3/uL — ABNORMAL HIGH (ref 0.00–0.07)
Basophils Absolute: 0 10*3/uL (ref 0.0–0.1)
Basophils Relative: 0 %
Eosinophils Absolute: 0 10*3/uL (ref 0.0–0.5)
Eosinophils Relative: 0 %
HCT: 41.5 % (ref 39.0–52.0)
Hemoglobin: 12.4 g/dL — ABNORMAL LOW (ref 13.0–17.0)
Immature Granulocytes: 1 %
Lymphocytes Relative: 13 %
Lymphs Abs: 1.2 10*3/uL (ref 0.7–4.0)
MCH: 28.8 pg (ref 26.0–34.0)
MCHC: 29.9 g/dL — ABNORMAL LOW (ref 30.0–36.0)
MCV: 96.5 fL (ref 80.0–100.0)
Monocytes Absolute: 0.3 10*3/uL (ref 0.1–1.0)
Monocytes Relative: 3 %
Neutro Abs: 7.8 10*3/uL — ABNORMAL HIGH (ref 1.7–7.7)
Neutrophils Relative %: 83 %
Platelets: 190 10*3/uL (ref 150–400)
RBC: 4.3 MIL/uL (ref 4.22–5.81)
RDW: 19.1 % — ABNORMAL HIGH (ref 11.5–15.5)
WBC: 9.5 10*3/uL (ref 4.0–10.5)
nRBC: 0 % (ref 0.0–0.2)

## 2020-06-12 LAB — FERRITIN: Ferritin: 135 ng/mL (ref 24–336)

## 2020-06-12 LAB — COMPREHENSIVE METABOLIC PANEL
ALT: 17 U/L (ref 0–44)
AST: 24 U/L (ref 15–41)
Albumin: 3.4 g/dL — ABNORMAL LOW (ref 3.5–5.0)
Alkaline Phosphatase: 57 U/L (ref 38–126)
Anion gap: 11 (ref 5–15)
BUN: 33 mg/dL — ABNORMAL HIGH (ref 8–23)
CO2: 18 mmol/L — ABNORMAL LOW (ref 22–32)
Calcium: 8.6 mg/dL — ABNORMAL LOW (ref 8.9–10.3)
Chloride: 110 mmol/L (ref 98–111)
Creatinine, Ser: 1.42 mg/dL — ABNORMAL HIGH (ref 0.61–1.24)
GFR, Estimated: 51 mL/min — ABNORMAL LOW (ref >60)
Glucose, Bld: 296 mg/dL — ABNORMAL HIGH (ref 70–99)
Potassium: 5.8 mmol/L — ABNORMAL HIGH (ref 3.5–5.1)
Sodium: 139 mmol/L (ref 135–145)
Total Bilirubin: 0.5 mg/dL (ref 0.3–1.2)
Total Protein: 7.3 g/dL (ref 6.5–8.1)

## 2020-06-12 LAB — CBG MONITORING, ED
Glucose-Capillary: 268 mg/dL — ABNORMAL HIGH (ref 70–99)
Glucose-Capillary: 280 mg/dL — ABNORMAL HIGH (ref 70–99)
Glucose-Capillary: 261 mg/dL — ABNORMAL HIGH (ref 70–99)

## 2020-06-12 LAB — MAGNESIUM: Magnesium: 2 mg/dL (ref 1.7–2.4)

## 2020-06-12 LAB — PHOSPHORUS: Phosphorus: 4.3 mg/dL (ref 2.5–4.6)

## 2020-06-12 LAB — D-DIMER, QUANTITATIVE: D-Dimer, Quant: 0.7 ug/mL-FEU — ABNORMAL HIGH (ref 0.00–0.50)

## 2020-06-12 LAB — C-REACTIVE PROTEIN: CRP: 14 mg/dL — ABNORMAL HIGH (ref <1.0)

## 2020-06-12 MED ORDER — BARICITINIB 2 MG PO TABS
2.0000 mg | ORAL_TABLET | Freq: Every day | ORAL | Status: DC
  Administered 2020-06-12: 09:00:00 2 mg via ORAL
  Filled 2020-06-12: qty 1

## 2020-06-12 NOTE — ED Notes (Signed)
CRITICAL VALUE ALERT  Critical Value:  Blood Culture Anaerobic bottle - Gram Positive Cocci  Date & Time Notied:  06/12/20 1333  Provider Notified: Dr. Gwenlyn Perking  Orders Received/Actions taken: MD notified by page, Thurston Hole, primary RN notified

## 2020-06-12 NOTE — ED Notes (Signed)
Pt awaiting ICU assignment from bed control per report

## 2020-06-12 NOTE — ED Notes (Signed)
System coverage Francisco Powell texted culture result  UA to get in touch with earlier tonight

## 2020-06-12 NOTE — ED Notes (Signed)
Pt did not eat lunch  Reports nothing there he wanted

## 2020-06-12 NOTE — ED Notes (Signed)
Meal from home

## 2020-06-12 NOTE — ED Notes (Signed)
Pt incontinent of urine   No purwick will fit pt   Pt cleaned and while cleaning incontinent of urine   Pt cleaned, bed linen changed

## 2020-06-12 NOTE — Progress Notes (Addendum)
PROGRESS NOTE    Francisco Powell  [id number] DOB: Dec 30, 1940 DOA: 06/11/2020 PCP: [name], MD   Chief Complaint  Patient presents with  . Covid Positive    Brief Narrative:  As per H&P written by Dr. Lorin Mercy on 06/11/2020 Francisco Powell is a 79 y.o. male with medical history significant of obesity (BMI 32.28); CAD; RCC; HLD; HTN; and DM presenting with worsening COVID symptoms. He started with SOB and cough on 12/26.  He was vaccinated but not boosted.  Home test was positive and he had COVID exposures with multiple family members.  Over the last day, he became increasingly confused with sats in the 80s and so was sent in.   I spoke with his daughter, who is a Marine scientist.  He stays alone at night; his children both live on the same road.  They put him to bed and get him up.  He transfers to wheelchair/recliner but no longer walks.  Has Life Alert.  He told his sister last week that he was ready to "go home... to see Jesus."  He has ACP documents in the chart including DNR.     ED Course:  COVID, sick on 12/26.  Vaccinated but not boosted.  Confused, SOB, cough, less active.  80s, on 6L [place] O2.  Assessment & Plan: 1-acute hypoxemic respiratory failure due to COVID-19 ([place]) -Worsening inflammatory markers appreciated -Requiring over 6 L oxygen supplementation -Short of breath, short winded sensation and unable to speak in full sentences on physical examination. -Continue the use of remdesivir and steroids -Start treatment with baricitinib  -Continue bronchodilators and supportive care -Continue the use of flutter valve and incentive spirometer. -Continue to wean oxygen supplementation as tolerated.  2-acute kidney injury -In the setting of prerenal azotemia most likely -Improvement in creatinine appreciated on today's renal function assessment -Continue just rate of fluid resuscitation -Continue to follow renal function -Continue to hold/avoid nephrotoxic agents.  3-type  2 diabetes -Recent A1c 7.3 -Continue the use of sliding scale insulin and Levemir -Holding Metformin and Ozempic while inpatient. -Close monitoring of CBGs while receiving steroid therapy.  4-Hyperlipidemia -Continue statins.  5-Essential hypertension/coronary artery disease -No chest pain -Continue the use of Coreg and amlodipine -Will also continue aspirin and Imdur -Continue telemetry monitoring.  6-hyperkalemia -Mild -No acute abnormalities appreciated on telemetry -continue to follow electrolytes trend -if needed will use lokelma; 5.8  7-Depression -Overall stable -Continue the use of Xanax and Cymbalta -No suicidal ideation or hallucination.  8-class I obesity -Body mass index is 32.28 kg/m. -Low calorie diet and portion control has been discussed with patient.  9-chronic back pain and debility -Patient with prior history of multiple back surgeries resulting in immobility. -Patient no longer ambulatory at baseline -Continue the use of Neurontin and home analgesics  10- 1 out of 2 positive blood culture -Appears to be contaminant -Will wait on speciation; continue holding antibiotics for now.  11-DNR -Patient wishes will be respected -Overall condition is/outcome is guarded currently -Continue to treat what is treatable -Noninvasive or life support interventions desired.  DVT prophylaxis: Lovenox Code Status: DNR Family Communication: No family at bedside. Disposition:   Status is: Inpatient  Dispo: The patient is from: Home              Anticipated d/c is to: To be determined; but hopefully home.              Anticipated d/c date is: 2-3 days  Patient currently no medically stable for discharge; patient is a still with significant shortness of breath on minimal exertion, requiring over 6 L oxygen supplementation and having difficulty speaking in full sentences. Positive tachypnea with activity has been documented. Inflammatory markers has  worsened and going in the wrong direction. Patient will be started on baricitinib, continue remdesivir infusion and the use of his steroids.       Consultants:  None    Procedures:  See below for x-ray report   Antimicrobials/antiviral Remdesivir day 2/5   Subjective: Requiring over 6 L high flow nasal cannula supplementation, no fever, no chest pain no palpitations. Short winded with minimal exertion and unable to speak in full sentences. Worsening changes on his inflammatory markers appreciated.  Objective: Vitals:   06/12/20 1300 06/12/20 1315 06/12/20 1330 06/12/20 1345  BP: (!) 164/91  132/63   Pulse: 76 83 (!) 57 70  Resp: 20 18 (!) 21 15  Temp:      TempSrc:      SpO2: 95% 95% 92% 96%  Weight:      Height:        Intake/Output Summary (Last 24 hours) at 06/12/2020 1400 Last data filed at 06/11/2020 2253 Gross per 24 hour  Intake 615 ml  Output --  Net 615 ml   Filed Weights   06/11/20 1740  Weight: 108 kg    Examination:  General exam: Appears calm and currently afebrile; still having significant shortness of breath, requiring 6 L oxygen supplementation having difficulty speaking in full sentences. Respiratory system: Positive tachypnea with minimal exertion; no using accessory muscles. Positive rhonchi bilaterally. Cardiovascular system: S1 & S2 heard, no rubs, no gallops, no lower extremity edema. Gastrointestinal system: Abdomen is nondistended, soft and nontender. No organomegaly or masses felt. Normal bowel sounds heard. Central nervous system: Alert and oriented. No focal neurological deficits. Extremities: No cyanosis or clubbing. Skin: No petechiae. Psychiatry: Mood & affect appropriate.     Data Reviewed: I have personally reviewed following labs and imaging studies  CBC: Recent Labs  Lab 06/11/20 1750 06/12/20 0450  WBC 9.0 9.5  NEUTROABS 5.5 7.8*  HGB 11.5* 12.4*  HCT 39.3 41.5  MCV 97.5 96.5  PLT 187 190    Basic Metabolic  Panel: Recent Labs  Lab 06/11/20 1750 06/12/20 0450  NA 140 139  K 5.3* 5.8*  CL 114* 110  CO2 19* 18*  GLUCOSE 81 296*  BUN 32* 33*  CREATININE 1.75* 1.42*  CALCIUM 8.1* 8.6*  MG  --  2.0  PHOS  --  4.3    GFR: Estimated Creatinine Clearance: 55.3 mL/min (A) (by C-G formula based on SCr of 1.42 mg/dL (H)).  Liver Function Tests: Recent Labs  Lab 06/11/20 1750 06/12/20 0450  AST 23 24  ALT 16 17  ALKPHOS 47 57  BILITOT 0.5 0.5  PROT 6.8 7.3  ALBUMIN 3.3* 3.4*    CBG: Recent Labs  Lab 06/11/20 1752 06/11/20 1853 06/11/20 2250 06/12/20 0848 06/12/20 1225  GLUCAP 68* 142* 221* 268* 280*     Recent Results (from the past 240 hour(s))  Blood Culture (routine x 2)     Status: None (Preliminary result)   Collection Time: 06/11/20  5:50 PM   Specimen: BLOOD RIGHT HAND  Result Value Ref Range Status   Specimen Description BLOOD RIGHT HAND  Final   Special Requests   Final    BOTTLES DRAWN AEROBIC AND ANAEROBIC Blood Culture adequate volume   Culture  Setup  Time   Final    GRAM POSITIVE COCCI anaerobic bottle Gram Stain Report Called to,Read Back By and Verified With: WHITE,M@1328  by MATTHEWS,B 12.29.21  Performed at [facility], [address] [place], [place] [address]    Culture PENDING  Incomplete   Report Status PENDING  Incomplete  Blood Culture (routine x 2)     Status: None (Preliminary result)   Collection Time: 06/11/20  5:50 PM   Specimen: Blood  Result Value Ref Range Status   Specimen Description BLOOD LEFT HAND  Final   Special Requests   Final    BOTTLES DRAWN AEROBIC AND ANAEROBIC Blood Culture adequate volume Performed at [facility], [address] [place],  [address]    Culture PENDING  Incomplete   Report Status PENDING  Incomplete     Radiology Studies: DG Chest Port 1 View  Result Date: 06/11/2020 CLINICAL DATA:  Shortness of breath.  COVID positive. EXAM: PORTABLE CHEST 1 VIEW COMPARISON:  08/10/2019 FINDINGS:  Cardiomegaly, equivocally increased versus differences in technique. Unchanged mediastinal contours. Vague heterogeneous opacities in the mid lower lung zones. No pneumothorax or large pleural effusion. No acute osseous abnormalities are seen. Degenerative change of both shoulders. IMPRESSION: 1. Vague heterogeneous opacities in the mid lower lung zones, pattern consistent with COVID-19 pneumonia. 2. Cardiomegaly, equivocally increased from prior exam versus differences in technique. Electronically Signed   By: Keith Rake M.D.   On: 06/11/2020 18:22    Scheduled Meds: . amLODipine  10 mg Oral Daily  . vitamin C  500 mg Oral Daily  . aspirin  81 mg Oral Daily  . baricitinib  2 mg Oral Daily  . carvedilol  25 mg Oral BID WC  . docusate sodium  100 mg Oral BID  . DULoxetine  60 mg Oral Daily  . enoxaparin (LOVENOX) injection  50 mg Subcutaneous Q24H  . gabapentin  300 mg Oral QHS  . gabapentin  600 mg Oral BID  . insulin aspart  0-15 Units Subcutaneous TID WC  . insulin detemir  58 Units Subcutaneous Daily  . insulin detemir  62 Units Subcutaneous QHS  . isosorbide mononitrate  30 mg Oral QPM  . melatonin  9 mg Oral QHS  . methylPREDNISolone (SOLU-MEDROL) injection  50 mg Intravenous Q12H   Followed by  . [START ON 06/15/2020] predniSONE  50 mg Oral Daily  . pravastatin  80 mg Oral q1800  . sodium chloride flush  3 mL Intravenous Q12H  . sodium chloride flush  3 mL Intravenous Q12H  . zinc sulfate  220 mg Oral Daily   Continuous Infusions: . sodium chloride    . lactated ringers 100 mL/hr at 06/12/20 0654  . remdesivir 100 mg in NS 100 mL Stopped (06/12/20 1318)     LOS: 1 day    Time spent: 35 minutes    Barton Dubois, MD [facility]   To contact the attending provider between 7A-7P or the covering provider during after hours 7P-7A, please log into the web site [email or web address] and access using universal [place] password for that web site. If you do not have the  password, please call the hospital operator.  06/12/2020, 2:00 PM

## 2020-06-12 NOTE — ED Notes (Signed)
Pt asleep.

## 2020-06-12 NOTE — ED Notes (Signed)
Call to pharm  Per Oneal Grout  Med has not been to ED  Med delay on pharm   "remaking it now"

## 2020-06-13 ENCOUNTER — Encounter (HOSPITAL_COMMUNITY): Payer: Self-pay | Admitting: Internal Medicine

## 2020-06-13 ENCOUNTER — Other Ambulatory Visit: Payer: Self-pay

## 2020-06-13 DIAGNOSIS — U071 COVID-19: Secondary | ICD-10-CM | POA: Diagnosis not present

## 2020-06-13 DIAGNOSIS — J9601 Acute respiratory failure with hypoxia: Secondary | ICD-10-CM | POA: Diagnosis present

## 2020-06-13 DIAGNOSIS — E6609 Other obesity due to excess calories: Secondary | ICD-10-CM | POA: Diagnosis not present

## 2020-06-13 DIAGNOSIS — M5441 Lumbago with sciatica, right side: Secondary | ICD-10-CM | POA: Diagnosis not present

## 2020-06-13 DIAGNOSIS — N179 Acute kidney failure, unspecified: Secondary | ICD-10-CM | POA: Diagnosis not present

## 2020-06-13 LAB — CBC WITH DIFFERENTIAL/PLATELET
Abs Immature Granulocytes: 0.06 10*3/uL (ref 0.00–0.07)
Basophils Absolute: 0 10*3/uL (ref 0.0–0.1)
Basophils Relative: 0 %
Eosinophils Absolute: 0 10*3/uL (ref 0.0–0.5)
Eosinophils Relative: 0 %
HCT: 40.9 % (ref 39.0–52.0)
Hemoglobin: 11.9 g/dL — ABNORMAL LOW (ref 13.0–17.0)
Immature Granulocytes: 1 %
Lymphocytes Relative: 19 %
Lymphs Abs: 2 10*3/uL (ref 0.7–4.0)
MCH: 28.2 pg (ref 26.0–34.0)
MCHC: 29.1 g/dL — ABNORMAL LOW (ref 30.0–36.0)
MCV: 96.9 fL (ref 80.0–100.0)
Monocytes Absolute: 1.2 10*3/uL — ABNORMAL HIGH (ref 0.1–1.0)
Monocytes Relative: 11 %
Neutro Abs: 7.3 10*3/uL (ref 1.7–7.7)
Neutrophils Relative %: 69 %
Platelets: 216 10*3/uL (ref 150–400)
RBC: 4.22 MIL/uL (ref 4.22–5.81)
RDW: 18.5 % — ABNORMAL HIGH (ref 11.5–15.5)
WBC: 10.5 10*3/uL (ref 4.0–10.5)
nRBC: 0 % (ref 0.0–0.2)

## 2020-06-13 LAB — C-REACTIVE PROTEIN: CRP: 7.6 mg/dL — ABNORMAL HIGH (ref <1.0)

## 2020-06-13 LAB — POTASSIUM
Potassium: 5.4 mmol/L — ABNORMAL HIGH (ref 3.5–5.1)
Potassium: 5.2 mmol/L — ABNORMAL HIGH (ref 3.5–5.1)

## 2020-06-13 LAB — COMPREHENSIVE METABOLIC PANEL
ALT: 23 U/L (ref 0–44)
AST: 56 U/L — ABNORMAL HIGH (ref 15–41)
Albumin: 3.2 g/dL — ABNORMAL LOW (ref 3.5–5.0)
Alkaline Phosphatase: 49 U/L (ref 38–126)
Anion gap: 11 (ref 5–15)
BUN: 37 mg/dL — ABNORMAL HIGH (ref 8–23)
CO2: 23 mmol/L (ref 22–32)
Calcium: 9.2 mg/dL (ref 8.9–10.3)
Chloride: 109 mmol/L (ref 98–111)
Creatinine, Ser: 1.14 mg/dL (ref 0.61–1.24)
GFR, Estimated: >60 mL/min (ref >60)
Glucose, Bld: 330 mg/dL — ABNORMAL HIGH (ref 70–99)
Potassium: 6.5 mmol/L (ref 3.5–5.1)
Sodium: 143 mmol/L (ref 135–145)
Total Bilirubin: 0.4 mg/dL (ref 0.3–1.2)
Total Protein: 7.1 g/dL (ref 6.5–8.1)

## 2020-06-13 LAB — CBG MONITORING, ED
Glucose-Capillary: 284 mg/dL — ABNORMAL HIGH (ref 70–99)
Glucose-Capillary: 335 mg/dL — ABNORMAL HIGH (ref 70–99)

## 2020-06-13 LAB — GLUCOSE, CAPILLARY
Glucose-Capillary: 266 mg/dL — ABNORMAL HIGH (ref 70–99)
Glucose-Capillary: 311 mg/dL — ABNORMAL HIGH (ref 70–99)

## 2020-06-13 LAB — PHOSPHORUS: Phosphorus: 3.5 mg/dL (ref 2.5–4.6)

## 2020-06-13 LAB — FERRITIN: Ferritin: 282 ng/mL (ref 24–336)

## 2020-06-13 LAB — D-DIMER, QUANTITATIVE: D-Dimer, Quant: 0.8 ug/mL-FEU — ABNORMAL HIGH (ref 0.00–0.50)

## 2020-06-13 LAB — MAGNESIUM: Magnesium: 2.2 mg/dL (ref 1.7–2.4)

## 2020-06-13 MED ORDER — SODIUM BICARBONATE 8.4 % IV SOLN: 50.0000 meq | Freq: Once | INTRAVENOUS | Status: AC

## 2020-06-13 MED ORDER — INSULIN DETEMIR 100 UNIT/ML ~~LOC~~ SOLN
65.0000 [IU] | Freq: Every day | SUBCUTANEOUS | Status: DC
  Administered 2020-06-13 – 2020-06-14 (×2): 65 [IU] via SUBCUTANEOUS
  Filled 2020-06-13 (×4): qty 0.65

## 2020-06-13 MED ORDER — SODIUM BICARBONATE 8.4 % IV SOLN
INTRAVENOUS | Status: AC
  Administered 2020-06-13: 13:00:00 50 meq via INTRAVENOUS
  Filled 2020-06-13: qty 50

## 2020-06-13 MED ORDER — INSULIN ASPART 100 UNIT/ML ~~LOC~~ SOLN
5.0000 [IU] | Freq: Three times a day (TID) | SUBCUTANEOUS | Status: DC
  Administered 2020-06-13 – 2020-06-15 (×7): 5 [IU] via SUBCUTANEOUS

## 2020-06-13 MED ORDER — INSULIN DETEMIR 100 UNIT/ML ~~LOC~~ SOLN
62.0000 [IU] | Freq: Every day | SUBCUTANEOUS | Status: DC
  Administered 2020-06-14 – 2020-06-15 (×2): 62 [IU] via SUBCUTANEOUS
  Filled 2020-06-13 (×4): qty 0.62

## 2020-06-13 MED ORDER — BARICITINIB 2 MG PO TABS
4.0000 mg | ORAL_TABLET | Freq: Every day | ORAL | Status: DC
  Administered 2020-06-13 – 2020-06-15 (×3): 4 mg via ORAL
  Filled 2020-06-13 (×4): qty 2

## 2020-06-13 MED ORDER — SODIUM ZIRCONIUM CYCLOSILICATE 10 G PO PACK
10.0000 g | PACK | Freq: Two times a day (BID) | ORAL | Status: AC
  Administered 2020-06-13 – 2020-06-14 (×4): 10 g via ORAL
  Filled 2020-06-13 (×3): qty 1
  Filled 2020-06-13: qty 2
  Filled 2020-06-13: qty 1

## 2020-06-13 MED ORDER — INSULIN ASPART 100 UNIT/ML IV SOLN
10.0000 [IU] | Freq: Once | INTRAVENOUS | Status: AC
  Administered 2020-06-13: 13:00:00 10 [IU] via INTRAVENOUS

## 2020-06-13 NOTE — TOC Initial Note (Signed)
Transition of Care [facility]) - Initial/Assessment Note    Patient Details  Name: Francisco Powell MRN: [id number] Date of Birth: [birth year]  [facility] CM/SW Contact:    Villa Herb, LCSWA Phone Number: 06/13/2020, 8:59 PM  Clinical Narrative:                 Pt admitted for Acute hypoxemic respiratory failure due to COVID-19. Pt high risk for readmission. CSW spoke with pts daughter [place] to complete assessment. Pt lives alone but daughter and son live on same road and pt has paid care givers in the home daily. Pt has transportation to appointments and can also do virtual visits. Pt has assistance with ADLs. Pts daughter has been primary caregiver for the last 4 and a half years. Per Ms. Arlana Pouch the family understands pts limitations and would rather not have pt go to SNF, would rather bring pt home. Pt has had HH in the past. Pt has hospital bed, wheelchair, walker, lift chair, traditional sized bsc (4 years). Ms. Arlana Pouch would like to know if pt would meet requirements for bariatric bsc. TOC to follow.   Expected Discharge Plan: Home/Self Care Barriers to Discharge: Continued Medical Work up   Patient Goals and CMS Choice Patient states their goals for this hospitalization and ongoing recovery are:: Return home with family supports   Choice offered to / list presented to : NA  Expected Discharge Plan and Services Expected Discharge Plan: Home/Self Care In-house Referral: NA Discharge Planning Services: NA Post Acute Care Choice: NA Living arrangements for the past 2 months: Single Family Home                           HH Arranged: NA HH Agency: NA        Prior Living Arrangements/Services Living arrangements for the past 2 months: Single Family Home Lives with:: Self Patient language and need for interpreter reviewed:: Yes Do you feel safe going back to the place where you live?: Yes      Need for Family Participation in Patient Care: Yes (Comment) Care  giver support system in place?: Yes (comment) Current home services: Other (comment) (home care givers) Criminal Activity/Legal Involvement Pertinent to Current Situation/Hospitalization: No - Comment as needed  Activities of Daily Living Home Assistive Devices/Equipment: Wheelchair ADL Screening (condition at time of admission) Patient's cognitive ability adequate to safely complete daily activities?: No Is the patient deaf or have difficulty hearing?: No Does the patient have difficulty seeing, even when wearing glasses/contacts?: No Does the patient have difficulty concentrating, remembering, or making decisions?: Yes Patient able to express need for assistance with ADLs?: No Does the patient have difficulty dressing or bathing?: Yes Independently performs ADLs?: No Communication: Independent Dressing (OT): Dependent Is this a change from baseline?: Pre-admission baseline Grooming: Dependent Is this a change from baseline?: Pre-admission baseline Feeding: Needs assistance Is this a change from baseline?: Pre-admission baseline Bathing: Dependent Is this a change from baseline?: Pre-admission baseline Toileting: Dependent Is this a change from baseline?: Pre-admission baseline In/Out Bed: Dependent Is this a change from baseline?: Pre-admission baseline Walks in Home: Dependent Is this a change from baseline?: Pre-admission baseline Does the patient have difficulty walking or climbing stairs?: Yes Weakness of Legs: Both Weakness of Arms/Hands: Both  Permission Sought/Granted                  Emotional Assessment Appearance:: Appears stated age Attitude/Demeanor/Rapport: Unable to  Assess Affect (typically observed): Unable to Assess Orientation: : Fluctuating Orientation (Suspected and/or reported Sundowners) Alcohol / Substance Use: Not Applicable Psych Involvement: No (comment)  Admission diagnosis:  Hyperkalemia [E87.5] Acute respiratory failure with hypoxia  (HCC) [J96.01] AKI (acute kidney injury) (HCC) [N17.9] Acute hypoxemic respiratory failure due to COVID-19 (HCC) [U07.1, J96.01] Patient Active Problem List   Diagnosis Date Noted   Acute respiratory failure with hypoxia (HCC) 06/13/2020   Acute hypoxemic respiratory failure due to COVID-19 (HCC) 06/11/2020   Diabetic peripheral neuropathy (HCC) 08/22/2019   Accelerated hypertension    Coronary artery disease    Cardiac arrhythmia    Uncontrolled type 2 diabetes mellitus with hyperglycemia (HCC) 08/10/2019   Major depression in remission (HCC) 10/07/2018   Class 1 obesity due to excess calories with body mass index (BMI) of 32.0 to 32.9 in adult 10/07/2018   Chest pain 07/24/2017   Depression, major, single episode, in partial remission (HCC) 10/01/2016   Diarrhea 04/20/2016   Fall at home 03/30/2016   Generalized weakness 03/30/2016   Hypokalemia 03/30/2016   Chronic low back pain 03/30/2016   Right leg weakness 03/18/2016   Insulin dependent diabetes mellitus 08/15/2015   History of colonic polyps    Hx of adenomatous colonic polyps 04/04/2015   Elevated PSA 09/04/2014   Adenomatous polyps 03/06/2014   FH: colon cancer 03/06/2014   Helicobacter pylori (H. pylori) infection 03/06/2014   Depression 01/14/2014   Coronary atherosclerosis of native coronary artery 11/11/2013   Bifascicular block - RBBB, LAFB 11/10/2013   Essential hypertension 11/10/2013   Hyperlipidemia 10/31/2008   PCP:  Babs Sciara, MD Pharmacy:   [facility] - [place], [place] - [address] [place], [address] [place], [address] [place] [address] Phone: [phone] Fax: [phone]     Social Determinants of Health (SDOH) Interventions    Readmission Risk Interventions Readmission Risk Prevention Plan 06/13/2020  Transportation Screening Complete  HRI or Home Care Consult Complete  Social Work Consult for Recovery Care Planning/Counseling  Complete  Palliative Care Screening Not Applicable  Medication Review Oceanographer) Complete  Some recent data might be hidden

## 2020-06-13 NOTE — ED Notes (Signed)
Date and time results received: 06/13/20 0650   Test: Potassium  Critical Value: 6.5  Name of Provider Notified: A[place]o  Orders Received? Or Actions Taken?: none yet

## 2020-06-13 NOTE — Progress Notes (Signed)
PROGRESS NOTE    Francisco Powell  MHD:[id number] DOB: December 28, 1940 DOA: 06/11/2020 PCP: Babs Sciara, MD   Chief Complaint  Patient presents with  . Covid Positive    Brief Narrative:  As per H&P written by Dr. Ophelia Charter on 06/11/2020 Francisco Powell is a 79 y.o. male with medical history significant of obesity (BMI 32.28); CAD; RCC; HLD; HTN; and DM presenting with worsening COVID symptoms. He started with SOB and cough on 12/26.  He was vaccinated but not boosted.  Home test was positive and he had COVID exposures with multiple family members.  Over the last day, he became increasingly confused with sats in the 80s and so was sent in.   I spoke with his daughter, who is a Engineer, civil (consulting).  He stays alone at night; his children both live on the same road.  They put him to bed and get him up.  He transfers to wheelchair/recliner but no longer walks.  Has Life Alert.  He told his sister last week that he was ready to "go home... to see Jesus."  He has ACP documents in the chart including DNR.     ED Course:  COVID, sick on 12/26.  Vaccinated but not boosted.  Confused, SOB, cough, less active.  80s, on 2-3 L [place] O2.  Assessment & Plan: 1-acute hypoxemic respiratory failure due to COVID-19 (HCC) -Worsening inflammatory markers appreciated -Requiring over 6 L oxygen supplementation -Short of breath, short winded sensation and unable to speak in full sentences on physical examination. -Continue the use of remdesivir and steroids -Continue treatment with baricitinib  -Continue bronchodilators and supportive care -Continue the use of flutter valve and incentive spirometer. -Continue to wean oxygen supplementation as tolerated.  2-acute kidney injury -In the setting of prerenal azotemia most likely -Continue to see improvement in creatinine trend. -Continue adjusted rate of fluid resuscitation -Continue to follow renal function -Continue to hold/avoid nephrotoxic agents.  3-type 2 diabetes with  hyperglycemia -Recent A1c 7.3 -Hyperglycemia appears to be in the setting of his steroids usage. -Continue the use of sliding scale insulin and Levemir; dose adjusted for better CBG control -NovoLog for meal coverage has been added. -Continue holding Metformin and Ozempic while inpatient.  4-Hyperlipidemia -Continue statins.  5-Essential hypertension/coronary artery disease -No chest pain -Continue the use of Coreg and amlodipine -Will also continue aspirin and Imdur -Continue telemetry monitoring.  6-hyperkalemia -No acute abnormalities appreciated on telemetry or EKG -Potassium up to 6.5 today -Treatment with sodium bicarbonate, insulin and Lokelma has been provided. -Follow potassium in a.m.  7-Depression -Overall stable -Continue the use of Xanax and Cymbalta -No suicidal ideation or hallucination.  8-class I obesity -Body mass index is 32.28 kg/m. -Low calorie diet and portion control has been discussed with patient.  9-chronic back pain and debility -Patient with prior history of multiple back surgeries resulting in immobility. -Patient no longer ambulatory at baseline -Continue the use of Neurontin and home analgesics  10- 1 out of 2 positive blood culture -Appears to be contaminant -Will wait on speciation; continue holding antibiotics for now.  11-DNR -Patient wishes will be respected -Overall condition is/outcome is guarded currently -Continue to treat what is treatable -Noninvasive or life support interventions desired.   DVT prophylaxis: Lovenox Code Status: DNR Family Communication: No family at bedside. Disposition:   Status is: Inpatient  Dispo: The patient is from: Home              Anticipated d/c is to: To be determined;  but hopefully home.              Anticipated d/c date is: 2-3 days              Patient currently no medically stable for discharge; patient is a still with significant shortness of breath on minimal exertion, requiring over  2-3 L oxygen supplementation and having difficulty speaking in full sentences. Positive tachypnea with activity has been documented.  Continue current treatment with the use of baricitinib, steroids and remdesivir.  Will adjust insulin therapy for ongoing hyperglycemia due to steroids treatment.  Provide medications for hyperkalemia and follow electrolytes trend.    Consultants:  None    Procedures:  See below for x-ray report   Antimicrobials/antiviral Remdesivir day 3/5   Subjective: Patient reporting significant improvement in his breathing and feeling better.  Still short of breath and easily short winded requiring 2-3 L nasal cannula supplementation.  Found to be hyperglycemic and hyperkalemic on today's blood work  Objective: Vitals:   06/13/20 1045 06/13/20 1230 06/13/20 1540 06/13/20 1552  BP: (!) 148/65 139/65 137/71 124/63  Pulse: (!) 48 (!) 51 (!) 50 (!) 50  Resp: 19 20 20 16   Temp:   97.8 F (36.6 C)   TempSrc:   Oral   SpO2: 96% 98% 98% 98%  Weight:      Height:        Intake/Output Summary (Last 24 hours) at 06/13/2020 1601 Last data filed at 06/13/2020 1234 Gross per 24 hour  Intake 196.93 ml  Output --  Net 196.93 ml   Filed Weights   06/11/20 1740  Weight: 108 kg    Examination: General exam: Alert, awake, oriented x 3; reporting improvement in his breathing and currently on 2-3 L nasal cannula supplementation.  Patient denies chest pain, palpitations, nausea or vomiting.  Still short winded with minimal exertion. Respiratory system: Positive scattered rhonchi, no using accessory muscle.  No wheezing or crackles on exam. Cardiovascular system:RRR. No murmurs, rubs, gallops.  Unable to properly assess JVD with body habitus. Gastrointestinal system: Abdomen is obese, nondistended, soft and nontender. No organomegaly or masses felt. Normal bowel sounds heard. Central nervous system: Alert and oriented. No focal neurological deficits. Extremities: No  cyanosis or clubbing. Skin: No rashes, no petechiae. Psychiatry: Mood & affect appropriate.    Data Reviewed: I have personally reviewed following labs and imaging studies  CBC: Recent Labs  Lab 06/11/20 1750 06/12/20 0450 06/13/20 0545  WBC 9.0 9.5 10.5  NEUTROABS 5.5 7.8* 7.3  HGB 11.5* 12.4* 11.9*  HCT 39.3 41.5 40.9  MCV 97.5 96.5 96.9  PLT 187 190 676    Basic Metabolic Panel: Recent Labs  Lab 06/11/20 1750 06/12/20 0450 06/13/20 0545 06/13/20 1348  NA 140 139 143  --   K 5.3* 5.8* 6.5* 5.4*  CL 114* 110 109  --   CO2 19* 18* 23  --   GLUCOSE 81 296* 330*  --   BUN 32* 33* 37*  --   CREATININE 1.75* 1.42* 1.14  --   CALCIUM 8.1* 8.6* 9.2  --   MG  --  2.0 2.2  --   PHOS  --  4.3 3.5  --     GFR: Estimated Creatinine Clearance: 68.9 mL/min (by C-G formula based on SCr of 1.14 mg/dL).  Liver Function Tests: Recent Labs  Lab 06/11/20 1750 06/12/20 0450 06/13/20 0545  AST 23 24 56*  ALT 16 17 23   ALKPHOS 47 57 49  BILITOT 0.5 0.5 0.4  PROT 6.8 7.3 7.1  ALBUMIN 3.3* 3.4* 3.2*    CBG: Recent Labs  Lab 06/12/20 0848 06/12/20 1225 06/12/20 1735 06/13/20 0850 06/13/20 1229  GLUCAP 268* 280* 261* 284* 335*     Recent Results (from the past 240 hour(s))  Blood Culture (routine x 2)     Status: Abnormal (Preliminary result)   Collection Time: 06/11/20  5:50 PM   Specimen: BLOOD RIGHT HAND  Result Value Ref Range Status   Specimen Description   Final    BLOOD RIGHT HAND Performed at [facility], [address], [place], [place] [address]    Special Requests   Final    BOTTLES DRAWN AEROBIC AND ANAEROBIC Blood Culture adequate volume Performed at [facility], [address] [place], [place] [address]    Culture  Setup Time   Final    GRAM POSITIVE COCCI IN BOTH AEROBIC AND ANAEROBIC BOTTLES Gram Stain Report Called to,Read Back By and Verified With: WHITE,M@1328  by MATTHEWS,B 12.29.21  [facility] Organism ID to follow CRITICAL RESULT CALLED  TO, READ BACK BY AND VERIFIED WITH: T EASTER RN 12/29/21147 JDW    Culture (A)  Final    STAPHYLOCOCCUS HOMINIS THE SIGNIFICANCE OF ISOLATING THIS ORGANISM FROM A SINGLE SET OF BLOOD CULTURES WHEN MULTIPLE SETS ARE DRAWN IS UNCERTAIN. PLEASE NOTIFY THE MICROBIOLOGY DEPARTMENT WITHIN ONE WEEK IF SPECIATION AND SENSITIVITIES ARE REQUIRED. Performed at [facility], [address], [place], [place] [address]    Report Status PENDING  Incomplete  Blood Culture (routine x 2)     Status: None (Preliminary result)   Collection Time: 06/11/20  5:50 PM   Specimen: BLOOD LEFT HAND  Result Value Ref Range Status   Specimen Description BLOOD LEFT HAND  Final   Special Requests   Final    BOTTLES DRAWN AEROBIC AND ANAEROBIC Blood Culture adequate volume   Culture   Final    NO GROWTH 2 DAYS Performed at [facility], [address] [place], [place] [address]    Report Status PENDING  Incomplete  Blood Culture ID Panel (Reflexed)     Status: Abnormal   Collection Time: 06/11/20  5:50 PM  Result Value Ref Range Status   Enterococcus faecalis NOT DETECTED NOT DETECTED Final   Enterococcus Faecium NOT DETECTED NOT DETECTED Final   Listeria monocytogenes NOT DETECTED NOT DETECTED Final   Staphylococcus species DETECTED (A) NOT DETECTED Final    Comment: CRITICAL RESULT CALLED TO, READ BACK BY AND VERIFIED WITH: T EASTER RN 06/12/20 2147 JDW    Staphylococcus aureus (BCID) NOT DETECTED NOT DETECTED Final   Staphylococcus epidermidis NOT DETECTED NOT DETECTED Final   Staphylococcus lugdunensis NOT DETECTED NOT DETECTED Final   Streptococcus species NOT DETECTED NOT DETECTED Final   Streptococcus agalactiae NOT DETECTED NOT DETECTED Final   Streptococcus pneumoniae NOT DETECTED NOT DETECTED Final   Streptococcus pyogenes NOT DETECTED NOT DETECTED Final   A.calcoaceticus-baumannii NOT DETECTED NOT DETECTED Final   Bacteroides fragilis NOT DETECTED NOT DETECTED Final   Enterobacterales NOT  DETECTED NOT DETECTED Final   Enterobacter cloacae complex NOT DETECTED NOT DETECTED Final   Escherichia coli NOT DETECTED NOT DETECTED Final   Klebsiella aerogenes NOT DETECTED NOT DETECTED Final   Klebsiella oxytoca NOT DETECTED NOT DETECTED Final   Klebsiella pneumoniae NOT DETECTED NOT DETECTED Final   Proteus species NOT DETECTED NOT DETECTED Final   Salmonella species NOT DETECTED NOT DETECTED Final   Serratia marcescens NOT  DETECTED NOT DETECTED Final   Haemophilus influenzae NOT DETECTED NOT DETECTED Final   Neisseria meningitidis NOT DETECTED NOT DETECTED Final   Pseudomonas aeruginosa NOT DETECTED NOT DETECTED Final   Stenotrophomonas maltophilia NOT DETECTED NOT DETECTED Final   Candida albicans NOT DETECTED NOT DETECTED Final   Candida auris NOT DETECTED NOT DETECTED Final   Candida glabrata NOT DETECTED NOT DETECTED Final   Candida krusei NOT DETECTED NOT DETECTED Final   Candida parapsilosis NOT DETECTED NOT DETECTED Final   Candida tropicalis NOT DETECTED NOT DETECTED Final   Cryptococcus neoformans/gattii NOT DETECTED NOT DETECTED Final    Comment: Performed at [facility], [address], [place], [place] [address]     Radiology Studies: DG Chest Port 1 View  Result Date: 06/11/2020 CLINICAL DATA:  Shortness of breath.  COVID positive. EXAM: PORTABLE CHEST 1 VIEW COMPARISON:  08/10/2019 FINDINGS: Cardiomegaly, equivocally increased versus differences in technique. Unchanged mediastinal contours. Vague heterogeneous opacities in the mid lower lung zones. No pneumothorax or large pleural effusion. No acute osseous abnormalities are seen. Degenerative change of both shoulders. IMPRESSION: 1. Vague heterogeneous opacities in the mid lower lung zones, pattern consistent with COVID-19 pneumonia. 2. Cardiomegaly, equivocally increased from prior exam versus differences in technique. Electronically Signed   By: Narda Rutherford M.D.   On: 06/11/2020 18:22    Scheduled  Meds: . amLODipine  10 mg Oral Daily  . vitamin C  500 mg Oral Daily  . aspirin  81 mg Oral Daily  . baricitinib  4 mg Oral Daily  . carvedilol  25 mg Oral BID WC  . docusate sodium  100 mg Oral BID  . DULoxetine  60 mg Oral Daily  . enoxaparin (LOVENOX) injection  50 mg Subcutaneous Q24H  . gabapentin  300 mg Oral QHS  . gabapentin  600 mg Oral BID  . insulin aspart  0-15 Units Subcutaneous TID WC  . insulin aspart  5 Units Subcutaneous TID WC  . [START ON 06/14/2020] insulin detemir  62 Units Subcutaneous Daily  . insulin detemir  65 Units Subcutaneous QHS  . isosorbide mononitrate  30 mg Oral QPM  . melatonin  9 mg Oral QHS  . methylPREDNISolone (SOLU-MEDROL) injection  50 mg Intravenous Q12H   Followed by  . [START ON 06/15/2020] predniSONE  50 mg Oral Daily  . pravastatin  80 mg Oral q1800  . sodium chloride flush  3 mL Intravenous Q12H  . sodium chloride flush  3 mL Intravenous Q12H  . sodium zirconium cyclosilicate  10 g Oral BID  . zinc sulfate  220 mg Oral Daily   Continuous Infusions: . sodium chloride    . lactated ringers 75 mL/hr at 06/13/20 1254  . remdesivir 100 mg in NS 100 mL Stopped (06/13/20 1234)     LOS: 2 days    Time spent: 35 minutes    Vassie Loll, MD [facility]   To contact the attending provider between 7A-7P or the covering provider during after hours 7P-7A, please log into the web site [email or web address] and access using universal [place] password for that web site. If you do not have the password, please call the hospital operator.  06/13/2020, 4:01 PM

## 2020-06-13 NOTE — Progress Notes (Signed)
Inpatient Diabetes Program Recommendations  AACE/ADA: New Consensus Statement on Inpatient Glycemic Control (2015)  Target Ranges:  Prepandial:   less than 140 mg/dL      Peak postprandial:   less than 180 mg/dL (1-2 hours)      Critically ill patients:  140 - 180 mg/dL   Lab Results  Component Value Date   GLUCAP 284 (H) 06/13/2020   HGBA1C 7.3 (H) 04/09/2020    Review of Glycemic Control Results for Francisco Powell, Francisco Powell" (MRN [id number]) as of 06/13/2020 09:24  Ref. Range 06/11/2020 22:50 06/12/2020 08:48 06/12/2020 12:25 06/12/2020 17:35 06/13/2020 08:50  Glucose-Capillary Latest Ref Range: 70 - 99 mg/dL 932 (H) 671 (H) 245 (H) 261 (H) 284 (H)   Diabetes history: DM2 Outpatient Diabetes medications:  Levemir 58 units am + 62 units pm + 0-15 units correction tid  Current orders for Inpatient glycemic control: Levemir 58 units am + 62 units pm + 0-15 units correction tid + Solumedrol 50 bid  Inpatient Diabetes Program Recommendations: Please consider while on steroids:   -Add Novolog 0-5 units hs correction -Novolog 4 units tid meal coverage if eats 50% Secure chat sent to Dr. Gwenlyn Perking.  Thank you, Francisco Powell. Francisco Accomando, RN, MSN, CDE  Diabetes Coordinator Inpatient Glycemic Control Team Team Pager [phone] (8am-5pm) 06/13/2020 9:35 AM

## 2020-06-14 DIAGNOSIS — M5441 Lumbago with sciatica, right side: Secondary | ICD-10-CM | POA: Diagnosis not present

## 2020-06-14 DIAGNOSIS — L8990 Pressure ulcer of unspecified site, unspecified stage: Secondary | ICD-10-CM | POA: Insufficient documentation

## 2020-06-14 DIAGNOSIS — N179 Acute kidney failure, unspecified: Secondary | ICD-10-CM | POA: Diagnosis not present

## 2020-06-14 DIAGNOSIS — U071 COVID-19: Secondary | ICD-10-CM | POA: Diagnosis not present

## 2020-06-14 DIAGNOSIS — E6609 Other obesity due to excess calories: Secondary | ICD-10-CM | POA: Diagnosis not present

## 2020-06-14 LAB — CBC WITH DIFFERENTIAL/PLATELET
Abs Immature Granulocytes: 0.06 10*3/uL (ref 0.00–0.07)
Basophils Absolute: 0 10*3/uL (ref 0.0–0.1)
Basophils Relative: 0 %
Eosinophils Absolute: 0 10*3/uL (ref 0.0–0.5)
Eosinophils Relative: 0 %
HCT: 37.9 % — ABNORMAL LOW (ref 39.0–52.0)
Hemoglobin: 11.7 g/dL — ABNORMAL LOW (ref 13.0–17.0)
Immature Granulocytes: 1 %
Lymphocytes Relative: 15 %
Lymphs Abs: 1.3 10*3/uL (ref 0.7–4.0)
MCH: 28.6 pg (ref 26.0–34.0)
MCHC: 30.9 g/dL (ref 30.0–36.0)
MCV: 92.7 fL (ref 80.0–100.0)
Monocytes Absolute: 0.8 10*3/uL (ref 0.1–1.0)
Monocytes Relative: 9 %
Neutro Abs: 6.7 10*3/uL (ref 1.7–7.7)
Neutrophils Relative %: 75 %
Platelets: 209 10*3/uL (ref 150–400)
RBC: 4.09 MIL/uL — ABNORMAL LOW (ref 4.22–5.81)
RDW: 18.2 % — ABNORMAL HIGH (ref 11.5–15.5)
WBC: 8.9 10*3/uL (ref 4.0–10.5)
nRBC: 0 % (ref 0.0–0.2)

## 2020-06-14 LAB — COMPREHENSIVE METABOLIC PANEL
ALT: 46 U/L — ABNORMAL HIGH (ref 0–44)
AST: 89 U/L — ABNORMAL HIGH (ref 15–41)
Albumin: 2.8 g/dL — ABNORMAL LOW (ref 3.5–5.0)
Alkaline Phosphatase: 42 U/L (ref 38–126)
Anion gap: 8 (ref 5–15)
BUN: 36 mg/dL — ABNORMAL HIGH (ref 8–23)
CO2: 24 mmol/L (ref 22–32)
Calcium: 8.6 mg/dL — ABNORMAL LOW (ref 8.9–10.3)
Chloride: 107 mmol/L (ref 98–111)
Creatinine, Ser: 0.99 mg/dL (ref 0.61–1.24)
GFR, Estimated: >60 mL/min (ref >60)
Glucose, Bld: 256 mg/dL — ABNORMAL HIGH (ref 70–99)
Potassium: 4.7 mmol/L (ref 3.5–5.1)
Sodium: 139 mmol/L (ref 135–145)
Total Bilirubin: 0.4 mg/dL (ref 0.3–1.2)
Total Protein: 6.3 g/dL — ABNORMAL LOW (ref 6.5–8.1)

## 2020-06-14 LAB — GLUCOSE, CAPILLARY
Glucose-Capillary: 234 mg/dL — ABNORMAL HIGH (ref 70–99)
Glucose-Capillary: 237 mg/dL — ABNORMAL HIGH (ref 70–99)
Glucose-Capillary: 223 mg/dL — ABNORMAL HIGH (ref 70–99)
Glucose-Capillary: 244 mg/dL — ABNORMAL HIGH (ref 70–99)

## 2020-06-14 LAB — POTASSIUM: Potassium: 5 mmol/L (ref 3.5–5.1)

## 2020-06-14 LAB — FERRITIN: Ferritin: 275 ng/mL (ref 24–336)

## 2020-06-14 LAB — CULTURE, BLOOD (ROUTINE X 2): Special Requests: ADEQUATE

## 2020-06-14 LAB — PHOSPHORUS: Phosphorus: 2.4 mg/dL — ABNORMAL LOW (ref 2.5–4.6)

## 2020-06-14 LAB — C-REACTIVE PROTEIN: CRP: 2.1 mg/dL — ABNORMAL HIGH (ref <1.0)

## 2020-06-14 LAB — MAGNESIUM: Magnesium: 2 mg/dL (ref 1.7–2.4)

## 2020-06-14 LAB — D-DIMER, QUANTITATIVE: D-Dimer, Quant: 0.85 ug/mL-FEU — ABNORMAL HIGH (ref 0.00–0.50)

## 2020-06-14 NOTE — Care Management Important Message (Signed)
Important Message  Patient Details  Name: SYLVESTRE RATHGEBER MRN: [id number] Date of Birth: 10-30-1940   Medicare Important Message Given:  Yes - Important Message mailed due to current National Emergency     Corey Harold 06/14/2020, 3:42 PM

## 2020-06-14 NOTE — Progress Notes (Signed)
PROGRESS NOTE    Francisco Powell  [id number] DOB: 1940-07-27 DOA: 06/11/2020 PCP: [name], MD   Chief Complaint  Patient presents with  . Covid Positive    Brief Narrative:  As per H&P written by Dr. Lorin Mercy on 06/11/2020 Francisco Powell is a 79 y.o. male with medical history significant of obesity (BMI 32.28); CAD; RCC; HLD; HTN; and DM presenting with worsening COVID symptoms. He started with SOB and cough on 12/26.  He was vaccinated but not boosted.  Home test was positive and he had COVID exposures with multiple family members.  Over the last day, he became increasingly confused with sats in the 80s and so was sent in.   I spoke with his daughter, who is a [employer] scientist.  He stays alone at night; his children both live on the same road.  They put him to bed and get him up.  He transfers to wheelchair/recliner but no longer walks.  Has Life Alert.  He told his sister last week that he was ready to "go home... to see Jesus."  He has ACP documents in the chart including DNR.     ED Course:  COVID, sick on 12/26.  Vaccinated but not boosted.  Confused, SOB, cough, less active.  80s, on 2-3 L [place] O2.  Assessment & Plan: 1-acute hypoxemic respiratory failure due to COVID-19 [facility]) -Reports feeling short winded with activity and expressed intermittent coughing spells; no requiring oxygen supplementation currently. -Continue the use of remdesivir and steroids -Continue treatment with baricitinib  -Continue bronchodilators and supportive care -Continue the use of flutter valve and incentive spirometer. -Continue to wean oxygen supplementation as tolerated. -Continue to follow inflammatory markers -Possible discharge in the next 24 to 48 hours.  2-acute kidney injury -In the setting of prerenal azotemia most likely -Continue to see improvement in creatinine trend. -Continue adjusted rate of fluid resuscitation -Continue to follow renal function -Continue to hold/avoid nephrotoxic  agents.  3-type 2 diabetes with hyperglycemia -Recent A1c 7.3 -Hyperglycemia appears to be in the setting of his steroids usage. -Continue the use of sliding scale insulin and Levemir; dose adjusted for better CBG control -Continue NovoLog meal coverage. -Continue holding Metformin and Ozempic while inpatient.  4-Hyperlipidemia -Continue statins.  5-Essential hypertension/coronary artery disease -No chest pain -Continue the use of Coreg and amlodipine -Will also continue aspirin and Imdur -Continue telemetry monitoring.  6-hyperkalemia -No acute abnormalities appreciated on telemetry or EKG -Potassium down to 5.0. -Continue to follow electrolytes trend.  7-Depression -Overall stable -Continue the use of Xanax and Cymbalta -No suicidal ideation or hallucination.  8-class I obesity -Body mass index is 32.28 kg/m. -Low calorie diet and portion control has been discussed with patient.  9-chronic back pain and debility -Patient with prior history of multiple back surgeries resulting in immobility. -Patient no longer ambulatory at baseline -Continue the use of Neurontin and home analgesics  10- 1 out of 2 positive blood culture -Appears to be contaminant -Will wait on speciation; continue holding antibiotics for now.  11-DNR -Patient wishes will be respected -Overall condition is/outcome is guarded currently -Continue to treat what is treatable -Noninvasive or life support interventions desired.   DVT prophylaxis: Lovenox Code Status: DNR Family Communication: No family at bedside. Disposition:   Status is: Inpatient  Dispo: The patient is from: Home              Anticipated d/c is to: To be determined; but hopefully home.  Anticipated d/c date is: 1-2 days              Patient currently no medically stable for discharge; patient is a still with significant shortness of breath on minimal exertion, no requiring oxygen supplementation at this time; still  with intermittent coughing spells and expressed feeling short winded with minimal activity. Continue current treatment with the use of baricitinib, steroids and remdesivir.  Will adjust insulin therapy for ongoing hyperglycemia due to steroids treatment.  Continue to follow electrolytes; potassium now within normal limits after yesterday intervention.    Consultants:  None    Procedures:  See below for x-ray report   Antimicrobials/antiviral Remdesivir day 4/5   Subjective: No chest pain, no nausea, no vomiting, no fever.  Reports further improvement in his breathing and currently is no requiring oxygen supplementation.  Objective: Vitals:   06/13/20 1830 06/13/20 1959 06/14/20 0624 06/14/20 1331  BP:  (!) 145/67 (!) 128/57 (!) 138/57  Pulse:  60 (!) 51 (!) 53  Resp:  16 19 19   Temp:  (!) 97.5 F (36.4 C) 97.6 F (36.4 C) 98 F (36.7 C)  TempSrc:    Oral  SpO2: 93% 92% 92% 91%  Weight:      Height:        Intake/Output Summary (Last 24 hours) at 06/14/2020 1520 Last data filed at 06/14/2020 1300 Gross per 24 hour  Intake 3056.98 ml  Output 2000 ml  Net 1056.98 ml   Filed Weights   06/11/20 1740  Weight: 108 kg    Examination: General exam: Alert, awake, oriented x 3; reporting no fever, no chest pain, no nausea, no vomiting, no palpitation.  Breathing has continued significantly improved and currently not requiring oxygen supplementation.  Patient is afebrile.  Still experiencing intermittent coughing spells. Respiratory system: Improved air movement bilaterally; no using accessory muscle.  No wheezing or crackles.  Positive rhonchi bilaterally. Cardiovascular system:RRR. No murmurs, rubs, gallops. Gastrointestinal system: Abdomen is nondistended, soft and nontender. No organomegaly or masses felt. Normal bowel sounds heard. Central nervous system: Alert and oriented. No focal neurological deficits. Extremities: No cyanosis or clubbing. Skin: No petechiae;  positive stage II buttocks pressure injury bilaterally (present on admission). Psychiatry: Mood & affect appropriate.    Data Reviewed: I have personally reviewed following labs and imaging studies  CBC: Recent Labs  Lab 06/11/20 1750 06/12/20 0450 06/13/20 0545 06/14/20 0820  WBC 9.0 9.5 10.5 8.9  NEUTROABS 5.5 7.8* 7.3 6.7  HGB 11.5* 12.4* 11.9* 11.7*  HCT 39.3 41.5 40.9 37.9*  MCV 97.5 96.5 96.9 92.7  PLT 187 190 216 [id number]    Basic Metabolic Panel: Recent Labs  Lab 06/11/20 1750 06/12/20 0450 06/13/20 0545 06/13/20 1348 06/13/20 2107 06/14/20 0120 06/14/20 0820  NA 140 139 143  --   --   --  139  K 5.3* 5.8* 6.5* 5.4* 5.2* 5.0 4.7  CL 114* 110 109  --   --   --  107  CO2 19* 18* 23  --   --   --  24  GLUCOSE 81 296* 330*  --   --   --  256*  BUN 32* 33* 37*  --   --   --  36*  CREATININE 1.75* 1.42* 1.14  --   --   --  0.99  CALCIUM 8.1* 8.6* 9.2  --   --   --  8.6*  MG  --  2.0 2.2  --   --   --  2.0  PHOS  --  4.3 3.5  --   --   --  2.4*    GFR: Estimated Creatinine Clearance: 79.4 mL/min (by C-G formula based on SCr of 0.99 mg/dL).  Liver Function Tests: Recent Labs  Lab 06/11/20 1750 06/12/20 0450 06/13/20 0545 06/14/20 0820  AST 23 24 56* 89*  ALT 16 17 23  46*  ALKPHOS 47 57 49 42  BILITOT 0.5 0.5 0.4 0.4  PROT 6.8 7.3 7.1 6.3*  ALBUMIN 3.3* 3.4* 3.2* 2.8*    CBG: Recent Labs  Lab 06/13/20 1229 06/13/20 1643 06/13/20 2102 06/14/20 0734 06/14/20 1111  GLUCAP 335* 266* 311* 234* 237*     Recent Results (from the past 240 hour(s))  Blood Culture (routine x 2)     Status: Abnormal   Collection Time: 06/11/20  5:50 PM   Specimen: BLOOD RIGHT HAND  Result Value Ref Range Status   Specimen Description   Final    BLOOD RIGHT HAND Performed at [facility], [address], [place], [place] [address]    Special Requests   Final    BOTTLES DRAWN AEROBIC AND ANAEROBIC Blood Culture adequate volume Performed at [facility], [address], [place], [place] [address]    Culture  Setup Time   Final    GRAM POSITIVE COCCI IN BOTH AEROBIC AND ANAEROBIC BOTTLES Gram Stain Report Called to,Read Back By and Verified With: WHITE,M@1328  by MATTHEWS,B 12.29.21  [facility] Organism ID to follow CRITICAL RESULT CALLED TO, READ BACK BY AND VERIFIED WITH: T EASTER RN 12/29/21147 JDW    Culture (A)  Final    STAPHYLOCOCCUS HOMINIS THE SIGNIFICANCE OF ISOLATING THIS ORGANISM FROM A SINGLE SET OF BLOOD CULTURES WHEN MULTIPLE SETS ARE DRAWN IS UNCERTAIN. PLEASE NOTIFY THE MICROBIOLOGY DEPARTMENT WITHIN ONE WEEK IF SPECIATION AND SENSITIVITIES ARE REQUIRED. Performed at [facility], [place] [address] [place], [place] [address]    Report Status 06/14/2020 FINAL  Final  Blood Culture (routine x 2)     Status: None (Preliminary result)   Collection Time: 06/11/20  5:50 PM   Specimen: BLOOD LEFT HAND  Result Value Ref Range Status   Specimen Description BLOOD LEFT HAND  Final   Special Requests   Final    BOTTLES DRAWN AEROBIC AND ANAEROBIC Blood Culture adequate volume   Culture   Final    NO GROWTH 3 DAYS Performed at [facility], [address] [place], [place] [address]    Report Status PENDING  Incomplete  Blood Culture ID Panel (Reflexed)     Status: Abnormal   Collection Time: 06/11/20  5:50 PM  Result Value Ref Range Status   Enterococcus faecalis NOT DETECTED NOT DETECTED Final   Enterococcus Faecium NOT DETECTED NOT DETECTED Final   Listeria monocytogenes NOT DETECTED NOT DETECTED Final   Staphylococcus species DETECTED (A) NOT DETECTED Final    Comment: CRITICAL RESULT CALLED TO, READ BACK BY AND VERIFIED WITH: T EASTER RN 06/12/20 2147 JDW    Staphylococcus aureus (BCID) NOT DETECTED NOT DETECTED Final   Staphylococcus epidermidis NOT DETECTED NOT DETECTED Final   Staphylococcus lugdunensis NOT DETECTED NOT DETECTED Final   Streptococcus species NOT DETECTED NOT DETECTED Final   Streptococcus agalactiae NOT DETECTED NOT  DETECTED Final   Streptococcus pneumoniae NOT DETECTED NOT DETECTED Final   Streptococcus pyogenes NOT DETECTED NOT DETECTED Final   A.calcoaceticus-baumannii NOT DETECTED NOT DETECTED Final   Bacteroides fragilis NOT DETECTED NOT DETECTED Final   Enterobacterales NOT  DETECTED NOT DETECTED Final   Enterobacter cloacae complex NOT DETECTED NOT DETECTED Final   Escherichia coli NOT DETECTED NOT DETECTED Final   Klebsiella aerogenes NOT DETECTED NOT DETECTED Final   Klebsiella oxytoca NOT DETECTED NOT DETECTED Final   Klebsiella pneumoniae NOT DETECTED NOT DETECTED Final   Proteus species NOT DETECTED NOT DETECTED Final   Salmonella species NOT DETECTED NOT DETECTED Final   Serratia marcescens NOT DETECTED NOT DETECTED Final   Haemophilus influenzae NOT DETECTED NOT DETECTED Final   Neisseria meningitidis NOT DETECTED NOT DETECTED Final   Pseudomonas aeruginosa NOT DETECTED NOT DETECTED Final   Stenotrophomonas maltophilia NOT DETECTED NOT DETECTED Final   Candida albicans NOT DETECTED NOT DETECTED Final   Candida auris NOT DETECTED NOT DETECTED Final   Candida glabrata NOT DETECTED NOT DETECTED Final   Candida krusei NOT DETECTED NOT DETECTED Final   Candida parapsilosis NOT DETECTED NOT DETECTED Final   Candida tropicalis NOT DETECTED NOT DETECTED Final   Cryptococcus neoformans/gattii NOT DETECTED NOT DETECTED Final    Comment: Performed at [facility], [address], [place], [place] [address]     Radiology Studies: No results found.  Scheduled Meds: . amLODipine  10 mg Oral Daily  . vitamin C  500 mg Oral Daily  . aspirin  81 mg Oral Daily  . baricitinib  4 mg Oral Daily  . carvedilol  25 mg Oral BID WC  . docusate sodium  100 mg Oral BID  . DULoxetine  60 mg Oral Daily  . enoxaparin (LOVENOX) injection  50 mg Subcutaneous Q24H  . gabapentin  300 mg Oral QHS  . gabapentin  600 mg Oral BID  . insulin aspart  0-15 Units Subcutaneous TID WC  . insulin aspart  5 Units  Subcutaneous TID WC  . insulin detemir  62 Units Subcutaneous Daily  . insulin detemir  65 Units Subcutaneous QHS  . isosorbide mononitrate  30 mg Oral QPM  . melatonin  9 mg Oral QHS  . methylPREDNISolone (SOLU-MEDROL) injection  50 mg Intravenous Q12H   Followed by  . [START ON 06/15/2020] predniSONE  50 mg Oral Daily  . pravastatin  80 mg Oral q1800  . sodium chloride flush  3 mL Intravenous Q12H  . sodium chloride flush  3 mL Intravenous Q12H  . sodium zirconium cyclosilicate  10 g Oral BID  . zinc sulfate  220 mg Oral Daily   Continuous Infusions: . sodium chloride    . lactated ringers Stopped (06/14/20 0912)  . remdesivir 100 mg in NS 100 mL 100 mg (06/14/20 0919)     LOS: 3 days    Time spent: 30 minutes    Vassie Loll, MD [facility]   To contact the attending provider between 7A-7P or the covering provider during after hours 7P-7A, please log into the web site [email or web address] and access using universal [place] password for that web site. If you do not have the password, please call the hospital operator.  06/14/2020, 3:20 PM

## 2020-06-15 DIAGNOSIS — L89302 Pressure ulcer of unspecified buttock, stage 2: Secondary | ICD-10-CM

## 2020-06-15 DIAGNOSIS — E875 Hyperkalemia: Secondary | ICD-10-CM

## 2020-06-15 DIAGNOSIS — M5441 Lumbago with sciatica, right side: Secondary | ICD-10-CM | POA: Diagnosis not present

## 2020-06-15 DIAGNOSIS — U071 COVID-19: Secondary | ICD-10-CM | POA: Diagnosis not present

## 2020-06-15 DIAGNOSIS — N179 Acute kidney failure, unspecified: Secondary | ICD-10-CM | POA: Diagnosis not present

## 2020-06-15 DIAGNOSIS — E6609 Other obesity due to excess calories: Secondary | ICD-10-CM | POA: Diagnosis not present

## 2020-06-15 LAB — CBC WITH DIFFERENTIAL/PLATELET
Abs Immature Granulocytes: 0.16 10*3/uL — ABNORMAL HIGH (ref 0.00–0.07)
Basophils Absolute: 0 10*3/uL (ref 0.0–0.1)
Basophils Relative: 0 %
Eosinophils Absolute: 0.7 10*3/uL — ABNORMAL HIGH (ref 0.0–0.5)
Eosinophils Relative: 7 %
HCT: 38.6 % — ABNORMAL LOW (ref 39.0–52.0)
Hemoglobin: 11.8 g/dL — ABNORMAL LOW (ref 13.0–17.0)
Immature Granulocytes: 2 %
Lymphocytes Relative: 19 %
Lymphs Abs: 1.7 10*3/uL (ref 0.7–4.0)
MCH: 28.4 pg (ref 26.0–34.0)
MCHC: 30.6 g/dL (ref 30.0–36.0)
MCV: 93 fL (ref 80.0–100.0)
Monocytes Absolute: 1 10*3/uL (ref 0.1–1.0)
Monocytes Relative: 11 %
Neutro Abs: 5.7 10*3/uL (ref 1.7–7.7)
Neutrophils Relative %: 61 %
Platelets: 192 10*3/uL (ref 150–400)
RBC: 4.15 MIL/uL — ABNORMAL LOW (ref 4.22–5.81)
RDW: 18.2 % — ABNORMAL HIGH (ref 11.5–15.5)
WBC: 9.2 10*3/uL (ref 4.0–10.5)
nRBC: 0.3 % — ABNORMAL HIGH (ref 0.0–0.2)

## 2020-06-15 LAB — GLUCOSE, CAPILLARY
Glucose-Capillary: 225 mg/dL — ABNORMAL HIGH (ref 70–99)
Glucose-Capillary: 226 mg/dL — ABNORMAL HIGH (ref 70–99)
Glucose-Capillary: 227 mg/dL — ABNORMAL HIGH (ref 70–99)
Glucose-Capillary: 237 mg/dL — ABNORMAL HIGH (ref 70–99)
Glucose-Capillary: 247 mg/dL — ABNORMAL HIGH (ref 70–99)
Glucose-Capillary: 333 mg/dL — ABNORMAL HIGH (ref 70–99)

## 2020-06-15 LAB — COMPREHENSIVE METABOLIC PANEL
ALT: 106 U/L — ABNORMAL HIGH (ref 0–44)
AST: 146 U/L — ABNORMAL HIGH (ref 15–41)
Albumin: 3.1 g/dL — ABNORMAL LOW (ref 3.5–5.0)
Alkaline Phosphatase: 44 U/L (ref 38–126)
Anion gap: 9 (ref 5–15)
BUN: 37 mg/dL — ABNORMAL HIGH (ref 8–23)
CO2: 24 mmol/L (ref 22–32)
Calcium: 8.6 mg/dL — ABNORMAL LOW (ref 8.9–10.3)
Chloride: 106 mmol/L (ref 98–111)
Creatinine, Ser: 1.01 mg/dL (ref 0.61–1.24)
GFR, Estimated: >60 mL/min (ref >60)
Glucose, Bld: 255 mg/dL — ABNORMAL HIGH (ref 70–99)
Potassium: 4.8 mmol/L (ref 3.5–5.1)
Sodium: 139 mmol/L (ref 135–145)
Total Bilirubin: 0.5 mg/dL (ref 0.3–1.2)
Total Protein: 6.4 g/dL — ABNORMAL LOW (ref 6.5–8.1)

## 2020-06-15 LAB — FERRITIN: Ferritin: 240 ng/mL (ref 24–336)

## 2020-06-15 LAB — MAGNESIUM: Magnesium: 2 mg/dL (ref 1.7–2.4)

## 2020-06-15 LAB — PHOSPHORUS: Phosphorus: 2.7 mg/dL (ref 2.5–4.6)

## 2020-06-15 LAB — C-REACTIVE PROTEIN: CRP: 1 mg/dL — ABNORMAL HIGH (ref <1.0)

## 2020-06-15 LAB — D-DIMER, QUANTITATIVE: D-Dimer, Quant: 0.78 ug/mL-FEU — ABNORMAL HIGH (ref 0.00–0.50)

## 2020-06-15 MED ORDER — ZINC SULFATE 220 (50 ZN) MG PO CAPS: 220.0000 mg | ORAL_CAPSULE | Freq: Every day | ORAL | 0 refills | Status: AC

## 2020-06-15 MED ORDER — PREDNISONE 20 MG PO TABS: ORAL_TABLET | ORAL | 0 refills | Status: AC

## 2020-06-15 MED ORDER — ONDANSETRON HCL 4 MG/2ML IJ SOLN
INTRAMUSCULAR | Status: AC
  Filled 2020-06-15: qty 2

## 2020-06-15 NOTE — Progress Notes (Addendum)
patient taken by EMS to home. Called patients daughter Alto Denver informing her of d/c time. Also informed caregiver that all eveening dose medications were given. All  patients vitals were within normal limits.

## 2020-06-15 NOTE — Discharge Summary (Signed)
Physician Discharge Summary  Francisco Powell [id number] DOB: 08/31/1940 DOA: 06/11/2020  PCP: [name], MD  Admit date: 06/11/2020 Discharge date: 06/15/2020  Time spent: 35 minutes  Recommendations for Outpatient Follow-up:  1. Repeat basic metabolic panel to evaluate lites renal function 2. Reassess patient's CBGs with further adjustment to hypoglycemic regimen as needed.   Discharge Diagnoses:  Principal Problem:   Acute hypoxemic respiratory failure due to COVID-19 [facility]) Active Problems:   Hyperlipidemia   Essential hypertension   Coronary atherosclerosis of native coronary artery   Depression   Generalized weakness   Chronic low back pain   Class 1 obesity due to excess calories with body mass index (BMI) of 32.0 to 32.9 in adult   Uncontrolled type 2 diabetes mellitus with hyperglycemia (HCC)   Acute respiratory failure with hypoxia (HCC)   Pressure injury of skin   AKI (acute kidney injury) ([place])   Hyperkalemia   Discharge Condition: Stable and improved.  Discharged home with instruction to follow-up with PCP in 2 weeks.  CODE STATUS: DNR/DNI  Diet recommendation: Healthy modified carbohydrate.  Filed Weights   06/11/20 1740  Weight: 108 kg    History of present illness:  As per H&P written by Dr. Lorin Mercy on 06/11/2020 Francisco Powell a 80 y.o.malewith medical history significant ofobesity (BMI 32.28); CAD; RCC; HLD; HTN; and DM presenting with worsening COVID symptoms. He started with SOB and cough on 12/26. He was vaccinated but not boosted. Home test was positive and he had COVID exposures with multiple family members. Over the last day, he became increasingly confused with sats in the 80s and so was sent in.   I spoke with his daughter, who is a Marine scientist. He stays alone at night; his children both live on the same road. They put him to bed and get him up. He transfers to wheelchair/recliner but no longer walks. Has Life Alert. He told his  sister last week that he was ready to "go home... to see Jesus." He has ACP documents in the chart including DNR.   ED Course:COVID, sick on 12/26. Vaccinated but not boosted. Confused, SOB, cough, less active. 80s, on 2-3 L [place] O2.  Hospital Course:  1-acute hypoxemic respiratory failure due to COVID-19 [facility]) -Reports feeling well and ready to go home.  -Continue steroids tapering -patient completed tx with remdesivir and barcitinib.  -Continue the use of flutter valve and incentive spirometer as instructed. -no oxygen supplementation required.  -Continue supportive care   2-acute kidney injury -In the setting of prerenal azotemia most likely -back to normal at time of discharge -Continue to maintain adequate hydration  -repeat BMET at follow up visit to reassess stability  -Continue holding lisinopril  -ok to resume spironolactone   3-type 2 diabetes with hyperglycemia -Recent A1c 7.3 -Hyperglycemia appears to be in the setting of steroids usage. -Anticipating a stabilization of patient's CBGs once steroid therapy completed. -resuming Home hypoglycemic regimen.  4-Hyperlipidemia -Continue statin.  5-Essential hypertension/coronary artery disease -No chest pain -Continue the use of Coreg and amlodipine -Will also continue aspirin and Imdur -Continue outpatient follow-up with cardiology service.  6-hyperkalemia -No acute abnormalities appreciated on telemetry or EKG -Potassium WNL at discharge -will continue holding ACE inhibitors at discharge -ok to resume spironolactone. -Will recommend repeat basic metabolic panel at follow up visit to reassess electrolytes stability  7-Depression -Overall stable -Continue the use of Xanax and Cymbalta -No suicidal ideation or hallucination appreciated.  8-class I obesity -Body mass index is  32.28 kg/m. -Low calorie diet and portion control has been discussed with patient.  9-chronic back pain and  debility -Patient with prior history of multiple back surgeries resulting in immobility. -Patient no longer ambulatory at baseline -Continue the use of Neurontin and home analgesics.  10- 1 out of 2 positive blood culture -Demonstrating Staphylococcus hominis, appears to be contaminant -No antibiotics provided.  11-DNR -Patient wishes and decisions respected -Continue to treat what is treatable -Patient's family requested outpatient referral to palliative care.  Procedures: See below for x-ray reports  Consultations:  None  Discharge Exam: Vitals:   06/14/20 2113 06/15/20 0506  BP: (!) 144/69 120/71  Pulse: (!) 56 (!) 54  Resp:  18  Temp: 98.7 F (37.1 C) 97.7 F (36.5 C)  SpO2: 93% 94%    General: In no acute distress; denies chest pain, no nausea, no vomiting.  Significant improvement in his breathing and no longer requiring oxygen supplementation.  Patient feels ready to go home. Cardiovascular: S1 and S2, no rubs, no gallops, no JVD. Respiratory: Improved air movement bilaterally; positive rhonchi, no wheezing, no crackles, no using accessory muscle. Abdomen: Soft, nontender, distended, positive bowel sounds Extremities: No cyanosis or clubbing.  Discharge Instructions   Discharge Instructions    Diet - low sodium heart healthy   Complete by: As directed    Discharge instructions   Complete by: As directed    Take medications as prescribed Maintain adequate hydration  Follow-up with PCP in 2 weeks. Continue to follow daily weights and heart healthy/low sodium diet.   No wound care   Complete by: As directed      Allergies as of 06/15/2020      Reactions   Penicillins Hives, Itching, Swelling   Has patient had a PCN reaction causing immediate rash, facial/tongue/throat swelling, SOB or lightheadedness with hypotension: Unknown Has patient had a PCN reaction causing severe rash involving mucus membranes or skin necrosis: Unknown Has patient had a PCN  reaction that required hospitalization: Unknown Has patient had a PCN reaction occurring within the last 10 years: Unknown If all of the above answers are "NO", then may proceed with Cephalosporin use.   Other    Bee stings   Atorvastatin Other (See Comments)   Muscle pain/cramps   Celexa [citalopram] Diarrhea      Medication List    STOP taking these medications   lisinopril 40 MG tablet Commonly known as: ZESTRIL     TAKE these medications   ALPRAZolam 1 MG tablet Commonly known as: XANAX Take 0.5-1 tablets (0.5-1 mg total) by mouth at bedtime as needed. for sleep   amLODipine 10 MG tablet Commonly known as: NORVASC TAKE TABLET BY MOUTH ONCE  DAILY.   aspirin 81 MG chewable tablet Chew 81 mg by mouth daily.   carvedilol 25 MG tablet Commonly known as: COREG TAKE TABLET BY MOUTH TWICE  DAILY.   colchicine 0.6 MG tablet Take 1 tablet twice daily for gout relief What changed:   how much to take  how to take this  when to take this  reasons to take this  additional instructions   diphenhydramine-acetaminophen 25-500 MG Tabs tablet Commonly known as: TYLENOL PM Take 2 tablets by mouth at bedtime as needed.   DULoxetine 60 MG capsule Commonly known as: CYMBALTA Take 1 capsule (60 mg total) by mouth daily.   gabapentin 300 MG capsule Commonly known as: NEURONTIN Take two capsules po each morning, one capsule mid afternoon and 2 each evening.  CAUTION:DROWSINESS What changed:   how much to take  how to take this  when to take this  additional instructions   GLUCOSAMINE CHOND CMP ADVANCED PO Take by mouth. bid   OSTEO BI-FLEX TRIPLE STRENGTH PO Take 1 tablet by mouth daily.   HumaLOG KwikPen 100 UNIT/ML KwikPen Generic drug: insulin lispro INJECT SUBCUTANEOUSLY 25  UNITS 3 TIMES DAILY What changed: See the new instructions.   insulin detemir 100 UNIT/ML injection Commonly known as: Levemir INJECT SUBCUTANEOUSLY 58  UNITS IN THE MORNING AND 62  UNITS IN THE EVENING   isosorbide mononitrate 30 MG 24 hr tablet Commonly known as: IMDUR Take 30 mg (1 tablet) every evening. What changed:   how much to take  how to take this  when to take this   Melatonin 10 MG Tabs Take 10 mg by mouth at bedtime.   metFORMIN 500 MG tablet Commonly known as: GLUCOPHAGE 1 bid   multivitamin with minerals tablet Take 1 tablet by mouth daily.   nitroGLYCERIN 0.4 MG SL tablet Commonly known as: NITROSTAT Place 1 tablet (0.4 mg total) under the tongue every 5 (five) minutes x 3 doses as needed for chest pain.   Ozempic (1 MG/DOSE) 2 MG/1.5ML Sopn Generic drug: Semaglutide (1 MG/DOSE) Inject one mg once weekly   pravastatin 80 MG tablet Commonly known as: PRAVACHOL 1 each evening   predniSONE 20 MG tablet Commonly known as: DELTASONE Take 2 tablets by mouth daily x2 days; then 1 tablet by mouth daily x3 days; then half tablet by mouth daily x3 days and stop prednisone.   psyllium 0.52 g capsule Commonly known as: REGULOID Take 0.52 g by mouth daily.   spironolactone 25 MG tablet Commonly known as: ALDACTONE TAKE 1 TABLET BY MOUTH  DAILY   STOOL SOFTENER LAXATIVE PO Take 1 tablet by mouth in the morning and at bedtime.   vitamin C 1000 MG tablet Take 1,000 mg by mouth in the morning and at bedtime.   zinc sulfate 220 (50 Zn) MG capsule Take 1 capsule (220 mg total) by mouth daily. Start taking on: June 16, 2020      Allergies  Allergen Reactions  . Penicillins Hives, Itching and Swelling    Has patient had a PCN reaction causing immediate rash, facial/tongue/throat swelling, SOB or lightheadedness with hypotension: Unknown Has patient had a PCN reaction causing severe rash involving mucus membranes or skin necrosis: Unknown Has patient had a PCN reaction that required hospitalization: Unknown Has patient had a PCN reaction occurring within the last 10 years: Unknown If all of the above answers are "NO", then may  proceed with Cephalosporin use.   . Other     Bee stings  . Atorvastatin Other (See Comments)    Muscle pain/cramps   . Celexa [Citalopram] Diarrhea    Follow-up Information    [facility] Follow up.   Why: Will contact patient Contact information: [place]       [name], MD. Schedule an appointment as soon as possible for a visit in 2 week(s).   Specialty: Family Medicine Contact information: [place] [address] [phone]        [name], MD .   Specialty: Cardiology Contact information: [place] [address] [phone]               The results of significant diagnostics from this hospitalization (including imaging, microbiology, ancillary  and laboratory) are listed below for reference.    Significant Diagnostic Studies: DG Chest Port 1 View  Result Date: 06/11/2020 CLINICAL DATA:  Shortness of breath.  COVID positive. EXAM: PORTABLE CHEST 1 VIEW COMPARISON:  08/10/2019 FINDINGS: Cardiomegaly, equivocally increased versus differences in technique. Unchanged mediastinal contours. Vague heterogeneous opacities in the mid lower lung zones. No pneumothorax or large pleural effusion. No acute osseous abnormalities are seen. Degenerative change of both shoulders. IMPRESSION: 1. Vague heterogeneous opacities in the mid lower lung zones, pattern consistent with COVID-19 pneumonia. 2. Cardiomegaly, equivocally increased from prior exam versus differences in technique. Electronically Signed   By: Keith Rake M.D.   On: 06/11/2020 18:22    Microbiology: Recent Results (from the past 240 hour(s))  Blood Culture (routine x 2)     Status: Abnormal   Collection Time: 06/11/20  5:50 PM   Specimen: BLOOD RIGHT HAND  Result Value Ref Range Status   Specimen Description   Final    BLOOD RIGHT HAND Performed at [facility], [address] [place], [place] [address]    Special Requests   Final    BOTTLES DRAWN AEROBIC AND ANAEROBIC Blood Culture adequate volume Performed at [facility], [address], [place], [place] [address]    Culture  Setup Time   Final    GRAM POSITIVE COCCI IN BOTH AEROBIC AND ANAEROBIC BOTTLES Gram Stain Report Called to,Read Back By and Verified With: WHITE,M@1328  by MATTHEWS,B 12.29.21  [facility] Organism ID to follow CRITICAL RESULT CALLED TO, READ BACK BY AND VERIFIED WITH: T EASTER RN 12/29/21147 JDW    Culture (A)  Final    STAPHYLOCOCCUS HOMINIS THE SIGNIFICANCE OF ISOLATING THIS ORGANISM FROM A SINGLE SET OF BLOOD CULTURES WHEN MULTIPLE SETS ARE DRAWN IS UNCERTAIN. PLEASE NOTIFY THE MICROBIOLOGY DEPARTMENT WITHIN ONE WEEK IF SPECIATION AND SENSITIVITIES ARE REQUIRED. Performed at [facility], [place] [address], [place], [place] [address]    Report Status 06/14/2020 FINAL  Final  Blood Culture (routine x 2)     Status: None (Preliminary result)   Collection Time: 06/11/20  5:50 PM   Specimen: BLOOD LEFT HAND  Result Value Ref Range Status   Specimen Description BLOOD LEFT HAND  Final   Special Requests   Final    BOTTLES DRAWN AEROBIC AND ANAEROBIC Blood Culture adequate volume   Culture   Final    NO GROWTH 4 DAYS Performed at [facility], [address], [place], [place] [address]    Report Status PENDING  Incomplete  Blood Culture ID Panel (Reflexed)     Status: Abnormal   Collection Time: 06/11/20  5:50 PM  Result Value Ref Range Status   Enterococcus faecalis NOT DETECTED NOT DETECTED Final   Enterococcus Faecium NOT DETECTED NOT DETECTED Final   Listeria monocytogenes NOT DETECTED NOT DETECTED Final   Staphylococcus species DETECTED (A) NOT DETECTED Final    Comment: CRITICAL RESULT CALLED TO, READ BACK BY AND VERIFIED WITH: T EASTER RN 06/12/20 2147 JDW    Staphylococcus aureus (BCID) NOT DETECTED NOT DETECTED Final   Staphylococcus epidermidis NOT DETECTED NOT DETECTED  Final   Staphylococcus lugdunensis NOT DETECTED NOT DETECTED Final   Streptococcus species NOT DETECTED NOT DETECTED Final   Streptococcus agalactiae NOT DETECTED NOT DETECTED Final   Streptococcus pneumoniae NOT DETECTED NOT DETECTED Final   Streptococcus pyogenes NOT DETECTED NOT DETECTED Final   A.calcoaceticus-baumannii NOT DETECTED NOT DETECTED Final   Bacteroides fragilis NOT DETECTED NOT DETECTED Final  Enterobacterales NOT DETECTED NOT DETECTED Final   Enterobacter cloacae complex NOT DETECTED NOT DETECTED Final   Escherichia coli NOT DETECTED NOT DETECTED Final   Klebsiella aerogenes NOT DETECTED NOT DETECTED Final   Klebsiella oxytoca NOT DETECTED NOT DETECTED Final   Klebsiella pneumoniae NOT DETECTED NOT DETECTED Final   Proteus species NOT DETECTED NOT DETECTED Final   Salmonella species NOT DETECTED NOT DETECTED Final   Serratia marcescens NOT DETECTED NOT DETECTED Final   Haemophilus influenzae NOT DETECTED NOT DETECTED Final   Neisseria meningitidis NOT DETECTED NOT DETECTED Final   Pseudomonas aeruginosa NOT DETECTED NOT DETECTED Final   Stenotrophomonas maltophilia NOT DETECTED NOT DETECTED Final   Candida albicans NOT DETECTED NOT DETECTED Final   Candida auris NOT DETECTED NOT DETECTED Final   Candida glabrata NOT DETECTED NOT DETECTED Final   Candida krusei NOT DETECTED NOT DETECTED Final   Candida parapsilosis NOT DETECTED NOT DETECTED Final   Candida tropicalis NOT DETECTED NOT DETECTED Final   Cryptococcus neoformans/gattii NOT DETECTED NOT DETECTED Final    Comment: Performed at [facility], [place] [address] [place], [place] [address]     Labs: Basic Metabolic Panel: Recent Labs  Lab 06/11/20 1750 06/12/20 0450 06/13/20 0545 06/13/20 1348 06/13/20 2107 06/14/20 0120 06/14/20 0820 06/15/20 0752  NA 140 139 143  --   --   --  139 139  K 5.3* 5.8* 6.5* 5.4* 5.2* 5.0 4.7 4.8  CL 114* 110 109  --   --   --  107 106  CO2 19* 18* 23  --   --   --   24 24  GLUCOSE 81 296* 330*  --   --   --  256* 255*  BUN 32* 33* 37*  --   --   --  36* 37*  CREATININE 1.75* 1.42* 1.14  --   --   --  0.99 1.01  CALCIUM 8.1* 8.6* 9.2  --   --   --  8.6* 8.6*  MG  --  2.0 2.2  --   --   --  2.0 2.0  PHOS  --  4.3 3.5  --   --   --  2.4* 2.7   Liver Function Tests: Recent Labs  Lab 06/11/20 1750 06/12/20 0450 06/13/20 0545 06/14/20 0820 06/15/20 0752  AST 23 24 56* 89* 146*  ALT 16 17 23  46* 106*  ALKPHOS 47 57 49 42 44  BILITOT 0.5 0.5 0.4 0.4 0.5  PROT 6.8 7.3 7.1 6.3* 6.4*  ALBUMIN 3.3* 3.4* 3.2* 2.8* 3.1*   CBC: Recent Labs  Lab 06/11/20 1750 06/12/20 0450 06/13/20 0545 06/14/20 0820 06/15/20 0752  WBC 9.0 9.5 10.5 8.9 9.2  NEUTROABS 5.5 7.8* 7.3 6.7 5.7  HGB 11.5* 12.4* 11.9* 11.7* 11.8*  HCT 39.3 41.5 40.9 37.9* 38.6*  MCV 97.5 96.5 96.9 92.7 93.0  PLT 187 190 216 209 192   CBG: Recent Labs  Lab 06/14/20 2001 06/15/20 0011 06/15/20 0444 06/15/20 0738 06/15/20 1121  GLUCAP 244* 225* 227* 226* 237*    Signed:  Barton Dubois MD.  [facility] 06/15/2020, 1:25 PM

## 2020-06-15 NOTE — TOC Transition Note (Signed)
Transition of Care [facility]) - CM/SW Discharge Note   Patient Details  Name: Francisco Powell MRN: [id number] Date of Birth: 03-22-1941  Transition [facility] CM/SW Contact:  Barry Brunner, LCSW Phone Number: 06/15/2020, 10:46 AM   Clinical Narrative:    CSW notified of families request for OP palliative. CSW placed referral with [facility]. [facility] on call nurse agreeable to follow up with family for referral. CSW completed med necessity form and contacted EMS. TOC signing off.   Final next level of care: Other (comment) (Home with OP palliative) Barriers to Discharge: Barriers Resolved   Patient Goals and CMS Choice Patient states their goals for this hospitalization and ongoing recovery are:: Return Home with OP Palliative CMS Medicare.gov Compare Post Acute Care list provided to:: Patient Choice offered to / list presented to : Patient  Discharge Placement                Patient to be transferred to facility by[facility] Name of family member notified: [place] Patient and family notified of of transfer: 06/15/20  Discharge Plan and Services In-house Referral: NA Discharge Planning Services: NA Post Acute Care Choice: NA          DME Arranged: N/A DME Agency: NA       HH Arranged: NA HH Agency: NA        Social Determinants of Health (SDOH) Interventions     Readmission Risk Interventions Readmission Risk Prevention Plan 06/15/2020 06/13/2020  Transportation Screening Complete Complete  PCP or Specialist Appt within 3-5 Days Not Complete -  Not Complete comments D/Ced on Holiday weekend. PCP not open -  HRI or Home Care Consult Complete Complete  Social Work Consult for Recovery Care Planning/Counseling Complete Complete  Palliative Care Screening Complete Not Applicable  Medication Review Oceanographer) Complete Complete  Some recent data might be hidden

## 2020-06-16 ENCOUNTER — Telehealth: Payer: Self-pay | Admitting: Family Medicine

## 2020-06-16 LAB — CULTURE, BLOOD (ROUTINE X 2)
Culture: NO GROWTH
Special Requests: ADEQUATE

## 2020-06-16 NOTE — Telephone Encounter (Signed)
Nurses Patient recently in the hospital with Covid infection Will need to do a follow-up in a couple weeks Patient has severe weakness and mobility issues Will not be able to come to the office more than likely  Please touch base with his daughter Elita Quick Let her know that we are aware that he was discharged from the hospital We could do a follow-up visit in 2 weeks to 3 weeks If he is able to come up in the car I could do a visit at his car to avoid the physical stress of him having to come into the office  If they do not feel he can come to the office I can work with Elita Quick to try to arrange a home visit somewhere in the next 2 to 3 weeks  Should they need anything sooner please let me know  Also let me know what Pam has to say regarding follow-up visit thank you

## 2020-06-17 NOTE — Telephone Encounter (Signed)
Discussed with pt's daughter Elita Quick and she states she tested positive for covid last night. States Jereld is doing well O2 this morning 94, moving better and eating and drinking well. States she has not put him in a car in 2 months and ems had to bring him home from hospital. She would like to set up a home visit and she is off work on Thursdays and fridays.

## 2020-06-19 ENCOUNTER — Encounter: Payer: Self-pay | Admitting: Family Medicine

## 2020-06-19 ENCOUNTER — Other Ambulatory Visit: Payer: Self-pay | Admitting: Family Medicine

## 2020-06-20 ENCOUNTER — Telehealth: Payer: Self-pay | Admitting: *Deleted

## 2020-06-20 NOTE — Telephone Encounter (Signed)
Discussed with pt's daughter Elita Quick and she wants to know if dr Lorin Picket thinks pallative care or hospice would be best for him.

## 2020-06-20 NOTE — Telephone Encounter (Signed)
Angelique Blonder -  [facility] palliative care is calling stating patient recently discharged from hospital and palliative care was recommended. Wanting verbal order form Dr. Lorin Picket to proceed with this.  [phone] Option 2

## 2020-06-20 NOTE — Telephone Encounter (Signed)
I would recommend palliative care first When I do a home visit then we can have further discussion and if based upon how Francisco Powell is doing may or may not have to move toward hospice  Please set up home visit for January 21 it would be near lunchtime, 1230-ish Please put onto my schedule thank you

## 2020-06-20 NOTE — Telephone Encounter (Signed)
Discussed with pt's daughter pam and she verbalized understanding. Verbal order given for pallative care and pt added to schedule for home visit and daughter states that will be a good day and time for them.

## 2020-06-20 NOTE — Telephone Encounter (Signed)
Nurses Please talk with his daughter Elita Quick let them know that palliative care has reached out to [place] and is interested in doing a consult if the family desires this

## 2020-06-21 ENCOUNTER — Other Ambulatory Visit: Payer: Self-pay | Admitting: Family Medicine

## 2020-06-23 ENCOUNTER — Telehealth: Payer: Self-pay | Admitting: Family Medicine

## 2020-06-23 NOTE — Telephone Encounter (Signed)
Front January 21 please add to my schedule as a home visit at approximately 1 PM Please notify Pam (That particular day more than likely I will be also coming to the office late that afternoon to cover urgent care exact details yet to come)

## 2020-06-24 NOTE — Telephone Encounter (Signed)
Please advise. Thank you

## 2020-06-25 ENCOUNTER — Telehealth: Payer: Self-pay | Admitting: Nurse Practitioner

## 2020-06-25 NOTE — Telephone Encounter (Signed)
Spoke with daughter, [name] regarding the Palliative referral/services and all questions were answered and she was in agreement with scheduling a visit.  I have scheduled a Telephone Consult for 06/27/20 @ 8:30 AM.

## 2020-06-27 ENCOUNTER — Other Ambulatory Visit: Payer: Self-pay

## 2020-06-27 ENCOUNTER — Encounter: Payer: Self-pay | Admitting: Nurse Practitioner

## 2020-06-27 ENCOUNTER — Other Ambulatory Visit: Payer: Medicare Other | Admitting: Nurse Practitioner

## 2020-06-27 DIAGNOSIS — Z515 Encounter for palliative care: Secondary | ICD-10-CM

## 2020-06-27 DIAGNOSIS — I25118 Atherosclerotic heart disease of native coronary artery with other forms of angina pectoris: Secondary | ICD-10-CM | POA: Diagnosis not present

## 2020-06-27 NOTE — Progress Notes (Signed)
Therapist, nutritional Palliative Care Consult Note Telephone: [phone]  Fax: [phone]  PATIENT NAME: Francisco Powell DOB: 07-25-1940 MRN: [id number]  PRIMARY CARE PROVIDER:   Babs Sciara, MD  REFERRING PROVIDER:  Babs Sciara, MD [address] [place],  [place] [address]  RESPONSIBLE PARTY:   Daughter [place] [phone]; [phone]  Due to the COVID-19 crisis, this visit was done via telemedicine from my office and it was initiated and consent by this patient and or family  I was asked by Dr Gerda Diss to see Francisco Powell complex medical decision making for Palliative care consult  1. Advance Care Planning; DNR; discussed Hospice    2. Goals of Care: Goals include to maximize quality of life and symptom management. Our advance care planning conversation included a discussion about:     The value and importance of advance care planning   Exploration of personal, cultural or spiritual beliefs that might influence medical decisions   Exploration of goals of care in the event of a sudden injury or illness   Identification and preparation of a healthcare agent   Review and updating or creation of an  advance directive document.   3. Palliative care encounter; Palliative care encounter; Palliative medicine team will continue to support patient, patient's family, and medical team. Visit consisted of counseling and education dealing with the complex and emotionally intense issues of symptom management and palliative care in the setting of serious and potentially life-threatening illness   4. f/u 1 month for ongoing monitoring chronic disease progression, ongoing discussions complex medical decision making  I spent  60 minutes providing this consultation,  from 8:30am to 9:30am. More than 50% of the time in this consultation was spent coordinating communication.   HISTORY OF PRESENT ILLNESS:  Francisco Powell is a 80 y.o. year old male with  multiple medical problems including Obesity, non-stemi st-elevation myocardial infarction, Kidney carcinoma 1998, coronary artery disease, ground deficiency anemia, hypertension, diabetes, gout, hyperlipidemia, history of sciatica, history of covid infection, history of h-pylori infection,  depression , cholecystectomy, back surgery, appendectomy, eye surgery, tonsillectomy, retinal detachment surgery, posterior lumbar fusion with multiple back surgeries. Hospitalized 12/28 / 2021 to 1 / 1 / 2022 for acute hypoxic respiratory failure secondary to covid infection. Acute kidney injury in the setting of prerenal azotemia back to normal pain discharge. Recent hemoglobin A1c 7.3 for diabetes. Depression stable overall use of Cymbalta and Xanax. Widowed, son and daughter. Daughter is a Engineer, civil (consulting). He does stay at [facility] at night but children live on the same road and are frequently with Francisco Powell. He does have in-home caregivers. I called Francisco Powell daughter Francisco Powell for telemedicine telephonic as video not available initial Palliative care visit. We talked about purpose of Palliative care visit. Francisco Powell in agreement. We talked about past medical history the last time Francisco Powell was [place] at [place]. 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 We talked about chronic disease and progression. We talked about recent hospitalization. We talked about Francisco Powell as she works as a Designer, jewellery and cares for Francisco Powell at home. Francisco Powell endorses they have caregivers that do come in during the day which helps. Francisco Powell and I talked about Francisco Powell functional level as he now does require maximum assistance. Francisco Powell talked about Francisco Powell able to with assistance stand and pivot to the wheelchair though it is with great difficulty. Francisco Powell does require to be bathed, dressed. Francisco  Powell does use a urinal but has episodes of incontinence. Francisco Powell does feed himself an appetite has been good per Francisco Powell. No noted weight loss.  Francisco Powell does have a red and area on his buttocks for which Francisco Powell is providing wound care to. We talked about medical goals of care is he is a DNR. We talked about overall decline in progression especially over the last 3 months. We talked about Hospice benefit under Medicare program. Francisco Powell endorses she previously worked as a Merchandiser, retail for [employer]. Francisco Powell is aware of the service has been hoping that the Hospice Physician can review case for eligibility. Discuss with Francisco Powell will review and update once determined eligibility. If determined determined not eligible at this time will continue to follow under Palliative care, schedule follow-up appointment. We talked about role of Palliative care and plan of care. Therapeutic listening, emotional support provided. Contact information. Questions answered to satisfaction.  Palliative Care was asked to help to continue to address goals of care.   CODE STATUS: DNR  PPS: 40% HOSPICE ELIGIBILITY/DIAGNOSIS: TBD  PAST MEDICAL HISTORY:  Past Medical History:  Diagnosis Date  . Anemia, iron deficiency   . Anxiety   . Asbestosis(501)   . Coronary atherosclerosis of native coronary artery    DES RCA and staged DES LAD May 2015, LVEF 55%  . Diabetic peripheral neuropathy ([place]) 08/22/2019  . Essential hypertension   . Gout   . Hx of adenomatous colonic polyps   . Hyperlipidemia   . Kidney carcinoma ([place]) 1998  . Lumbar back pain    Spondylosis and spondylolisthesis   . Myocardial infarction ([place]) 2004  . Nephrolithiasis   . NSTEMI (non-ST elevated myocardial infarction) [facility])    May 2015  . Obesity   . Sciatica of right side   . Type II diabetes mellitus ([place])     SOCIAL HX:  Social History   Tobacco Use  . Smoking status: Never Smoker  . Smokeless tobacco: Current User    Types: Chew  Substance Use Topics  . Alcohol use: No    Alcohol/week: 0.0 standard drinks    ALLERGIES:  Allergies  Allergen Reactions  . Penicillins Hives,  Itching and Swelling    Has patient had a PCN reaction causing immediate rash, facial/tongue/throat swelling, SOB or lightheadedness with hypotension: Unknown Has patient had a PCN reaction causing severe rash involving mucus membranes or skin necrosis: Unknown Has patient had a PCN reaction that required hospitalization: Unknown Has patient had a PCN reaction occurring within the last 10 years: Unknown If all of the above answers are "NO", then may proceed with Cephalosporin use.   . Other     Bee stings  . Atorvastatin Other (See Comments)    Muscle pain/cramps   . Celexa [Citalopram] Diarrhea     PERTINENT MEDICATIONS:  Outpatient Encounter Medications as of 06/27/2020  Medication Sig  . ALPRAZolam (XANAX) 1 MG tablet TAKE 1/2 TO 1 TABLET BY  MOUTH AT BEDTIME AS NEEDED  FOR SLEEP  . amLODipine (NORVASC) 10 MG tablet TAKE TABLET BY MOUTH ONCE  DAILY  . Ascorbic Acid (VITAMIN C) 1000 MG tablet Take 1,000 mg by mouth in the morning and at bedtime.  Marland Kitchen aspirin 81 MG chewable tablet Chew 81 mg by mouth daily.  . carvedilol (COREG) 25 MG tablet TAKE 1 TABLET BY MOUTH  TWICE DAILY  . colchicine 0.6 MG tablet Take 1 tablet twice daily for gout relief (Patient taking differently: Take 0.6 mg  by mouth daily as needed. As needed for gout)  . diphenhydramine-acetaminophen (TYLENOL PM) 25-500 MG TABS tablet Take 2 tablets by mouth at bedtime as needed.  Mariane Baumgarten Sodium (STOOL SOFTENER LAXATIVE PO) Take 1 tablet by mouth in the morning and at bedtime.  . DULoxetine (CYMBALTA) 60 MG capsule TAKE 1 CAPSULE BY MOUTH  DAILY  . gabapentin (NEURONTIN) 300 MG capsule Take two capsules po each morning, one capsule mid afternoon and 2 each evening.  CAUTION:DROWSINESS (Patient taking differently: Take 300 mg by mouth See admin instructions. Take 600 mg in the morning, 600 in the afternoon and 300 at bedtime)  . HUMALOG KWIKPEN 100 UNIT/ML KwikPen INJECT SUBCUTANEOUSLY 25  UNITS 3 TIMES DAILY (Patient taking  differently: Inject 25 Units into the skin 3 (three) times daily.)  . insulin detemir (LEVEMIR) 100 UNIT/ML injection INJECT SUBCUTANEOUSLY 58  UNITS IN THE MORNING AND 62 UNITS IN THE EVENING  . isosorbide mononitrate (IMDUR) 30 MG 24 hr tablet Take 30 mg (1 tablet) every evening. (Patient taking differently: Take 30 mg by mouth daily. Take 30 mg (1 tablet) every evening.)  . Melatonin 10 MG TABS Take 10 mg by mouth at bedtime.  . metFORMIN (GLUCOPHAGE) 500 MG tablet TAKE 1 TABLET BY MOUTH  TWICE DAILY  . Misc Natural Products (GLUCOSAMINE CHOND CMP ADVANCED PO) Take by mouth. bid  . Misc Natural Products (OSTEO BI-FLEX TRIPLE STRENGTH PO) Take 1 tablet by mouth daily.  . Multiple Vitamins-Minerals (MULTIVITAMIN WITH MINERALS) tablet Take 1 tablet by mouth daily.  . nitroGLYCERIN (NITROSTAT) 0.4 MG SL tablet Place 1 tablet (0.4 mg total) under the tongue every 5 (five) minutes x 3 doses as needed for chest pain.  Marland Kitchen OZEMPIC, 1 MG/DOSE, 4 MG/3ML SOPN INJECT SUBCUTANEOUSLY 1 MG  ONCE WEEKLY  . pravastatin (PRAVACHOL) 80 MG tablet TAKE 1 TABLET BY MOUTH IN  THE EVENING  . predniSONE (DELTASONE) 20 MG tablet Take 2 tablets by mouth daily x2 days; then 1 tablet by mouth daily x3 days; then half tablet by mouth daily x3 days and stop prednisone.  . psyllium (REGULOID) 0.52 g capsule Take 0.52 g by mouth daily.  Marland Kitchen spironolactone (ALDACTONE) 25 MG tablet TAKE 1 TABLET BY MOUTH  DAILY  . zinc sulfate 220 (50 Zn) MG capsule Take 1 capsule (220 mg total) by mouth daily.   No facility-administered encounter medications on file as of 06/27/2020.    PHYSICAL EXAM:  Deferred   Ashmi Blas Z Abbeygail Igoe, NP

## 2020-07-02 ENCOUNTER — Telehealth: Payer: Self-pay | Admitting: Nurse Practitioner

## 2020-07-02 NOTE — Telephone Encounter (Signed)
I called Pam with update from Hospice Physician case review. We talked about Hospice Physicians agree to meeting eligibility for Hospice services. Pam endorses she wants to further discuss with Dr Wolfgang Phoenix and Mr. Regas for ongoing discussion. Pan endorses Mr. Magowan has an upcoming appointment with Mr. Maberry 07/05/2020 and will further discuss at that time. Will call with update to either proceed with Hospice services or schedule f/u PC visit. Pam I agreement. Questions answered to satisfaction

## 2020-07-03 DIAGNOSIS — Z7409 Other reduced mobility: Secondary | ICD-10-CM | POA: Diagnosis not present

## 2020-07-03 DIAGNOSIS — R531 Weakness: Secondary | ICD-10-CM | POA: Diagnosis not present

## 2020-07-05 ENCOUNTER — Ambulatory Visit: Payer: Medicare Other | Admitting: Family Medicine

## 2020-07-07 DIAGNOSIS — E1165 Type 2 diabetes mellitus with hyperglycemia: Secondary | ICD-10-CM | POA: Diagnosis not present

## 2020-07-18 ENCOUNTER — Other Ambulatory Visit: Payer: Self-pay

## 2020-07-18 ENCOUNTER — Telehealth: Payer: Self-pay | Admitting: Family Medicine

## 2020-07-18 ENCOUNTER — Ambulatory Visit: Payer: Medicare Other | Admitting: Family Medicine

## 2020-07-18 DIAGNOSIS — M5441 Lumbago with sciatica, right side: Secondary | ICD-10-CM | POA: Diagnosis not present

## 2020-07-18 DIAGNOSIS — Z66 Do not resuscitate: Secondary | ICD-10-CM

## 2020-07-18 DIAGNOSIS — R4189 Other symptoms and signs involving cognitive functions and awareness: Secondary | ICD-10-CM

## 2020-07-18 DIAGNOSIS — E119 Type 2 diabetes mellitus without complications: Secondary | ICD-10-CM

## 2020-07-18 DIAGNOSIS — R531 Weakness: Secondary | ICD-10-CM | POA: Diagnosis not present

## 2020-07-18 DIAGNOSIS — E1165 Type 2 diabetes mellitus with hyperglycemia: Secondary | ICD-10-CM

## 2020-07-18 DIAGNOSIS — U099 Post covid-19 condition, unspecified: Secondary | ICD-10-CM

## 2020-07-18 DIAGNOSIS — E1142 Type 2 diabetes mellitus with diabetic polyneuropathy: Secondary | ICD-10-CM | POA: Diagnosis not present

## 2020-07-18 DIAGNOSIS — G8929 Other chronic pain: Secondary | ICD-10-CM

## 2020-07-18 DIAGNOSIS — F324 Major depressive disorder, single episode, in partial remission: Secondary | ICD-10-CM | POA: Diagnosis not present

## 2020-07-18 DIAGNOSIS — I2510 Atherosclerotic heart disease of native coronary artery without angina pectoris: Secondary | ICD-10-CM

## 2020-07-18 NOTE — Progress Notes (Addendum)
   Subjective:    Patient ID: Francisco Powell, male    DOB: December 30, 1940, 80 y.o.   MRN: [id number]  HPI Home visit Patient with severe diabetes, hypertension, mild cognitive impairment, short-term memory loss, bedridden, history of coronary artery disease as well as bundle branch block, recent COVID stay with significant hypoxia, Patient stays at home His daughter and his son helps him Long-term prognosis not good  30 minutes spent with family 24 minutes spent in total between chart prep and documentation and time spent with family   Review of Systems Fatigue tiredness difficult time thinking things through other times more sharp some short-term memory issues unable to get out of bed unable to attend to himself    Objective:   Physical Exam Lungs clear respiratory rate normal heart regular no murmurs extremities some edema in the lower legs neuropathy noted in both feet significant global weakness       Assessment & Plan:  1. Diabetic peripheral neuropathy (HCC) On the gabapentin.  Tylenol as needed.  Trying to avoid narcotics.  2. Uncontrolled type 2 diabetes mellitus with hyperglycemia ([place]) Using Ozempic as well as the insulin and Metformin try to keep things under his best control as possible  Patient does have frequent hypoglycemia.  Patient needs continuous monitoring in order to keep him safe.  Dexcom 6 recommended.  3. Chronic midline low back pain with right-sided sciatica Has to position himself in bed to try to help with this  4. Depression, major, single episode, in partial remission ([place]) On Cymbalta family does best okay with him  5. Generalized weakness Global weakness related to a myriad of health issues that is causing him to be bedridden.  Long-term prognosis very poor patient is at peace with this does not want any aggressive measures.  Would prefer to stay at home does not want to be in the hospital patient agrees to palliative care and agrees to hospice  care  Very difficult to predict how long he will live but the significant decline he has had over the past few months indicates more than likely less than 6 months but this is once again impossible to predict  After discussion with family we will go ahead and consult hospice to see if he will be accepted to their program and at the very least excepted for palliative care  Family is in need of help in his care at home  6. Cognitive impairment Patient is able to make decisions for himself but at times has a hard time thinking things through to the fullest degree We will do follow-up again in 6 to 8 weeks

## 2020-07-18 NOTE — Addendum Note (Signed)
Addended by: Dairl Ponder on: 07/18/2020 01:45 PM   Modules accepted: Orders

## 2020-07-18 NOTE — Telephone Encounter (Signed)
Referral to [facility] hospice  Patient at end-stage of life more than likely less than 6 months to live Patient with underlying coronary artery disease recent COVID bed ridden also diabetic depression and mild cognitive impairment  We are requesting hospice consult and consultation Hopefully patient qualifies as hospice but if not as palliative care  Would benefit from assistance Also will need some home health equipment including bariatric bedside commode  Family desires to keep him at home  Contact person for family-Pam's daughter

## 2020-07-18 NOTE — Telephone Encounter (Signed)
Referral ordered in EPIC. 

## 2020-07-21 ENCOUNTER — Emergency Department (HOSPITAL_COMMUNITY): Payer: Medicare Other

## 2020-07-21 ENCOUNTER — Encounter (HOSPITAL_COMMUNITY): Payer: Self-pay

## 2020-07-21 ENCOUNTER — Emergency Department (HOSPITAL_COMMUNITY)
Admission: EM | Admit: 2020-07-21 | Discharge: 2020-07-22 | Disposition: A | Discharge status: Home / Self Care | Payer: Medicare Other | Attending: Emergency Medicine | Admitting: Emergency Medicine

## 2020-07-21 ENCOUNTER — Other Ambulatory Visit: Payer: Self-pay

## 2020-07-21 ENCOUNTER — Encounter: Payer: Self-pay | Admitting: Family Medicine

## 2020-07-21 DIAGNOSIS — Z8616 Personal history of COVID-19: Secondary | ICD-10-CM | POA: Diagnosis not present

## 2020-07-21 DIAGNOSIS — F17220 Nicotine dependence, chewing tobacco, uncomplicated: Secondary | ICD-10-CM | POA: Insufficient documentation

## 2020-07-21 DIAGNOSIS — I499 Cardiac arrhythmia, unspecified: Secondary | ICD-10-CM | POA: Diagnosis not present

## 2020-07-21 DIAGNOSIS — Z743 Need for continuous supervision: Secondary | ICD-10-CM | POA: Diagnosis not present

## 2020-07-21 DIAGNOSIS — I10 Essential (primary) hypertension: Secondary | ICD-10-CM | POA: Diagnosis not present

## 2020-07-21 DIAGNOSIS — Z85528 Personal history of other malignant neoplasm of kidney: Secondary | ICD-10-CM | POA: Insufficient documentation

## 2020-07-21 DIAGNOSIS — E119 Type 2 diabetes mellitus without complications: Secondary | ICD-10-CM | POA: Insufficient documentation

## 2020-07-21 DIAGNOSIS — R079 Chest pain, unspecified: Secondary | ICD-10-CM | POA: Diagnosis not present

## 2020-07-21 DIAGNOSIS — I2510 Atherosclerotic heart disease of native coronary artery without angina pectoris: Secondary | ICD-10-CM | POA: Insufficient documentation

## 2020-07-21 DIAGNOSIS — I4510 Unspecified right bundle-branch block: Secondary | ICD-10-CM | POA: Diagnosis not present

## 2020-07-21 DIAGNOSIS — R6889 Other general symptoms and signs: Secondary | ICD-10-CM | POA: Diagnosis not present

## 2020-07-21 HISTORY — DX: Nonspecific intraventricular block: I45.4

## 2020-07-21 LAB — BASIC METABOLIC PANEL
Anion gap: 9 (ref 5–15)
BUN: 22 mg/dL (ref 8–23)
CO2: 26 mmol/L (ref 22–32)
Calcium: 9 mg/dL (ref 8.9–10.3)
Chloride: 103 mmol/L (ref 98–111)
Creatinine, Ser: 1.06 mg/dL (ref 0.61–1.24)
GFR, Estimated: >60 mL/min (ref >60)
Glucose, Bld: 111 mg/dL — ABNORMAL HIGH (ref 70–99)
Potassium: 3.6 mmol/L (ref 3.5–5.1)
Sodium: 138 mmol/L (ref 135–145)

## 2020-07-21 LAB — TROPONIN I (HIGH SENSITIVITY): Troponin I (High Sensitivity): 13 ng/L (ref <18)

## 2020-07-21 LAB — CBC
HCT: 43.1 % (ref 39.0–52.0)
Hemoglobin: 12.9 g/dL — ABNORMAL LOW (ref 13.0–17.0)
MCH: 28.2 pg (ref 26.0–34.0)
MCHC: 29.9 g/dL — ABNORMAL LOW (ref 30.0–36.0)
MCV: 94.1 fL (ref 80.0–100.0)
Platelets: 327 10*3/uL (ref 150–400)
RBC: 4.58 MIL/uL (ref 4.22–5.81)
RDW: 18 % — ABNORMAL HIGH (ref 11.5–15.5)
WBC: 11.4 10*3/uL — ABNORMAL HIGH (ref 4.0–10.5)
nRBC: 0 % (ref 0.0–0.2)

## 2020-07-21 NOTE — ED Triage Notes (Signed)
Pt brought in by EMS for c/o chest pain that started @ 8:30. Pt took 3 SL Nitro prior to EMS arrival. EMS gave another [employer] as well as 324 ASA.

## 2020-07-22 ENCOUNTER — Encounter: Payer: Self-pay | Admitting: Family Medicine

## 2020-07-22 DIAGNOSIS — R0789 Other chest pain: Secondary | ICD-10-CM | POA: Diagnosis not present

## 2020-07-22 DIAGNOSIS — Z7401 Bed confinement status: Secondary | ICD-10-CM | POA: Diagnosis not present

## 2020-07-22 DIAGNOSIS — R079 Chest pain, unspecified: Secondary | ICD-10-CM | POA: Diagnosis not present

## 2020-07-22 DIAGNOSIS — R279 Unspecified lack of coordination: Secondary | ICD-10-CM | POA: Diagnosis not present

## 2020-07-22 LAB — D-DIMER, QUANTITATIVE: D-Dimer, Quant: 0.41 ug/mL-FEU (ref 0.00–0.50)

## 2020-07-22 LAB — TROPONIN I (HIGH SENSITIVITY): Troponin I (High Sensitivity): 13 ng/L (ref <18)

## 2020-07-22 MED ORDER — NITROGLYCERIN 0.4 MG SL SUBL
0.4000 mg | SUBLINGUAL_TABLET | SUBLINGUAL | 2 refills | Status: AC | PRN
Start: 2020-07-22 — End: ?

## 2020-07-22 MED ORDER — ISOSORBIDE MONONITRATE ER 30 MG PO TB24: ORAL_TABLET | ORAL | 5 refills | Status: AC

## 2020-07-22 NOTE — Addendum Note (Signed)
Addended by: Vicente Males on: 07/22/2020 04:32 PM   Modules accepted: Orders

## 2020-07-22 NOTE — ED Provider Notes (Signed)
Emergency Department [place] Note  I have reviewed the triage vital signs and the nursing notes.  HISTORY  Chief Complaint Chest Pain   HPI Francisco Powell is a 80 y.o. male with multiple medical problems as documented below to include coronary artery disease (status post DES to the RCA and DES to the LAD in 2015), anemia, diabetes, hypertension, gout, kidney cancer and apparently recently had COVID but is not on any baseline oxygen.  He presents to the emergency department today with chest pain.  Started around 2030 today.  Took some nitroglycerin before EMS arrival.  On EMS arrival they gave another dose of nitroglycerin and aspirin.  Patient is chest pain-free at this time.  Denies any fever or cough.  Patient is on oxygen for unclear reasons as he does not wear oxygen at home.  No lower extremity swelling.  Pain is not worse with movement.  Not associated nausea, vomiting, diaphoresis or syncope.  Patient states he feels similar to one of his heart attacks and is sharp stabbing pain in his left chest that goes up to his left shoulder. Not similar to others.   Review of records shows that patient has what is thought to be end stage CAD and is in process of hospice/palliative talks.    Last echo 08/10/19 - grade I diastolic  Last cardiology visit was in September 2020 - noted last myoview was 2013 showing:  IMPRESSION: Abnormal Lexiscan Myoview as outlined in patient with reported history of CAD. There were no clearly diagnostic ST-segment abnormalities with right bundle branch block and left anterior fascicular block noted at baseline. Perfusion imaging shows evidence of soft tissue attenuation versus scar in the inferolateral wall, no definite ischemia however. LVEF is calculated at 42% with relatively diffuse hypokinesis.   No other associated or modifying symptoms.    Past Medical History:  Diagnosis Date  . Anemia, iron deficiency   . Anxiety   . Asbestosis(501)    . BBB (bundle branch block)   . Coronary atherosclerosis of native coronary artery    DES RCA and staged DES LAD May 2015, LVEF 55%  . Diabetic peripheral neuropathy ([place]) 08/22/2019  . Essential hypertension   . Gout   . Hx of adenomatous colonic polyps   . Hyperlipidemia   . Kidney carcinoma ([place]) 1998  . Lumbar back pain    Spondylosis and spondylolisthesis   . Myocardial infarction ([place]) 2004  . Nephrolithiasis   . NSTEMI (non-ST elevated myocardial infarction) [facility])    May 2015  . Obesity   . Sciatica of right side   . Type II diabetes mellitus [facility])     Patient Active Problem List   Diagnosis Date Noted  . AKI (acute kidney injury) ([place])   . Hyperkalemia   . Pressure injury of skin 06/14/2020  . Acute respiratory failure with hypoxia ([place]) 06/13/2020  . Acute hypoxemic respiratory failure due to COVID-19 ([place]) 06/11/2020  . Diabetic peripheral neuropathy ([place]) 08/22/2019  . Accelerated hypertension   . Coronary artery disease   . Cardiac arrhythmia   . Uncontrolled type 2 diabetes mellitus with hyperglycemia ([place]) 08/10/2019  . Major depression in remission ([place]) 10/07/2018  . Class 1 obesity due to excess calories with body mass index (BMI) of 32.0 to 32.9 in adult 10/07/2018  . Chest pain 07/24/2017  . Depression, major, single episode, in partial remission ([place]) 10/01/2016  . Diarrhea 04/20/2016  . Fall at home 03/30/2016  . Generalized weakness 03/30/2016  .  Hypokalemia 03/30/2016  . Chronic low back pain 03/30/2016  . Right leg weakness 03/18/2016  . Insulin dependent diabetes mellitus 08/15/2015  . History of colonic polyps   . Hx of adenomatous colonic polyps 04/04/2015  . Elevated PSA 09/04/2014  . Adenomatous polyps 03/06/2014  . FH: colon cancer 03/06/2014  . Helicobacter pylori (H. pylori) infection 03/06/2014  . Depression 01/14/2014  . Coronary atherosclerosis of native coronary artery 11/11/2013  . Bifascicular block - RBBB, LAFB 11/10/2013  .  Essential hypertension 11/10/2013  . Hyperlipidemia 10/31/2008    Past Surgical History:  Procedure Laterality Date  . APPENDECTOMY    . BACK SURGERY  X 4  . CHOLECYSTECTOMY     Biliary dyskinesia  . COLONOSCOPY  2009   Dr. Arnoldo Morale: normal  . COLONOSCOPY  June 2011   Dr. Gala Romney: normal rectum, left-sided diverticula  . COLONOSCOPY N/A 04/12/2015   Procedure: COLONOSCOPY;  Surgeon: [name], MD;  [facility];  Service: Endoscopy;  Laterality: N/A;  1115   . CYSTOSCOPY W/ LITHOLAPAXY / EHL  2009  . ESOPHAGOGASTRODUODENOSCOPY  June 2011   Dr. Gala Romney: erosive reflux esophagitis, small hiatal hernia, +H.pylori gastritis  . EYE SURGERY    . HOLMIUM LASER APPLICATION Right 4098  . LEFT HEART CATHETERIZATION WITH CORONARY ANGIOGRAM N/A 11/09/2013   Procedure: LEFT HEART CATHETERIZATION WITH CORONARY ANGIOGRAM;  Surgeon: [name], MD;  Location: [facility] CATH LAB;  Service: Cardiovascular;  Laterality: N/A;  . LUMBAR DISC SURGERY  X 3  . PERCUTANEOUS CORONARY STENT INTERVENTION (PCI-S) N/A 11/10/2013   Procedure: PERCUTANEOUS CORONARY STENT INTERVENTION (PCI-S);  Surgeon: [name], MD;  Location: [facility] CATH LAB;  Service: Cardiovascular;  Laterality: N/A;  . POSTERIOR LUMBAR FUSION  2014  . RETINAL DETACHMENT SURGERY Right 1998   Secondary to injury detached retina/cataracts  . TONSILLECTOMY    . TUMOR EXCISION  1995    Current Outpatient Rx  . Order #: [id number] Class: Normal  . Order #: [id number] Class: Normal  . Order #: [id number] Class: Historical Med  . Order #: [id number] Class: Historical Med  . Order #: [id number] Class: Normal  . Order #: [id number] Class: Normal  . Order #: [id number] Class: Historical Med  . Order #: [id number] Class: Historical Med  . Order #: [id number] Class: Normal  . Order #: [id number] Class: Normal  . Order #: [id number] Class: Normal  . Order #: [id number] Class: Normal  . Order #: [id number] Class: Normal  . Order #: [id number] Class:  Historical Med  . Order #: [id number] Class: Normal  . Order #: [id number] Class: Historical Med  . Order #: [id number] Class: Historical Med  . Order #: [id number] Class: Historical Med  . Order #: [id number] Class: Normal  . Order #: [id number] Class: Normal  . Order #: [id number] Class: Normal  . Order #: [id number] Class: Print  . Order #: [id number] Class: Historical Med  . Order #: [id number] Class: Normal  . Order #: [id number] Class: Print    Allergies Penicillins, Other, Atorvastatin, and Celexa [citalopram]  Family History  Problem Relation Age of Onset  . Colon cancer Mother        deceased   . Cancer Maternal Grandmother        Gastric cancer  . Breast cancer Sister   . Heart attack Father   . Colon cancer Maternal Uncle   . Colon cancer Maternal Uncle   . Colon cancer Other        maternal great uncle  . Colon polyps Sister     Social History Social History   Tobacco Use  .  Smoking status: Never Smoker  . Smokeless tobacco: Current User    Types: Chew  Vaping Use  . Vaping Use: Never used  Substance Use Topics  . Alcohol use: No    Alcohol/week: 0.0 standard drinks  . Drug use: No    Review of Systems  All other systems negative except as documented in the HPI. All pertinent positives and negatives as reviewed in the HPI. ____________________________________________  PHYSICAL EXAM:  VITAL SIGNS: ED Triage Vitals  Enc Vitals Group     BP 07/21/20 2245 123/82     Pulse Rate 07/21/20 2240 74     Resp 07/21/20 2240 (!) 27     Temp 07/21/20 2240 98.3 F (36.8 C)     Temp Source 07/21/20 2240 Oral     SpO2 07/21/20 2240 94 %     Weight 07/21/20 2242 238 lb 1.6 oz (108 kg)     Height 07/21/20 2242 6' (1.829 m)    Constitutional: Alert and oriented. Well appearing and in no acute distress. Eyes: Conjunctivae are normal. PERRL. EOMI. Head: Atraumatic. Nose: No congestion/rhinnorhea. Mouth/Throat: Mucous membranes are moist.  Oropharynx non-erythematous. Neck: No  stridor.  No meningeal signs.   Cardiovascular: Normal rate, regular rhythm. Good peripheral circulation. Grossly normal heart sounds.   Respiratory: Normal respiratory effort.  No retractions. Lungs CTAB. Gastrointestinal: Soft and nontender. No distention.  Musculoskeletal: No lower extremity tenderness nor edema. No gross deformities of extremities. Neurologic:  Normal speech and language. No gross focal neurologic deficits are appreciated.  Skin:  Skin is warm, dry and intact. No rash noted.  ____________________________________________   LABS (all labs ordered are listed, but only abnormal results are displayed)  Labs Reviewed  BASIC METABOLIC PANEL - Abnormal; Notable for the following components:      Result Value   Glucose, Bld 111 (*)    All other components within normal limits  CBC - Abnormal; Notable for the following components:   WBC 11.4 (*)    Hemoglobin 12.9 (*)    MCHC 29.9 (*)    RDW 18.0 (*)    All other components within normal limits  D-DIMER, QUANTITATIVE (NOT AT [facility])  TROPONIN I (HIGH SENSITIVITY)  TROPONIN I (HIGH SENSITIVITY)   ____________________________________________  EKG   EKG Interpretation  Date/Time:  Sunday July 21 2020 22:41:49 EST Ventricular Rate:  79 PR Interval:    QRS Duration: 147 QT Interval:  401 QTC Calculation: 460 R Axis:   -83 Text Interpretation: Sinus rhythm Atrial premature complexes Prolonged PR interval Right bundle branch block Anterolateral infarct, age indeterminate Baseline wander in lead(s) V3 No significant change since last tracing Confirmed by Merrily Pew [phone]) on 07/21/2020 11:03:20 PM      ____________________________________________  RADIOLOGY  DG Chest 2 View  Result Date: 07/21/2020 CLINICAL DATA:  Chest pain EXAM: CHEST - 2 VIEW COMPARISON:  06/11/2020 FINDINGS: Heart and mediastinal contours are within normal limits. No focal opacities or effusions. No acute bony abnormality. IMPRESSION: No  active cardiopulmonary disease. Electronically Signed   By: Rolm Baptise M.D.   On: 07/21/2020 23:57   ____________________________________________  PROCEDURES  Procedure(s) performed:   Procedures ____________________________________________  INITIAL IMPRESSION / ASSESSMENT AND PLAN    This patient presents to the ED for concern of chest pain, this involves an extensive number of treatment options, and is a complaint that carries with it a high risk of complications and morbidity.  The differential diagnosis includes recurrent coronary artery disease, pulmonary embolus, costochondritis, muscle  spasm, indigestion.  First troponin and EKG are unremarkable.  His white blood cell hemoglobin levels are too bad.  Chest x-ray looks similar to baseline.  We will add on a D-dimer.  We will see what these show and discussed with patient and family as far as further treatment/evaluation or need for hospitalisation.  Additional history obtained:   Additional history obtained from daughter, Jeannene Patella, over the phone.  Previous records obtained and reviewed in Epic and noted above  ED Course  Clinical Course as of 07/22/20 0322  Mon Jul 22, 2020  0040 WBC(!): 11.4 [JM]  0040 Hemoglobin(!): 12.9 [JM]  0040 Troponin I (High Sensitivity): 13 [JM]  0040 DG Chest 2 View [JM]  0225 Still no chest pain. Pending second troponin for disposition.  [JM]    Clinical Course User Index [JM] Mesner, Corene Cornea, MD     Medicines ordered:  Medications - No data to display  Consultations Obtained:   I consulted no one and discussed lab and imaging findings   CRITICAL INTERVENTIONS:  . None  Reevaluation:  After the interventions stated above, I reevaluated the patient and found continued chest pain free. tropobnins negative. ECG ok. D dimer ok. Rest of workup unremarkable. I discussed with him and he was ready to go home.  I discussed with his daughter including the possibility of this worsening or  possibility of this being a missed heart attack.  Shared decision making and we decided the patient be better off served being discharged.  Can return easily and quickly if pain or anything else changes.  Can follow-up with primary doctor and cardiologist as needed.  Think it is unlikely that the patient is having a coronary event at this time however is obviously high risk for them.  Will DC to follow-up with outpatient doctors.  FINAL IMPRESSION AND PLAN Final diagnoses:  Nonspecific chest pain    A medical screening exam was performed and I feel the patient has had an appropriate workup for their chief complaint at this time and likelihood of emergent condition existing is low. They have been counseled on decision, DISCHARGE, follow up and which symptoms necessitate immediate return to the emergency department. They or their family verbally stated understanding and agreement with plan and discharged in stable condition.   ____________________________________________   NEW OUTPATIENT MEDICATIONS STARTED DURING THIS VISIT:  New Prescriptions   No medications on file    Note:  This note was prepared with assistance of Dragon voice recognition software. Occasional wrong-word or sound-a-like substitutions may have occurred due to the inherent limitations of voice recognition software.    Mesner, Corene Cornea, MD 07/22/20 [phone]

## 2020-07-22 NOTE — Telephone Encounter (Signed)
Nurses  Please reorder his sublingual nitroglycerin 1 bottle, directions 1 sublingual every 5 minutes as needed maximum 3 in a row, 2 refills  Also order Imdur 30 mg 1 twice daily #60 5 refills  Also forward message to Pam  Hi Pam  I agree-we should try to do everything we can to help manage his symptoms at home.  Catheterization stress testing etc. would not provide any significant change in what we are doing currently.  Obviously if you are having questions concerns issues please feel free to reach out-we will do our best to help  Thanks so much-Dr. Nicki Reaper

## 2020-07-31 ENCOUNTER — Telehealth: Payer: Self-pay

## 2020-07-31 NOTE — Telephone Encounter (Signed)
Phone call placed to [facility] to verify that patient is under service. Patient currently under Palliative care services at [facility]. VM left for Cassandra.

## 2020-08-14 ENCOUNTER — Other Ambulatory Visit: Payer: Self-pay

## 2020-08-14 MED ORDER — MORPHINE SULFATE (CONCENTRATE) 20 MG/ML PO SOLN: 5.0000 mg | ORAL | 0 refills | Status: AC | PRN

## 2020-08-14 NOTE — Telephone Encounter (Signed)
Kim with Hospices needs to get morphine sent to the home send order to [place] call back [phone]

## 2020-08-14 NOTE — Telephone Encounter (Signed)
Nurses Please connect with hospice nurse.  Go ahead and pend prescription to [facility] regarding morphine.(Please use order in dosage that they recommend per their protocol)

## 2020-08-14 NOTE — Telephone Encounter (Signed)
Kim at [facility] recommends Morphine 20mg /ml 5 mg every 2 hours as needed for pain Med pended( need to add quantity)

## 2020-08-20 ENCOUNTER — Encounter: Payer: Self-pay | Admitting: Family Medicine

## 2020-08-28 DIAGNOSIS — E1165 Type 2 diabetes mellitus with hyperglycemia: Secondary | ICD-10-CM | POA: Diagnosis not present

## 2020-08-31 ENCOUNTER — Encounter: Payer: Self-pay | Admitting: Family Medicine

## 2020-09-13 DEATH — deceased

## 2021-02-26 IMAGING — DX DG CHEST 1V PORT
1 series · 1 of 1 positions shown · non-contrast
Comparison: 07/24/2017

CLINICAL DATA: Left lower chest pain beginning this morning.

EXAM:
PORTABLE CHEST 1 VIEW

[chest ap grid]
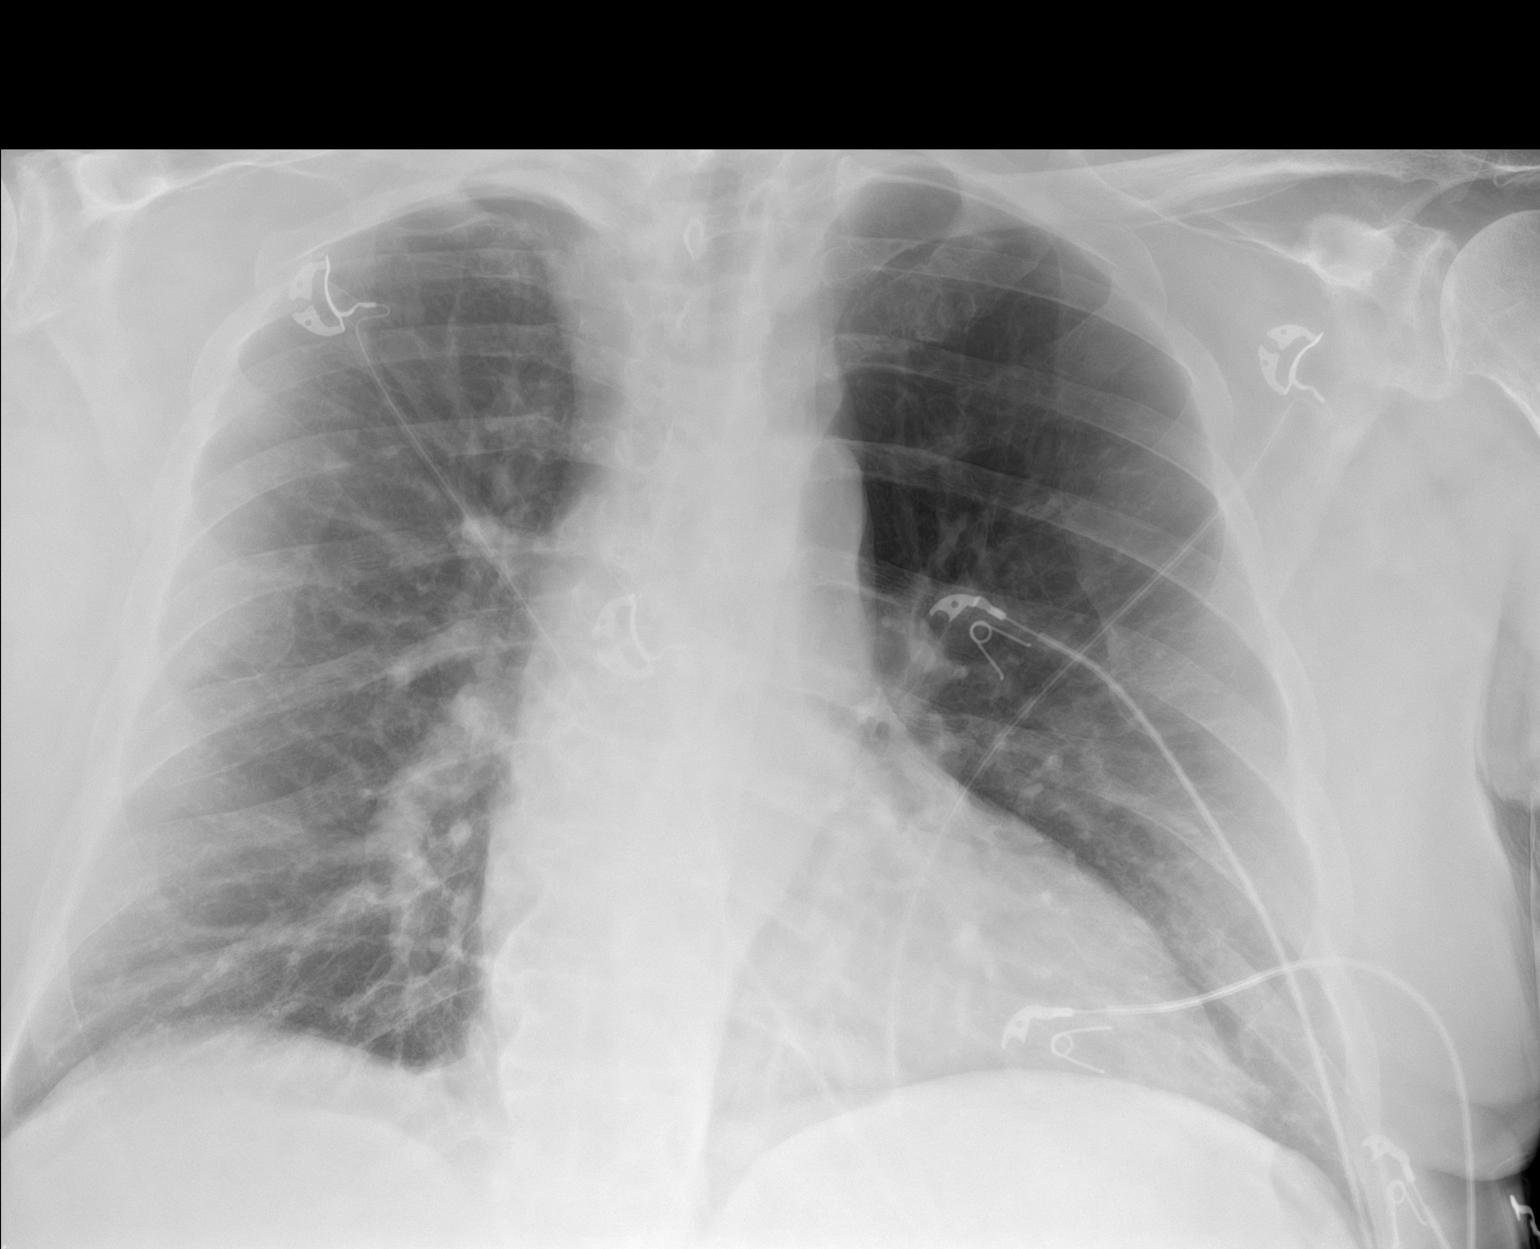

[1 of 1 positions shown; findings below may reference images not displayed]

FINDINGS: Cardiac silhouette is normal in size. No mediastinal or hilar
masses. No evidence of adenopathy.

Clear lungs.  No pleural effusion or pneumothorax.

Skeletal structures are grossly intact.
IMPRESSION: No active disease.
